# Patient Record
Sex: Male | Born: 1945 | Race: White | Hispanic: No | Marital: Married | Smoking: Former smoker
Health system: Southern US, Community
[De-identification: ages and names within clinical notes are randomized; demographics above are authoritative.]

## PROBLEM LIST (undated history)

## (undated) DIAGNOSIS — Z8042 Family history of malignant neoplasm of prostate: Secondary | ICD-10-CM

## (undated) DIAGNOSIS — D751 Secondary polycythemia: Secondary | ICD-10-CM

## (undated) DIAGNOSIS — I10 Essential (primary) hypertension: Secondary | ICD-10-CM

## (undated) DIAGNOSIS — F419 Anxiety disorder, unspecified: Secondary | ICD-10-CM

## (undated) DIAGNOSIS — Z973 Presence of spectacles and contact lenses: Secondary | ICD-10-CM

## (undated) DIAGNOSIS — M1990 Unspecified osteoarthritis, unspecified site: Secondary | ICD-10-CM

## (undated) DIAGNOSIS — Z8052 Family history of malignant neoplasm of bladder: Secondary | ICD-10-CM

## (undated) DIAGNOSIS — J439 Emphysema, unspecified: Secondary | ICD-10-CM

## (undated) DIAGNOSIS — C61 Malignant neoplasm of prostate: Secondary | ICD-10-CM

## (undated) DIAGNOSIS — C649 Malignant neoplasm of unspecified kidney, except renal pelvis: Secondary | ICD-10-CM

## (undated) DIAGNOSIS — Z974 Presence of external hearing-aid: Secondary | ICD-10-CM

## (undated) DIAGNOSIS — K5792 Diverticulitis of intestine, part unspecified, without perforation or abscess without bleeding: Secondary | ICD-10-CM

## (undated) HISTORY — DX: Essential (primary) hypertension: I10

## (undated) HISTORY — DX: Family history of malignant neoplasm of prostate: Z80.42

## (undated) HISTORY — DX: Family history of malignant neoplasm of bladder: Z80.52

## (undated) HISTORY — PX: COLONOSCOPY: SHX174

## (undated) HISTORY — DX: Malignant neoplasm of prostate: C61

## (undated) HISTORY — DX: Secondary polycythemia: D75.1

## (undated) HISTORY — DX: Diverticulitis of intestine, part unspecified, without perforation or abscess without bleeding: K57.92

---

## 1998-01-21 ENCOUNTER — Ambulatory Visit (HOSPITAL_COMMUNITY): Admission: RE | Admit: 1998-01-21 | Discharge: 1998-01-21 | Payer: Self-pay | Admitting: Family Medicine

## 1999-02-03 ENCOUNTER — Encounter: Admission: RE | Admit: 1999-02-03 | Discharge: 1999-02-03 | Payer: Self-pay | Admitting: Family Medicine

## 1999-02-05 ENCOUNTER — Encounter: Payer: Self-pay | Admitting: Family Medicine

## 1999-02-05 ENCOUNTER — Encounter: Admission: RE | Admit: 1999-02-05 | Discharge: 1999-02-05 | Payer: Self-pay | Admitting: Family Medicine

## 1999-02-07 ENCOUNTER — Encounter: Payer: Self-pay | Admitting: Family Medicine

## 1999-02-07 ENCOUNTER — Encounter: Admission: RE | Admit: 1999-02-07 | Discharge: 1999-02-07 | Payer: Self-pay | Admitting: Family Medicine

## 1999-06-30 ENCOUNTER — Encounter: Admission: RE | Admit: 1999-06-30 | Discharge: 1999-06-30 | Payer: Self-pay | Admitting: Neurosurgery

## 1999-06-30 ENCOUNTER — Encounter: Payer: Self-pay | Admitting: Neurosurgery

## 2004-03-16 HISTORY — PX: RADIOACTIVE SEED IMPLANT: SHX5150

## 2004-08-20 ENCOUNTER — Ambulatory Visit: Admission: RE | Admit: 2004-08-20 | Discharge: 2004-11-18 | Payer: Self-pay | Admitting: Radiation Oncology

## 2004-08-25 ENCOUNTER — Encounter: Admission: RE | Admit: 2004-08-25 | Discharge: 2004-08-25 | Payer: Self-pay | Admitting: Urology

## 2004-10-08 ENCOUNTER — Ambulatory Visit (HOSPITAL_BASED_OUTPATIENT_CLINIC_OR_DEPARTMENT_OTHER): Admission: RE | Admit: 2004-10-08 | Discharge: 2004-10-08 | Payer: Self-pay | Admitting: Urology

## 2004-10-08 ENCOUNTER — Ambulatory Visit (HOSPITAL_COMMUNITY): Admission: RE | Admit: 2004-10-08 | Discharge: 2004-10-08 | Payer: Self-pay | Admitting: Urology

## 2008-03-16 HISTORY — PX: HERNIA REPAIR: SHX51

## 2008-04-24 ENCOUNTER — Encounter: Admission: RE | Admit: 2008-04-24 | Discharge: 2008-04-24 | Payer: Self-pay | Admitting: General Surgery

## 2008-04-27 ENCOUNTER — Encounter (INDEPENDENT_AMBULATORY_CARE_PROVIDER_SITE_OTHER): Payer: Self-pay | Admitting: General Surgery

## 2008-04-27 ENCOUNTER — Ambulatory Visit (HOSPITAL_BASED_OUTPATIENT_CLINIC_OR_DEPARTMENT_OTHER): Admission: RE | Admit: 2008-04-27 | Discharge: 2008-04-27 | Payer: Self-pay | Admitting: General Surgery

## 2010-05-20 ENCOUNTER — Encounter (HOSPITAL_BASED_OUTPATIENT_CLINIC_OR_DEPARTMENT_OTHER): Payer: Federal, State, Local not specified - PPO | Admitting: Oncology

## 2010-05-20 ENCOUNTER — Other Ambulatory Visit: Payer: Self-pay | Admitting: Oncology

## 2010-05-20 ENCOUNTER — Encounter: Payer: Self-pay | Admitting: Oncology

## 2010-05-20 DIAGNOSIS — D751 Secondary polycythemia: Secondary | ICD-10-CM

## 2010-05-20 DIAGNOSIS — C61 Malignant neoplasm of prostate: Secondary | ICD-10-CM

## 2010-05-20 LAB — CBC WITH DIFFERENTIAL/PLATELET
BASO%: 0.9 % (ref 0.0–2.0)
Basophils Absolute: 0 10*3/uL (ref 0.0–0.1)
EOS%: 1.8 % (ref 0.0–7.0)
Eosinophils Absolute: 0.1 10*3/uL (ref 0.0–0.5)
HCT: 56.9 % — ABNORMAL HIGH (ref 38.4–49.9)
HGB: 19.4 g/dL — ABNORMAL HIGH (ref 13.0–17.1)
LYMPH%: 21.7 % (ref 14.0–49.0)
MCH: 29 pg (ref 27.2–33.4)
MCHC: 34.1 g/dL (ref 32.0–36.0)
MCV: 85.2 fL (ref 79.3–98.0)
MONO#: 0.3 10*3/uL (ref 0.1–0.9)
MONO%: 6.1 % (ref 0.0–14.0)
NEUT#: 3.4 10*3/uL (ref 1.5–6.5)
NEUT%: 69.5 % (ref 39.0–75.0)
Platelets: 151 10*3/uL (ref 140–400)
RBC: 6.68 10*6/uL — ABNORMAL HIGH (ref 4.20–5.82)
RDW: 13.2 % (ref 11.0–14.6)
WBC: 4.9 10*3/uL (ref 4.0–10.3)
lymph#: 1.1 10*3/uL (ref 0.9–3.3)

## 2010-05-20 LAB — CHCC SMEAR

## 2010-05-21 LAB — COMPREHENSIVE METABOLIC PANEL
ALT: 23 U/L (ref 0–53)
AST: 22 U/L (ref 0–37)
Alkaline Phosphatase: 53 U/L (ref 39–117)
BUN: 16 mg/dL (ref 6–23)
Calcium: 9.2 mg/dL (ref 8.4–10.5)
Chloride: 103 mEq/L (ref 96–112)
Creatinine, Ser: 1.1 mg/dL (ref 0.40–1.50)
Total Bilirubin: 0.7 mg/dL (ref 0.3–1.2)

## 2010-05-23 LAB — JAK-2 V617F

## 2010-06-10 ENCOUNTER — Encounter (HOSPITAL_BASED_OUTPATIENT_CLINIC_OR_DEPARTMENT_OTHER): Payer: Federal, State, Local not specified - PPO | Admitting: Oncology

## 2010-06-10 DIAGNOSIS — D751 Secondary polycythemia: Secondary | ICD-10-CM

## 2010-07-01 LAB — DIFFERENTIAL
Basophils Absolute: 0 10*3/uL (ref 0.0–0.1)
Basophils Relative: 0 % (ref 0–1)
Monocytes Relative: 9 % (ref 3–12)
Neutro Abs: 3.9 10*3/uL (ref 1.7–7.7)
Neutrophils Relative %: 68 % (ref 43–77)

## 2010-07-01 LAB — URINE MICROSCOPIC-ADD ON

## 2010-07-01 LAB — URINALYSIS, ROUTINE W REFLEX MICROSCOPIC
Leukocytes, UA: NEGATIVE
Nitrite: NEGATIVE
Specific Gravity, Urine: 1.016 (ref 1.005–1.030)
Urobilinogen, UA: 0.2 mg/dL (ref 0.0–1.0)

## 2010-07-01 LAB — COMPREHENSIVE METABOLIC PANEL
Alkaline Phosphatase: 53 U/L (ref 39–117)
BUN: 12 mg/dL (ref 6–23)
CO2: 26 mEq/L (ref 19–32)
GFR calc non Af Amer: >60 mL/min (ref >60)
Glucose, Bld: 102 mg/dL — ABNORMAL HIGH (ref 70–99)
Potassium: 3.9 mEq/L (ref 3.5–5.1)
Total Bilirubin: 1 mg/dL (ref 0.3–1.2)
Total Protein: 6.7 g/dL (ref 6.0–8.3)

## 2010-07-01 LAB — CBC
HCT: 50 % (ref 39.0–52.0)
Hemoglobin: 17.5 g/dL — ABNORMAL HIGH (ref 13.0–17.0)
RDW: 13 % (ref 11.5–15.5)

## 2010-07-29 NOTE — Op Note (Signed)
NAMEMOHIT, ZIRBES NO.:  [id number]   MEDICAL RECORD NO.:  [id number]          PATIENT TYPE:  AMB   LOCATION:  [facility]SC                          FACILITY:  [facility]   PHYSICIAN:  Angelia Mould. Derrell Lolling, M.D.DATE OF BIRTH:  1946-01-09   DATE OF PROCEDURE:  04/27/2008  DATE OF DISCHARGE:                               OPERATIVE REPORT   PREOPERATIVE DIAGNOSES:  1. Left inguinal hernia.  2. Large left hydrocele.   POSTOPERATIVE DIAGNOSES:  1. Left inguinal hernia.  2. Large left hydrocele.   OPERATIONS PERFORMED:  1. Repair of left inguinal hernia with UltraPro mesh Press photographer).  2. Left hydrocelectomy.   SURGEON:  Angelia Mould. Derrell Lolling, MD   OPERATIVE INDICATIONS:  This is a 65 year old white man who has had a  left inguinal hernia for years and also has had a very large left  hydrocele.  Recently the left inguinal hernia has been getting larger  and has become painful.  I was asked to evaluate him about 1 week ago.  On exam, he has a very large left hydrocele and also has a separate left  inguinal hernia that is reducible.  There are no other hernias that I  can detect elsewhere.  He was brought to the operating room electively.   OPERATIVE TECHNIQUE:  Following the induction of general endotracheal  anesthesia, the patient's lower abdomen, groin, and genitalia were  prepped and draped in sterile fashion.  Intravenous antibiotics were  given.  A surgical time-out was held identifying the correct patient,  correct procedure, and correct site.  Marcaine 0.5% with epinephrine was  used as a local infiltration anesthetic.  Transverse incision was made  overlying the left inguinal area.  Dissection was carried down through  the subcutaneous tissue exposing the aponeurosis of the external  oblique.  The external oblique was incised in the direction of its  fibers opening of the external inguinal ring.  The external oblique was  dissected away from the  underlying structures and self-retaining  retractors were placed.  There was a sensory nerve that  was intimately  involved with the cord structures, which was traced back and clamped  laterally, divided, and ligated with a 2-0 silk tie.   We then dissected all the cord structures free and encircled them with a  Penrose drain.  I found that he had a moderate-sized direct left  inguinal hernia, but there was no evidence of indirect hernia.   I dissected down toward the testicle until I could deliver the testicle  and a large hydrocele into the wound.  I opened up the wall of the  hydrocele sac and then carefully debrided as much of the sac as possible  and sent it for histology.  This was done with electrocautery and there  was really no bleeding.  I folded the remnants of the walls of the  hydrocele sac back around the cord structures and testicle and then  sewed them to each other with a running suture of 2-0 Vicryl.  The  testicle looked fine.  I returned the testicle  and the cord back to the  scrotum.  The wound was irrigated.  There was no bleeding.   The direct inguinal hernia was dissected away from the cord structures  and reduced.  The floor of the inguinal canal was oversewn with a  running suture of 2-0 Vicryl to hold the direct hernia reduced during  the repair.  I then skeletonized the cremasteric muscle fibers around  the cord and searched for an indirect sac and there was no evidence of  indirect sac.  With retraction, I could see the edge of the peritoneum  just barely at the level of the internal ring.  The floor of the  inguinal canal was repaired and reinforced with an onlay graft with  UltraPro polyester mesh.  A 3-inch x 6-inch piece of mesh was brought to  the operative field and trimmed at the corners to accommodate the wound.  The mesh was sutured in place with running sutures and interrupted  mattress sutures of 2-0 Prolene.  The mesh was sutured so as to   generously overlap the fascia at the pubic tubercle along the inguinal  ligament inferiorly.  Medially and superomedially about 6 or 7  interrupted mattress sutures of 2-0 Prolene were placed to fix the mesh.  Superolaterally, a running suture of 2-0 Prolene was used.  The mesh was  incised laterally so as to wrap around the cord structures at the  internal ring.  The tails of the mesh were overlapped laterally and the  suture lines completed.  Another mattress suture of 2-0 Prolene was  placed just lateral to the cord structures to tighten the mesh up around  the internal ring.  This provided a very secure repair both medial and  lateral to the internal ring, but allowed an adequate fingertip opening  for the cord structures.  The wound was irrigated with saline.  There  was absolutely no bleeding.  The external oblique was closed with a  running suture of 2-0 Vicryl placing the cord structures deep to the  external oblique.  The penis and scrotum were checked.  The left  testicle was completely reduced back into the bottom of the scrotum.  Scarpa's fascia was closed with 3-0 Vicryl sutures and the skin closed  with a running subcuticular suture of 4-0 Monocryl and Steri-Strips.  Clean bandages were placed and the patient was taken to recovery room in  stable condition.  Estimated blood loss was about 10-15 mL.   COMPLICATIONS:  None.   Sponge, needle, and instrument counts were correct.      Angelia Mould. Derrell Lolling, M.D.  Electronically Signed     HMI/MEDQ  D:  04/27/2008  T:  04/28/2008  Job:  [id number]   cc:   Windy Fast L. Earlene Plater, M.D.  [name] A. Nicholos Johns, M.D.

## 2010-08-01 NOTE — Op Note (Signed)
[name], [name]              ACCOUNT NO.:  [id number]   MEDICAL RECORD NO.:  [id number]          PATIENT TYPE:  AMB   LOCATION:  [facility]                         FACILITY:  [facility]   PHYSICIAN:  Ronald L. Earlene Plater, M.D.  DATE OF BIRTH:  March 30, 1945   DATE OF PROCEDURE:  10/08/2004  DATE OF DISCHARGE:                                 OPERATIVE REPORT   DIAGNOSIS:  Adenocarcinoma of the prostate.   OPERATIVE PROCEDURE:  Transperineal implantation of iodine 125 seeds  utilizing a robotic arm Nucletron and cystourethroscopy.   SURGEON:  Lucrezia Starch. Earlene Plater, M.D.   ASSISTANT:  Maryln Gottron, M.D.   ANESTHESIA:  General endotracheal.   ESTIMATED BLOOD LOSS:  15 cc.   TUBES:  16-French Foley.   COMPLICATIONS:  None.   A total of 23 needles were used to implant 45 seeds at 0.533 millicuries per  seed.   INDICATIONS:  Mr. Pratte is a very nice 65 year old white male who  presented with an elevated PSA of 4.97. He underwent ultrasound and biopsy  of the prostate which revealed a Gleason score of 6 which was 3 + 3  adenocarcinoma in 10% of biopsies from the right side of his prostate. He  has considered options carefully, and after understanding risks, benefits  and alternatives, he would like to proceed with radioactive seed  implantation. He has been properly simulated and properly informed.   PROCEDURE IN DETAIL:  The patient was placed in a supine position. After  proper LMA anesthesia, was placed in the dorsal lithotomy position, prepped  and draped with Betadine in a sterile fashion. A 16-French Foley catheter  was inserted, and the bladder was drained. The B&K transrectal ultrasound  probe was placed, and both axial and sagittal scans were utilized.  Intraoperative planning was performed. Those will be documented in the  record and augmentation of pre-implantation was performed. Two holding  needles were placed in unused coordinates, and the initial needle was used  in the  Nucletron planned implantation based upon this. Utilizing ultrasound  guidance in the sagittal plane, serial implantation was performed. A total  of 23 needles were utilized to implant 45 seeds at 0.533 millicuries per  seed. The automatic implantation device was utilized. Seeds were visualized,  and on ultrasound, it might be noted that two holding needles had been  placed in unused coordinates. There was some bone in the left side  anteriorly, and one needle as been documented in the chart had to be free  hand. Following implantation utilizing the grid system, the device was  removed. KUB was performed, and we were quite pleased with the localization  of the seeds. He was placed in the supine position after the wound had been  dressed sterilely. The 16-French Foley was removed and scanned for seeds,  and there were none within it. Flexible cystourethroscopy was performed. He  was  noted to have mild trilobar hypertrophy, smooth wall bladder, efflux of  clear urine noted bilaterally with normally placed ureteral orifices, and  there were no seeds or spacers in the bladder or the urethra. Cystoscope was  visually removed, and a new 16-French Foley catheter was passed. The patient  was taken to the recovery room, stable.       RLD/MEDQ  D:  10/08/2004  T:  10/08/2004  Job:  [id number]

## 2010-09-26 ENCOUNTER — Encounter (HOSPITAL_BASED_OUTPATIENT_CLINIC_OR_DEPARTMENT_OTHER): Payer: Federal, State, Local not specified - PPO | Admitting: Oncology

## 2010-09-26 ENCOUNTER — Other Ambulatory Visit: Payer: Self-pay | Admitting: Oncology

## 2010-09-26 DIAGNOSIS — D751 Secondary polycythemia: Secondary | ICD-10-CM

## 2010-09-26 LAB — CBC WITH DIFFERENTIAL/PLATELET
Basophils Absolute: 0 10*3/uL (ref 0.0–0.1)
EOS%: 2.1 % (ref 0.0–7.0)
HCT: 51.7 % — ABNORMAL HIGH (ref 38.4–49.9)
MCHC: 34.9 g/dL (ref 32.0–36.0)
MCV: 85.5 fL (ref 79.3–98.0)
MONO#: 0.5 10*3/uL (ref 0.1–0.9)
Platelets: 203 10*3/uL (ref 140–400)
RBC: 6.05 10*6/uL — ABNORMAL HIGH (ref 4.20–5.82)
lymph#: 1.3 10*3/uL (ref 0.9–3.3)

## 2011-02-07 ENCOUNTER — Telehealth: Payer: Self-pay | Admitting: Oncology

## 2011-02-07 NOTE — Telephone Encounter (Signed)
S/w the pt regarding his jan 2013 appts °

## 2011-03-30 ENCOUNTER — Encounter: Payer: Self-pay | Admitting: *Deleted

## 2011-04-02 ENCOUNTER — Ambulatory Visit: Payer: Federal, State, Local not specified - PPO | Admitting: Oncology

## 2011-04-02 ENCOUNTER — Other Ambulatory Visit: Payer: Federal, State, Local not specified - PPO | Admitting: Lab

## 2011-09-21 DIAGNOSIS — M67919 Unspecified disorder of synovium and tendon, unspecified shoulder: Secondary | ICD-10-CM | POA: Diagnosis not present

## 2011-11-02 ENCOUNTER — Other Ambulatory Visit: Payer: Self-pay | Admitting: Dermatology

## 2011-11-02 DIAGNOSIS — L570 Actinic keratosis: Secondary | ICD-10-CM | POA: Diagnosis not present

## 2011-11-02 DIAGNOSIS — L821 Other seborrheic keratosis: Secondary | ICD-10-CM | POA: Diagnosis not present

## 2011-11-02 DIAGNOSIS — D485 Neoplasm of uncertain behavior of skin: Secondary | ICD-10-CM | POA: Diagnosis not present

## 2011-11-02 DIAGNOSIS — L820 Inflamed seborrheic keratosis: Secondary | ICD-10-CM | POA: Diagnosis not present

## 2011-11-19 DIAGNOSIS — Z23 Encounter for immunization: Secondary | ICD-10-CM | POA: Diagnosis not present

## 2011-11-19 DIAGNOSIS — Z125 Encounter for screening for malignant neoplasm of prostate: Secondary | ICD-10-CM | POA: Diagnosis not present

## 2011-11-19 DIAGNOSIS — M545 Low back pain, unspecified: Secondary | ICD-10-CM | POA: Diagnosis not present

## 2011-11-19 DIAGNOSIS — E785 Hyperlipidemia, unspecified: Secondary | ICD-10-CM | POA: Diagnosis not present

## 2011-11-19 DIAGNOSIS — Z0000 Encounter for general adult medical examination without abnormal findings: Secondary | ICD-10-CM | POA: Diagnosis not present

## 2011-11-19 DIAGNOSIS — C61 Malignant neoplasm of prostate: Secondary | ICD-10-CM | POA: Diagnosis not present

## 2011-11-19 DIAGNOSIS — H9190 Unspecified hearing loss, unspecified ear: Secondary | ICD-10-CM | POA: Diagnosis not present

## 2011-11-19 DIAGNOSIS — I10 Essential (primary) hypertension: Secondary | ICD-10-CM | POA: Diagnosis not present

## 2011-11-19 DIAGNOSIS — Z136 Encounter for screening for cardiovascular disorders: Secondary | ICD-10-CM | POA: Diagnosis not present

## 2011-11-27 DIAGNOSIS — Z87891 Personal history of nicotine dependence: Secondary | ICD-10-CM | POA: Diagnosis not present

## 2011-12-04 DIAGNOSIS — H9319 Tinnitus, unspecified ear: Secondary | ICD-10-CM | POA: Diagnosis not present

## 2011-12-04 DIAGNOSIS — H903 Sensorineural hearing loss, bilateral: Secondary | ICD-10-CM | POA: Diagnosis not present

## 2011-12-11 DIAGNOSIS — H903 Sensorineural hearing loss, bilateral: Secondary | ICD-10-CM | POA: Diagnosis not present

## 2011-12-14 DIAGNOSIS — D235 Other benign neoplasm of skin of trunk: Secondary | ICD-10-CM | POA: Diagnosis not present

## 2011-12-14 DIAGNOSIS — L578 Other skin changes due to chronic exposure to nonionizing radiation: Secondary | ICD-10-CM | POA: Diagnosis not present

## 2011-12-31 DIAGNOSIS — Z23 Encounter for immunization: Secondary | ICD-10-CM | POA: Diagnosis not present

## 2012-01-01 DIAGNOSIS — M719 Bursopathy, unspecified: Secondary | ICD-10-CM | POA: Diagnosis not present

## 2012-01-07 DIAGNOSIS — M19019 Primary osteoarthritis, unspecified shoulder: Secondary | ICD-10-CM | POA: Diagnosis not present

## 2012-01-11 DIAGNOSIS — M67919 Unspecified disorder of synovium and tendon, unspecified shoulder: Secondary | ICD-10-CM | POA: Diagnosis not present

## 2012-01-11 DIAGNOSIS — M719 Bursopathy, unspecified: Secondary | ICD-10-CM | POA: Diagnosis not present

## 2012-01-14 ENCOUNTER — Other Ambulatory Visit: Payer: Self-pay | Admitting: Gastroenterology

## 2012-01-14 DIAGNOSIS — Z1211 Encounter for screening for malignant neoplasm of colon: Secondary | ICD-10-CM | POA: Diagnosis not present

## 2012-01-14 DIAGNOSIS — K5730 Diverticulosis of large intestine without perforation or abscess without bleeding: Secondary | ICD-10-CM | POA: Diagnosis not present

## 2012-01-14 DIAGNOSIS — D126 Benign neoplasm of colon, unspecified: Secondary | ICD-10-CM | POA: Diagnosis not present

## 2012-01-15 HISTORY — PX: SHOULDER ARTHROSCOPY: SHX128

## 2012-01-19 DIAGNOSIS — M719 Bursopathy, unspecified: Secondary | ICD-10-CM | POA: Diagnosis not present

## 2012-01-19 DIAGNOSIS — S43429A Sprain of unspecified rotator cuff capsule, initial encounter: Secondary | ICD-10-CM | POA: Diagnosis not present

## 2012-01-19 DIAGNOSIS — G8918 Other acute postprocedural pain: Secondary | ICD-10-CM | POA: Diagnosis not present

## 2012-01-19 DIAGNOSIS — M19019 Primary osteoarthritis, unspecified shoulder: Secondary | ICD-10-CM | POA: Diagnosis not present

## 2012-01-19 DIAGNOSIS — M24119 Other articular cartilage disorders, unspecified shoulder: Secondary | ICD-10-CM | POA: Diagnosis not present

## 2012-01-19 DIAGNOSIS — M67919 Unspecified disorder of synovium and tendon, unspecified shoulder: Secondary | ICD-10-CM | POA: Diagnosis not present

## 2012-01-29 DIAGNOSIS — M25519 Pain in unspecified shoulder: Secondary | ICD-10-CM | POA: Diagnosis not present

## 2012-01-29 DIAGNOSIS — M19019 Primary osteoarthritis, unspecified shoulder: Secondary | ICD-10-CM | POA: Diagnosis not present

## 2012-02-04 DIAGNOSIS — M25519 Pain in unspecified shoulder: Secondary | ICD-10-CM | POA: Diagnosis not present

## 2012-02-04 DIAGNOSIS — M19019 Primary osteoarthritis, unspecified shoulder: Secondary | ICD-10-CM | POA: Diagnosis not present

## 2012-02-15 DIAGNOSIS — M25519 Pain in unspecified shoulder: Secondary | ICD-10-CM | POA: Diagnosis not present

## 2012-02-15 DIAGNOSIS — M19019 Primary osteoarthritis, unspecified shoulder: Secondary | ICD-10-CM | POA: Diagnosis not present

## 2012-02-22 DIAGNOSIS — M19019 Primary osteoarthritis, unspecified shoulder: Secondary | ICD-10-CM | POA: Diagnosis not present

## 2012-02-29 DIAGNOSIS — M25519 Pain in unspecified shoulder: Secondary | ICD-10-CM | POA: Diagnosis not present

## 2012-02-29 DIAGNOSIS — M19019 Primary osteoarthritis, unspecified shoulder: Secondary | ICD-10-CM | POA: Diagnosis not present

## 2012-03-10 DIAGNOSIS — M19019 Primary osteoarthritis, unspecified shoulder: Secondary | ICD-10-CM | POA: Diagnosis not present

## 2012-03-14 DIAGNOSIS — M19019 Primary osteoarthritis, unspecified shoulder: Secondary | ICD-10-CM | POA: Diagnosis not present

## 2012-03-14 DIAGNOSIS — M25519 Pain in unspecified shoulder: Secondary | ICD-10-CM | POA: Diagnosis not present

## 2012-03-17 DIAGNOSIS — M25519 Pain in unspecified shoulder: Secondary | ICD-10-CM | POA: Diagnosis not present

## 2012-03-17 DIAGNOSIS — M19019 Primary osteoarthritis, unspecified shoulder: Secondary | ICD-10-CM | POA: Diagnosis not present

## 2012-03-21 DIAGNOSIS — M19019 Primary osteoarthritis, unspecified shoulder: Secondary | ICD-10-CM | POA: Diagnosis not present

## 2012-03-21 DIAGNOSIS — M25519 Pain in unspecified shoulder: Secondary | ICD-10-CM | POA: Diagnosis not present

## 2012-03-24 DIAGNOSIS — M25519 Pain in unspecified shoulder: Secondary | ICD-10-CM | POA: Diagnosis not present

## 2012-03-24 DIAGNOSIS — M19019 Primary osteoarthritis, unspecified shoulder: Secondary | ICD-10-CM | POA: Diagnosis not present

## 2012-03-28 DIAGNOSIS — M19019 Primary osteoarthritis, unspecified shoulder: Secondary | ICD-10-CM | POA: Diagnosis not present

## 2012-03-28 DIAGNOSIS — M25519 Pain in unspecified shoulder: Secondary | ICD-10-CM | POA: Diagnosis not present

## 2012-03-31 DIAGNOSIS — M19019 Primary osteoarthritis, unspecified shoulder: Secondary | ICD-10-CM | POA: Diagnosis not present

## 2012-03-31 DIAGNOSIS — M25519 Pain in unspecified shoulder: Secondary | ICD-10-CM | POA: Diagnosis not present

## 2012-04-04 DIAGNOSIS — M19019 Primary osteoarthritis, unspecified shoulder: Secondary | ICD-10-CM | POA: Diagnosis not present

## 2012-04-04 DIAGNOSIS — M25519 Pain in unspecified shoulder: Secondary | ICD-10-CM | POA: Diagnosis not present

## 2012-04-07 DIAGNOSIS — M19019 Primary osteoarthritis, unspecified shoulder: Secondary | ICD-10-CM | POA: Diagnosis not present

## 2012-04-07 DIAGNOSIS — M25519 Pain in unspecified shoulder: Secondary | ICD-10-CM | POA: Diagnosis not present

## 2012-04-25 DIAGNOSIS — M19019 Primary osteoarthritis, unspecified shoulder: Secondary | ICD-10-CM | POA: Diagnosis not present

## 2012-04-25 DIAGNOSIS — M75120 Complete rotator cuff tear or rupture of unspecified shoulder, not specified as traumatic: Secondary | ICD-10-CM | POA: Diagnosis not present

## 2012-05-02 DIAGNOSIS — M19019 Primary osteoarthritis, unspecified shoulder: Secondary | ICD-10-CM | POA: Diagnosis not present

## 2012-05-02 DIAGNOSIS — M75120 Complete rotator cuff tear or rupture of unspecified shoulder, not specified as traumatic: Secondary | ICD-10-CM | POA: Diagnosis not present

## 2012-05-09 DIAGNOSIS — M75120 Complete rotator cuff tear or rupture of unspecified shoulder, not specified as traumatic: Secondary | ICD-10-CM | POA: Diagnosis not present

## 2012-05-09 DIAGNOSIS — M25519 Pain in unspecified shoulder: Secondary | ICD-10-CM | POA: Diagnosis not present

## 2012-05-09 DIAGNOSIS — M7500 Adhesive capsulitis of unspecified shoulder: Secondary | ICD-10-CM | POA: Diagnosis not present

## 2012-05-16 DIAGNOSIS — M25519 Pain in unspecified shoulder: Secondary | ICD-10-CM | POA: Diagnosis not present

## 2012-05-16 DIAGNOSIS — S4980XA Other specified injuries of shoulder and upper arm, unspecified arm, initial encounter: Secondary | ICD-10-CM | POA: Diagnosis not present

## 2012-05-16 DIAGNOSIS — I10 Essential (primary) hypertension: Secondary | ICD-10-CM | POA: Diagnosis not present

## 2012-05-16 DIAGNOSIS — S46909A Unspecified injury of unspecified muscle, fascia and tendon at shoulder and upper arm level, unspecified arm, initial encounter: Secondary | ICD-10-CM | POA: Diagnosis not present

## 2012-05-16 DIAGNOSIS — E785 Hyperlipidemia, unspecified: Secondary | ICD-10-CM | POA: Diagnosis not present

## 2012-05-23 DIAGNOSIS — M25519 Pain in unspecified shoulder: Secondary | ICD-10-CM | POA: Diagnosis not present

## 2012-05-23 DIAGNOSIS — M7500 Adhesive capsulitis of unspecified shoulder: Secondary | ICD-10-CM | POA: Diagnosis not present

## 2012-05-30 DIAGNOSIS — M25519 Pain in unspecified shoulder: Secondary | ICD-10-CM | POA: Diagnosis not present

## 2012-05-30 DIAGNOSIS — M7500 Adhesive capsulitis of unspecified shoulder: Secondary | ICD-10-CM | POA: Diagnosis not present

## 2012-06-06 DIAGNOSIS — M7500 Adhesive capsulitis of unspecified shoulder: Secondary | ICD-10-CM | POA: Diagnosis not present

## 2012-06-06 DIAGNOSIS — M25519 Pain in unspecified shoulder: Secondary | ICD-10-CM | POA: Diagnosis not present

## 2012-06-20 DIAGNOSIS — M7500 Adhesive capsulitis of unspecified shoulder: Secondary | ICD-10-CM | POA: Diagnosis not present

## 2012-06-20 DIAGNOSIS — M25519 Pain in unspecified shoulder: Secondary | ICD-10-CM | POA: Diagnosis not present

## 2012-11-24 DIAGNOSIS — Z0000 Encounter for general adult medical examination without abnormal findings: Secondary | ICD-10-CM | POA: Diagnosis not present

## 2012-11-24 DIAGNOSIS — C61 Malignant neoplasm of prostate: Secondary | ICD-10-CM | POA: Diagnosis not present

## 2012-11-24 DIAGNOSIS — E785 Hyperlipidemia, unspecified: Secondary | ICD-10-CM | POA: Diagnosis not present

## 2012-11-24 DIAGNOSIS — I10 Essential (primary) hypertension: Secondary | ICD-10-CM | POA: Diagnosis not present

## 2012-12-16 DIAGNOSIS — Z23 Encounter for immunization: Secondary | ICD-10-CM | POA: Diagnosis not present

## 2013-05-22 DIAGNOSIS — M720 Palmar fascial fibromatosis [Dupuytren]: Secondary | ICD-10-CM | POA: Diagnosis not present

## 2013-05-22 DIAGNOSIS — E785 Hyperlipidemia, unspecified: Secondary | ICD-10-CM | POA: Diagnosis not present

## 2013-05-22 DIAGNOSIS — I10 Essential (primary) hypertension: Secondary | ICD-10-CM | POA: Diagnosis not present

## 2013-05-31 ENCOUNTER — Telehealth: Payer: Self-pay | Admitting: Oncology

## 2013-05-31 NOTE — Telephone Encounter (Signed)
C/D 05/31/13 for appt.06/15/13

## 2013-05-31 NOTE — Telephone Encounter (Signed)
S/W PATIENT AND GAVE NEW PATIENT APPT FOR 04/02 @ 10:30 W/DR. SHADAD.  REFERRING DR. [name] [name] DX- KNOWN H/O POLYCYTHEMIA VERA WELCOME PACKET MAILED.

## 2013-06-09 ENCOUNTER — Other Ambulatory Visit: Payer: Self-pay | Admitting: Oncology

## 2013-06-09 DIAGNOSIS — M779 Enthesopathy, unspecified: Secondary | ICD-10-CM | POA: Diagnosis not present

## 2013-06-09 DIAGNOSIS — M720 Palmar fascial fibromatosis [Dupuytren]: Secondary | ICD-10-CM | POA: Diagnosis not present

## 2013-06-09 DIAGNOSIS — D45 Polycythemia vera: Secondary | ICD-10-CM

## 2013-06-09 DIAGNOSIS — M19019 Primary osteoarthritis, unspecified shoulder: Secondary | ICD-10-CM | POA: Diagnosis not present

## 2013-06-13 ENCOUNTER — Other Ambulatory Visit: Payer: Self-pay | Admitting: Orthopedic Surgery

## 2013-06-15 ENCOUNTER — Encounter: Payer: Self-pay | Admitting: Oncology

## 2013-06-15 ENCOUNTER — Ambulatory Visit: Payer: Medicare Other

## 2013-06-15 ENCOUNTER — Other Ambulatory Visit: Payer: Self-pay | Admitting: *Deleted

## 2013-06-15 ENCOUNTER — Encounter (HOSPITAL_BASED_OUTPATIENT_CLINIC_OR_DEPARTMENT_OTHER): Payer: Self-pay | Admitting: *Deleted

## 2013-06-15 ENCOUNTER — Ambulatory Visit (HOSPITAL_BASED_OUTPATIENT_CLINIC_OR_DEPARTMENT_OTHER): Payer: Medicare Other | Admitting: Oncology

## 2013-06-15 ENCOUNTER — Other Ambulatory Visit (HOSPITAL_BASED_OUTPATIENT_CLINIC_OR_DEPARTMENT_OTHER): Payer: Medicare Other

## 2013-06-15 ENCOUNTER — Ambulatory Visit (HOSPITAL_BASED_OUTPATIENT_CLINIC_OR_DEPARTMENT_OTHER): Payer: Medicare Other

## 2013-06-15 ENCOUNTER — Telehealth: Payer: Self-pay | Admitting: Oncology

## 2013-06-15 VITALS — BP 118/76 | HR 85 | Temp 98.1°F | Resp 20

## 2013-06-15 VITALS — BP 140/86 | HR 73 | Temp 98.6°F | Resp 20 | Ht 69.0 in | Wt 185.9 lb

## 2013-06-15 DIAGNOSIS — D45 Polycythemia vera: Secondary | ICD-10-CM | POA: Diagnosis not present

## 2013-06-15 DIAGNOSIS — D751 Secondary polycythemia: Secondary | ICD-10-CM

## 2013-06-15 LAB — COMPREHENSIVE METABOLIC PANEL (CC13)
ALK PHOS: 68 U/L (ref 40–150)
ALT: 18 U/L (ref 0–55)
ANION GAP: 12 meq/L — AB (ref 3–11)
AST: 18 U/L (ref 5–34)
Albumin: 4.1 g/dL (ref 3.5–5.0)
BILIRUBIN TOTAL: 0.83 mg/dL (ref 0.20–1.20)
BUN: 16 mg/dL (ref 7.0–26.0)
CO2: 23 meq/L (ref 22–29)
CREATININE: 1.1 mg/dL (ref 0.7–1.3)
Calcium: 9.7 mg/dL (ref 8.4–10.4)
Chloride: 107 mEq/L (ref 98–109)
Glucose: 102 mg/dl (ref 70–140)
Potassium: 3.9 mEq/L (ref 3.5–5.1)
SODIUM: 142 meq/L (ref 136–145)
TOTAL PROTEIN: 7.5 g/dL (ref 6.4–8.3)

## 2013-06-15 LAB — CBC WITH DIFFERENTIAL/PLATELET
BASO%: 0.6 % (ref 0.0–2.0)
Basophils Absolute: 0 10*3/uL (ref 0.0–0.1)
EOS%: 1.7 % (ref 0.0–7.0)
Eosinophils Absolute: 0.1 10*3/uL (ref 0.0–0.5)
HCT: 61.4 % — ABNORMAL HIGH (ref 38.4–49.9)
HGB: 20.4 g/dL — ABNORMAL HIGH (ref 13.0–17.1)
LYMPH%: 21.8 % (ref 14.0–49.0)
MCH: 28.5 pg (ref 27.2–33.4)
MCHC: 33.2 g/dL (ref 32.0–36.0)
MCV: 85.6 fL (ref 79.3–98.0)
MONO#: 0.7 10*3/uL (ref 0.1–0.9)
MONO%: 11.7 % (ref 0.0–14.0)
NEUT#: 3.6 10*3/uL (ref 1.5–6.5)
NEUT%: 64.2 % (ref 39.0–75.0)
PLATELETS: 221 10*3/uL (ref 140–400)
RBC: 7.18 10*6/uL — AB (ref 4.20–5.82)
RDW: 14.2 % (ref 11.0–14.6)
WBC: 5.6 10*3/uL (ref 4.0–10.3)
lymph#: 1.2 10*3/uL (ref 0.9–3.3)

## 2013-06-15 NOTE — Progress Notes (Signed)
Please see consult note.  

## 2013-06-15 NOTE — Progress Notes (Signed)
1150-512 grams removed from left ac without difficulty over 10 minutes.  Pt ate snack and drink after procedure and has no complaints with phlebotomy.  Pt observed for 30 minutes post phlebotomy and is without complaints.  Phlebotomy discharge instructions reviewed with patient and his wife and they have no questions at this time.

## 2013-06-15 NOTE — Progress Notes (Signed)
Checked in new pt with no financial concerns. °

## 2013-06-15 NOTE — Progress Notes (Signed)
Will come by for ekg-had labs today [facility] when he had a ph lobotomy for polycythemia

## 2013-06-15 NOTE — Patient Instructions (Signed)

## 2013-06-15 NOTE — Progress Notes (Signed)
06/15/13 1650  OBSTRUCTIVE SLEEP APNEA  Have you ever been diagnosed with sleep apnea through a sleep study? No  Do you snore loudly (loud enough to be heard through closed doors)?  1  Do you often feel tired, fatigued, or sleepy during the daytime? 0  Has anyone observed you stop breathing during your sleep? 0  Do you have, or are you being treated for high blood pressure? 1  BMI more than 35 kg/m2? 0  Age over 67 years old? 1  Neck circumference greater than 40 cm/18 inches? 0  Gender: 1  Obstructive Sleep Apnea Score 4  Score 4 or greater  Results sent to PCP

## 2013-06-15 NOTE — Telephone Encounter (Signed)
gv pt appt schedule for june.  °

## 2013-06-15 NOTE — Consult Note (Signed)
Reason for Referral: Polycythemia.   HPI: This is a pleasant 68 year old gentleman referred to me for the evaluation of polycythemia. He is a gentleman I saw in the past over 3 years ago for the similar problem. At that time he presented with a hemoglobin of 18 and a normal platelet count and white cell count. His workup did not reveal a polycythemia vera with a negative JAK-2 mutation. After a short period of observation his hemoglobin was close to normal range. In the last few years, his hemoglobin was elevated again up to 20 detected by his primary care physician. He was sent to me for reevaluation. Clinically he continues to be asymptomatic at this point. Is that report any headaches blurred vision or double vision. Is that report any chest pain or difficulty breathing. Does not report any musculoskeletal complaints. He has not reported any recent hospitalizations or illnesses. He denies any toxic fumes exposures although he works around cars extensively in an open space. 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 It is unlikely that he has any exposure to carbon monoxide at home. He does report some occasional snoring at night time although it is unclear if he has any Episodes. He does not report any smoking or exposure to smoke.   Past Medical History  Diagnosis Date  . Polycythemia   . Prostate cancer   . Hypertension   . Inguinal hernia   . Diverticulitis   :  Past Surgical History  Procedure Laterality Date  . Radioactive seed implant  2006    hx of prostate  : Current Outpatient Prescriptions  Medication Sig Dispense Refill  . meloxicam (MOBIC) 15 MG tablet Take 15 mg by mouth as needed.       . pravastatin (PRAVACHOL) 40 MG tablet Take 40 mg by mouth daily.      . famotidine (PEPCID) 20 MG tablet Take 20 mg by mouth daily.       No current facility-administered medications for this visit.     No Known Allergies:  No family history on  file.:  History   Social History  . Marital Status: Married    Spouse Name: N/A    Number of Children: N/A  . Years of Education: N/A   Occupational History  . Not on file.   Social History Main Topics  . Smoking status: Not on file  . Smokeless tobacco: Not on file  . Alcohol Use: Not on file  . Drug Use: Not on file  . Sexual Activity: Not on file   Other Topics Concern  . Not on file   Social History Narrative  . No narrative on file  :  Constitutional: negative for anorexia, chills and fatigue Eyes: positive for redness, negative for icterus and irritation Ears, nose, mouth, throat, and face: negative for epistaxis, hoarseness and sore mouth Respiratory: negative for cough, dyspnea on exertion and hemoptysis Cardiovascular: negative for chest pain, fatigue and palpitations Gastrointestinal: negative for abdominal pain, constipation and vomiting Genitourinary:negative for dysuria, frequency and hematuria Integument/breast: negative for pruritus and rash Hematologic/lymphatic: negative for easy bruising, lymphadenopathy and petechiae Musculoskeletal:negative for arthralgias, back pain and muscle weakness Neurological: negative for coordination problems, dizziness and gait problems Behavioral/Psych: negative for depression and irritability Endocrine: negative for temperature intolerance Allergic/Immunologic: negative for anaphylaxis  Exam: ECOG 0 Blood pressure 140/86, pulse 73, temperature 98.6 F (37 C), temperature source Oral, resp. rate 20, height 5\' 9"  (1.753 m), weight  185 lb 14.4 oz (84.324 kg). General appearance: alert, cooperative and appears stated age. Increased redness and his facial skin and contact either. Head: Normocephalic, without obvious abnormality, atraumatic Throat: lips, mucosa, and tongue normal; teeth and gums normal Neck: no adenopathy, no carotid bruit, no JVD, supple, symmetrical, trachea midline and thyroid not enlarged, symmetric, no  tenderness/mass/nodules Back: symmetric, no curvature. ROM normal. No CVA tenderness. Resp: clear to auscultation bilaterally Chest wall: no tenderness Cardio: regular rate and rhythm, S1, S2 normal, no murmur, click, rub or gallop GI: soft, non-tender; bowel sounds normal; no masses,  no organomegaly Extremities: extremities normal, atraumatic, no cyanosis or edema Pulses: 2+ and symmetric Skin: Skin color, texture, turgor normal. No rashes or lesions.  Lymph nodes: Cervical, supraclavicular, and axillary nodes normal. Neurologic: Grossly normal   Recent Labs  06/15/13 1031  WBC 5.6  HGB 20.4*  HCT 61.4*  PLT 221     Assessment and Plan:   68 year old gentleman with polycythemia. The differential diagnosis was discussed today with the patient and his wife. This has been a long-standing issue with him from last 4 years at least. Primary causes such as polycythemia vera are always a consideration but I think it's less likely. His white cell count and platelets continue to be normal. His JAK-2 mutation has not been detected in the past. Secondary causes for polycythemia were discussed extensively. These would include smoke and toxic fumes exposures, hypoxia due to sleep apnea, dehydration, higher elevation which is unlikely along other causes. He does have possible stigmata of obstructive sleep apnea with a thick neck and snoring history but is unclear that is the problem at this time. He is not have any smoking history as of late. This toxic she'll exposure is very limited at this time.  A management standpoint, I recommended he resumes low-dose aspirin at 81 mg daily. I discussed with him the risks and benefits of a therapeutic phlebotomy in the fact that his hemoglobin above 20 at this time. He is agreeable to proceed and we will repeat his laboratory testing in about 2 months to determine if he needs another phlebotomy at that time. I will also recommend probably an evaluation for  obstructive sleep apnea at some point.

## 2013-06-19 LAB — ERYTHROPOIETIN: Erythropoietin: 9.7 m[IU]/mL (ref 2.6–18.5)

## 2013-06-20 NOTE — H&P (Signed)
  [name] is an 68 y.o. male.   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 He has had a history of prostate cancer treated by [name] 7 years ago with radiation seed placement.   Past Medical History  Diagnosis Date  . Polycythemia     phlebotomy 06/15/13  . Prostate cancer   . Hypertension   . Inguinal hernia   . Diverticulitis   . Wears glasses   . Wears hearing aid     both ears    Past Surgical History  Procedure Laterality Date  . Radioactive seed implant  2006    hx of prostate  . Hernia repair  2010    lt ing with lt hydrocele  . Shoulder arthroscopy  11/13    right  . Colonoscopy      History reviewed. No pertinent family history. Social History:  reports that he quit smoking about 43 years ago. He does not have any smokeless tobacco history on file. He reports that he drinks alcohol. He reports that he does not use illicit drugs.  Allergies: No Known Allergies  No prescriptions prior to admission    No results found for this or any previous visit (from the past 48 hour(s)).  No results found.   Pertinent items are noted in HPI.  Height 5\' 9"  (1.753 m), weight 83.915 kg (185 lb).  General appearance: alert Head: Normocephalic, without obvious abnormality Neck: supple, symmetrical, trachea midline Resp: clear to auscultation bilaterally Cardio: regular rate and rhythm GI: normal findings: bowel sounds normal Extremities: . He has had Dupuytren's palmar fibromatosis affecting the pretendinous fibers of the long, ring and small fingers on the right and significant involvement of his ring and small fingers on the left. He has significant flexion contractures of the ring and small fingers on  the left with a 25 degree flexion contracture of the left ring finger MP joint and a 55 degree flexion contracture of the left small finger MP joint. He has a 5 degree flexion contracture of the PIP joints. He has extensive nodular disease over the proximal phalangeal segment of the small finger. His neurovascular exam is intact.  Pulses: 2+ and symmetric Skin: normal Neurologic: Grossly normal    Assessment/Plan Impression:  Progressive Dupuytren's contracture of the left hand  Plan:To the OR for resection of Dupuytren';s of the left hand.The procedure, risks,benefits and post-op course were discussed with the patient at length and they were in agreement with the plan.  [name] understands that this is a relentless genetically driven condition and that the surgery is palliative.  He will develop more fibromatosis over time.  Risks of stiffness, flare reaction and need for possible future intervention have been detailed.   [name],[name] J 06/20/2013, 1:18 PM  H&P documentation: 06/22/2013  -History and Physical Reviewed  -Patient has been re-examined  -No change in the plan of care  Cammie Sickle, MD

## 2013-06-22 ENCOUNTER — Encounter (HOSPITAL_BASED_OUTPATIENT_CLINIC_OR_DEPARTMENT_OTHER)
Admission: RE | Disposition: A | Discharge status: Home / Self Care | Payer: Self-pay | Source: Ambulatory Visit | Attending: Orthopedic Surgery

## 2013-06-22 ENCOUNTER — Ambulatory Visit (HOSPITAL_BASED_OUTPATIENT_CLINIC_OR_DEPARTMENT_OTHER): Payer: Medicare Other | Admitting: Certified Registered"

## 2013-06-22 ENCOUNTER — Encounter (HOSPITAL_BASED_OUTPATIENT_CLINIC_OR_DEPARTMENT_OTHER): Payer: Medicare Other | Admitting: Certified Registered"

## 2013-06-22 ENCOUNTER — Ambulatory Visit (HOSPITAL_BASED_OUTPATIENT_CLINIC_OR_DEPARTMENT_OTHER)
Admission: RE | Admit: 2013-06-22 | Discharge: 2013-06-22 | Disposition: A | Discharge status: Home / Self Care | Payer: Medicare Other | Source: Ambulatory Visit | Attending: Orthopedic Surgery | Admitting: Orthopedic Surgery

## 2013-06-22 ENCOUNTER — Encounter (HOSPITAL_BASED_OUTPATIENT_CLINIC_OR_DEPARTMENT_OTHER): Payer: Self-pay | Admitting: Certified Registered"

## 2013-06-22 DIAGNOSIS — Z87891 Personal history of nicotine dependence: Secondary | ICD-10-CM | POA: Insufficient documentation

## 2013-06-22 DIAGNOSIS — Z8546 Personal history of malignant neoplasm of prostate: Secondary | ICD-10-CM | POA: Diagnosis not present

## 2013-06-22 DIAGNOSIS — D45 Polycythemia vera: Secondary | ICD-10-CM | POA: Insufficient documentation

## 2013-06-22 DIAGNOSIS — M720 Palmar fascial fibromatosis [Dupuytren]: Secondary | ICD-10-CM | POA: Insufficient documentation

## 2013-06-22 HISTORY — DX: Presence of external hearing-aid: Z97.4

## 2013-06-22 HISTORY — PX: DUPUYTREN CONTRACTURE RELEASE: SHX1478

## 2013-06-22 HISTORY — DX: Presence of spectacles and contact lenses: Z97.3

## 2013-06-22 LAB — POCT HEMOGLOBIN-HEMACUE
HEMOGLOBIN: 19.5 g/dL — AB (ref 13.0–17.0)
Hemoglobin: 17.4 g/dL — ABNORMAL HIGH (ref 13.0–17.0)

## 2013-06-22 SURGERY — RELEASE, DUPUYTREN CONTRACTURE
Anesthesia: General | Site: Hand | Laterality: Left

## 2013-06-22 MED ORDER — LIDOCAINE HCL (CARDIAC) 20 MG/ML IV SOLN
INTRAVENOUS | Status: DC | PRN
  Administered 2013-06-22: 80 mg via INTRAVENOUS

## 2013-06-22 MED ORDER — CEFAZOLIN SODIUM-DEXTROSE 2-3 GM-% IV SOLR
INTRAVENOUS | Status: AC
  Filled 2013-06-22: qty 50

## 2013-06-22 MED ORDER — ONDANSETRON HCL 4 MG/2ML IJ SOLN: 4.0000 mg | Freq: Once | INTRAMUSCULAR | Status: DC | PRN

## 2013-06-22 MED ORDER — PROPOFOL 10 MG/ML IV BOLUS
INTRAVENOUS | Status: DC | PRN
  Administered 2013-06-22: 200 mg via INTRAVENOUS
  Administered 2013-06-22: 100 mg via INTRAVENOUS

## 2013-06-22 MED ORDER — FENTANYL CITRATE 0.05 MG/ML IJ SOLN
INTRAMUSCULAR | Status: DC | PRN
  Administered 2013-06-22: 25 ug via INTRAVENOUS
  Administered 2013-06-22: 100 ug via INTRAVENOUS

## 2013-06-22 MED ORDER — OXYCODONE-ACETAMINOPHEN 5-325 MG PO TABS: ORAL_TABLET | ORAL | Status: DC

## 2013-06-22 MED ORDER — CEPHALEXIN 500 MG PO CAPS: 500.0000 mg | ORAL_CAPSULE | Freq: Three times a day (TID) | ORAL | Status: DC

## 2013-06-22 MED ORDER — ONDANSETRON HCL 4 MG/2ML IJ SOLN
INTRAMUSCULAR | Status: DC | PRN
  Administered 2013-06-22: 4 mg via INTRAVENOUS

## 2013-06-22 MED ORDER — OXYCODONE-ACETAMINOPHEN 5-325 MG PO TABS: 1.0000 | ORAL_TABLET | ORAL | Status: DC | PRN

## 2013-06-22 MED ORDER — SILVER SULFADIAZINE 1 % EX CREA
TOPICAL_CREAM | CUTANEOUS | Status: AC
  Filled 2013-06-22: qty 85

## 2013-06-22 MED ORDER — OXYCODONE HCL 5 MG/5ML PO SOLN: 5.0000 mg | Freq: Once | ORAL | Status: AC | PRN

## 2013-06-22 MED ORDER — CHLORHEXIDINE GLUCONATE 4 % EX LIQD: 60.0000 mL | Freq: Once | CUTANEOUS | Status: DC

## 2013-06-22 MED ORDER — MIDAZOLAM HCL 2 MG/2ML IJ SOLN
INTRAMUSCULAR | Status: AC
  Filled 2013-06-22: qty 2

## 2013-06-22 MED ORDER — SILVER SULFADIAZINE 1 % EX CREA
TOPICAL_CREAM | CUTANEOUS | Status: DC | PRN
  Administered 2013-06-22: 1 via TOPICAL

## 2013-06-22 MED ORDER — DEXAMETHASONE SODIUM PHOSPHATE 10 MG/ML IJ SOLN
INTRAMUSCULAR | Status: DC | PRN
  Administered 2013-06-22: 10 mg via INTRAVENOUS

## 2013-06-22 MED ORDER — MIDAZOLAM HCL 2 MG/2ML IJ SOLN: 1.0000 mg | INTRAMUSCULAR | Status: DC | PRN

## 2013-06-22 MED ORDER — CEPHALEXIN 500 MG PO CAPS
500.0000 mg | ORAL_CAPSULE | Freq: Three times a day (TID) | ORAL | Status: DC
Start: 2013-06-22 — End: 2013-09-01

## 2013-06-22 MED ORDER — LACTATED RINGERS IV SOLN
INTRAVENOUS | Status: DC
  Administered 2013-06-22 (×2): via INTRAVENOUS

## 2013-06-22 MED ORDER — CEFAZOLIN SODIUM-DEXTROSE 2-3 GM-% IV SOLR
2.0000 g | INTRAVENOUS | Status: AC
  Administered 2013-06-22: 2 g via INTRAVENOUS

## 2013-06-22 MED ORDER — LIDOCAINE HCL 2 % IJ SOLN
INTRAMUSCULAR | Status: DC | PRN
  Administered 2013-06-22: 6 mL

## 2013-06-22 MED ORDER — OXYCODONE HCL 5 MG PO TABS
ORAL_TABLET | ORAL | Status: AC
  Filled 2013-06-22: qty 1

## 2013-06-22 MED ORDER — HYDROMORPHONE HCL PF 1 MG/ML IJ SOLN
INTRAMUSCULAR | Status: AC
  Filled 2013-06-22: qty 1

## 2013-06-22 MED ORDER — EPHEDRINE SULFATE 50 MG/ML IJ SOLN
INTRAMUSCULAR | Status: DC | PRN
  Administered 2013-06-22 (×3): 10 mg via INTRAVENOUS

## 2013-06-22 MED ORDER — FENTANYL CITRATE 0.05 MG/ML IJ SOLN
INTRAMUSCULAR | Status: AC
  Filled 2013-06-22: qty 6

## 2013-06-22 MED ORDER — HYDROMORPHONE HCL PF 1 MG/ML IJ SOLN
0.2500 mg | INTRAMUSCULAR | Status: DC | PRN
  Administered 2013-06-22 (×2): 0.5 mg via INTRAVENOUS

## 2013-06-22 MED ORDER — FENTANYL CITRATE 0.05 MG/ML IJ SOLN: 50.0000 ug | INTRAMUSCULAR | Status: DC | PRN

## 2013-06-22 MED ORDER — OXYCODONE HCL 5 MG PO TABS
5.0000 mg | ORAL_TABLET | Freq: Once | ORAL | Status: AC | PRN
  Administered 2013-06-22: 5 mg via ORAL

## 2013-06-22 MED ORDER — MIDAZOLAM HCL 5 MG/5ML IJ SOLN
INTRAMUSCULAR | Status: DC | PRN
  Administered 2013-06-22: 2 mg via INTRAVENOUS

## 2013-06-22 SURGICAL SUPPLY — 59 items
BANDAGE ADH SHEER 1  50/CT (GAUZE/BANDAGES/DRESSINGS) IMPLANT
BANDAGE ELASTIC 3 VELCRO ST LF (GAUZE/BANDAGES/DRESSINGS) ×2 IMPLANT
BLADE MINI RND TIP GREEN BEAV (BLADE) ×3 IMPLANT
BLADE SURG 15 STRL LF DISP TIS (BLADE) ×1 IMPLANT
BLADE SURG 15 STRL SS (BLADE) ×3
BNDG CMPR 9X4 STRL LF SNTH (GAUZE/BANDAGES/DRESSINGS) ×1
BNDG CMPR MD 5X2 ELC HKLP STRL (GAUZE/BANDAGES/DRESSINGS) ×1
BNDG ELASTIC 2 VLCR STRL LF (GAUZE/BANDAGES/DRESSINGS) ×2 IMPLANT
BNDG ESMARK 4X9 LF (GAUZE/BANDAGES/DRESSINGS) ×3 IMPLANT
BNDG GAUZE ELAST 4 BULKY (GAUZE/BANDAGES/DRESSINGS) ×4 IMPLANT
BRUSH SCRUB EZ PLAIN DRY (MISCELLANEOUS) ×3 IMPLANT
CLOSURE WOUND 1/2 X4 (GAUZE/BANDAGES/DRESSINGS)
CORDS BIPOLAR (ELECTRODE) ×3 IMPLANT
COVER MAYO STAND STRL (DRAPES) ×3 IMPLANT
COVER TABLE BACK 60X90 (DRAPES) ×3 IMPLANT
CUFF TOURNIQUET SINGLE 18IN (TOURNIQUET CUFF) ×2 IMPLANT
DECANTER SPIKE VIAL GLASS SM (MISCELLANEOUS) IMPLANT
DRAPE EXTREMITY T 121X128X90 (DRAPE) ×3 IMPLANT
DRAPE SURG 17X23 STRL (DRAPES) ×3 IMPLANT
DRSG EMULSION OIL 3X3 NADH (GAUZE/BANDAGES/DRESSINGS) ×3 IMPLANT
GLOVE BIOGEL M STRL SZ7.5 (GLOVE) ×3 IMPLANT
GLOVE BIOGEL PI IND STRL 7.0 (GLOVE) IMPLANT
GLOVE BIOGEL PI IND STRL 7.5 (GLOVE) IMPLANT
GLOVE BIOGEL PI INDICATOR 7.0 (GLOVE) ×4
GLOVE BIOGEL PI INDICATOR 7.5 (GLOVE) ×2
GLOVE ECLIPSE 6.5 STRL STRAW (GLOVE) ×2 IMPLANT
GLOVE ORTHO TXT STRL SZ7.5 (GLOVE) ×3 IMPLANT
GLOVE SURG SS PI 7.5 STRL IVOR (GLOVE) ×2 IMPLANT
GOWN STRL REUS W/ TWL LRG LVL3 (GOWN DISPOSABLE) ×1 IMPLANT
GOWN STRL REUS W/ TWL XL LVL3 (GOWN DISPOSABLE) ×2 IMPLANT
GOWN STRL REUS W/TWL LRG LVL3 (GOWN DISPOSABLE) ×3
GOWN STRL REUS W/TWL XL LVL3 (GOWN DISPOSABLE) ×9
LOOP VESSEL MAXI BLUE (MISCELLANEOUS) IMPLANT
NDL HYPO 25X1 1.5 SAFETY (NEEDLE) IMPLANT
NEEDLE 27GAX1X1/2 (NEEDLE) ×2 IMPLANT
NEEDLE HYPO 25X1 1.5 SAFETY (NEEDLE) IMPLANT
NS IRRIG 1000ML POUR BTL (IV SOLUTION) ×3 IMPLANT
PACK BASIN DAY SURGERY FS (CUSTOM PROCEDURE TRAY) ×3 IMPLANT
PAD CAST 3X4 CTTN HI CHSV (CAST SUPPLIES) ×1 IMPLANT
PADDING CAST ABS 4INX4YD NS (CAST SUPPLIES)
PADDING CAST ABS COTTON 4X4 ST (CAST SUPPLIES) IMPLANT
PADDING CAST COTTON 3X4 STRL (CAST SUPPLIES) ×3
SLEEVE SCD COMPRESS KNEE MED (MISCELLANEOUS) ×3 IMPLANT
SPLINT PLASTER CAST XFAST 3X15 (CAST SUPPLIES) ×8 IMPLANT
SPLINT PLASTER XTRA FASTSET 3X (CAST SUPPLIES) ×16
SPONGE GAUZE 4X4 12PLY (GAUZE/BANDAGES/DRESSINGS) ×1 IMPLANT
STOCKINETTE 4X48 STRL (DRAPES) ×3 IMPLANT
STRIP CLOSURE SKIN 1/2X4 (GAUZE/BANDAGES/DRESSINGS) IMPLANT
SUT ETHILON 5 0 P 3 18 (SUTURE) ×4
SUT NYLON ETHILON 5-0 P-3 1X18 (SUTURE) ×2 IMPLANT
SUT SILK 4 0 PS 2 (SUTURE) ×3 IMPLANT
SUT VIC AB 4-0 P2 18 (SUTURE) IMPLANT
SYR 3ML 23GX1 SAFETY (SYRINGE) IMPLANT
SYR BULB 3OZ (MISCELLANEOUS) ×3 IMPLANT
SYR CONTROL 10ML LL (SYRINGE) ×2 IMPLANT
TOWEL OR 17X24 6PK STRL BLUE (TOWEL DISPOSABLE) ×4 IMPLANT
TOWEL OR NON WOVEN STRL DISP B (DISPOSABLE) ×3 IMPLANT
TRAY DSU PREP LF (CUSTOM PROCEDURE TRAY) ×3 IMPLANT
UNDERPAD 30X30 INCONTINENT (UNDERPADS AND DIAPERS) ×3 IMPLANT

## 2013-06-22 NOTE — Op Note (Signed)
[id number] 

## 2013-06-22 NOTE — Transfer of Care (Signed)
Immediate Anesthesia Transfer of Care Note  Patient: [name]  Procedure(s) Performed: Procedure(s): EXCISION LEFT DUPUYTRENS RING AND SMALL FINGERS (Left)  Patient Location: PACU  Anesthesia Type:General  Level of Consciousness: awake, alert  and oriented  Airway & Oxygen Therapy: Patient Spontanous Breathing and Patient connected to face mask oxygen  Post-op Assessment: Report given to PACU RN, Post -op Vital signs reviewed and stable and Patient moving all extremities  Post vital signs: Reviewed and stable  Complications: No apparent anesthesia complications

## 2013-06-22 NOTE — Op Note (Signed)
NAMETREVAUGHN, SCHEAR NO.:  [id number]  MEDICAL RECORD NO.:  [id number]  LOCATION:                                 FACILITY:  PHYSICIAN:  [name], M.D.      DATE OF BIRTH:  DATE OF PROCEDURE:  06/22/2013 DATE OF DISCHARGE:                              OPERATIVE REPORT   PREOPERATIVE DIAGNOSIS:  Progressive left palm Dupuytren's palmar fibromatosis with involvement of pretendinous fibers to ring and small fingers with extensive nodular disease in the small finger, requiring dissection followed by V-Y advancement flaps.  POSTOPERATIVE DIAGNOSIS:  Progressive left palm Dupuytren's palmar fibromatosis with involvement of pretendinous fibers to ring and small fingers with extensive nodular disease in the small finger, requiring dissection followed by V-Y advancement flaps.  OPERATION: 1. Resection of Dupuytren's palmar fibromatosis from left palm and     small finger with V-Y advancement flap closure for skin     lengthening. 2. Resection of Dupuytren's palmar fibromatosis from pretendinous     fibers of palm to ring finger, with dissection of proximal     phalangeal segment of left ring finger.  OPERATING SURGEON:  [name]. [name] Gillham, MD.  ASSISTANT:  [name] Lente Dasnoit, PA-C  Reyes:  General by LMA.  SUPERVISING ANESTHESIOLOGIST:  [name], M.D.  INDICATIONS:  [name] is a 68 year old gentleman referred through the courtesy of his primary care physician, Dr. Maury Reyes, for evaluation and management of progressive Dupuytren palmar fibromatosis.  Clinical examination revealed significant flexion contractures of the ring and small finger metacarpophalangeal joints of the left hand and early flexion contracture of the small finger PIP joint.  He had nodular disease of the palm, skin pitting and significant impairment of extension.  He could not get his palm flat on the table.  We had detailed informed consent at the office prior  to surgery.  We advised him that Dupuytren's palmar fibromatosis is a genetically driven condition.  We cannot cure this with surgery.  However, surgery accomplishes palliation of his flexion contractures and allows improved range of motion and positioning of the fingers.  He understands that over time, he will develop other issues including hypertrophic scars, potentially a recurrence in the operated fingers and possible development of Dupuytren's palmar fibromatosis in other segments of his hands right and left.  After detailed informed consent, he was brought to the operating room at this time.  Preoperatively, he was interviewed by Dr. Al Reyes of Reyes.  General Reyes by LMA technique was recommended and accepted.  We anticipate local Reyes postop for comfort.  His left hand was marked per protocol in the holding area with the marking pen.  PROCEDURE:  [name] was transferred to room 1 of the [facility] and placed in supine position upon the operating table.  Following induction of general Reyes by LMA technique under Dr. Al Reyes direct supervision, a 2 g of Ancef administered as an IV prophylactic antibiotic.  Routine Betadine scrub and paint of the left upper extremity was accomplished followed by application of sterile stockinette and impervious arthroscopy drapes.  A Esmarch bandage used to exsanguinate the left arm by inflation of the arterial tourniquet  on the proximal brachium to 220 mmHg.  Following routine surgical time-out, a Brunner zigzag incision were planned followed by a sharp skin incision.  The Dupuytren's pathologic fascia was carefully dissected off the deep side of the dermis exposing the pretendinous fibers to the long and ring fingers.  Care was taken to identify the common digital vessels and nerves, followed by meticulous dissection of all the pathologic fascia from the palm including the pretendinous fibers and the  hypothenar fascia followed by excision of complex spiral band on the ulnar aspect of the small finger and removal of a large nodule in the lateral fascial sheet of the small finger proximal phalangeal segment.  Diseased fascia was noted in the web between the ring and small fingers with involvement of the natatory ligaments.  All pathologic fascia was excised followed by demonstration of hyperextension of the MP joints of at least 40 degrees.  We could fully extend the IP joints.  Care was taken to meticulously preserve the named neurovascular structures.  The wound was irrigated with sterile saline followed by hemostasis with bipolar cautery.  The tourniquet was released and hemostasis achieved by direct pressure.  Continued bipolar cautery with saline and wound closure.  The skin on the palm surface of the small finger was advanced by V-Y advancement flap technique.  Corner sutures of 5-0 nylon were employed as well as interrupted sutures of 5-0 nylon.  There were no apparent complications.  [name] was placed in a compressive dressing with sterile gauze, sterile Kerlix, sterile Webril, and a volar plaster splint maintaining full extension of the long ring and small fingers.  He has been encouraged to elevate his hand for the next 48 hours.  He will be provided postoperative prescriptions for pain including Percocet 5/325, 1 or 2 p.o. q.4-6 hours p.r.n. pain, 30 tablets without refill. Also, he is provided Keflex 500 mg 1 p.o. q.8 hours x4 days as a prophylactic antibiotic.     [name] [name], M.D.     RVS/MEDQ  D:  06/22/2013  T:  06/22/2013  Job:  [id number]  cc:   [name], M.D.

## 2013-06-22 NOTE — Anesthesia Postprocedure Evaluation (Signed)
  Anesthesia Post-op Note  Patient: [name]  Procedure(s) Performed: Procedure(s): EXCISION LEFT DUPUYTRENS RING AND SMALL FINGERS (Left)  Patient Location: PACU  Anesthesia Type:General  Level of Consciousness: awake, alert  and oriented  Airway and Oxygen Therapy: Patient Spontanous Breathing and Patient connected to face mask oxygen  Post-op Pain: mild  Post-op Assessment: Post-op Vital signs reviewed  Post-op Vital Signs: Reviewed  Last Vitals:  Filed Vitals:   06/22/13 1115  BP: 128/76  Pulse: 87  Temp:   Resp: 10    Complications: No apparent anesthesia complications

## 2013-06-22 NOTE — Discharge Instructions (Addendum)

## 2013-06-22 NOTE — Brief Op Note (Signed)
06/22/2013  10:55 AM  PATIENT:  Grayland Jack  68 y.o. male  PRE-OPERATIVE DIAGNOSIS:  LEFT DUPUYTRENS  POST-OPERATIVE DIAGNOSIS:  LEFT DUPUYTRENS  PROCEDURE:  Procedure(s): EXCISION LEFT DUPUYTRENS RING AND SMALL FINGERS (Left)  SURGEON:  Surgeon(s) and Role:    * Cammie Sickle., MD - Primary  PHYSICIAN ASSISTANT: Kathyrn Sheriff.A-C    ANESTHESIA:   general  EBL:  Total I/O In: 1600 [I.V.:1600] Out: -   BLOOD ADMINISTERED:none  DRAINS: none   LOCAL MEDICATIONS USED:  XYLOCAINE   SPECIMEN:  Biopsy / Limited Resection  DISPOSITION OF SPECIMEN:  PATHOLOGY  COUNTS:  YES  TOURNIQUET:   Total Tourniquet Time Documented: Upper Arm (Left) - 49 minutes Total: Upper Arm (Left) - 49 minutes   DICTATION: .Other Dictation: Dictation Number [id number]  PLAN OF CARE: Discharge to home after PACU  PATIENT DISPOSITION:  PACU - hemodynamically stable.   Delay start of Pharmacological VTE agent (>24hrs) due to surgical blood loss or risk of bleeding: not applicable

## 2013-06-22 NOTE — Anesthesia Preprocedure Evaluation (Signed)
Anesthesia Evaluation  Patient identified by MRN, date of birth, ID band Patient awake    Reviewed: Allergy & Precautions, H&P , NPO status , Patient's Chart, lab work & pertinent test results  Airway Mallampati: I  TM Distance: >3 FB Neck ROM: Full    Dental  (+) Teeth Intact, Dental Advisory Given   Pulmonary former smoker,  breath sounds clear to auscultation        Cardiovascular hypertension, Pt. on medications Rhythm:Regular Rate:Normal     Neuro/Psych    GI/Hepatic   Endo/Other    Renal/GU      Musculoskeletal   Abdominal   Peds  Hematology   Anesthesia Other Findings   Reproductive/Obstetrics                             Anesthesia Physical Anesthesia Plan  ASA: II  Anesthesia Plan: General   Post-op Pain Management:    Induction: Intravenous  Airway Management Planned: LMA  Additional Equipment:   Intra-op Plan:   Post-operative Plan: Extubation in OR  Informed Consent: I have reviewed the patients History and Physical, chart, labs and discussed the procedure including the risks, benefits and alternatives for the proposed anesthesia with the patient or authorized representative who has indicated his/her understanding and acceptance.   Dental advisory given  Plan Discussed with: CRNA, Anesthesiologist and Surgeon  Anesthesia Plan Comments:         Anesthesia Quick Evaluation  

## 2013-06-22 NOTE — Anesthesia Procedure Notes (Signed)
Procedure Name: LMA Insertion Date/Time: 06/22/2013 9:30 AM Performed by: Baxter Flattery Pre-anesthesia Checklist: Patient identified, Emergency Drugs available, Suction available and Patient being monitored Patient Re-evaluated:Patient Re-evaluated prior to inductionOxygen Delivery Method: Circle System Utilized Preoxygenation: Pre-oxygenation with 100% oxygen Intubation Type: IV induction Ventilation: Mask ventilation without difficulty LMA: LMA inserted LMA Size: 5.0 Number of attempts: 1 Airway Equipment and Method: bite block Placement Confirmation: positive ETCO2 and breath sounds checked- equal and bilateral Tube secured with: Tape Dental Injury: Teeth and Oropharynx as per pre-operative assessment

## 2013-06-23 ENCOUNTER — Encounter (HOSPITAL_BASED_OUTPATIENT_CLINIC_OR_DEPARTMENT_OTHER): Payer: Self-pay | Admitting: Orthopedic Surgery

## 2013-09-01 ENCOUNTER — Other Ambulatory Visit: Payer: Self-pay | Admitting: *Deleted

## 2013-09-01 ENCOUNTER — Encounter: Payer: Self-pay | Admitting: Oncology

## 2013-09-01 ENCOUNTER — Other Ambulatory Visit (HOSPITAL_BASED_OUTPATIENT_CLINIC_OR_DEPARTMENT_OTHER): Payer: Medicare Other

## 2013-09-01 ENCOUNTER — Telehealth: Payer: Self-pay | Admitting: Oncology

## 2013-09-01 ENCOUNTER — Ambulatory Visit (HOSPITAL_BASED_OUTPATIENT_CLINIC_OR_DEPARTMENT_OTHER): Payer: Medicare Other | Admitting: Oncology

## 2013-09-01 VITALS — BP 125/82 | HR 73 | Temp 98.1°F | Resp 18 | Ht 69.0 in | Wt 184.5 lb

## 2013-09-01 DIAGNOSIS — D45 Polycythemia vera: Secondary | ICD-10-CM

## 2013-09-01 DIAGNOSIS — D751 Secondary polycythemia: Secondary | ICD-10-CM

## 2013-09-01 LAB — CBC WITH DIFFERENTIAL/PLATELET
BASO%: 0.5 % (ref 0.0–2.0)
BASOS ABS: 0 10*3/uL (ref 0.0–0.1)
EOS%: 0.9 % (ref 0.0–7.0)
Eosinophils Absolute: 0.1 10*3/uL (ref 0.0–0.5)
HCT: 57.4 % — ABNORMAL HIGH (ref 38.4–49.9)
HEMOGLOBIN: 19.1 g/dL — AB (ref 13.0–17.1)
LYMPH%: 14.5 % (ref 14.0–49.0)
MCH: 28.2 pg (ref 27.2–33.4)
MCHC: 33.3 g/dL (ref 32.0–36.0)
MCV: 84.9 fL (ref 79.3–98.0)
MONO#: 0.8 10*3/uL (ref 0.1–0.9)
MONO%: 10.7 % (ref 0.0–14.0)
NEUT#: 5.4 10*3/uL (ref 1.5–6.5)
NEUT%: 73.4 % (ref 39.0–75.0)
Platelets: 183 10*3/uL (ref 140–400)
RBC: 6.77 10*6/uL — ABNORMAL HIGH (ref 4.20–5.82)
RDW: 13.9 % (ref 11.0–14.6)
WBC: 7.4 10*3/uL (ref 4.0–10.3)
lymph#: 1.1 10*3/uL (ref 0.9–3.3)

## 2013-09-01 LAB — COMPREHENSIVE METABOLIC PANEL (CC13)
ALBUMIN: 4 g/dL (ref 3.5–5.0)
ALT: 13 U/L (ref 0–55)
ANION GAP: 10 meq/L (ref 3–11)
AST: 16 U/L (ref 5–34)
Alkaline Phosphatase: 59 U/L (ref 40–150)
BUN: 17.8 mg/dL (ref 7.0–26.0)
CO2: 23 meq/L (ref 22–29)
Calcium: 9.4 mg/dL (ref 8.4–10.4)
Chloride: 108 mEq/L (ref 98–109)
Creatinine: 1.1 mg/dL (ref 0.7–1.3)
Glucose: 105 mg/dl (ref 70–140)
POTASSIUM: 3.9 meq/L (ref 3.5–5.1)
Sodium: 140 mEq/L (ref 136–145)
Total Bilirubin: 0.81 mg/dL (ref 0.20–1.20)
Total Protein: 7.3 g/dL (ref 6.4–8.3)

## 2013-09-01 NOTE — Progress Notes (Signed)
Hematology and Oncology Follow Up Visit  [name] [id number] [birth year] 67 y.o. 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 The diagnosis dates back to at least 2011.   Prior Therapy: Status post therapeutic phlebotomy in the past most recently done in April 2015.  Current therapy: Observation and surveillance.  Interim History:  [name] presents today for a followup visit. Since the last visit in April of 2015, he underwent a therapeutic phlebotomy which he tolerated without any major complications. He did develop hypotension and some dizziness. Does have resolved rather quickly. He did not really notice any improvement in his symptoms afterwards. He was also noted some hypotension have also resolved. He did have a cooperation on 06/22/2013 and his hemoglobin was checked at that time was around 19 and repeated and was found to be 17. Most recently, he reports no headaches or blurry vision or double vision. Did not report any neurological symptoms or syncope. Is not reporting any chest pain shortness of breath cough or hemoptysis. He does not report any dyspnea on exertion or angina. Is not report any nausea or vomiting or abdominal pain. He does not report any genitourinary complaints. Rest of his review of systems unremarkable.  Medications: I have reviewed the patient's current medications.  Current Outpatient Prescriptions  Medication Sig Dispense Refill  . aspirin 81 MG tablet Take 81 mg by mouth daily.      . famotidine (PEPCID) 20 MG tablet Take 20 mg by mouth daily.      Marland Kitchen losartan-hydrochlorothiazide (HYZAAR) 100-25 MG per tablet Take 1 tablet by mouth daily.      . meloxicam (MOBIC) 15 MG tablet Take 15 mg by mouth as needed.       . pravastatin (PRAVACHOL) 40 MG tablet Take 40 mg by mouth daily.      Marland Kitchen oxyCODONE-acetaminophen  (PERCOCET/ROXICET) 5-325 MG per tablet Take one or two tablets every 4 to 6 hours a needed for pain  30 tablet  0   No current facility-administered medications for this visit.     Allergies: No Known Allergies  Past Medical History, Surgical history, Social history, and Family History were reviewed and updated.   Blood pressure 125/82, pulse 73, temperature 98.1 F (36.7 C), temperature source Oral, resp. rate 18, height 5\' 9"  (1.753 m), weight 184 lb 8 oz (83.689 kg), SpO2 99.00%. ECOG: 0 General appearance: alert and cooperative Head: Normocephalic, without obvious abnormality Neck: no adenopathy Lymph nodes: Cervical, supraclavicular, and axillary nodes normal. Heart:regular rate and rhythm, S1, S2 normal, no murmur, click, rub or gallop Lung:chest clear, no wheezing, rales, normal symmetric air entry Abdomin: soft, non-tender, without masses or organomegaly EXT:no erythema, induration, or nodules   Lab Results: Lab Results  Component Value Date   WBC 7.4 09/01/2013   HGB 19.1* 09/01/2013   HCT 57.4* 09/01/2013   MCV 84.9 09/01/2013   PLT 183 09/01/2013     Chemistry      Component Value Date/Time   NA 142 06/15/2013 1031   NA 138 05/20/2010 1035   K 3.9 06/15/2013 1031   K 4.0 05/20/2010 1035   CL 103 05/20/2010 1035   CO2 23 06/15/2013 1031   CO2 21 05/20/2010 1035   BUN 16.0 06/15/2013 1031   BUN 16 05/20/2010 1035   CREATININE 1.1 06/15/2013 1031   CREATININE 1.10 05/20/2010 1035      Component Value  Date/Time   CALCIUM 9.7 06/15/2013 1031   CALCIUM 9.2 05/20/2010 1035   ALKPHOS 68 06/15/2013 1031   ALKPHOS 53 05/20/2010 1035   AST 18 06/15/2013 1031   AST 22 05/20/2010 1035   ALT 18 06/15/2013 1031   ALT 23 05/20/2010 1035   BILITOT 0.83 06/15/2013 1031   BILITOT 0.7 05/20/2010 1035         Impression and Plan:  68 year old gentleman with the following issues:  1. 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 Polycythemia vera is less likely but is also a consideration. He has a  negative jack 2 mutation and a normal white cell count and platelets. His hemoglobin was reviewed today and he is asymptomatic. The risks and benefits of repeating phlebotomy at this point were discussed and we have elected to proceed with observation for the time being. I will repeat his laboratory testing in about 3-4 months, and if his hemoglobin rises again of 21 he has symptoms we will perform phlebotomy at this time. He prefers not to have an Allis needed to as he did not really feel better afterwards and does not want to have any further cardiovascular complications.  2. Thrombosis prophylaxis: Continue be a low-dose aspirin without any bleeding issues.   [facility], MD 6/19/201512:03 PM

## 2013-09-01 NOTE — Telephone Encounter (Signed)
gv adn printed appt sched and avs for pt for OCT....sed added tx. °

## 2013-11-27 DIAGNOSIS — M25519 Pain in unspecified shoulder: Secondary | ICD-10-CM | POA: Diagnosis not present

## 2013-11-27 DIAGNOSIS — Z0000 Encounter for general adult medical examination without abnormal findings: Secondary | ICD-10-CM | POA: Diagnosis not present

## 2013-11-27 DIAGNOSIS — C61 Malignant neoplasm of prostate: Secondary | ICD-10-CM | POA: Diagnosis not present

## 2013-11-27 DIAGNOSIS — Z23 Encounter for immunization: Secondary | ICD-10-CM | POA: Diagnosis not present

## 2013-11-27 DIAGNOSIS — Z1331 Encounter for screening for depression: Secondary | ICD-10-CM | POA: Diagnosis not present

## 2013-11-27 DIAGNOSIS — E785 Hyperlipidemia, unspecified: Secondary | ICD-10-CM | POA: Diagnosis not present

## 2013-11-27 DIAGNOSIS — I10 Essential (primary) hypertension: Secondary | ICD-10-CM | POA: Diagnosis not present

## 2013-11-28 ENCOUNTER — Encounter: Payer: Self-pay | Admitting: *Deleted

## 2013-12-19 ENCOUNTER — Telehealth: Payer: Self-pay | Admitting: Oncology

## 2013-12-19 NOTE — Telephone Encounter (Signed)
Pt confirmed time change per provider sch..... KJ

## 2013-12-22 ENCOUNTER — Ambulatory Visit (HOSPITAL_BASED_OUTPATIENT_CLINIC_OR_DEPARTMENT_OTHER): Payer: Medicare Other | Admitting: Oncology

## 2013-12-22 ENCOUNTER — Telehealth: Payer: Self-pay | Admitting: Oncology

## 2013-12-22 ENCOUNTER — Encounter: Payer: Self-pay | Admitting: Oncology

## 2013-12-22 ENCOUNTER — Other Ambulatory Visit (HOSPITAL_BASED_OUTPATIENT_CLINIC_OR_DEPARTMENT_OTHER): Payer: Medicare Other

## 2013-12-22 VITALS — BP 121/79 | HR 70 | Temp 98.4°F | Resp 17 | Ht 69.0 in | Wt 185.6 lb

## 2013-12-22 DIAGNOSIS — D751 Secondary polycythemia: Secondary | ICD-10-CM

## 2013-12-22 DIAGNOSIS — D45 Polycythemia vera: Secondary | ICD-10-CM

## 2013-12-22 LAB — CBC WITH DIFFERENTIAL/PLATELET
BASO%: 0.8 % (ref 0.0–2.0)
BASOS ABS: 0.1 10*3/uL (ref 0.0–0.1)
EOS%: 3.7 % (ref 0.0–7.0)
Eosinophils Absolute: 0.2 10*3/uL (ref 0.0–0.5)
HCT: 58.1 % — ABNORMAL HIGH (ref 38.4–49.9)
HGB: 19 g/dL — ABNORMAL HIGH (ref 13.0–17.1)
LYMPH%: 20.1 % (ref 14.0–49.0)
MCH: 27.5 pg (ref 27.2–33.4)
MCHC: 32.7 g/dL (ref 32.0–36.0)
MCV: 84 fL (ref 79.3–98.0)
MONO#: 0.7 10*3/uL (ref 0.1–0.9)
MONO%: 10.4 % (ref 0.0–14.0)
NEUT#: 4.3 10*3/uL (ref 1.5–6.5)
NEUT%: 65 % (ref 39.0–75.0)
Platelets: 217 10*3/uL (ref 140–400)
RBC: 6.92 10*6/uL — ABNORMAL HIGH (ref 4.20–5.82)
RDW: 14.3 % (ref 11.0–14.6)
WBC: 6.6 10*3/uL (ref 4.0–10.3)
lymph#: 1.3 10*3/uL (ref 0.9–3.3)

## 2013-12-22 LAB — COMPREHENSIVE METABOLIC PANEL (CC13)
ALBUMIN: 3.7 g/dL (ref 3.5–5.0)
ALK PHOS: 66 U/L (ref 40–150)
ALT: 15 U/L (ref 0–55)
AST: 19 U/L (ref 5–34)
Anion Gap: 7 mEq/L (ref 3–11)
BUN: 18.2 mg/dL (ref 7.0–26.0)
CO2: 28 mEq/L (ref 22–29)
Calcium: 9.5 mg/dL (ref 8.4–10.4)
Chloride: 106 mEq/L (ref 98–109)
Creatinine: 1.1 mg/dL (ref 0.7–1.3)
Glucose: 98 mg/dl (ref 70–140)
POTASSIUM: 4 meq/L (ref 3.5–5.1)
Sodium: 141 mEq/L (ref 136–145)
Total Bilirubin: 0.89 mg/dL (ref 0.20–1.20)
Total Protein: 7.1 g/dL (ref 6.4–8.3)

## 2013-12-22 NOTE — Progress Notes (Signed)
Hematology and Oncology Follow Up Visit  [name] [id number] [birth year] 68 y.o. 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 The diagnosis dates back to at least 2011.   Prior Therapy: Status post therapeutic phlebotomy in the past most recently done in April 2015.  Current therapy: Observation and surveillance.  Interim History:  Mr. Goodie presents today for a followup visit with his wife. Since the last visit, he has been doing relatively well without any complaints. Her last phlebotomy was on April of 2015 He did not really notice any improvement in his symptoms afterwards. He was also noted some hypotension have also resolved. He reports no headaches or blurry vision or double vision. Did not report any neurological symptoms or syncope. Is not reporting any chest pain shortness of breath cough or hemoptysis. He does not report any dyspnea on exertion or angina. Is not report any nausea or vomiting or abdominal pain. He does not report any genitourinary complaints. He does not report any bleeding complications such as epistaxis hematochezia or melena. His performance status continued to be excellent. Rest of his review of systems unremarkable.  Medications: I have reviewed the patient's current medications.  Current Outpatient Prescriptions  Medication Sig Dispense Refill  . aspirin 81 MG tablet Take 81 mg by mouth daily.      . famotidine (PEPCID) 20 MG tablet Take 20 mg by mouth daily.      Marland Kitchen losartan-hydrochlorothiazide (HYZAAR) 100-25 MG per tablet Take 1 tablet by mouth daily.      . meloxicam (MOBIC) 15 MG tablet Take 15 mg by mouth as needed.       Marland Kitchen oxyCODONE-acetaminophen (PERCOCET/ROXICET) 5-325 MG per tablet Take one or two tablets every 4 to 6 hours a needed for pain  30 tablet  0  . pravastatin (PRAVACHOL) 40 MG tablet Take 40 mg by  mouth daily.       No current facility-administered medications for this visit.     Allergies: No Known Allergies  Past Medical History, Surgical history, Social history, and Family History were reviewed and updated.   Blood pressure 121/79, pulse 70, temperature 98.4 F (36.9 C), temperature source Oral, resp. rate 17, height 5\' 9"  (1.753 m), weight 185 lb 9.6 oz (84.188 kg), SpO2 97.00%. ECOG: 0 General appearance: alert and cooperative. Red-complected.  Head: Normocephalic, without obvious abnormality Neck: no adenopathy Lymph nodes: Cervical, supraclavicular, and axillary nodes normal. Heart:regular rate and rhythm, S1, S2 normal, no murmur, click, rub or gallop Lung:chest clear, no wheezing, rales, normal symmetric air entry Abdomin: soft, non-tender, without masses or organomegaly EXT:no erythema, induration, or nodules Skin: No rashes or lesions.   Lab Results: Lab Results  Component Value Date   WBC 6.6 12/22/2013   HGB 19.0* 12/22/2013   HCT 58.1* 12/22/2013   MCV 84.0 12/22/2013   PLT 217 12/22/2013     Chemistry      Component Value Date/Time   NA 140 09/01/2013 1137   NA 138 05/20/2010 1035   K 3.9 09/01/2013 1137   K 4.0 05/20/2010 1035   CL 103 05/20/2010 1035   CO2 23 09/01/2013 1137   CO2 21 05/20/2010 1035   BUN 17.8 09/01/2013 1137   BUN 16 05/20/2010 1035   CREATININE 1.1 09/01/2013 1137   CREATININE 1.10 05/20/2010 1035      Component Value Date/Time   CALCIUM 9.4 09/01/2013 1137  CALCIUM 9.2 05/20/2010 1035   ALKPHOS 59 09/01/2013 1137   ALKPHOS 53 05/20/2010 1035   AST 16 09/01/2013 1137   AST 22 05/20/2010 1035   ALT 13 09/01/2013 1137   ALT 23 05/20/2010 1035   BILITOT 0.81 09/01/2013 1137   BILITOT 0.7 05/20/2010 1035         Impression and Plan:  68 year old gentleman with the following issues:  1. 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 Polycythemia vera is less likely but is also a consideration. He has a negative JAK 2 mutation and a normal white  cell count and platelets. His hemoglobin was reviewed today and he is asymptomatic. The risks and benefits of repeating phlebotomy at this point were discussed and we have elected to proceed with observation for the time being. If he develops any symptoms related to his polycythemia than phlebotomy will be used. He was educated about the symptoms today including chest pain, shortness of breath, headaches or bleeding complications.  2. Thrombosis prophylaxis: Continue be a low-dose aspirin without any bleeding issues.  3. Followup: Will be in 6 months.   NTIRWE,RXVQM, MD 10/9/20153:06 PM

## 2013-12-22 NOTE — Telephone Encounter (Signed)
gv adn printed appt sched and avs for pt for April 2016 °

## 2014-03-26 DIAGNOSIS — D225 Melanocytic nevi of trunk: Secondary | ICD-10-CM | POA: Diagnosis not present

## 2014-03-26 DIAGNOSIS — D1801 Hemangioma of skin and subcutaneous tissue: Secondary | ICD-10-CM | POA: Diagnosis not present

## 2014-03-26 DIAGNOSIS — L570 Actinic keratosis: Secondary | ICD-10-CM | POA: Diagnosis not present

## 2014-03-26 DIAGNOSIS — L821 Other seborrheic keratosis: Secondary | ICD-10-CM | POA: Diagnosis not present

## 2014-04-16 DIAGNOSIS — H52223 Regular astigmatism, bilateral: Secondary | ICD-10-CM | POA: Diagnosis not present

## 2014-04-16 DIAGNOSIS — H2513 Age-related nuclear cataract, bilateral: Secondary | ICD-10-CM | POA: Diagnosis not present

## 2014-04-16 DIAGNOSIS — H524 Presbyopia: Secondary | ICD-10-CM | POA: Diagnosis not present

## 2014-04-16 DIAGNOSIS — H3531 Nonexudative age-related macular degeneration: Secondary | ICD-10-CM | POA: Diagnosis not present

## 2014-04-16 DIAGNOSIS — H5203 Hypermetropia, bilateral: Secondary | ICD-10-CM | POA: Diagnosis not present

## 2014-04-16 DIAGNOSIS — H11153 Pinguecula, bilateral: Secondary | ICD-10-CM | POA: Diagnosis not present

## 2014-04-16 DIAGNOSIS — H18413 Arcus senilis, bilateral: Secondary | ICD-10-CM | POA: Diagnosis not present

## 2014-04-16 DIAGNOSIS — H04123 Dry eye syndrome of bilateral lacrimal glands: Secondary | ICD-10-CM | POA: Diagnosis not present

## 2014-04-16 DIAGNOSIS — H5053 Vertical heterophoria: Secondary | ICD-10-CM | POA: Diagnosis not present

## 2014-05-28 DIAGNOSIS — C61 Malignant neoplasm of prostate: Secondary | ICD-10-CM | POA: Diagnosis not present

## 2014-05-28 DIAGNOSIS — D751 Secondary polycythemia: Secondary | ICD-10-CM | POA: Diagnosis not present

## 2014-05-28 DIAGNOSIS — I10 Essential (primary) hypertension: Secondary | ICD-10-CM | POA: Diagnosis not present

## 2014-05-28 DIAGNOSIS — M7711 Lateral epicondylitis, right elbow: Secondary | ICD-10-CM | POA: Diagnosis not present

## 2014-05-28 DIAGNOSIS — E782 Mixed hyperlipidemia: Secondary | ICD-10-CM | POA: Diagnosis not present

## 2014-05-28 DIAGNOSIS — M545 Low back pain: Secondary | ICD-10-CM | POA: Diagnosis not present

## 2014-06-15 ENCOUNTER — Telehealth: Payer: Self-pay | Admitting: Oncology

## 2014-06-15 ENCOUNTER — Telehealth: Payer: Self-pay | Admitting: *Deleted

## 2014-06-15 ENCOUNTER — Ambulatory Visit (HOSPITAL_BASED_OUTPATIENT_CLINIC_OR_DEPARTMENT_OTHER): Payer: Medicare Other | Admitting: Oncology

## 2014-06-15 ENCOUNTER — Other Ambulatory Visit (HOSPITAL_BASED_OUTPATIENT_CLINIC_OR_DEPARTMENT_OTHER): Payer: Medicare Other

## 2014-06-15 ENCOUNTER — Ambulatory Visit (HOSPITAL_BASED_OUTPATIENT_CLINIC_OR_DEPARTMENT_OTHER): Payer: Medicare Other

## 2014-06-15 VITALS — BP 131/86 | HR 69 | Temp 98.0°F | Resp 18 | Ht 69.0 in | Wt 182.7 lb

## 2014-06-15 DIAGNOSIS — D751 Secondary polycythemia: Secondary | ICD-10-CM

## 2014-06-15 LAB — CBC WITH DIFFERENTIAL/PLATELET
BASO%: 0.6 % (ref 0.0–2.0)
Basophils Absolute: 0 10*3/uL (ref 0.0–0.1)
EOS%: 3.8 % (ref 0.0–7.0)
Eosinophils Absolute: 0.2 10*3/uL (ref 0.0–0.5)
HEMATOCRIT: 60.3 % — AB (ref 38.4–49.9)
HEMOGLOBIN: 20 g/dL — AB (ref 13.0–17.1)
LYMPH%: 24.1 % (ref 14.0–49.0)
MCH: 27.6 pg (ref 27.2–33.4)
MCHC: 33.1 g/dL (ref 32.0–36.0)
MCV: 83.2 fL (ref 79.3–98.0)
MONO#: 0.6 10*3/uL (ref 0.1–0.9)
MONO%: 13.5 % (ref 0.0–14.0)
NEUT%: 58 % (ref 39.0–75.0)
NEUTROS ABS: 2.7 10*3/uL (ref 1.5–6.5)
Platelets: 202 10*3/uL (ref 140–400)
RBC: 7.25 10*6/uL — AB (ref 4.20–5.82)
RDW: 14.4 % (ref 11.0–14.6)
WBC: 4.7 10*3/uL (ref 4.0–10.3)
lymph#: 1.1 10*3/uL (ref 0.9–3.3)

## 2014-06-15 LAB — COMPREHENSIVE METABOLIC PANEL (CC13)
ALT: 19 U/L (ref 0–55)
AST: 19 U/L (ref 5–34)
Albumin: 3.9 g/dL (ref 3.5–5.0)
Alkaline Phosphatase: 62 U/L (ref 40–150)
Anion Gap: 11 mEq/L (ref 3–11)
BILIRUBIN TOTAL: 0.82 mg/dL (ref 0.20–1.20)
BUN: 15.7 mg/dL (ref 7.0–26.0)
CO2: 24 mEq/L (ref 22–29)
Calcium: 9.3 mg/dL (ref 8.4–10.4)
Chloride: 105 mEq/L (ref 98–109)
Creatinine: 1 mg/dL (ref 0.7–1.3)
EGFR: 74 mL/min/{1.73_m2} — ABNORMAL LOW (ref >90)
Glucose: 110 mg/dl (ref 70–140)
Potassium: 4 mEq/L (ref 3.5–5.1)
SODIUM: 140 meq/L (ref 136–145)
TOTAL PROTEIN: 7.2 g/dL (ref 6.4–8.3)

## 2014-06-15 NOTE — Progress Notes (Signed)
Therapeutic phlebotomy performed from 1040-1050 with 518 mls obtained. Pt tolerated well. Ate cheese and crackers, coke. D/Cd after 30 minutes.

## 2014-06-15 NOTE — Telephone Encounter (Signed)
Pt confirmed labs/ov per 04/01 POF, gave pt AVS and Calendar...Marland KitchenMarland KitchenCherylann Banas, scheduled Phlebotomy today and sent msg to add at next visit.Marland KitchenMarland KitchenMarland Kitchen

## 2014-06-15 NOTE — Patient Instructions (Signed)

## 2014-06-15 NOTE — Progress Notes (Signed)
Hematology and Oncology Follow Up Visit  [name] [id number] [birth year] 69 y.o. 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 The diagnosis dates back to at least 2011.   Prior Therapy: Status post therapeutic phlebotomy in the past most recently done in April 2015.  Current therapy: Observation and surveillance.  Interim History:  [name] presents today for a followup visit with his wife. Since the last visit, he reports no new complaints. His last phlebotomy was on April of 2015 He did not really notice any improvement in his symptoms afterwards. He reports some occasional headaches but no blurry vision or double vision. Did not report any neurological symptoms or syncope. He continues to be active and perform a lot of work outside. This includes fishing and other yard work.  He is not reporting any chest pain shortness of breath cough or hemoptysis. He does not report any dyspnea on exertion or angina. Is not report any nausea or vomiting or abdominal pain. He does not report any genitourinary complaints. He does not report any bleeding complications such as epistaxis hematochezia or melena. His performance status continued to be excellent. Rest of his review of systems unremarkable.  Medications: I have reviewed the patient's current medications.  Current Outpatient Prescriptions  Medication Sig Dispense Refill  . aspirin 81 MG tablet Take 81 mg by mouth daily.    . famotidine (PEPCID) 20 MG tablet Take 20 mg by mouth daily.    Marland Kitchen losartan-hydrochlorothiazide (HYZAAR) 100-25 MG per tablet Take 1 tablet by mouth daily.    . meloxicam (MOBIC) 15 MG tablet Take 15 mg by mouth as needed.     Marland Kitchen oxyCODONE-acetaminophen (PERCOCET/ROXICET) 5-325 MG per tablet Take one or two tablets every 4 to 6 hours a needed for pain 30 tablet 0  . pravastatin  (PRAVACHOL) 40 MG tablet Take 40 mg by mouth daily.     No current facility-administered medications for this visit.     Allergies: No Known Allergies  Past Medical History, Surgical history, Social history, and Family History were reviewed and updated.   Blood pressure 131/86, pulse 69, temperature 98 F (36.7 C), temperature source Oral, resp. rate 18, height 5\' 9"  (1.753 m), weight 182 lb 11.2 oz (82.872 kg), SpO2 98 %. ECOG: 0 General appearance: alert and cooperative. Red-complected.  Head: Normocephalic, without obvious abnormality Neck: no adenopathy Lymph nodes: Cervical, supraclavicular, and axillary nodes normal. Heart:regular rate and rhythm, S1, S2 normal, no murmur, click, rub or gallop Lung:chest clear, no wheezing, rales, normal symmetric air entry Abdomin: soft, non-tender, without masses or organomegaly EXT:no erythema, induration, or nodules Skin: No rashes or lesions.   neurological examination: He had no deficits.  Lab Results: Lab Results  Component Value Date   WBC 4.7 06/15/2014   HGB 20.0* 06/15/2014   HCT 60.3* 06/15/2014   MCV 83.2 06/15/2014   PLT 202 06/15/2014     Chemistry      Component Value Date/Time   NA 141 12/22/2013 1438   NA 138 05/20/2010 1035   K 4.0 12/22/2013 1438   K 4.0 05/20/2010 1035   CL 103 05/20/2010 1035   CO2 28 12/22/2013 1438   CO2 21 05/20/2010 1035   BUN 18.2 12/22/2013 1438   BUN 16 05/20/2010 1035   CREATININE 1.1 12/22/2013 1438   CREATININE 1.10 05/20/2010 1035      Component Value Date/Time  CALCIUM 9.5 12/22/2013 1438   CALCIUM 9.2 05/20/2010 1035   ALKPHOS 66 12/22/2013 1438   ALKPHOS 53 05/20/2010 1035   AST 19 12/22/2013 1438   AST 22 05/20/2010 1035   ALT 15 12/22/2013 1438   ALT 23 05/20/2010 1035   BILITOT 0.89 12/22/2013 1438   BILITOT 0.7 05/20/2010 1035         Impression and Plan:  69 year old gentleman with the following issues:  1. Polycythemia likely due to secondary  causes. Polycythemia vera is less likely but is also a consideration. He has a negative JAK 2 mutation and a normal white cell count and platelets.  His hemoglobin is more elevated today and he is mildly symptomatic with mild headaches. The risks and benefits of repeating phlebotomy at this point were discussed and we have elected to proceed with  phlebotomy today to get his hemoglobin below 20.   2. Thrombosis prophylaxis: Continue be a low-dose aspirin without any bleeding issues.  3. Followup: Will be in 4 months.   [facility], MD 4/1/201610:08 AM

## 2014-06-15 NOTE — Telephone Encounter (Signed)
Per staff message and POF I have scheduled appts. Advised scheduler of appts. JMW  

## 2014-07-06 DIAGNOSIS — M545 Low back pain: Secondary | ICD-10-CM | POA: Diagnosis not present

## 2014-07-09 DIAGNOSIS — M545 Low back pain: Secondary | ICD-10-CM | POA: Diagnosis not present

## 2014-08-02 DIAGNOSIS — M47816 Spondylosis without myelopathy or radiculopathy, lumbar region: Secondary | ICD-10-CM | POA: Diagnosis not present

## 2014-09-03 DIAGNOSIS — M545 Low back pain: Secondary | ICD-10-CM | POA: Diagnosis not present

## 2014-09-06 DIAGNOSIS — M545 Low back pain: Secondary | ICD-10-CM | POA: Diagnosis not present

## 2014-09-28 DIAGNOSIS — M545 Low back pain: Secondary | ICD-10-CM | POA: Diagnosis not present

## 2014-09-28 DIAGNOSIS — M47816 Spondylosis without myelopathy or radiculopathy, lumbar region: Secondary | ICD-10-CM | POA: Diagnosis not present

## 2014-10-19 ENCOUNTER — Ambulatory Visit: Payer: Federal, State, Local not specified - PPO | Admitting: Oncology

## 2014-10-19 ENCOUNTER — Other Ambulatory Visit: Payer: Federal, State, Local not specified - PPO

## 2014-12-24 DIAGNOSIS — I10 Essential (primary) hypertension: Secondary | ICD-10-CM | POA: Diagnosis not present

## 2014-12-24 DIAGNOSIS — M545 Low back pain: Secondary | ICD-10-CM | POA: Diagnosis not present

## 2014-12-24 DIAGNOSIS — Z0000 Encounter for general adult medical examination without abnormal findings: Secondary | ICD-10-CM | POA: Diagnosis not present

## 2014-12-24 DIAGNOSIS — Z1389 Encounter for screening for other disorder: Secondary | ICD-10-CM | POA: Diagnosis not present

## 2014-12-24 DIAGNOSIS — C61 Malignant neoplasm of prostate: Secondary | ICD-10-CM | POA: Diagnosis not present

## 2014-12-24 DIAGNOSIS — Z23 Encounter for immunization: Secondary | ICD-10-CM | POA: Diagnosis not present

## 2014-12-24 DIAGNOSIS — E782 Mixed hyperlipidemia: Secondary | ICD-10-CM | POA: Diagnosis not present

## 2014-12-24 DIAGNOSIS — Z1211 Encounter for screening for malignant neoplasm of colon: Secondary | ICD-10-CM | POA: Diagnosis not present

## 2014-12-24 DIAGNOSIS — D751 Secondary polycythemia: Secondary | ICD-10-CM | POA: Diagnosis not present

## 2014-12-25 ENCOUNTER — Telehealth: Payer: Self-pay | Admitting: Oncology

## 2014-12-25 NOTE — Telephone Encounter (Signed)
Wife called to reschedule missed appointments from August 2016. Gave wife new appointment for lab/fu 11/4 @ 8:30 am.

## 2014-12-31 DIAGNOSIS — Z09 Encounter for follow-up examination after completed treatment for conditions other than malignant neoplasm: Secondary | ICD-10-CM | POA: Diagnosis not present

## 2014-12-31 DIAGNOSIS — K5730 Diverticulosis of large intestine without perforation or abscess without bleeding: Secondary | ICD-10-CM | POA: Diagnosis not present

## 2014-12-31 DIAGNOSIS — Z86010 Personal history of colonic polyps: Secondary | ICD-10-CM | POA: Diagnosis not present

## 2015-01-17 ENCOUNTER — Other Ambulatory Visit: Payer: Self-pay | Admitting: *Deleted

## 2015-01-17 DIAGNOSIS — D751 Secondary polycythemia: Secondary | ICD-10-CM

## 2015-01-18 ENCOUNTER — Ambulatory Visit (HOSPITAL_BASED_OUTPATIENT_CLINIC_OR_DEPARTMENT_OTHER): Payer: Medicare Other

## 2015-01-18 ENCOUNTER — Ambulatory Visit (HOSPITAL_BASED_OUTPATIENT_CLINIC_OR_DEPARTMENT_OTHER): Payer: Medicare Other | Admitting: Oncology

## 2015-01-18 ENCOUNTER — Telehealth: Payer: Self-pay | Admitting: *Deleted

## 2015-01-18 ENCOUNTER — Other Ambulatory Visit (HOSPITAL_BASED_OUTPATIENT_CLINIC_OR_DEPARTMENT_OTHER): Payer: Medicare Other

## 2015-01-18 ENCOUNTER — Telehealth: Payer: Self-pay | Admitting: Oncology

## 2015-01-18 VITALS — BP 141/85 | HR 66 | Temp 98.4°F | Resp 18 | Ht 69.0 in | Wt 187.0 lb

## 2015-01-18 DIAGNOSIS — D751 Secondary polycythemia: Secondary | ICD-10-CM | POA: Diagnosis not present

## 2015-01-18 DIAGNOSIS — Z7982 Long term (current) use of aspirin: Secondary | ICD-10-CM | POA: Diagnosis not present

## 2015-01-18 LAB — CBC WITH DIFFERENTIAL/PLATELET
BASO%: 0.9 % (ref 0.0–2.0)
BASOS ABS: 0 10*3/uL (ref 0.0–0.1)
EOS%: 4.9 % (ref 0.0–7.0)
Eosinophils Absolute: 0.2 10*3/uL (ref 0.0–0.5)
HCT: 61.1 % — ABNORMAL HIGH (ref 38.4–49.9)
HGB: 20.1 g/dL — ABNORMAL HIGH (ref 13.0–17.1)
LYMPH#: 1.2 10*3/uL (ref 0.9–3.3)
LYMPH%: 23.9 % (ref 14.0–49.0)
MCH: 27.6 pg (ref 27.2–33.4)
MCHC: 32.9 g/dL (ref 32.0–36.0)
MCV: 83.8 fL (ref 79.3–98.0)
MONO#: 0.6 10*3/uL (ref 0.1–0.9)
MONO%: 11.4 % (ref 0.0–14.0)
NEUT#: 2.9 10*3/uL (ref 1.5–6.5)
NEUT%: 58.9 % (ref 39.0–75.0)
PLATELETS: 200 10*3/uL (ref 140–400)
RBC: 7.29 10*6/uL — ABNORMAL HIGH (ref 4.20–5.82)
RDW: 14.4 % (ref 11.0–14.6)
WBC: 4.9 10*3/uL (ref 4.0–10.3)

## 2015-01-18 LAB — COMPREHENSIVE METABOLIC PANEL (CC13)
ALT: 16 U/L (ref 0–55)
ANION GAP: 9 meq/L (ref 3–11)
AST: 15 U/L (ref 5–34)
Albumin: 3.8 g/dL (ref 3.5–5.0)
Alkaline Phosphatase: 66 U/L (ref 40–150)
BUN: 21.5 mg/dL (ref 7.0–26.0)
CALCIUM: 9.8 mg/dL (ref 8.4–10.4)
CHLORIDE: 106 meq/L (ref 98–109)
CO2: 25 mEq/L (ref 22–29)
Creatinine: 1.1 mg/dL (ref 0.7–1.3)
EGFR: 68 mL/min/{1.73_m2} — ABNORMAL LOW (ref >90)
Glucose: 111 mg/dl (ref 70–140)
Potassium: 4.1 mEq/L (ref 3.5–5.1)
Sodium: 141 mEq/L (ref 136–145)
Total Bilirubin: 0.91 mg/dL (ref 0.20–1.20)
Total Protein: 7.1 g/dL (ref 6.4–8.3)

## 2015-01-18 NOTE — Telephone Encounter (Signed)
per pof to sch pt appt-sent MW email; to sch phlebotomy-gave pt copy of avs

## 2015-01-18 NOTE — Progress Notes (Signed)
Hematology and Oncology Follow Up Visit  [name] [id number] [birth year] 69 y.o. 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 The diagnosis dates back to at least 2011.   Prior Therapy: Status post therapeutic phlebotomy in the past on an intermittent basis.  Current therapy: Observation and surveillance.  Interim History:  [name] presents today for a followup visit with his wife. Since the last visit, he continues to do well.  His last phlebotomy was on April of 2016 He did not really notice any changes on where another. He reports some occasional headaches but no blurry vision or double vision. Did not report any neurological symptoms or syncope. He continues to be active and perform a lot of work outside. He continues to be on low-dose aspirin without any bleeding or thrombosis.   He is not reporting any chest pain shortness of breath cough or hemoptysis. He does not report any dyspnea on exertion or angina. Is not report any nausea or vomiting or abdominal pain. He does not report any genitourinary complaints. He does not report any bleeding complications such as epistaxis hematochezia or melena. His performance status continued to be excellent. Rest of his review of systems unremarkable.  Medications: I have reviewed the patient's current medications.  Current Outpatient Prescriptions  Medication Sig Dispense Refill  . aspirin 81 MG tablet Take 81 mg by mouth daily.    Marland Kitchen losartan-hydrochlorothiazide (HYZAAR) 100-25 MG per tablet Take 1 tablet by mouth daily.    . meloxicam (MOBIC) 15 MG tablet Take 15 mg by mouth as needed.     . pravastatin (PRAVACHOL) 40 MG tablet Take 40 mg by mouth daily.    . famotidine (PEPCID) 20 MG tablet Take 20 mg by mouth daily.    Marland Kitchen oxyCODONE-acetaminophen (PERCOCET/ROXICET) 5-325 MG per tablet Take one or two  tablets every 4 to 6 hours a needed for pain (Patient not taking: Reported on 01/18/2015) 30 tablet 0   No current facility-administered medications for this visit.     Allergies: No Known Allergies  Past Medical History, Surgical history, Social history, and Family History were reviewed and updated.   Blood pressure 141/85, pulse 66, temperature 98.4 F (36.9 C), temperature source Oral, resp. rate 18, height 5\' 9"  (1.753 m), weight 187 lb (84.823 kg), SpO2 96 %. ECOG: 0 General appearance: alert and cooperative. Did not appear any distress. Head: Normocephalic, without obvious abnormality no oral ulcers or lesions. Neck: no adenopathy Lymph nodes: Cervical, supraclavicular, and axillary nodes normal. Heart:regular rate and rhythm, S1, S2 normal, no murmur, click, rub or gallop Lung:chest clear, no wheezing, rales, normal symmetric air entry Abdomin: soft, non-tender, without masses or organomegaly shifting dullness or ascites. EXT:no erythema, induration, or nodules Skin: No rashes or lesions.  Neurological examination: He had no deficits.  Lab Results: Lab Results  Component Value Date   WBC 4.9 01/18/2015   HGB 20.1* 01/18/2015   HCT 61.1* 01/18/2015   MCV 83.8 01/18/2015   PLT 200 01/18/2015     Chemistry      Component Value Date/Time   NA 141 01/18/2015 0835   NA 138 05/20/2010 1035   K 4.1 01/18/2015 0835   K 4.0 05/20/2010 1035   CL 103 05/20/2010 1035   CO2 25 01/18/2015 0835   CO2 21 05/20/2010 1035   BUN 21.5 01/18/2015 0835   BUN 16 05/20/2010 1035   CREATININE  1.1 01/18/2015 0835   CREATININE 1.10 05/20/2010 1035      Component Value Date/Time   CALCIUM 9.8 01/18/2015 0835   CALCIUM 9.2 05/20/2010 1035   ALKPHOS 66 01/18/2015 0835   ALKPHOS 53 05/20/2010 1035   AST 15 01/18/2015 0835   AST 22 05/20/2010 1035   ALT 16 01/18/2015 0835   ALT 23 05/20/2010 1035   BILITOT 0.91 01/18/2015 0835   BILITOT 0.7 05/20/2010 1035         Impression  and Plan:  69 year old gentleman with the following issues:  1. Polycythemia likely due to secondary causes. Polycythemia vera is less likely but is also a consideration. He has a negative JAK 2 mutation and a normal white cell count and platelets.  His hemoglobin continues to be elevated and likely suggestive of a myeloproliferative disorder. Although he is asymptomatic, he is a risk of thrombosis. For that reason we'll proceed with phlebotomy today and I will be repeated every 3 months to continue his hemoglobin close to 17.  2. Thrombosis prophylaxis: Continue be a low-dose aspirin without any bleeding issues.  3. Followup: Will be in 3 months for phlebotomy and in 6 months for clinical visit.   [facility], MD 11/4/20169:15 AM

## 2015-01-18 NOTE — Patient Instructions (Signed)

## 2015-01-18 NOTE — Telephone Encounter (Signed)
Per staff message and POF I have scheduled appts. Advised scheduler of appts. JMW  

## 2015-04-19 ENCOUNTER — Other Ambulatory Visit (HOSPITAL_BASED_OUTPATIENT_CLINIC_OR_DEPARTMENT_OTHER): Payer: Medicare Other

## 2015-04-19 ENCOUNTER — Ambulatory Visit (HOSPITAL_BASED_OUTPATIENT_CLINIC_OR_DEPARTMENT_OTHER): Payer: Medicare Other

## 2015-04-19 VITALS — BP 126/78 | HR 68 | Temp 98.1°F | Resp 18

## 2015-04-19 DIAGNOSIS — D751 Secondary polycythemia: Secondary | ICD-10-CM

## 2015-04-19 LAB — CBC WITH DIFFERENTIAL/PLATELET
BASO%: 0.8 % (ref 0.0–2.0)
Basophils Absolute: 0 10*3/uL (ref 0.0–0.1)
EOS%: 3.9 % (ref 0.0–7.0)
Eosinophils Absolute: 0.2 10*3/uL (ref 0.0–0.5)
HEMATOCRIT: 58.4 % — AB (ref 38.4–49.9)
HGB: 19.9 g/dL — ABNORMAL HIGH (ref 13.0–17.1)
LYMPH#: 1.2 10*3/uL (ref 0.9–3.3)
LYMPH%: 24.2 % (ref 14.0–49.0)
MCH: 28.8 pg (ref 27.2–33.4)
MCHC: 34.1 g/dL (ref 32.0–36.0)
MCV: 84.5 fL (ref 79.3–98.0)
MONO#: 0.5 10*3/uL (ref 0.1–0.9)
MONO%: 9.4 % (ref 0.0–14.0)
NEUT%: 61.7 % (ref 39.0–75.0)
NEUTROS ABS: 3.1 10*3/uL (ref 1.5–6.5)
PLATELETS: 184 10*3/uL (ref 140–400)
RBC: 6.91 10*6/uL — AB (ref 4.20–5.82)
RDW: 14.1 % (ref 11.0–14.6)
WBC: 5.1 10*3/uL (ref 4.0–10.3)

## 2015-04-19 NOTE — Patient Instructions (Signed)

## 2015-07-19 ENCOUNTER — Telehealth: Payer: Self-pay | Admitting: Oncology

## 2015-07-19 ENCOUNTER — Other Ambulatory Visit (HOSPITAL_BASED_OUTPATIENT_CLINIC_OR_DEPARTMENT_OTHER): Payer: Medicare Other

## 2015-07-19 ENCOUNTER — Ambulatory Visit (HOSPITAL_BASED_OUTPATIENT_CLINIC_OR_DEPARTMENT_OTHER): Payer: Medicare Other | Admitting: Oncology

## 2015-07-19 ENCOUNTER — Ambulatory Visit (HOSPITAL_BASED_OUTPATIENT_CLINIC_OR_DEPARTMENT_OTHER): Payer: Medicare Other

## 2015-07-19 VITALS — BP 137/92 | HR 72 | Temp 98.6°F | Resp 18 | Ht 69.0 in | Wt 188.4 lb

## 2015-07-19 VITALS — BP 133/81 | HR 68 | Temp 98.4°F | Resp 18

## 2015-07-19 DIAGNOSIS — D751 Secondary polycythemia: Secondary | ICD-10-CM

## 2015-07-19 LAB — CBC WITH DIFFERENTIAL/PLATELET
BASO%: 0.6 % (ref 0.0–2.0)
Basophils Absolute: 0 10*3/uL (ref 0.0–0.1)
EOS%: 3.9 % (ref 0.0–7.0)
Eosinophils Absolute: 0.2 10*3/uL (ref 0.0–0.5)
HCT: 59.1 % — ABNORMAL HIGH (ref 38.4–49.9)
HGB: 20.5 g/dL — ABNORMAL HIGH (ref 13.0–17.1)
LYMPH%: 28.2 % (ref 14.0–49.0)
MCH: 28.8 pg (ref 27.2–33.4)
MCHC: 34.7 g/dL (ref 32.0–36.0)
MCV: 82.9 fL (ref 79.3–98.0)
MONO#: 0.5 10*3/uL (ref 0.1–0.9)
MONO%: 10.4 % (ref 0.0–14.0)
NEUT#: 2.9 10*3/uL (ref 1.5–6.5)
NEUT%: 56.9 % (ref 39.0–75.0)
Platelets: 175 10*3/uL (ref 140–400)
RBC: 7.13 10*6/uL — AB (ref 4.20–5.82)
RDW: 14.5 % (ref 11.0–14.6)
WBC: 5.1 10*3/uL (ref 4.0–10.3)
lymph#: 1.4 10*3/uL (ref 0.9–3.3)

## 2015-07-19 NOTE — Progress Notes (Signed)
Hematology and Oncology Follow Up Visit  [name] [id number] June 24, 1945 70 y.o. 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 The diagnosis dates back to at least 2011.   Prior Therapy: Status post therapeutic phlebotomy in the past on an intermittent basis.  Current therapy: Observation and surveillance.  Interim History:  Mr. [name] presents today for a followup visit with his wife. Since the last visit, he reports no major changes in his health. He did receive a phlebotomy 3 months ago which did not make any difference in his symptoms. He still report some fatigue and tiredness but really no vascular events. He does report sinus headaches but no visual changes. He denied any chest pain or difficulty breathing. He denied any abdominal pain or discomfort.  He continues to be active and perform a lot of work outside. He continues to be on low-dose aspirin without any bleeding or thrombosis.   He is not reporting any chest pain shortness of breath cough or hemoptysis. He does not report any dyspnea on exertion or angina. Is not report any nausea or vomiting or abdominal pain. He does not report any genitourinary complaints. He does not report any bleeding complications such as epistaxis hematochezia or melena. His performance status continued to be excellent. Rest of his review of systems unremarkable.  Medications: I have reviewed the patient's current medications.  Current Outpatient Prescriptions  Medication Sig Dispense Refill  . aspirin 81 MG tablet Take 81 mg by mouth daily.    . famotidine (PEPCID) 20 MG tablet Take 20 mg by mouth daily.    Marland Kitchen losartan-hydrochlorothiazide (HYZAAR) 100-25 MG per tablet Take 1 tablet by mouth daily.    . meloxicam (MOBIC) 15 MG tablet Take 15 mg by mouth as needed.     Marland Kitchen oxyCODONE-acetaminophen (PERCOCET/ROXICET)  5-325 MG per tablet Take one or two tablets every 4 to 6 hours a needed for pain 30 tablet 0  . pravastatin (PRAVACHOL) 40 MG tablet Take 40 mg by mouth daily.     No current facility-administered medications for this visit.     Allergies: No Known Allergies  Past Medical History, Surgical history, Social history, and Family History were reviewed and updated.   Blood pressure 137/92, pulse 72, temperature 98.6 F (37 C), temperature source Oral, resp. rate 18, height 5\' 9"  (1.753 m), weight 188 lb 6.4 oz (85.458 kg), SpO2 99 %. ECOG: 0 General appearance: Healthy appearing gentleman without distress. Head: Normocephalic, without obvious abnormality no oral ulcers or lesions. Neck: no adenopathy Lymph nodes: Cervical, supraclavicular, and axillary nodes normal. Heart:regular rate and rhythm, S1, S2 normal, no murmur, click, rub or gallop Lung:chest clear, no wheezing, rales, normal symmetric air entry Abdomin: soft, non-tender, without masses or organomegaly or splenomegaly. EXT:no erythema, induration, or nodules Skin: No rashes or lesions.  Neurological examination: no deficits noted.  Lab Results: Lab Results  Component Value Date   WBC 5.1 07/19/2015   HGB 20.5* 07/19/2015   HCT 59.1* 07/19/2015   MCV 82.9 07/19/2015   PLT 175 07/19/2015     Chemistry      Component Value Date/Time   NA 141 01/18/2015 0835   NA 138 05/20/2010 1035   K 4.1 01/18/2015 0835   K 4.0 05/20/2010 1035   CL 103 05/20/2010 1035   CO2 25 01/18/2015 0835   CO2 21 05/20/2010 1035   BUN 21.5 01/18/2015 0835  BUN 16 05/20/2010 1035   CREATININE 1.1 01/18/2015 0835   CREATININE 1.10 05/20/2010 1035      Component Value Date/Time   CALCIUM 9.8 01/18/2015 0835   CALCIUM 9.2 05/20/2010 1035   ALKPHOS 66 01/18/2015 0835   ALKPHOS 53 05/20/2010 1035   AST 15 01/18/2015 0835   AST 22 05/20/2010 1035   ALT 16 01/18/2015 0835   ALT 23 05/20/2010 1035   BILITOT 0.91 01/18/2015 0835   BILITOT  0.7 05/20/2010 1035         Impression and Plan:  70 year old gentleman with the following issues:  1. Polycythemia: Differential diagnosis include polycythemia vera versus secondary causes. He has a negative JAK 2 mutation and a normal white cell count and platelets.  His hemoglobin continues to be elevated and likely suggestive of a myeloproliferative disorder. Given the persistence elevation in his hemoglobin and no clear-cut secondary causes for his erythrocytosis, I still suspect that he might have polycythemia vera and possibly have exon 12 mutation which is a small set of polycythemia vera that have JAK2 negative mutation.  In the meantime, I feel that aggressive hemoglobin control it is necessary in him to prevent further symptoms and thrombotic events. I plan on proceeding with therapeutic phlebotomies every 2 weeks to get his hematocrit below 45 and we will evaluate that in 3 months.  They use of cytoreductive agent such as hydroxyurea will be contemplated. His risk of thrombosis is still low despite his age because of his normal white cell count as well as local any thrombotic events.  2. Thrombosis prophylaxis: Continue be a low-dose aspirin without any bleeding issues.  3. Followup: Will be in 3 months for reevaluation.   [facility], MD 5/5/20179:13 AM

## 2015-07-19 NOTE — Patient Instructions (Signed)

## 2015-07-19 NOTE — Telephone Encounter (Signed)
Pt wanted phlebotomy switched to Monday after 5/19 .... Pt prefers Monday over Friday and cannot do any other day of the week. Office visit sched on Friday because MD not available on Monday.[name] pt appt & avs

## 2015-07-19 NOTE — Progress Notes (Signed)
Therapeutic phlebotomy performed via left AC without complications.  500g removed from 10:02 to 10:07.  Pt given snack and drink and observed for 30 mins post procedure.  Vitals obtained and remain stable as charted.  No further questions or concerns at time of discharge.

## 2015-08-02 ENCOUNTER — Other Ambulatory Visit (HOSPITAL_BASED_OUTPATIENT_CLINIC_OR_DEPARTMENT_OTHER): Payer: Medicare Other

## 2015-08-02 ENCOUNTER — Other Ambulatory Visit: Payer: Self-pay | Admitting: Oncology

## 2015-08-02 ENCOUNTER — Ambulatory Visit (HOSPITAL_BASED_OUTPATIENT_CLINIC_OR_DEPARTMENT_OTHER): Payer: Medicare Other

## 2015-08-02 VITALS — BP 125/82 | HR 67 | Temp 98.4°F | Resp 18

## 2015-08-02 DIAGNOSIS — D751 Secondary polycythemia: Secondary | ICD-10-CM

## 2015-08-02 LAB — CBC WITH DIFFERENTIAL/PLATELET
BASO%: 1 % (ref 0.0–2.0)
BASOS ABS: 0.1 10*3/uL (ref 0.0–0.1)
EOS%: 3.2 % (ref 0.0–7.0)
Eosinophils Absolute: 0.2 10*3/uL (ref 0.0–0.5)
HEMATOCRIT: 60.7 % — AB (ref 38.4–49.9)
HEMOGLOBIN: 19.9 g/dL — AB (ref 13.0–17.1)
LYMPH#: 1.4 10*3/uL (ref 0.9–3.3)
LYMPH%: 23 % (ref 14.0–49.0)
MCH: 27.3 pg (ref 27.2–33.4)
MCHC: 32.7 g/dL (ref 32.0–36.0)
MCV: 83.5 fL (ref 79.3–98.0)
MONO#: 0.7 10*3/uL (ref 0.1–0.9)
MONO%: 11.9 % (ref 0.0–14.0)
NEUT%: 60.9 % (ref 39.0–75.0)
NEUTROS ABS: 3.7 10*3/uL (ref 1.5–6.5)
Platelets: 204 10*3/uL (ref 140–400)
RBC: 7.27 10*6/uL — ABNORMAL HIGH (ref 4.20–5.82)
RDW: 14.9 % — AB (ref 11.0–14.6)
WBC: 6.1 10*3/uL (ref 4.0–10.3)

## 2015-08-02 NOTE — Patient Instructions (Signed)
Therapeutic Phlebotomy, Care After  Refer to this sheet in the next few weeks. These instructions provide you with information about caring for yourself after your procedure. Your health care provider may also give you more specific instructions. Your treatment has been planned according to current medical practices, but problems sometimes occur. Call your health care provider if you have any problems or questions after your procedure.  WHAT TO EXPECT AFTER THE PROCEDURE  After your procedure, it is common to have:   Light-headedness or dizziness. You may feel faint.   Nausea.   Tiredness.  HOME CARE INSTRUCTIONS  Activities   Return to your normal activities as directed by your health care provider. Most people can go back to their normal activities right away.   Avoid strenuous physical activity and heavy lifting or pulling for about 5 hours after the procedure. Do not lift anything that is heavier than 10 lb (4.5 kg).   Athletes should avoid strenuous exercise for at least 12 hours.   Change positions slowly for the remainder of the day. This will help to prevent light-headedness or fainting.   If you feel light-headed, lie down until the feeling goes away.  Eating and Drinking   Be sure to eat well-balanced meals for the next 24 hours.   Drink enough fluid to keep your urine clear or pale yellow.   Avoid drinking alcohol on the day that you had the procedure.  Care of the Needle Insertion Site   Keep your bandage dry. You can remove the bandage after about 5 hours or as directed by your health care provider.   If you have bleeding from the needle insertion site, elevate your arm and press firmly on the site until the bleeding stops.   If you have bruising at the site, apply ice to the area:   Put ice in a plastic bag.   Place a towel between your skin and the bag.   Leave the ice on for 20 minutes, 2-3 times a day for the first 24 hours.   If the swelling does not go away after 24 hours, apply  a warm, moist washcloth to the area for 20 minutes, 2-3 times a day.  General Instructions   Avoid smoking for at least 30 minutes after the procedure.   Keep all follow-up visits as directed by your health care provider. It is important to continue with further therapeutic phlebotomy treatments as directed.  SEEK MEDICAL CARE IF:   You have redness, swelling, or pain at the needle insertion site.   You have fluid, blood, or pus coming from the needle insertion site.   You feel light-headed, dizzy, or nauseated, and the feeling does not go away.   You notice new bruising at the needle insertion site.   You feel weaker than normal.   You have a fever or chills.  SEEK IMMEDIATE MEDICAL CARE IF:   You have severe nausea or vomiting.   You have chest pain.   You have trouble breathing.    This information is not intended to replace advice given to you by your health care provider. Make sure you discuss any questions you have with your health care provider.    Document Released: 08/04/2010 Document Revised: 07/17/2014 Document Reviewed: 02/26/2014  Elsevier Interactive Patient Education 2016 [employer]

## 2015-08-13 ENCOUNTER — Telehealth: Payer: Self-pay | Admitting: Oncology

## 2015-08-13 NOTE — Telephone Encounter (Signed)
pt called to r/s appt...done....pt ok and aware of new d.t °

## 2015-08-19 ENCOUNTER — Other Ambulatory Visit (HOSPITAL_BASED_OUTPATIENT_CLINIC_OR_DEPARTMENT_OTHER): Payer: Medicare Other

## 2015-08-19 ENCOUNTER — Ambulatory Visit (HOSPITAL_BASED_OUTPATIENT_CLINIC_OR_DEPARTMENT_OTHER): Payer: Medicare Other

## 2015-08-19 DIAGNOSIS — D751 Secondary polycythemia: Secondary | ICD-10-CM | POA: Diagnosis present

## 2015-08-19 LAB — CBC WITH DIFFERENTIAL/PLATELET
BASO%: 0.9 % (ref 0.0–2.0)
Basophils Absolute: 0.1 10*3/uL (ref 0.0–0.1)
EOS ABS: 0.2 10*3/uL (ref 0.0–0.5)
EOS%: 3.1 % (ref 0.0–7.0)
HCT: 59.5 % — ABNORMAL HIGH (ref 38.4–49.9)
HEMOGLOBIN: 19.5 g/dL — AB (ref 13.0–17.1)
LYMPH%: 21.3 % (ref 14.0–49.0)
MCH: 27.1 pg — ABNORMAL LOW (ref 27.2–33.4)
MCHC: 32.8 g/dL (ref 32.0–36.0)
MCV: 82.8 fL (ref 79.3–98.0)
MONO#: 0.6 10*3/uL (ref 0.1–0.9)
MONO%: 10.7 % (ref 0.0–14.0)
NEUT%: 64 % (ref 39.0–75.0)
NEUTROS ABS: 3.9 10*3/uL (ref 1.5–6.5)
PLATELETS: 212 10*3/uL (ref 140–400)
RBC: 7.19 10*6/uL — AB (ref 4.20–5.82)
RDW: 15 % — AB (ref 11.0–14.6)
WBC: 6.1 10*3/uL (ref 4.0–10.3)
lymph#: 1.3 10*3/uL (ref 0.9–3.3)

## 2015-08-19 NOTE — Patient Instructions (Signed)

## 2015-08-19 NOTE — Progress Notes (Signed)
Two unsuccessful attempts by this RN.  Access obtained in left [facility] on 3rd stick.  546grams removed from [phone].  Pt tolerated well and is now eating and drinking during 30 post observation period. [id number]:  Pt has remained stable for 30 min. Observation.  Discharged to home with spouse.

## 2015-09-02 ENCOUNTER — Other Ambulatory Visit: Payer: Federal, State, Local not specified - PPO

## 2015-09-09 ENCOUNTER — Other Ambulatory Visit (HOSPITAL_BASED_OUTPATIENT_CLINIC_OR_DEPARTMENT_OTHER): Payer: Medicare Other

## 2015-09-09 ENCOUNTER — Ambulatory Visit (HOSPITAL_BASED_OUTPATIENT_CLINIC_OR_DEPARTMENT_OTHER): Payer: Medicare Other

## 2015-09-09 VITALS — BP 127/81 | HR 63 | Temp 98.9°F | Resp 18

## 2015-09-09 DIAGNOSIS — D751 Secondary polycythemia: Secondary | ICD-10-CM

## 2015-09-09 LAB — CBC WITH DIFFERENTIAL/PLATELET
BASO%: 0.6 % (ref 0.0–2.0)
BASOS ABS: 0 10*3/uL (ref 0.0–0.1)
EOS ABS: 0.2 10*3/uL (ref 0.0–0.5)
EOS%: 3.7 % (ref 0.0–7.0)
HEMATOCRIT: 52.9 % — AB (ref 38.4–49.9)
HEMOGLOBIN: 18 g/dL — AB (ref 13.0–17.1)
LYMPH#: 1.2 10*3/uL (ref 0.9–3.3)
LYMPH%: 22.5 % (ref 14.0–49.0)
MCH: 28.3 pg (ref 27.2–33.4)
MCHC: 34 g/dL (ref 32.0–36.0)
MCV: 83.2 fL (ref 79.3–98.0)
MONO#: 0.6 10*3/uL (ref 0.1–0.9)
MONO%: 11.6 % (ref 0.0–14.0)
NEUT#: 3.2 10*3/uL (ref 1.5–6.5)
NEUT%: 61.6 % (ref 39.0–75.0)
Platelets: 210 10*3/uL (ref 140–400)
RBC: 6.36 10*6/uL — ABNORMAL HIGH (ref 4.20–5.82)
RDW: 14.5 % (ref 11.0–14.6)
WBC: 5.2 10*3/uL (ref 4.0–10.3)

## 2015-09-09 NOTE — Patient Instructions (Signed)

## 2015-09-09 NOTE — Progress Notes (Signed)
[id number]: Phlebotomy treatment done today per Dr. Alen Blew parameters of over 45% hct. Pt blood output of 525cc, using 16g phlebotomy kit needle. Denies any s/s at this time. Hydration and nutrition offered. Pt post observation of 30 min done. VSS upon discharge.

## 2015-09-13 ENCOUNTER — Other Ambulatory Visit: Payer: Self-pay | Admitting: *Deleted

## 2015-09-13 DIAGNOSIS — D751 Secondary polycythemia: Secondary | ICD-10-CM

## 2015-09-16 ENCOUNTER — Ambulatory Visit (HOSPITAL_BASED_OUTPATIENT_CLINIC_OR_DEPARTMENT_OTHER): Payer: Medicare Other

## 2015-09-16 ENCOUNTER — Other Ambulatory Visit (HOSPITAL_BASED_OUTPATIENT_CLINIC_OR_DEPARTMENT_OTHER): Payer: Medicare Other

## 2015-09-16 VITALS — BP 125/79 | HR 65 | Temp 98.7°F | Resp 18

## 2015-09-16 DIAGNOSIS — D751 Secondary polycythemia: Secondary | ICD-10-CM

## 2015-09-16 LAB — CBC WITH DIFFERENTIAL/PLATELET
BASO%: 0.6 % (ref 0.0–2.0)
Basophils Absolute: 0 10*3/uL (ref 0.0–0.1)
EOS%: 2.5 % (ref 0.0–7.0)
Eosinophils Absolute: 0.2 10*3/uL (ref 0.0–0.5)
HEMATOCRIT: 53.9 % — AB (ref 38.4–49.9)
HGB: 17.8 g/dL — ABNORMAL HIGH (ref 13.0–17.1)
LYMPH#: 1.3 10*3/uL (ref 0.9–3.3)
LYMPH%: 21 % (ref 14.0–49.0)
MCH: 27.3 pg (ref 27.2–33.4)
MCHC: 33 g/dL (ref 32.0–36.0)
MCV: 82.7 fL (ref 79.3–98.0)
MONO#: 0.7 10*3/uL (ref 0.1–0.9)
MONO%: 12.1 % (ref 0.0–14.0)
NEUT#: 3.8 10*3/uL (ref 1.5–6.5)
NEUT%: 63.8 % (ref 39.0–75.0)
Platelets: 240 10*3/uL (ref 140–400)
RBC: 6.51 10*6/uL — AB (ref 4.20–5.82)
RDW: 15.1 % — ABNORMAL HIGH (ref 11.0–14.6)
WBC: 6 10*3/uL (ref 4.0–10.3)

## 2015-09-16 NOTE — Patient Instructions (Signed)

## 2015-09-16 NOTE — Progress Notes (Signed)
Phlebotomy performed per order via L AC.  518g removed without complications.  Pt given drink, denied snack and observed 30 minutes post procedure.  Vitals obtained and remain stable as charted.  Pt discharged ambulatory with no complaints accompanied by wife.

## 2015-09-30 ENCOUNTER — Other Ambulatory Visit (HOSPITAL_BASED_OUTPATIENT_CLINIC_OR_DEPARTMENT_OTHER): Payer: Medicare Other

## 2015-09-30 ENCOUNTER — Ambulatory Visit (HOSPITAL_BASED_OUTPATIENT_CLINIC_OR_DEPARTMENT_OTHER): Payer: Medicare Other

## 2015-09-30 VITALS — BP 115/77 | HR 69 | Temp 98.0°F | Resp 18

## 2015-09-30 DIAGNOSIS — D751 Secondary polycythemia: Secondary | ICD-10-CM | POA: Diagnosis not present

## 2015-09-30 LAB — CBC WITH DIFFERENTIAL/PLATELET
BASO%: 0.8 % (ref 0.0–2.0)
Basophils Absolute: 0.1 10e3/uL (ref 0.0–0.1)
EOS%: 3 % (ref 0.0–7.0)
Eosinophils Absolute: 0.2 10e3/uL (ref 0.0–0.5)
HCT: 51.6 % — ABNORMAL HIGH (ref 38.4–49.9)
HGB: 17.1 g/dL (ref 13.0–17.1)
LYMPH%: 21.1 % (ref 14.0–49.0)
MCH: 27.1 pg — ABNORMAL LOW (ref 27.2–33.4)
MCHC: 33.1 g/dL (ref 32.0–36.0)
MCV: 81.9 fL (ref 79.3–98.0)
MONO#: 0.7 10e3/uL (ref 0.1–0.9)
MONO%: 11.5 % (ref 0.0–14.0)
NEUT#: 3.9 10e3/uL (ref 1.5–6.5)
NEUT%: 63.6 % (ref 39.0–75.0)
Platelets: 262 10e3/uL (ref 140–400)
RBC: 6.3 10e6/uL — ABNORMAL HIGH (ref 4.20–5.82)
RDW: 14.4 % (ref 11.0–14.6)
WBC: 6.2 10e3/uL (ref 4.0–10.3)
lymph#: 1.3 10e3/uL (ref 0.9–3.3)

## 2015-09-30 NOTE — Progress Notes (Signed)
Therapeutic phlebotomy performed per MD order. Per Dr. Hazeline Junker note on 07/19/15 goal is hematocrit less than 45. Today's hematocrit is 51.6. Phlebotomy started at 0859 using 16 gauge phlebotomy set in left [facility] and ending at 0905. Pt tolerated procedure well. Snack offered, pt given a coke to drink per pt request but pt refused snack.  0935: pt monitored 30 minutes post procedure. Pt and VS stable at time of discharge.

## 2015-09-30 NOTE — Patient Instructions (Signed)

## 2015-10-11 ENCOUNTER — Ambulatory Visit (HOSPITAL_BASED_OUTPATIENT_CLINIC_OR_DEPARTMENT_OTHER): Payer: Medicare Other | Admitting: Oncology

## 2015-10-11 ENCOUNTER — Telehealth: Payer: Self-pay | Admitting: Oncology

## 2015-10-11 ENCOUNTER — Other Ambulatory Visit (HOSPITAL_BASED_OUTPATIENT_CLINIC_OR_DEPARTMENT_OTHER): Payer: Medicare Other

## 2015-10-11 ENCOUNTER — Ambulatory Visit (HOSPITAL_BASED_OUTPATIENT_CLINIC_OR_DEPARTMENT_OTHER): Payer: Medicare Other

## 2015-10-11 VITALS — BP 140/81 | HR 65 | Temp 98.2°F | Resp 18 | Ht 69.0 in | Wt 188.8 lb

## 2015-10-11 DIAGNOSIS — D751 Secondary polycythemia: Secondary | ICD-10-CM

## 2015-10-11 LAB — CBC WITH DIFFERENTIAL/PLATELET
BASO%: 0.8 % (ref 0.0–2.0)
BASOS ABS: 0 10*3/uL (ref 0.0–0.1)
EOS ABS: 0.2 10*3/uL (ref 0.0–0.5)
EOS%: 3.5 % (ref 0.0–7.0)
HCT: 50.5 % — ABNORMAL HIGH (ref 38.4–49.9)
HGB: 16.3 g/dL (ref 13.0–17.1)
LYMPH%: 22.2 % (ref 14.0–49.0)
MCH: 26.3 pg — ABNORMAL LOW (ref 27.2–33.4)
MCHC: 32.3 g/dL (ref 32.0–36.0)
MCV: 81.5 fL (ref 79.3–98.0)
MONO#: 0.7 10*3/uL (ref 0.1–0.9)
MONO%: 11.9 % (ref 0.0–14.0)
NEUT%: 61.6 % (ref 39.0–75.0)
NEUTROS ABS: 3.7 10*3/uL (ref 1.5–6.5)
PLATELETS: 254 10*3/uL (ref 140–400)
RBC: 6.2 10*6/uL — AB (ref 4.20–5.82)
RDW: 14.2 % (ref 11.0–14.6)
WBC: 6 10*3/uL (ref 4.0–10.3)
lymph#: 1.3 10*3/uL (ref 0.9–3.3)

## 2015-10-11 NOTE — Progress Notes (Signed)
Hematology and Oncology Follow Up Visit  JAYDAN LOTITO [id number] 21-Jul-1945 70 y.o. 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 The diagnosis dates back to at least 2011.   Prior Therapy: Status post therapeutic phlebotomy in the past on an intermittent basis.  Current therapy: Phlebotomy every 2 weeks to keep his hematocrit less than 45.  Interim History:  Mr. Bazer presents today for a followup visit with his wife. Since the last visit, he has been undergoing phlebotomy every 2 weeks and have tolerated it well. He noticed some mild improvement in his symptoms predominantly decrease in his fatigue and tiredness. He does report sinus headaches but no visual changes. He denied any chest pain or difficulty breathing. He denied any abdominal pain or discomfort.  He continues to be active and perform a lot of work outside. He continues to be on low-dose aspirin without any bleeding or thrombosis. He remains no constitutional symptoms or change in his performance status.   He is not reporting any chest pain shortness of breath cough or hemoptysis. He does not report any dyspnea on exertion or angina. Is not report any nausea or vomiting or abdominal pain. He does not report any genitourinary complaints. He does not report any bleeding complications such as epistaxis hematochezia or melena. His performance status continued to be excellent. Rest of his review of systems unremarkable.  Medications: I have reviewed the patient's current medications.  Current Outpatient Prescriptions  Medication Sig Dispense Refill  . aspirin 81 MG tablet Take 81 mg by mouth daily.    . famotidine (PEPCID) 20 MG tablet Take 20 mg by mouth daily.    Marland Kitchen losartan-hydrochlorothiazide (HYZAAR) 100-25 MG per tablet Take 1 tablet by mouth daily.    . meloxicam (MOBIC) 15 MG tablet Take  15 mg by mouth as needed.     Marland Kitchen oxyCODONE-acetaminophen (PERCOCET/ROXICET) 5-325 MG per tablet Take one or two tablets every 4 to 6 hours a needed for pain 30 tablet 0  . pravastatin (PRAVACHOL) 40 MG tablet Take 40 mg by mouth daily.     No current facility-administered medications for this visit.      Allergies: No Known Allergies  Past Medical History, Surgical history, Social history, and Family History were reviewed and updated.   Blood pressure 140/81, pulse 65, temperature 98.2 F (36.8 C), temperature source Oral, resp. rate 18, height 5\' 9"  (1.753 m), weight 188 lb 12.8 oz (85.6 kg), SpO2 98 %. ECOG: 0 General appearance: Well-appearing gentleman without distress. Head: Normocephalic, without obvious abnormality no oral thrush noted. Neck: no adenopathy Lymph nodes: Cervical, supraclavicular, and axillary nodes normal. Heart:regular rate and rhythm, S1, S2 normal, no murmur, click, rub or gallop Lung:chest clear, no wheezing, rales, normal symmetric air entry Abdomin: soft, non-tender, without masses or organomegaly or shifting dullness or ascites. EXT:no erythema, induration, or nodules Skin: No rashes or lesions.  Neurological examination: no deficits noted.  Lab Results: Lab Results  Component Value Date   WBC 6.0 10/11/2015   HGB 16.3 10/11/2015   HCT 50.5 (H) 10/11/2015   MCV 81.5 10/11/2015   PLT 254 10/11/2015     Chemistry      Component Value Date/Time   NA 141 01/18/2015 0835   K 4.1 01/18/2015 0835   CL 103 05/20/2010 1035   CO2 25 01/18/2015 0835   BUN 21.5 01/18/2015 0835   CREATININE 1.1 01/18/2015 0835  Component Value Date/Time   CALCIUM 9.8 01/18/2015 0835   ALKPHOS 66 01/18/2015 0835   AST 15 01/18/2015 0835   ALT 16 01/18/2015 0835   BILITOT 0.91 01/18/2015 0835         Impression and Plan:  70 year old gentleman with the following issues:  1. Polycythemia: Differential diagnosis include polycythemia vera versus secondary  causes. He has a negative JAK 2 mutation and a normal white cell count and platelets.   His repeat JAK-2 mutation as well as JAK2 exon 12 mutation all appeared negative.  His hemoglobin did respond favorably to phlebotomy and he is less symptomatic. I have recommended to continue phlebotomy at this time and switch to a monthly schedule to keep his hematocrit between 45 and 50 for better tolerance.  They use of cytoreductive agent will be deferred at this time given that the diagnosis of polycythemia appear to be secondary at this time and his risk of thrombosis as well.  2. Thrombosis prophylaxis: Continue be a low-dose aspirin without any bleeding issues.  3. Followup: Will be in 4 months for reevaluation.   [facility], MD 7/28/201710:22 AM

## 2015-10-11 NOTE — Patient Instructions (Signed)

## 2015-10-11 NOTE — Progress Notes (Signed)
Patient ambulates well with no signs and symptoms of dizziness or distress.  Post vital signs stable.  Patient discharged home with no complaints.

## 2015-10-11 NOTE — Telephone Encounter (Signed)
per pof to sch pt appt-gave pt copy of avs °

## 2015-11-11 ENCOUNTER — Ambulatory Visit (HOSPITAL_BASED_OUTPATIENT_CLINIC_OR_DEPARTMENT_OTHER): Payer: Medicare Other

## 2015-11-11 ENCOUNTER — Other Ambulatory Visit (HOSPITAL_BASED_OUTPATIENT_CLINIC_OR_DEPARTMENT_OTHER): Payer: Medicare Other

## 2015-11-11 VITALS — BP 129/84 | HR 69 | Temp 98.2°F | Resp 17

## 2015-11-11 DIAGNOSIS — D751 Secondary polycythemia: Secondary | ICD-10-CM

## 2015-11-11 LAB — CBC WITH DIFFERENTIAL/PLATELET
BASO%: 1 % (ref 0.0–2.0)
Basophils Absolute: 0.1 10*3/uL (ref 0.0–0.1)
EOS ABS: 0.2 10*3/uL (ref 0.0–0.5)
EOS%: 3.1 % (ref 0.0–7.0)
HEMATOCRIT: 51.3 % — AB (ref 38.4–49.9)
HEMOGLOBIN: 16.5 g/dL (ref 13.0–17.1)
LYMPH#: 1.1 10*3/uL (ref 0.9–3.3)
LYMPH%: 18.3 % (ref 14.0–49.0)
MCH: 25.5 pg — ABNORMAL LOW (ref 27.2–33.4)
MCHC: 32.1 g/dL (ref 32.0–36.0)
MCV: 79.5 fL (ref 79.3–98.0)
MONO#: 0.7 10*3/uL (ref 0.1–0.9)
MONO%: 11.6 % (ref 0.0–14.0)
NEUT%: 66 % (ref 39.0–75.0)
NEUTROS ABS: 4 10*3/uL (ref 1.5–6.5)
PLATELETS: 264 10*3/uL (ref 140–400)
RBC: 6.45 10*6/uL — ABNORMAL HIGH (ref 4.20–5.82)
RDW: 14.8 % — ABNORMAL HIGH (ref 11.0–14.6)
WBC: 6 10*3/uL (ref 4.0–10.3)

## 2015-11-11 NOTE — Patient Instructions (Signed)

## 2015-11-11 NOTE — Progress Notes (Signed)
Therapeutic phlebotomy performed via left AC without complications.  500g removed via phlebotomy set.  Pt given beverage but denied snack.  Pt observed for 30 mins post procedure and vitals remain stable as charted.  Pt discharge ambulatory accompanied by wife in no acute distress and without complaints.

## 2015-12-09 ENCOUNTER — Ambulatory Visit (HOSPITAL_BASED_OUTPATIENT_CLINIC_OR_DEPARTMENT_OTHER): Payer: Medicare Other

## 2015-12-09 ENCOUNTER — Other Ambulatory Visit (HOSPITAL_BASED_OUTPATIENT_CLINIC_OR_DEPARTMENT_OTHER): Payer: Medicare Other

## 2015-12-09 VITALS — BP 131/85 | HR 67 | Temp 98.5°F | Resp 16

## 2015-12-09 DIAGNOSIS — D751 Secondary polycythemia: Secondary | ICD-10-CM

## 2015-12-09 LAB — CBC WITH DIFFERENTIAL/PLATELET
BASO%: 1.1 % (ref 0.0–2.0)
Basophils Absolute: 0.1 10*3/uL (ref 0.0–0.1)
EOS%: 2.4 % (ref 0.0–7.0)
Eosinophils Absolute: 0.2 10*3/uL (ref 0.0–0.5)
HEMATOCRIT: 49.9 % (ref 38.4–49.9)
HGB: 16 g/dL (ref 13.0–17.1)
LYMPH#: 1.2 10*3/uL (ref 0.9–3.3)
LYMPH%: 18.5 % (ref 14.0–49.0)
MCH: 24.9 pg — ABNORMAL LOW (ref 27.2–33.4)
MCHC: 32.2 g/dL (ref 32.0–36.0)
MCV: 77.4 fL — ABNORMAL LOW (ref 79.3–98.0)
MONO#: 0.7 10*3/uL (ref 0.1–0.9)
MONO%: 11.6 % (ref 0.0–14.0)
NEUT%: 66.4 % (ref 39.0–75.0)
NEUTROS ABS: 4.2 10*3/uL (ref 1.5–6.5)
Platelets: 277 10*3/uL (ref 140–400)
RBC: 6.45 10*6/uL — AB (ref 4.20–5.82)
RDW: 14.7 % — ABNORMAL HIGH (ref 11.0–14.6)
WBC: 6.3 10*3/uL (ref 4.0–10.3)

## 2015-12-09 NOTE — Patient Instructions (Signed)
Therapeutic Phlebotomy, Care After  Refer to this sheet in the next few weeks. These instructions provide you with information about caring for yourself after your procedure. Your health care provider may also give you more specific instructions. Your treatment has been planned according to current medical practices, but problems sometimes occur. Call your health care provider if you have any problems or questions after your procedure.  WHAT TO EXPECT AFTER THE PROCEDURE  After your procedure, it is common to have:   Light-headedness or dizziness. You may feel faint.   Nausea.   Tiredness.  HOME CARE INSTRUCTIONS  Activities   Return to your normal activities as directed by your health care provider. Most people can go back to their normal activities right away.   Avoid strenuous physical activity and heavy lifting or pulling for about 5 hours after the procedure. Do not lift anything that is heavier than 10 lb (4.5 kg).   Athletes should avoid strenuous exercise for at least 12 hours.   Change positions slowly for the remainder of the day. This will help to prevent light-headedness or fainting.   If you feel light-headed, lie down until the feeling goes away.  Eating and Drinking   Be sure to eat well-balanced meals for the next 24 hours.   Drink enough fluid to keep your urine clear or pale yellow.   Avoid drinking alcohol on the day that you had the procedure.  Care of the Needle Insertion Site   Keep your bandage dry. You can remove the bandage after about 5 hours or as directed by your health care provider.   If you have bleeding from the needle insertion site, elevate your arm and press firmly on the site until the bleeding stops.   If you have bruising at the site, apply ice to the area:   Put ice in a plastic bag.   Place a towel between your skin and the bag.   Leave the ice on for 20 minutes, 2-3 times a day for the first 24 hours.   If the swelling does not go away after 24 hours, apply  a warm, moist washcloth to the area for 20 minutes, 2-3 times a day.  General Instructions   Avoid smoking for at least 30 minutes after the procedure.   Keep all follow-up visits as directed by your health care provider. It is important to continue with further therapeutic phlebotomy treatments as directed.  SEEK MEDICAL CARE IF:   You have redness, swelling, or pain at the needle insertion site.   You have fluid, blood, or pus coming from the needle insertion site.   You feel light-headed, dizzy, or nauseated, and the feeling does not go away.   You notice new bruising at the needle insertion site.   You feel weaker than normal.   You have a fever or chills.  SEEK IMMEDIATE MEDICAL CARE IF:   You have severe nausea or vomiting.   You have chest pain.   You have trouble breathing.    This information is not intended to replace advice given to you by your health care provider. Make sure you discuss any questions you have with your health care provider.    Document Released: 08/04/2010 Document Revised: 07/17/2014 Document Reviewed: 02/26/2014  Elsevier Interactive Patient Education 2016 [employer]

## 2015-12-09 NOTE — Progress Notes (Signed)
Therapeutic Phlebotomy performed with a 16G in left AC. Procedure began at [id number] and ended at 0855 yielding 566g. Patient tolerated well. Beverage provided, nourishment offered.

## 2015-12-30 DIAGNOSIS — Z0000 Encounter for general adult medical examination without abnormal findings: Secondary | ICD-10-CM | POA: Diagnosis not present

## 2015-12-30 DIAGNOSIS — I10 Essential (primary) hypertension: Secondary | ICD-10-CM | POA: Diagnosis not present

## 2015-12-30 DIAGNOSIS — E782 Mixed hyperlipidemia: Secondary | ICD-10-CM | POA: Diagnosis not present

## 2015-12-30 DIAGNOSIS — Z1389 Encounter for screening for other disorder: Secondary | ICD-10-CM | POA: Diagnosis not present

## 2015-12-30 DIAGNOSIS — C61 Malignant neoplasm of prostate: Secondary | ICD-10-CM | POA: Diagnosis not present

## 2015-12-30 DIAGNOSIS — M545 Low back pain: Secondary | ICD-10-CM | POA: Diagnosis not present

## 2015-12-30 DIAGNOSIS — Z23 Encounter for immunization: Secondary | ICD-10-CM | POA: Diagnosis not present

## 2015-12-30 DIAGNOSIS — D751 Secondary polycythemia: Secondary | ICD-10-CM | POA: Diagnosis not present

## 2016-01-06 ENCOUNTER — Other Ambulatory Visit (HOSPITAL_BASED_OUTPATIENT_CLINIC_OR_DEPARTMENT_OTHER): Payer: Medicare Other

## 2016-01-06 ENCOUNTER — Ambulatory Visit (HOSPITAL_BASED_OUTPATIENT_CLINIC_OR_DEPARTMENT_OTHER): Payer: Medicare Other

## 2016-01-06 DIAGNOSIS — D751 Secondary polycythemia: Secondary | ICD-10-CM

## 2016-01-06 LAB — CBC WITH DIFFERENTIAL/PLATELET
BASO%: 0.5 % (ref 0.0–2.0)
Basophils Absolute: 0 10*3/uL (ref 0.0–0.1)
EOS%: 1.2 % (ref 0.0–7.0)
Eosinophils Absolute: 0.1 10*3/uL (ref 0.0–0.5)
HCT: 48.2 % (ref 38.4–49.9)
HEMOGLOBIN: 16 g/dL (ref 13.0–17.1)
LYMPH#: 1.2 10*3/uL (ref 0.9–3.3)
LYMPH%: 19.8 % (ref 14.0–49.0)
MCH: 24.8 pg — AB (ref 27.2–33.4)
MCHC: 33.2 g/dL (ref 32.0–36.0)
MCV: 74.7 fL — AB (ref 79.3–98.0)
MONO#: 0.7 10*3/uL (ref 0.1–0.9)
MONO%: 11.5 % (ref 0.0–14.0)
NEUT#: 4 10*3/uL (ref 1.5–6.5)
NEUT%: 67 % (ref 39.0–75.0)
NRBC: 0 % (ref 0–0)
Platelets: 271 10*3/uL (ref 140–400)
RBC: 6.45 10*6/uL — AB (ref 4.20–5.82)
RDW: 14.7 % — AB (ref 11.0–14.6)
WBC: 5.9 10*3/uL (ref 4.0–10.3)

## 2016-01-06 NOTE — Patient Instructions (Signed)

## 2016-01-24 DIAGNOSIS — C61 Malignant neoplasm of prostate: Secondary | ICD-10-CM | POA: Diagnosis not present

## 2016-02-07 ENCOUNTER — Telehealth: Payer: Self-pay | Admitting: Oncology

## 2016-02-07 ENCOUNTER — Ambulatory Visit (HOSPITAL_BASED_OUTPATIENT_CLINIC_OR_DEPARTMENT_OTHER): Payer: Medicare Other

## 2016-02-07 ENCOUNTER — Telehealth: Payer: Self-pay | Admitting: *Deleted

## 2016-02-07 ENCOUNTER — Other Ambulatory Visit (HOSPITAL_BASED_OUTPATIENT_CLINIC_OR_DEPARTMENT_OTHER): Payer: Medicare Other

## 2016-02-07 ENCOUNTER — Ambulatory Visit (HOSPITAL_BASED_OUTPATIENT_CLINIC_OR_DEPARTMENT_OTHER): Payer: Medicare Other | Admitting: Oncology

## 2016-02-07 VITALS — BP 131/80 | HR 80 | Temp 97.7°F | Resp 16

## 2016-02-07 VITALS — BP 142/87 | HR 74 | Temp 97.6°F | Resp 18 | Ht 69.0 in | Wt 173.6 lb

## 2016-02-07 DIAGNOSIS — D751 Secondary polycythemia: Secondary | ICD-10-CM

## 2016-02-07 DIAGNOSIS — Z7982 Long term (current) use of aspirin: Secondary | ICD-10-CM

## 2016-02-07 LAB — CBC WITH DIFFERENTIAL/PLATELET
BASO%: 0.7 % (ref 0.0–2.0)
BASOS ABS: 0 10*3/uL (ref 0.0–0.1)
EOS%: 1.1 % (ref 0.0–7.0)
Eosinophils Absolute: 0.1 10*3/uL (ref 0.0–0.5)
HEMATOCRIT: 49.7 % (ref 38.4–49.9)
HGB: 15.9 g/dL (ref 13.0–17.1)
LYMPH%: 14.3 % (ref 14.0–49.0)
MCH: 23.8 pg — AB (ref 27.2–33.4)
MCHC: 32 g/dL (ref 32.0–36.0)
MCV: 74.3 fL — AB (ref 79.3–98.0)
MONO#: 0.7 10*3/uL (ref 0.1–0.9)
MONO%: 9.8 % (ref 0.0–14.0)
NEUT#: 5.4 10*3/uL (ref 1.5–6.5)
NEUT%: 74.1 % (ref 39.0–75.0)
Platelets: 302 10*3/uL (ref 140–400)
RBC: 6.69 10*6/uL — ABNORMAL HIGH (ref 4.20–5.82)
RDW: 16.5 % — ABNORMAL HIGH (ref 11.0–14.6)
WBC: 7.3 10*3/uL (ref 4.0–10.3)
lymph#: 1 10*3/uL (ref 0.9–3.3)

## 2016-02-07 NOTE — Patient Instructions (Signed)
     Therapeutic Phlebotomy, Care After Refer to this sheet in the next few weeks. These instructions provide you with information about caring for yourself after your procedure. Your health care provider may also give you more specific instructions. Your treatment has been planned according to current medical practices, but problems sometimes occur. Call your health care provider if you have any problems or questions after your procedure. What can I expect after the procedure? After the procedure, it is common to have:  Light-headedness or dizziness. You may feel faint.  Nausea.  Tiredness. Follow these instructions at home: Activity  Return to your normal activities as directed by your health care provider. Most people can go back to their normal activities right away.  Avoid strenuous physical activity and heavy lifting or pulling for about 5 hours after the procedure. Do not lift anything that is heavier than 10 lb (4.5 kg).  Athletes should avoid strenuous exercise for at least 12 hours.  Change positions slowly for the remainder of the day. This will help to prevent light-headedness or fainting.  If you feel light-headed, lie down until the feeling goes away. Eating and drinking  Be sure to eat well-balanced meals for the next 24 hours.  Drink enough fluid to keep your urine clear or pale yellow.  Avoid drinking alcohol on the day that you had the procedure. Care of the Needle Insertion Site  Keep your bandage dry. You can remove the bandage after about 5 hours or as directed by your health care provider.  If you have bleeding from the needle insertion site, elevate your arm and press firmly on the site until the bleeding stops.  If you have bruising at the site, apply ice to the area:  Put ice in a plastic bag.  Place a towel between your skin and the bag.  Leave the ice on for 20 minutes, 2-3 times a day for the first 24 hours.  If the swelling does not go away  after 24 hours, apply a warm, moist washcloth to the area for 20 minutes, 2-3 times a day. General instructions  Avoid smoking for at least 30 minutes after the procedure.  Keep all follow-up visits as directed by your health care provider. It is important to continue with further therapeutic phlebotomy treatments as directed. Contact a health care provider if:  You have redness, swelling, or pain at the needle insertion site.  You have fluid, blood, or pus coming from the needle insertion site.  You feel light-headed, dizzy, or nauseated, and the feeling does not go away.  You notice new bruising at the needle insertion site.  You feel weaker than normal.  You have a fever or chills. Get help right away if:  You have severe nausea or vomiting.  You have chest pain.  You have trouble breathing. This information is not intended to replace advice given to you by your health care provider. Make sure you discuss any questions you have with your health care provider. Document Released: 08/04/2010 Document Revised: 11/02/2015 Document Reviewed: 02/26/2014 Elsevier Interactive Patient Education  2017 [employer]  

## 2016-02-07 NOTE — Telephone Encounter (Signed)
Message sent to chemo scheduler to add Phlebotomy, per 02/07/16 los. Appointments scheduled, per 02/07/16 los. A copy of the AVS report and appointment schedule was given to the patient, per 02/07/16 los.

## 2016-02-07 NOTE — Telephone Encounter (Signed)
Per LOS I have scheduled appts and notified the scheduler 

## 2016-02-07 NOTE — Progress Notes (Signed)
Per MD note Therapeutic phlebotomy to be performed to keep hematocrit below 45 %. Today's hematocrit 49.7. Therapeutic Phlebotomy performed per MD order, starting at 0902 and finishing at 0917 using 16 gauge phlebotomy set in left [facility] removing a total of 526 grams of blood. Pt tolerated procedure well, snack offered and drink provided. Pt to be monitored 30 minutes post procedure.

## 2016-02-07 NOTE — Progress Notes (Signed)
Hematology and Oncology Follow Up Visit  DRAKO REINERS CL:[id number] 1945/09/12 70 y.o. 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 The diagnosis dates back to at least 2011.   Prior Therapy: Status post therapeutic phlebotomy in the past on an intermittent basis.  Current therapy: Phlebotomy every 4 weeks to keep his hematocrit less than 45.  Interim History:  Mr. Pecor presents today for a followup visit with his wife. Since the last visit, he changes in his health. He reports he has been under pressure related to not health issues and the lost some weight related to that. He has been undergoing phlebotomy every 4 weeks and have tolerated it well. He noticed some mild improvement in his symptoms predominantly decrease in his fatigue and tiredness.  He denied any chest pain or difficulty breathing. He denied any abdominal pain or discomfort.  He continues to be active and perform a lot of work outside. He continues to be on low-dose aspirin without any bleeding or thrombosis. He remains no constitutional symptoms or change in his performance status.   He is not reporting any chest pain shortness of breath cough or hemoptysis. He does not report any dyspnea on exertion or angina. Is not report any nausea or vomiting or abdominal pain. He does not report any genitourinary complaints. He does not report any bleeding complications such as epistaxis hematochezia or melena. His performance status continued to be excellent. Rest of his review of systems unremarkable.  Medications: I have reviewed the patient's current medications.  Current Outpatient Prescriptions  Medication Sig Dispense Refill  . aspirin 81 MG tablet Take 81 mg by mouth daily.    . famotidine (PEPCID) 20 MG tablet Take 20 mg by mouth daily.    Marland Kitchen losartan-hydrochlorothiazide (HYZAAR) 100-25  MG per tablet Take 1 tablet by mouth daily.    . meloxicam (MOBIC) 15 MG tablet Take 15 mg by mouth as needed.     Marland Kitchen oxyCODONE-acetaminophen (PERCOCET/ROXICET) 5-325 MG per tablet Take one or two tablets every 4 to 6 hours a needed for pain 30 tablet 0  . pravastatin (PRAVACHOL) 40 MG tablet Take 40 mg by mouth daily.     No current facility-administered medications for this visit.      Allergies: No Known Allergies  Past Medical History, Surgical history, Social history, and Family History were reviewed and updated.   Blood pressure (!) 142/87, pulse 74, temperature 97.6 F (36.4 C), temperature source Oral, resp. rate 18, height 5\' 9"  (1.753 m), weight 173 lb 9.6 oz (78.7 kg), SpO2 98 %. ECOG: 0 General appearance: Alert, awake gentleman without distress. Head: Normocephalic, without obvious abnormality no oral ulcers or lesions. Neck: no adenopathy Lymph nodes: Cervical, supraclavicular, and axillary nodes normal. Heart:regular rate and rhythm, S1, S2 normal, no murmur, click, rub or gallop Lung:chest clear, no wheezing, rales, normal symmetric air entry Abdomin: soft, non-tender, without masses or organomegaly or rebound or guarding. EXT:no erythema, induration, or nodules Skin: No rashes or lesions.  Neurological examination: no deficits noted.  Lab Results: Lab Results  Component Value Date   WBC 7.3 02/07/2016   HGB 15.9 02/07/2016   HCT 49.7 02/07/2016   MCV 74.3 (L) 02/07/2016   PLT 302 02/07/2016     Chemistry      Component Value Date/Time   NA 141 01/18/2015 0835   K 4.1 01/18/2015 0835   CL 103 05/20/2010  1035   CO2 25 01/18/2015 0835   BUN 21.5 01/18/2015 0835   CREATININE 1.1 01/18/2015 0835      Component Value Date/Time   CALCIUM 9.8 01/18/2015 0835   ALKPHOS 66 01/18/2015 0835   AST 15 01/18/2015 0835   ALT 16 01/18/2015 0835   BILITOT 0.91 01/18/2015 0835         Impression and Plan:  70 year old gentleman with the following  issues:  1. Polycythemia: Differential diagnosis include polycythemia vera versus secondary causes. He has a negative JAK 2 mutation and a normal white cell count and platelets.   His repeat JAK-2 mutation as well as JAK2 exon 12 mutation all appeared negative.  His hemoglobin did respond favorably to phlebotomy and he is less symptomatic. I have recommended to continue phlebotomy at this time and switch to a every other month schedule to keep his hematocrit between 45 and 50 for better tolerance.   2. Thrombosis prophylaxis: Continue be a low-dose aspirin without any bleeding issues.  3. Followup: Will be in 4 months for reevaluation.   [facility], MD 11/24/20178:41 AM

## 2016-03-26 DIAGNOSIS — L814 Other melanin hyperpigmentation: Secondary | ICD-10-CM | POA: Diagnosis not present

## 2016-03-26 DIAGNOSIS — L821 Other seborrheic keratosis: Secondary | ICD-10-CM | POA: Diagnosis not present

## 2016-03-26 DIAGNOSIS — L570 Actinic keratosis: Secondary | ICD-10-CM | POA: Diagnosis not present

## 2016-04-10 ENCOUNTER — Ambulatory Visit (HOSPITAL_BASED_OUTPATIENT_CLINIC_OR_DEPARTMENT_OTHER): Payer: Medicare Other

## 2016-04-10 ENCOUNTER — Other Ambulatory Visit (HOSPITAL_BASED_OUTPATIENT_CLINIC_OR_DEPARTMENT_OTHER): Payer: Medicare Other

## 2016-04-10 VITALS — BP 99/68 | HR 65 | Temp 97.9°F | Resp 18

## 2016-04-10 DIAGNOSIS — D751 Secondary polycythemia: Secondary | ICD-10-CM

## 2016-04-10 LAB — CBC WITH DIFFERENTIAL/PLATELET
BASO%: 0.8 % (ref 0.0–2.0)
BASOS ABS: 0 10*3/uL (ref 0.0–0.1)
EOS ABS: 0.1 10*3/uL (ref 0.0–0.5)
EOS%: 1.3 % (ref 0.0–7.0)
HEMATOCRIT: 47.2 % (ref 38.4–49.9)
HEMOGLOBIN: 15.2 g/dL (ref 13.0–17.1)
LYMPH#: 1.2 10*3/uL (ref 0.9–3.3)
LYMPH%: 17.8 % (ref 14.0–49.0)
MCH: 23.7 pg — AB (ref 27.2–33.4)
MCHC: 32.3 g/dL (ref 32.0–36.0)
MCV: 73.5 fL — AB (ref 79.3–98.0)
MONO#: 0.7 10*3/uL (ref 0.1–0.9)
MONO%: 10.6 % (ref 0.0–14.0)
NEUT#: 4.5 10*3/uL (ref 1.5–6.5)
NEUT%: 69.5 % (ref 39.0–75.0)
PLATELETS: 274 10*3/uL (ref 140–400)
RBC: 6.42 10*6/uL — ABNORMAL HIGH (ref 4.20–5.82)
RDW: 17.4 % — AB (ref 11.0–14.6)
WBC: 6.5 10*3/uL (ref 4.0–10.3)

## 2016-04-10 NOTE — Patient Instructions (Signed)
     Therapeutic Phlebotomy, Care After Refer to this sheet in the next few weeks. These instructions provide you with information about caring for yourself after your procedure. Your health care provider may also give you more specific instructions. Your treatment has been planned according to current medical practices, but problems sometimes occur. Call your health care provider if you have any problems or questions after your procedure. What can I expect after the procedure? After the procedure, it is common to have:  Light-headedness or dizziness. You may feel faint.  Nausea.  Tiredness. Follow these instructions at home: Activity  Return to your normal activities as directed by your health care provider. Most people can go back to their normal activities right away.  Avoid strenuous physical activity and heavy lifting or pulling for about 5 hours after the procedure. Do not lift anything that is heavier than 10 lb (4.5 kg).  Athletes should avoid strenuous exercise for at least 12 hours.  Change positions slowly for the remainder of the day. This will help to prevent light-headedness or fainting.  If you feel light-headed, lie down until the feeling goes away. Eating and drinking  Be sure to eat well-balanced meals for the next 24 hours.  Drink enough fluid to keep your urine clear or pale yellow.  Avoid drinking alcohol on the day that you had the procedure. Care of the Needle Insertion Site  Keep your bandage dry. You can remove the bandage after about 5 hours or as directed by your health care provider.  If you have bleeding from the needle insertion site, elevate your arm and press firmly on the site until the bleeding stops.  If you have bruising at the site, apply ice to the area:  Put ice in a plastic bag.  Place a towel between your skin and the bag.  Leave the ice on for 20 minutes, 2-3 times a day for the first 24 hours.  If the swelling does not go away  after 24 hours, apply a warm, moist washcloth to the area for 20 minutes, 2-3 times a day. General instructions  Avoid smoking for at least 30 minutes after the procedure.  Keep all follow-up visits as directed by your health care provider. It is important to continue with further therapeutic phlebotomy treatments as directed. Contact a health care provider if:  You have redness, swelling, or pain at the needle insertion site.  You have fluid, blood, or pus coming from the needle insertion site.  You feel light-headed, dizzy, or nauseated, and the feeling does not go away.  You notice new bruising at the needle insertion site.  You feel weaker than normal.  You have a fever or chills. Get help right away if:  You have severe nausea or vomiting.  You have chest pain.  You have trouble breathing. This information is not intended to replace advice given to you by your health care provider. Make sure you discuss any questions you have with your health care provider. Document Released: 08/04/2010 Document Revised: 11/02/2015 Document Reviewed: 02/26/2014 Elsevier Interactive Patient Education  2017 [employer]  

## 2016-04-10 NOTE — Progress Notes (Signed)
1300: Therapeutic phlebotomy performed per order using phlebotomy kit to left AC over approximately 7 minutes.  Pt tolerated procedure well. Coke given per pt request, refused offer of food.    Pt monitored for 30 minutes post phlebotomy.  VSS, ambulatory and in no acute distress at time of discharge.

## 2016-05-12 ENCOUNTER — Telehealth: Payer: Self-pay | Admitting: Oncology

## 2016-05-12 NOTE — Telephone Encounter (Signed)
Confirmed with patient the appointment changes that were made

## 2016-06-05 ENCOUNTER — Ambulatory Visit (HOSPITAL_BASED_OUTPATIENT_CLINIC_OR_DEPARTMENT_OTHER): Payer: Medicare Other | Admitting: Oncology

## 2016-06-05 ENCOUNTER — Other Ambulatory Visit (HOSPITAL_BASED_OUTPATIENT_CLINIC_OR_DEPARTMENT_OTHER): Payer: Medicare Other

## 2016-06-05 ENCOUNTER — Ambulatory Visit (HOSPITAL_BASED_OUTPATIENT_CLINIC_OR_DEPARTMENT_OTHER): Payer: Medicare Other

## 2016-06-05 ENCOUNTER — Telehealth: Payer: Self-pay | Admitting: Oncology

## 2016-06-05 VITALS — BP 116/71 | HR 72 | Temp 98.1°F | Resp 16

## 2016-06-05 VITALS — BP 139/75 | HR 69 | Temp 98.2°F | Resp 18 | Wt 180.4 lb

## 2016-06-05 DIAGNOSIS — D751 Secondary polycythemia: Secondary | ICD-10-CM

## 2016-06-05 LAB — CBC WITH DIFFERENTIAL/PLATELET
BASO%: 0.9 % (ref 0.0–2.0)
Basophils Absolute: 0.1 10*3/uL (ref 0.0–0.1)
EOS%: 2.6 % (ref 0.0–7.0)
Eosinophils Absolute: 0.2 10*3/uL (ref 0.0–0.5)
HCT: 51.3 % — ABNORMAL HIGH (ref 38.4–49.9)
HGB: 16.6 g/dL (ref 13.0–17.1)
LYMPH#: 1.4 10*3/uL (ref 0.9–3.3)
LYMPH%: 22 % (ref 14.0–49.0)
MCH: 23.8 pg — ABNORMAL LOW (ref 27.2–33.4)
MCHC: 32.3 g/dL (ref 32.0–36.0)
MCV: 73.6 fL — ABNORMAL LOW (ref 79.3–98.0)
MONO#: 0.8 10*3/uL (ref 0.1–0.9)
MONO%: 12.8 % (ref 0.0–14.0)
NEUT#: 3.9 10*3/uL (ref 1.5–6.5)
NEUT%: 61.7 % (ref 39.0–75.0)
Platelets: 306 10*3/uL (ref 140–400)
RBC: 6.97 10*6/uL — AB (ref 4.20–5.82)
RDW: 17.8 % — ABNORMAL HIGH (ref 11.0–14.6)
WBC: 6.2 10*3/uL (ref 4.0–10.3)

## 2016-06-05 NOTE — Telephone Encounter (Signed)
Gave patient AVS and calender per 06/05/2016 los.  

## 2016-06-05 NOTE — Progress Notes (Signed)
Hematology and Oncology Follow Up Visit  [name] [id number] [birth year] 71 y.o. 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 The diagnosis dates back to at least 2011.   Prior Therapy: Status post therapeutic phlebotomy in the past on an intermittent basis.  Current therapy: Phlebotomy every 4 weeks to keep his hematocrit less than 45.  Interim History:  [name] presents today for a followup visit. Since the last visit, he reports feeling reasonably well without any recent complaints. He has been receiving therapeutic phlebotomy every other month which she has tolerated reasonably well. He denied any recent thrombosis or bleeding episodes. He continues on aspirin without any recent complaints. He denied any chest pain or difficulty breathing. He denied any abdominal pain or discomfort.  He continues to be active and perform a lot of work outside.    He is not reporting any chest pain shortness of breath cough or hemoptysis. He does not report any dyspnea on exertion or angina. Is not report any nausea or vomiting or abdominal pain. He does not report any genitourinary complaints. He does not report any bleeding complications such as epistaxis hematochezia or melena. His performance status continued to be excellent. Rest of his review of systems unremarkable.  Medications: I have reviewed the patient's current medications.  Current Outpatient Prescriptions  Medication Sig Dispense Refill  . aspirin 81 MG tablet Take 81 mg by mouth daily.    . famotidine (PEPCID) 20 MG tablet Take 20 mg by mouth daily.    Marland Kitchen losartan-hydrochlorothiazide (HYZAAR) 100-25 MG per tablet Take 1 tablet by mouth daily.    . meloxicam (MOBIC) 15 MG tablet Take 15 mg by mouth as needed.     . pravastatin (PRAVACHOL) 40 MG tablet Take 40 mg by mouth daily.     No current  facility-administered medications for this visit.      Allergies:  Allergies  Allergen Reactions  . Oxycodone-Acetaminophen Other (See Comments)    Patient stated,"it keeps me wide awake and I can't sleep."    Past Medical History, Surgical history, Social history, and Family History were reviewed and updated.   Blood pressure 139/75, pulse 69, temperature 98.2 F (36.8 C), temperature source Oral, resp. rate 18, weight 180 lb 6.4 oz (81.8 kg), SpO2 98 %. ECOG: 0 General appearance: Head: Normocephalic, without obvious abnormality no Oropharyngeal masses or lesions. Neck: no adenopathy or thyroid masses. Lymph nodes: Cervical, supraclavicular, and axillary nodes normal. Heart:regular rate and rhythm, S1, S2 normal, no murmur, click, rub or gallop Lung:chest clear, no wheezing, rales, normal symmetric air entry Abdomin: soft, non-tender, without masses or organomegaly or shifting dullness or ascites. EXT:no erythema, induration, or nodules Skin: No rashes or lesions.  Neurological examination: no deficits noted.  Lab Results: Lab Results  Component Value Date   WBC 6.2 06/05/2016   HGB 16.6 06/05/2016   HCT 51.3 (H) 06/05/2016   MCV 73.6 (L) 06/05/2016   PLT 306 06/05/2016     Chemistry      Component Value Date/Time   NA 141 01/18/2015 0835   K 4.1 01/18/2015 0835   CL 103 05/20/2010 1035   CO2 25 01/18/2015 0835   BUN 21.5 01/18/2015 0835   CREATININE 1.1 01/18/2015 0835      Component Value Date/Time   CALCIUM 9.8 01/18/2015 0835   ALKPHOS 66 01/18/2015 0835   AST 15 01/18/2015 0835  ALT 16 01/18/2015 0835   BILITOT 0.91 01/18/2015 0835         Impression and Plan:  71 year old gentleman with the following issues:  1. Polycythemia: Differential diagnosis include polycythemia vera versus secondary causes. He has a negative JAK 2 mutation and a normal white cell count and platelets.   His repeat JAK-2 mutation as well as JAK2 exon 12 mutation all  appeared negative.  His hemoglobin remained under reasonable control with the current phlebotomy schedule. The plan is to continue with every other month schedule for the time being. The frequency can change depending on his tolerance and hemoglobin level in the future. The goal is to continue to keep his hematocrit close to 45.   2. Thrombosis prophylaxis: Continue be a low-dose aspirin without any bleeding issues.  3. Followup: Will be in 4 months.    [facility], MD 3/23/201812:20 PM

## 2016-08-03 ENCOUNTER — Ambulatory Visit (HOSPITAL_BASED_OUTPATIENT_CLINIC_OR_DEPARTMENT_OTHER): Payer: Medicare Other

## 2016-08-03 ENCOUNTER — Other Ambulatory Visit (HOSPITAL_BASED_OUTPATIENT_CLINIC_OR_DEPARTMENT_OTHER): Payer: Medicare Other

## 2016-08-03 DIAGNOSIS — D751 Secondary polycythemia: Secondary | ICD-10-CM

## 2016-08-03 DIAGNOSIS — R972 Elevated prostate specific antigen [PSA]: Secondary | ICD-10-CM | POA: Diagnosis not present

## 2016-08-03 DIAGNOSIS — Z8546 Personal history of malignant neoplasm of prostate: Secondary | ICD-10-CM | POA: Diagnosis not present

## 2016-08-03 LAB — CBC WITH DIFFERENTIAL/PLATELET
BASO%: 0.4 % (ref 0.0–2.0)
BASOS ABS: 0 10*3/uL (ref 0.0–0.1)
EOS ABS: 0.2 10*3/uL (ref 0.0–0.5)
EOS%: 3.7 % (ref 0.0–7.0)
HEMATOCRIT: 49.9 % (ref 38.4–49.9)
HEMOGLOBIN: 15.6 g/dL (ref 13.0–17.1)
LYMPH#: 1.2 10*3/uL (ref 0.9–3.3)
LYMPH%: 22.6 % (ref 14.0–49.0)
MCH: 23.7 pg — AB (ref 27.2–33.4)
MCHC: 31.3 g/dL — ABNORMAL LOW (ref 32.0–36.0)
MCV: 75.8 fL — ABNORMAL LOW (ref 79.3–98.0)
MONO#: 0.6 10*3/uL (ref 0.1–0.9)
MONO%: 10.9 % (ref 0.0–14.0)
NEUT#: 3.2 10*3/uL (ref 1.5–6.5)
NEUT%: 62.4 % (ref 39.0–75.0)
PLATELETS: 254 10*3/uL (ref 140–400)
RBC: 6.58 10*6/uL — ABNORMAL HIGH (ref 4.20–5.82)
RDW: 17.1 % — AB (ref 11.0–14.6)
WBC: 5.1 10*3/uL (ref 4.0–10.3)

## 2016-08-03 NOTE — Progress Notes (Signed)
Therapeutic Phlebotomy performed per MD order, (Hematocrit 49.9) using 16 gauge phlebotomy set in left AC. Pt tolerated procedure well. Coke provided, pt refused snack. Pt to be monitored 30 minutes post procedure.  Pt monitored 30 minutes post procedure, pt and VS stable at discharge.

## 2016-08-03 NOTE — Patient Instructions (Signed)
Therapeutic Phlebotomy Therapeutic phlebotomy is the controlled removal of blood from a person's body for the purpose of treating a medical condition. The procedure is similar to donating blood. Usually, about a pint (470 mL, or 0.47L) of blood is removed. The average adult has 9-12 pints (4.3-5.7 L) of blood. Therapeutic phlebotomy may be used to treat the following medical conditions:  Hemochromatosis. This is a condition in which the blood contains too much iron.  Polycythemia vera. This is a condition in which the blood contains too many red blood cells.  Porphyria cutanea tarda. This is a disease in which an important part of hemoglobin is not made properly. It results in the buildup of abnormal amounts of porphyrins in the body.  Sickle cell disease. This is a condition in which the red blood cells form an abnormal crescent shape rather than a round shape. Tell a health care provider about:  Any allergies you have.  All medicines you are taking, including vitamins, herbs, eye drops, creams, and over-the-counter medicines.  Any problems you or family members have had with anesthetic medicines.  Any blood disorders you have.  Any surgeries you have had.  Any medical conditions you have. What are the risks? Generally, this is a safe procedure. However, problems may occur, including:  Nausea or light-headedness.  Low blood pressure.  Soreness, bleeding, swelling, or bruising at the needle insertion site.  Infection. What happens before the procedure?  Follow instructions from your health care provider about eating or drinking restrictions.  Ask your health care provider about changing or stopping your regular medicines. This is especially important if you are taking diabetes medicines or blood thinners.  Wear clothing with sleeves that can be raised above the elbow.  Plan to have someone take you home after the procedure.  You may have a blood sample taken. What happens  during the procedure?  A needle will be inserted into one of your veins.  Tubing and a collection bag will be attached to that needle.  Blood will flow through the needle and tubing into the collection bag.  You may be asked to open and close your hand slowly and continually during the entire collection.  After the specified amount of blood has been removed from your body, the collection bag and tubing will be clamped.  The needle will be removed from your vein.  Pressure will be held on the site of the needle insertion to stop the bleeding.  A bandage (dressing) will be placed over the needle insertion site. The procedure may vary among health care providers and hospitals. What happens after the procedure?  Your recovery will be assessed and monitored.  You can return to your normal activities as directed by your health care provider. This information is not intended to replace advice given to you by your health care provider. Make sure you discuss any questions you have with your health care provider. Document Released: 08/04/2010 Document Revised: 11/02/2015 Document Reviewed: 02/26/2014 Elsevier Interactive Patient Education  2017 [employer] Inc.  

## 2016-10-02 ENCOUNTER — Other Ambulatory Visit (HOSPITAL_BASED_OUTPATIENT_CLINIC_OR_DEPARTMENT_OTHER): Payer: Medicare Other

## 2016-10-02 ENCOUNTER — Telehealth: Payer: Self-pay | Admitting: Oncology

## 2016-10-02 ENCOUNTER — Ambulatory Visit (HOSPITAL_BASED_OUTPATIENT_CLINIC_OR_DEPARTMENT_OTHER): Payer: Medicare Other

## 2016-10-02 ENCOUNTER — Ambulatory Visit (HOSPITAL_BASED_OUTPATIENT_CLINIC_OR_DEPARTMENT_OTHER): Payer: Medicare Other | Admitting: Oncology

## 2016-10-02 VITALS — BP 137/73 | HR 64 | Temp 98.6°F | Resp 18 | Ht 69.0 in | Wt 181.9 lb

## 2016-10-02 DIAGNOSIS — D751 Secondary polycythemia: Secondary | ICD-10-CM | POA: Diagnosis not present

## 2016-10-02 LAB — CBC WITH DIFFERENTIAL/PLATELET
BASO%: 0.6 % (ref 0.0–2.0)
Basophils Absolute: 0 10*3/uL (ref 0.0–0.1)
EOS ABS: 0.1 10*3/uL (ref 0.0–0.5)
EOS%: 2.6 % (ref 0.0–7.0)
HEMATOCRIT: 50.2 % — AB (ref 38.4–49.9)
HEMOGLOBIN: 16 g/dL (ref 13.0–17.1)
LYMPH#: 1.1 10*3/uL (ref 0.9–3.3)
LYMPH%: 21.2 % (ref 14.0–49.0)
MCH: 24.2 pg — ABNORMAL LOW (ref 27.2–33.4)
MCHC: 31.9 g/dL — ABNORMAL LOW (ref 32.0–36.0)
MCV: 75.8 fL — ABNORMAL LOW (ref 79.3–98.0)
MONO#: 0.6 10*3/uL (ref 0.1–0.9)
MONO%: 12 % (ref 0.0–14.0)
NEUT%: 63.6 % (ref 39.0–75.0)
NEUTROS ABS: 3.2 10*3/uL (ref 1.5–6.5)
PLATELETS: 237 10*3/uL (ref 140–400)
RBC: 6.62 10*6/uL — ABNORMAL HIGH (ref 4.20–5.82)
RDW: 17 % — ABNORMAL HIGH (ref 11.0–14.6)
WBC: 5 10*3/uL (ref 4.0–10.3)

## 2016-10-02 NOTE — Patient Instructions (Signed)

## 2016-10-02 NOTE — Progress Notes (Signed)
Grayland Jack presents today for phlebotomy per MD orders. Phlebotomy procedure started at 0947 and ended at 0955. 600 grams removed.  Patient tolerated procedure well. IV needle removed intact. Pressure dressing applied.  Refreshments provided. Written discharge instructions given. Pt declined to wait 30 minutes post phlebotomy.

## 2016-10-02 NOTE — Progress Notes (Signed)
Hematology and Oncology Follow Up Visit  [name] [id number] [birth year] 71 y.o. 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 The diagnosis dates back to at least 2011.   Prior Therapy: Status post therapeutic phlebotomy in the past on an intermittent basis.  Current therapy: Phlebotomy every 8 weeks to keep his hematocrit less than 45.  Interim History:  [name] presents today for a followup visit. Since the last visit, he reports no changes in his health. He continues to receive phlebotomy every 2 months and has tolerated it well. He denied any recent thrombosis or bleeding episodes. He denied any constitutional symptoms of weight loss. He remains active and attends activities of daily living. He does report periodic headaches which are unchanged at this time. He continues on aspirin without any recent complaints. He denied any chest pain or difficulty breathing. He denied any abdominal pain or discomfort.    He is not reporting any chest pain shortness of breath cough or hemoptysis. He does not report any dyspnea on exertion or angina. Is not report any nausea or vomiting or abdominal pain. He does not report any genitourinary complaints. He does not report any bleeding complications such as epistaxis hematochezia or melena. His performance status is unchanged. Rest of his review of systems unremarkable.  Medications: I have reviewed the patient's current medications.  Current Outpatient Prescriptions  Medication Sig Dispense Refill  . aspirin 81 MG tablet Take 81 mg by mouth daily.    . famotidine (PEPCID) 20 MG tablet Take 20 mg by mouth daily.    Marland Kitchen losartan-hydrochlorothiazide (HYZAAR) 100-25 MG per tablet Take 1 tablet by mouth daily.    . meloxicam (MOBIC) 15 MG tablet Take 15 mg by mouth as needed.     . pravastatin (PRAVACHOL) 40 MG tablet Take  40 mg by mouth daily.     No current facility-administered medications for this visit.      Allergies:  Allergies  Allergen Reactions  . Oxycodone-Acetaminophen Other (See Comments)    Patient stated,"it keeps me wide awake and I can't sleep."    Past Medical History, Surgical history, Social history, and Family History were reviewed and updated.   Blood pressure 137/73, pulse 64, temperature 98.6 F (37 C), temperature source Oral, resp. rate 18, height 5\' 9"  (1.753 m), weight 181 lb 14.4 oz (82.5 kg), SpO2 96 %. ECOG: 0 General appearance:Well-appearing gentleman without distress. Head: Normocephalic, without obvious abnormality no thrush or ulcers. Neck: no adenopathy or thyroid masses. Lymph nodes: Cervical, supraclavicular, and axillary nodes normal. Heart:regular rate and rhythm, S1, S2 normal, no murmur, click, rub or gallop Lung:chest clear, no wheezing, rales, normal symmetric air entry Abdomin: soft, non-tender, without masses or organomegaly or rebound or guarding. EXT:no erythema, induration, or nodules Skin: No rashes or lesions.  Neurological examination: no deficits noted.  Lab Results: Lab Results  Component Value Date   WBC 5.0 10/02/2016   HGB 16.0 10/02/2016   HCT 50.2 (H) 10/02/2016   MCV 75.8 (L) 10/02/2016   PLT 237 10/02/2016     Chemistry      Component Value Date/Time   NA 141 01/18/2015 0835   K 4.1 01/18/2015 0835   CL 103 05/20/2010 1035   CO2 25 01/18/2015 0835   BUN 21.5 01/18/2015 0835   CREATININE 1.1 01/18/2015 0835      Component Value Date/Time   CALCIUM 9.8 01/18/2015  0835   ALKPHOS 66 01/18/2015 0835   AST 15 01/18/2015 0835   ALT 16 01/18/2015 0835   BILITOT 0.91 01/18/2015 0835         Impression and Plan:  71 year old gentleman with the following issues:  1. Polycythemia: Differential diagnosis include polycythemia vera versus secondary causes. He has a negative JAK 2 mutation and a normal white cell count and  platelets.   His repeat JAK-2 mutation as well as JAK2 exon 12 mutation all appeared negative.  His hemoglobin remained under reasonable control with the current phlebotomy schedule. The plan is to continue with every other month schedule for the time being. He has no objections to continuing at this time.   2. Thrombosis prophylaxis: Continue be a low-dose aspirin without any bleeding issues.  3. Followup: Will be every 2 months for phlebotomy and in an M.D. follow-up in 6 months.    [facility], MD 7/20/20188:44 AM

## 2016-10-02 NOTE — Telephone Encounter (Signed)
Gave patient avs report and appointments for September thru January 2019.

## 2016-10-16 DIAGNOSIS — I10 Essential (primary) hypertension: Secondary | ICD-10-CM | POA: Diagnosis not present

## 2016-10-16 DIAGNOSIS — C61 Malignant neoplasm of prostate: Secondary | ICD-10-CM | POA: Diagnosis not present

## 2016-10-16 DIAGNOSIS — E7800 Pure hypercholesterolemia, unspecified: Secondary | ICD-10-CM | POA: Diagnosis not present

## 2016-11-30 ENCOUNTER — Inpatient Hospital Stay: Payer: Medicare Other | Attending: Oncology

## 2016-11-30 ENCOUNTER — Inpatient Hospital Stay (HOSPITAL_BASED_OUTPATIENT_CLINIC_OR_DEPARTMENT_OTHER): Payer: Medicare Other

## 2016-11-30 VITALS — BP 124/83 | HR 65 | Temp 98.6°F | Resp 18

## 2016-11-30 DIAGNOSIS — D751 Secondary polycythemia: Secondary | ICD-10-CM

## 2016-11-30 LAB — CBC WITH DIFFERENTIAL/PLATELET
BASO%: 0.5 % (ref 0.0–2.0)
BASOS ABS: 0 10*3/uL (ref 0.0–0.1)
EOS ABS: 0.2 10*3/uL (ref 0.0–0.5)
EOS%: 3.1 % (ref 0.0–7.0)
HCT: 50.7 % — ABNORMAL HIGH (ref 38.4–49.9)
HEMOGLOBIN: 16.3 g/dL (ref 13.0–17.1)
LYMPH%: 17.7 % (ref 14.0–49.0)
MCH: 24.4 pg — ABNORMAL LOW (ref 27.2–33.4)
MCHC: 32.1 g/dL (ref 32.0–36.0)
MCV: 75.8 fL — ABNORMAL LOW (ref 79.3–98.0)
MONO#: 0.7 10*3/uL (ref 0.1–0.9)
MONO%: 12.7 % (ref 0.0–14.0)
NEUT%: 66 % (ref 39.0–75.0)
NEUTROS ABS: 3.8 10*3/uL (ref 1.5–6.5)
PLATELETS: 239 10*3/uL (ref 140–400)
RBC: 6.69 10*6/uL — AB (ref 4.20–5.82)
RDW: 16.6 % — ABNORMAL HIGH (ref 11.0–14.6)
WBC: 5.8 10*3/uL (ref 4.0–10.3)
lymph#: 1 10*3/uL (ref 0.9–3.3)

## 2016-11-30 NOTE — Patient Instructions (Signed)

## 2016-11-30 NOTE — Progress Notes (Signed)
Pt refused to stay 30 min post phlebotomy.  Pt educated on importance of staying the full 30 minutes but pt states he felt okay to leave.  Pt given a soda to drink and discharged home.

## 2016-12-28 DIAGNOSIS — H5203 Hypermetropia, bilateral: Secondary | ICD-10-CM | POA: Diagnosis not present

## 2016-12-28 DIAGNOSIS — H04123 Dry eye syndrome of bilateral lacrimal glands: Secondary | ICD-10-CM | POA: Diagnosis not present

## 2016-12-28 DIAGNOSIS — H52223 Regular astigmatism, bilateral: Secondary | ICD-10-CM | POA: Diagnosis not present

## 2016-12-28 DIAGNOSIS — H40001 Preglaucoma, unspecified, right eye: Secondary | ICD-10-CM | POA: Diagnosis not present

## 2016-12-28 DIAGNOSIS — H11153 Pinguecula, bilateral: Secondary | ICD-10-CM | POA: Diagnosis not present

## 2016-12-28 DIAGNOSIS — H353132 Nonexudative age-related macular degeneration, bilateral, intermediate dry stage: Secondary | ICD-10-CM | POA: Diagnosis not present

## 2016-12-28 DIAGNOSIS — H2513 Age-related nuclear cataract, bilateral: Secondary | ICD-10-CM | POA: Diagnosis not present

## 2016-12-28 DIAGNOSIS — H11423 Conjunctival edema, bilateral: Secondary | ICD-10-CM | POA: Diagnosis not present

## 2016-12-28 DIAGNOSIS — H524 Presbyopia: Secondary | ICD-10-CM | POA: Diagnosis not present

## 2016-12-28 DIAGNOSIS — H18413 Arcus senilis, bilateral: Secondary | ICD-10-CM | POA: Diagnosis not present

## 2016-12-28 DIAGNOSIS — H25013 Cortical age-related cataract, bilateral: Secondary | ICD-10-CM | POA: Diagnosis not present

## 2017-01-04 ENCOUNTER — Encounter (INDEPENDENT_AMBULATORY_CARE_PROVIDER_SITE_OTHER): Payer: Medicare Other | Admitting: Ophthalmology

## 2017-01-04 DIAGNOSIS — I10 Essential (primary) hypertension: Secondary | ICD-10-CM | POA: Diagnosis not present

## 2017-01-04 DIAGNOSIS — H35033 Hypertensive retinopathy, bilateral: Secondary | ICD-10-CM | POA: Diagnosis not present

## 2017-01-04 DIAGNOSIS — H43813 Vitreous degeneration, bilateral: Secondary | ICD-10-CM

## 2017-01-04 DIAGNOSIS — H2513 Age-related nuclear cataract, bilateral: Secondary | ICD-10-CM

## 2017-01-04 DIAGNOSIS — H353122 Nonexudative age-related macular degeneration, left eye, intermediate dry stage: Secondary | ICD-10-CM

## 2017-01-25 ENCOUNTER — Inpatient Hospital Stay: Payer: Medicare Other | Attending: Oncology

## 2017-01-25 ENCOUNTER — Inpatient Hospital Stay (HOSPITAL_BASED_OUTPATIENT_CLINIC_OR_DEPARTMENT_OTHER): Payer: Medicare Other

## 2017-01-25 VITALS — BP 115/77 | HR 63 | Temp 98.7°F | Resp 18

## 2017-01-25 DIAGNOSIS — D751 Secondary polycythemia: Secondary | ICD-10-CM | POA: Diagnosis present

## 2017-01-25 LAB — CBC WITH DIFFERENTIAL/PLATELET
BASO%: 0.3 % (ref 0.0–2.0)
BASOS ABS: 0 10*3/uL (ref 0.0–0.1)
EOS ABS: 0.2 10*3/uL (ref 0.0–0.5)
EOS%: 3.7 % (ref 0.0–7.0)
HCT: 52.5 % — ABNORMAL HIGH (ref 38.4–49.9)
HGB: 16.7 g/dL (ref 13.0–17.1)
LYMPH%: 17.3 % (ref 14.0–49.0)
MCH: 24.5 pg — AB (ref 27.2–33.4)
MCHC: 31.8 g/dL — AB (ref 32.0–36.0)
MCV: 77.1 fL — AB (ref 79.3–98.0)
MONO#: 0.9 10*3/uL (ref 0.1–0.9)
MONO%: 13.3 % (ref 0.0–14.0)
NEUT#: 4.2 10*3/uL (ref 1.5–6.5)
NEUT%: 65.4 % (ref 39.0–75.0)
PLATELETS: 265 10*3/uL (ref 140–400)
RBC: 6.81 10*6/uL — AB (ref 4.20–5.82)
RDW: 17.3 % — ABNORMAL HIGH (ref 11.0–14.6)
WBC: 6.5 10*3/uL (ref 4.0–10.3)
lymph#: 1.1 10*3/uL (ref 0.9–3.3)

## 2017-01-25 NOTE — Progress Notes (Signed)
Therapeutic phlebotomy started at 8:25 am with phlebotomy kit and and ended at 8:35 am with 500 grams removed.  VSS, pt tolerated phlebotomy with no issues, pt monitored for 30 min post and offered a snack.  Pt discharged home with no complications.

## 2017-01-25 NOTE — Patient Instructions (Signed)

## 2017-02-08 DIAGNOSIS — C61 Malignant neoplasm of prostate: Secondary | ICD-10-CM | POA: Diagnosis not present

## 2017-02-08 DIAGNOSIS — I10 Essential (primary) hypertension: Secondary | ICD-10-CM | POA: Diagnosis not present

## 2017-02-15 DIAGNOSIS — I10 Essential (primary) hypertension: Secondary | ICD-10-CM | POA: Diagnosis not present

## 2017-02-15 DIAGNOSIS — Z0000 Encounter for general adult medical examination without abnormal findings: Secondary | ICD-10-CM | POA: Diagnosis not present

## 2017-02-15 DIAGNOSIS — E7800 Pure hypercholesterolemia, unspecified: Secondary | ICD-10-CM | POA: Diagnosis not present

## 2017-03-21 ENCOUNTER — Telehealth: Payer: Self-pay | Admitting: Oncology

## 2017-03-21 NOTE — Telephone Encounter (Signed)
LVM ADVISING APPT CHGD FROM 1/11 TO 1/17 @ 1.15PM.

## 2017-03-24 ENCOUNTER — Telehealth: Payer: Self-pay | Admitting: Oncology

## 2017-03-24 NOTE — Telephone Encounter (Signed)
Spoke with patient regarding appts and scheduled with next available per 1/8 sch msg.

## 2017-03-25 ENCOUNTER — Inpatient Hospital Stay (HOSPITAL_BASED_OUTPATIENT_CLINIC_OR_DEPARTMENT_OTHER): Payer: Medicare Other | Admitting: Oncology

## 2017-03-25 ENCOUNTER — Telehealth: Payer: Self-pay | Admitting: Oncology

## 2017-03-25 ENCOUNTER — Inpatient Hospital Stay: Payer: Medicare Other | Attending: Oncology

## 2017-03-25 ENCOUNTER — Other Ambulatory Visit: Payer: Self-pay | Admitting: *Deleted

## 2017-03-25 ENCOUNTER — Inpatient Hospital Stay: Payer: Medicare Other

## 2017-03-25 VITALS — BP 148/86 | HR 63 | Temp 98.4°F | Resp 18 | Ht 69.0 in | Wt 186.0 lb

## 2017-03-25 DIAGNOSIS — D751 Secondary polycythemia: Secondary | ICD-10-CM | POA: Diagnosis not present

## 2017-03-25 DIAGNOSIS — L821 Other seborrheic keratosis: Secondary | ICD-10-CM | POA: Diagnosis not present

## 2017-03-25 DIAGNOSIS — D1801 Hemangioma of skin and subcutaneous tissue: Secondary | ICD-10-CM | POA: Diagnosis not present

## 2017-03-25 DIAGNOSIS — R635 Abnormal weight gain: Secondary | ICD-10-CM

## 2017-03-25 DIAGNOSIS — L814 Other melanin hyperpigmentation: Secondary | ICD-10-CM | POA: Diagnosis not present

## 2017-03-25 DIAGNOSIS — M542 Cervicalgia: Secondary | ICD-10-CM | POA: Diagnosis not present

## 2017-03-25 DIAGNOSIS — R51 Headache: Secondary | ICD-10-CM | POA: Diagnosis not present

## 2017-03-25 DIAGNOSIS — L570 Actinic keratosis: Secondary | ICD-10-CM | POA: Diagnosis not present

## 2017-03-25 DIAGNOSIS — D229 Melanocytic nevi, unspecified: Secondary | ICD-10-CM | POA: Diagnosis not present

## 2017-03-25 LAB — CBC WITH DIFFERENTIAL/PLATELET
Basophils Absolute: 0.1 10*3/uL (ref 0.0–0.1)
Basophils Relative: 1 %
EOS ABS: 0.3 10*3/uL (ref 0.0–0.5)
EOS PCT: 5 %
HCT: 52.5 % — ABNORMAL HIGH (ref 38.4–49.9)
HEMOGLOBIN: 17.1 g/dL (ref 13.0–17.1)
LYMPHS ABS: 1.1 10*3/uL (ref 0.9–3.3)
Lymphocytes Relative: 18 %
MCH: 24.1 pg — AB (ref 27.2–33.4)
MCHC: 32.5 g/dL (ref 32.0–36.0)
MCV: 74.1 fL — ABNORMAL LOW (ref 79.3–98.0)
MONOS PCT: 15 %
Monocytes Absolute: 0.9 10*3/uL (ref 0.1–0.9)
Neutro Abs: 3.9 10*3/uL (ref 1.5–6.5)
Neutrophils Relative %: 61 %
PLATELETS: 279 10*3/uL (ref 140–400)
RBC: 7.09 MIL/uL — ABNORMAL HIGH (ref 4.20–5.82)
RDW: 17.4 % — ABNORMAL HIGH (ref 11.0–15.6)
WBC: 6.3 10*3/uL (ref 4.0–10.3)

## 2017-03-25 NOTE — Patient Instructions (Signed)

## 2017-03-25 NOTE — Telephone Encounter (Signed)
Scheduled appt per 1/10 los - Gave patient AVS And calender per los.

## 2017-03-25 NOTE — Progress Notes (Signed)
Phlebotomy procedure charted under phlebotomy therapeutic flowsheet. Pt tolerated procedure well, but required three IV accesses today.

## 2017-03-25 NOTE — Progress Notes (Signed)
Hematology and Oncology Follow Up Visit  Reyes Reyes [id number] 19-Jun-1945 72 y.o. 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 The diagnosis dates back to at least 2011.   Prior Therapy: Status post therapeutic phlebotomy in the past on an intermittent basis.  Current therapy: Phlebotomy every 3 months to keep his hematocrit less than 45.  Interim History:  Reyes Reyes presents today for a followup visit.  He reports no major complaints compared to the last visit.  He continues to receive phlebotomy every 2 months and tolerates it without issues.  He denied any worsening vascular headaches, chest pain or difficulty breathing.  He denies any thrombosis or bleeding episodes.  He denies any complications related to the procedure itself.  He does report occasional neck pain and headaches associated with that but no other neurological deficits.  He denied any early satiety or abdominal pain.  He has excellent appetite and have gained weight.  His performance status is unchanged.  He does not report any neurological deficits, syncope or seizures.  He does not report any fevers or chills or sweats.  He is not reporting any chest pain shortness of breath cough or hemoptysis. He does not report any dyspnea on exertion or angina. Is not report any nausea or vomiting or abdominal pain. He does not report any genitourinary complaints. He does not report any bleeding complications such as epistaxis hematochezia or melena.  No report any skeletal complaints of joint tenderness.  He does not report any petechiae or rash.  Rest of his review of systems is negative.  Medications: I have reviewed the patient's current medications.  Current Outpatient Medications  Medication Sig Dispense Refill  . aspirin 81 MG tablet Take 81 mg by mouth daily.    . famotidine (PEPCID) 20 MG tablet Take 20 mg by  mouth daily.    Marland Kitchen losartan-hydrochlorothiazide (HYZAAR) 100-25 MG per tablet Take 1 tablet by mouth daily.    . meloxicam (MOBIC) 15 MG tablet Take 15 mg by mouth as needed.     . pravastatin (PRAVACHOL) 40 MG tablet Take 40 mg by mouth daily.     No current facility-administered medications for this visit.      Allergies:  Allergies  Allergen Reactions  . Oxycodone-Acetaminophen Other (See Comments)    Patient stated,"it keeps me wide awake and I can't sleep."    Past Medical History, Surgical history, Social history, and Family History remain unchanged and updated today.   Blood pressure (!) 148/86, pulse 63, temperature 98.4 F (36.9 C), temperature source Oral, resp. rate 18, height 5\' 9"  (1.753 m), weight 186 lb (84.4 kg), SpO2 97 %. ECOG: 0 General appearance: Alert, awake gentleman without distress. Head: Normocephalic, without obvious abnormality  Oropharynx: No oral ulcers or thrush. Eyes: Sclera anicteric.  Slightly injected. Lymph nodes: Cervical, supraclavicular, and axillary nodes normal. Heart:regular rate and rhythm, S1, S2 normal, no murmur, click, rub or gallop Lung: Clear all throughout lung fields.  No wheezes or dullness to percussion. Abdomin: soft, non-tender, without masses or organomegaly no shifting dullness or ascites. Musculoskeletal: No joint edema or effusion. Skin: No rashes or lesions.  Neurological examination: no motor or sensory deficits.  Lab Results: Lab Results  Component Value Date   WBC 6.3 03/25/2017   HGB 17.1 03/25/2017   HCT 52.5 (H) 03/25/2017   MCV 74.1 (L) 03/25/2017   PLT 279 03/25/2017  Chemistry      Component Value Date/Time   NA 141 01/18/2015 0835   K 4.1 01/18/2015 0835   CL 103 05/20/2010 1035   CO2 25 01/18/2015 0835   BUN 21.5 01/18/2015 0835   CREATININE 1.1 01/18/2015 0835      Component Value Date/Time   CALCIUM 9.8 01/18/2015 0835   ALKPHOS 66 01/18/2015 0835   AST 15 01/18/2015 0835   ALT 16  01/18/2015 0835   BILITOT 0.91 01/18/2015 0835         Impression and Plan:  72 year old gentleman with the following issues:  1. Polycythemia: Related to secondary causes without any evidence to suggest polycythemia vera.  His repeat JAK-2 mutation as well as JAK2 exon 12 mutation all appeared negative.  His hemoglobin was reviewed today and continues to be under reasonable range and control.  Risks and benefits associated with continuing phlebotomy was discussed today.  He is agreeable to continue at every 12-month basis.  This interval is more tolerable and will be able to achieve the goal of maintaining his hemoglobin.   2. Thrombosis prophylaxis: Recent thrombosis history noted.  He will continue on aspirin to prevent that moving forward.  3.  Weight gain: No evidence to suggest endocrinopathy or complication associated with polycythemia.  4. Followup: Will be every 3 months for phlebotomy and in an M.D. follow-up in 6 months.   15 minutes was spent today face-to-face with the patient.  More than 50% of that time was spent in coordination, education and discussing treatment strategy associated with his condition.  [name], Reyes Reyes 1/10/20199:32 AM

## 2017-03-26 ENCOUNTER — Ambulatory Visit: Payer: Federal, State, Local not specified - PPO | Admitting: Oncology

## 2017-03-26 ENCOUNTER — Inpatient Hospital Stay: Payer: Medicare Other

## 2017-04-01 ENCOUNTER — Other Ambulatory Visit: Payer: Federal, State, Local not specified - PPO

## 2017-04-01 ENCOUNTER — Inpatient Hospital Stay: Payer: Medicare Other | Admitting: Oncology

## 2017-04-01 ENCOUNTER — Inpatient Hospital Stay: Payer: Medicare Other

## 2017-05-20 DIAGNOSIS — R509 Fever, unspecified: Secondary | ICD-10-CM | POA: Diagnosis not present

## 2017-05-20 DIAGNOSIS — R05 Cough: Secondary | ICD-10-CM | POA: Diagnosis not present

## 2017-05-20 DIAGNOSIS — J019 Acute sinusitis, unspecified: Secondary | ICD-10-CM | POA: Diagnosis not present

## 2017-05-20 DIAGNOSIS — D45 Polycythemia vera: Secondary | ICD-10-CM | POA: Diagnosis not present

## 2017-05-20 DIAGNOSIS — J329 Chronic sinusitis, unspecified: Secondary | ICD-10-CM | POA: Diagnosis not present

## 2017-05-20 DIAGNOSIS — J399 Disease of upper respiratory tract, unspecified: Secondary | ICD-10-CM | POA: Diagnosis not present

## 2017-05-20 DIAGNOSIS — E7800 Pure hypercholesterolemia, unspecified: Secondary | ICD-10-CM | POA: Diagnosis not present

## 2017-05-20 DIAGNOSIS — H109 Unspecified conjunctivitis: Secondary | ICD-10-CM | POA: Diagnosis not present

## 2017-05-20 DIAGNOSIS — I10 Essential (primary) hypertension: Secondary | ICD-10-CM | POA: Diagnosis not present

## 2017-06-09 DIAGNOSIS — J019 Acute sinusitis, unspecified: Secondary | ICD-10-CM | POA: Diagnosis not present

## 2017-06-21 ENCOUNTER — Inpatient Hospital Stay: Payer: Medicare Other

## 2017-06-21 ENCOUNTER — Inpatient Hospital Stay: Payer: Medicare Other | Attending: Oncology

## 2017-06-21 DIAGNOSIS — D751 Secondary polycythemia: Secondary | ICD-10-CM | POA: Insufficient documentation

## 2017-06-21 LAB — CBC WITH DIFFERENTIAL (CANCER CENTER ONLY)
Basophils Absolute: 0.1 10*3/uL (ref 0.0–0.1)
Basophils Relative: 1 %
EOS ABS: 0.2 10*3/uL (ref 0.0–0.5)
Eosinophils Relative: 2 %
HCT: 51.4 % — ABNORMAL HIGH (ref 38.4–49.9)
Hemoglobin: 16.4 g/dL (ref 13.0–17.1)
LYMPHS ABS: 1.2 10*3/uL (ref 0.9–3.3)
Lymphocytes Relative: 17 %
MCH: 23.2 pg — AB (ref 27.2–33.4)
MCHC: 31.9 g/dL — ABNORMAL LOW (ref 32.0–36.0)
MCV: 72.7 fL — ABNORMAL LOW (ref 79.3–98.0)
MONO ABS: 0.8 10*3/uL (ref 0.1–0.9)
Monocytes Relative: 11 %
Neutro Abs: 4.7 10*3/uL (ref 1.5–6.5)
Neutrophils Relative %: 69 %
PLATELETS: 307 10*3/uL (ref 140–400)
RBC: 7.07 MIL/uL — AB (ref 4.20–5.82)
RDW: 18.3 % — AB (ref 11.0–14.6)
WBC: 6.8 10*3/uL (ref 4.0–10.3)

## 2017-06-21 NOTE — Patient Instructions (Signed)

## 2017-06-21 NOTE — Progress Notes (Signed)
[name] presents today for phlebotomy per MD orders. LAC accessed with 18G needle. Phlebotomy procedure started at 09:44 and ended at 09:58. 500 grams removed and IV needle removed intact. VSS and Patient tolerated procedure well. Pt declined to stay for 30 minute post observation. Ambulated out without complication

## 2017-08-16 DIAGNOSIS — I10 Essential (primary) hypertension: Secondary | ICD-10-CM | POA: Diagnosis not present

## 2017-08-23 ENCOUNTER — Other Ambulatory Visit (HOSPITAL_COMMUNITY): Payer: Self-pay | Admitting: Respiratory Therapy

## 2017-08-23 DIAGNOSIS — E7800 Pure hypercholesterolemia, unspecified: Secondary | ICD-10-CM | POA: Diagnosis not present

## 2017-08-23 DIAGNOSIS — R05 Cough: Secondary | ICD-10-CM

## 2017-08-23 DIAGNOSIS — R053 Chronic cough: Secondary | ICD-10-CM

## 2017-08-23 DIAGNOSIS — I10 Essential (primary) hypertension: Secondary | ICD-10-CM | POA: Diagnosis not present

## 2017-08-30 ENCOUNTER — Ambulatory Visit (HOSPITAL_COMMUNITY)
Admission: RE | Admit: 2017-08-30 | Discharge: 2017-08-30 | Disposition: A | Discharge status: Home / Self Care | Payer: Medicare Other | Source: Ambulatory Visit | Attending: Internal Medicine | Admitting: Internal Medicine

## 2017-08-30 DIAGNOSIS — R05 Cough: Secondary | ICD-10-CM | POA: Diagnosis not present

## 2017-08-30 DIAGNOSIS — D751 Secondary polycythemia: Secondary | ICD-10-CM | POA: Insufficient documentation

## 2017-08-30 DIAGNOSIS — J449 Chronic obstructive pulmonary disease, unspecified: Secondary | ICD-10-CM | POA: Diagnosis not present

## 2017-08-30 DIAGNOSIS — R053 Chronic cough: Secondary | ICD-10-CM

## 2017-08-30 DIAGNOSIS — Z87891 Personal history of nicotine dependence: Secondary | ICD-10-CM | POA: Insufficient documentation

## 2017-08-30 LAB — PULMONARY FUNCTION TEST
DL/VA % pred: 96 %
DL/VA: 4.39 ml/min/mmHg/L
DLCO COR % PRED: 89 %
DLCO COR: 27.85 ml/min/mmHg
DLCO UNC: 29.17 ml/min/mmHg
DLCO unc % pred: 94 %
FEF 25-75 POST: 3.46 L/s
FEF 25-75 PRE: 2.16 L/s
FEF2575-%Change-Post: 60 %
FEF2575-%PRED-PRE: 92 %
FEF2575-%Pred-Post: 148 %
FEV1-%Change-Post: 7 %
FEV1-%PRED-POST: 106 %
FEV1-%PRED-PRE: 98 %
FEV1-POST: 3.28 L
FEV1-Pre: 3.05 L
FEV1FVC-%Change-Post: 5 %
FEV1FVC-%Pred-Pre: 101 %
FEV6-%CHANGE-POST: 4 %
FEV6-%PRED-POST: 104 %
FEV6-%Pred-Pre: 100 %
FEV6-POST: 4.14 L
FEV6-Pre: 3.97 L
FEV6FVC-%CHANGE-POST: 2 %
FEV6FVC-%PRED-POST: 104 %
FEV6FVC-%Pred-Pre: 102 %
FVC-%Change-Post: 2 %
FVC-%PRED-POST: 99 %
FVC-%PRED-PRE: 97 %
FVC-POST: 4.2 L
FVC-PRE: 4.12 L
POST FEV6/FVC RATIO: 99 %
PRE FEV1/FVC RATIO: 74 %
Post FEV1/FVC ratio: 78 %
Pre FEV6/FVC Ratio: 97 %
RV % pred: 102 %
RV: 2.48 L
TLC % PRED: 99 %
TLC: 6.77 L

## 2017-08-30 MED ORDER — ALBUTEROL SULFATE (2.5 MG/3ML) 0.083% IN NEBU
2.5000 mg | INHALATION_SOLUTION | Freq: Once | RESPIRATORY_TRACT | Status: AC
  Administered 2017-08-30: 2.5 mg via RESPIRATORY_TRACT

## 2017-09-20 DIAGNOSIS — J302 Other seasonal allergic rhinitis: Secondary | ICD-10-CM | POA: Diagnosis not present

## 2017-09-20 DIAGNOSIS — I10 Essential (primary) hypertension: Secondary | ICD-10-CM | POA: Diagnosis not present

## 2017-09-20 DIAGNOSIS — E7800 Pure hypercholesterolemia, unspecified: Secondary | ICD-10-CM | POA: Diagnosis not present

## 2017-09-20 DIAGNOSIS — R0981 Nasal congestion: Secondary | ICD-10-CM | POA: Diagnosis not present

## 2017-09-20 DIAGNOSIS — R05 Cough: Secondary | ICD-10-CM | POA: Diagnosis not present

## 2017-09-27 DIAGNOSIS — J302 Other seasonal allergic rhinitis: Secondary | ICD-10-CM | POA: Diagnosis not present

## 2017-09-27 DIAGNOSIS — R05 Cough: Secondary | ICD-10-CM | POA: Diagnosis not present

## 2017-09-27 DIAGNOSIS — R0981 Nasal congestion: Secondary | ICD-10-CM | POA: Diagnosis not present

## 2017-10-01 ENCOUNTER — Inpatient Hospital Stay: Payer: Medicare Other | Attending: Oncology | Admitting: Oncology

## 2017-10-01 ENCOUNTER — Telehealth: Payer: Self-pay | Admitting: Oncology

## 2017-10-01 ENCOUNTER — Inpatient Hospital Stay: Payer: Medicare Other

## 2017-10-01 VITALS — BP 137/84 | HR 71 | Temp 98.9°F | Resp 17 | Ht 69.0 in | Wt 182.1 lb

## 2017-10-01 VITALS — BP 129/78 | HR 64

## 2017-10-01 DIAGNOSIS — D751 Secondary polycythemia: Secondary | ICD-10-CM

## 2017-10-01 DIAGNOSIS — Z7982 Long term (current) use of aspirin: Secondary | ICD-10-CM | POA: Diagnosis not present

## 2017-10-01 LAB — CBC WITH DIFFERENTIAL (CANCER CENTER ONLY)
Basophils Absolute: 0 10*3/uL (ref 0.0–0.1)
Basophils Relative: 1 %
EOS ABS: 0.2 10*3/uL (ref 0.0–0.5)
Eosinophils Relative: 3 %
HEMATOCRIT: 52.6 % — AB (ref 38.4–49.9)
HEMOGLOBIN: 16.5 g/dL (ref 13.0–17.1)
LYMPHS ABS: 1.5 10*3/uL (ref 0.9–3.3)
Lymphocytes Relative: 25 %
MCH: 23 pg — ABNORMAL LOW (ref 27.2–33.4)
MCHC: 31.4 g/dL — ABNORMAL LOW (ref 32.0–36.0)
MCV: 73.3 fL — ABNORMAL LOW (ref 79.3–98.0)
Monocytes Absolute: 0.7 10*3/uL (ref 0.1–0.9)
Monocytes Relative: 11 %
NEUTROS ABS: 3.7 10*3/uL (ref 1.5–6.5)
NEUTROS PCT: 60 %
Platelet Count: 291 10*3/uL (ref 140–400)
RBC: 7.18 MIL/uL — AB (ref 4.20–5.82)
RDW: 18.3 % — ABNORMAL HIGH (ref 11.0–14.6)
WBC: 6 10*3/uL (ref 4.0–10.3)

## 2017-10-01 NOTE — Progress Notes (Signed)
Therapeutic Phlebotomy for 500 gm via L antecubital without difficulty per order to keep HCT around 45 per Dr Alen Blew.  Started at 10:12 am & ended at 10:30 am.  Tol well.  Refreshments offered but pt denied & refused to stay 30 min after phlebotomy for observation & hydration.  Informed to drink & eat well today.  Pt expressed understanding & tol well.

## 2017-10-01 NOTE — Patient Instructions (Signed)

## 2017-10-01 NOTE — Progress Notes (Signed)
Hematology and Oncology Follow Up Visit  [name] [id number] 12-Dec-1945 72 y.o. 10/01/2017 8:32 AM Jani Gravel, MDKim, Jeneen Rinks, MD   Principle Diagnosis: 72 year old man with polycythemia  2011.  He is Jak 2 mutation is negative and the etiology is related to secondary causes versus polycythemia vera.   Prior Therapy: Status post therapeutic phlebotomy in the past on an intermittent basis.  Current therapy: Phlebotomy every 3 months to keep his hematocrit less than 45.  Interim History:  [name] is here for a follow-up visit.  Since the last visit, he reports no major changes in his health.  He continues to receive phlebotomy intermittently every 3 months without complications.  He denies any changes with or without the phlebotomy.  He does report some sinus congestion which causes headache periodically but no visual changes, dizziness or falls.  Continues to be active and attends to activities of daily living.  He tolerates a phlebotomy well although his IV access has been an issue at times.  He does not report any headaches, blurry vision, syncope or seizures.  He denies any alteration of mental status or confusion.  He does not report any fevers or chills or sweats.  He is not reporting any chest pain shortness of breath cough or hemoptysis. He does not report any dyspnea on exertion or angina. Is not report any nausea or vomiting.  Denies any early satiety or distention.  He does not report any frequency urgency or hesitancy. He does not report any bleeding or clotting tendencies.  No report any arthralgias or myalgias.  He does not report any petechiae or rash.  Rest of his review of systems is negative.  Medications: I have reviewed the patient's current medications.  Current Outpatient Medications  Medication Sig Dispense Refill  . aspirin 81 MG tablet Take 81 mg by mouth daily.    . famotidine (PEPCID) 20 MG tablet Take 20 mg by mouth daily.    Marland Kitchen losartan-hydrochlorothiazide  (HYZAAR) 100-25 MG per tablet Take 1 tablet by mouth daily.    . meloxicam (MOBIC) 15 MG tablet Take 15 mg by mouth as needed.     . pravastatin (PRAVACHOL) 40 MG tablet Take 40 mg by mouth daily.     No current facility-administered medications for this visit.      Allergies:  Allergies  Allergen Reactions  . Oxycodone-Acetaminophen Other (See Comments)    Patient stated,"it keeps me wide awake and I can't sleep."    Past Medical History, Surgical history, Social history, and Family History remain unchanged and updated today.  Physical exam: Blood pressure 137/84, pulse 71, temperature 98.9 F (37.2 C), temperature source Oral, resp. rate 17, height 5\' 9"  (1.753 m), weight 182 lb 1.6 oz (82.6 kg), SpO2 97 %.   ECOG: 0 General appearance: Well-appearing gentleman without distress. Head: Atraumatic without abnormalities. Oropharynx: Mucous membranes are moist and pink. Eyes: Pupils are equal and round reactive to light. Lymph nodes: No lymphadenopathy noted in the cervical, supraclavicular, or axillary nodes. Heart:regular rate and rhythm, S1, S2.  No murmurs or gallops. Lung: Clear without any rhonchi, wheezes or dullness to percussion. Abdomin: Soft, nontender without any rebound or guarding.  No shifting dullness or ascites. Musculoskeletal: No clubbing or cyanosis. Skin: No skin rashes or lesions. Neurological: No motor or sensory deficits.  Lab Results: Lab Results  Component Value Date   WBC 6.0 10/01/2017   HGB 16.5 10/01/2017   HCT 52.6 (H) 10/01/2017   MCV 73.3 (L) 10/01/2017  PLT 291 10/01/2017     Chemistry      Component Value Date/Time   NA 141 01/18/2015 0835   K 4.1 01/18/2015 0835   CL 103 05/20/2010 1035   CO2 25 01/18/2015 0835   BUN 21.5 01/18/2015 0835   CREATININE 1.1 01/18/2015 0835      Component Value Date/Time   CALCIUM 9.8 01/18/2015 0835   ALKPHOS 66 01/18/2015 0835   AST 15 01/18/2015 0835   ALT 16 01/18/2015 0835   BILITOT 0.91  01/18/2015 0835         Impression and Plan:  72 year old gentleman with the following issues:  1. Polycythemia: The etiology likely related to a myeloproliferative disorder versus secondary causes despite negative Jak 2 mutation.  The natural course of this disease and risks and benefits of continuing phlebotomy was reviewed today.  He is tolerating phlebotomy reasonably well although he is having difficulties with IV access.  The plan is to continue with phlebotomy with increase interval every 6 months to keep his hemoglobin close to 45.  He is agreeable with this plan and will increase the frequency if his hemoglobin becomes out of control.   2. Thrombosis prophylaxis: No recent thrombosis noted.  He remains on aspirin.  3.  Sinus congestion: He is following with his primary care physician regarding this issue.  4. Followup: Will be in 6 months for repeat laboratory testing.  15 minutes was spent today face-to-face with the patient.  More than 50% of that time was spent in coordination, education and reviewing his natural course of disease and treatment options.  Zola Button, MD 7/19/20198:32 AM

## 2017-10-01 NOTE — Telephone Encounter (Signed)
Gave patient avs and calendar of upcoming jan appts. °

## 2017-12-29 DIAGNOSIS — R05 Cough: Secondary | ICD-10-CM | POA: Diagnosis not present

## 2018-01-03 DIAGNOSIS — R05 Cough: Secondary | ICD-10-CM | POA: Diagnosis not present

## 2018-01-10 ENCOUNTER — Encounter (INDEPENDENT_AMBULATORY_CARE_PROVIDER_SITE_OTHER): Payer: Medicare Other | Admitting: Ophthalmology

## 2018-01-10 DIAGNOSIS — E785 Hyperlipidemia, unspecified: Secondary | ICD-10-CM | POA: Diagnosis not present

## 2018-01-10 DIAGNOSIS — R739 Hyperglycemia, unspecified: Secondary | ICD-10-CM | POA: Diagnosis not present

## 2018-01-14 DIAGNOSIS — G8929 Other chronic pain: Secondary | ICD-10-CM | POA: Diagnosis not present

## 2018-01-14 DIAGNOSIS — Z125 Encounter for screening for malignant neoplasm of prostate: Secondary | ICD-10-CM | POA: Diagnosis not present

## 2018-01-14 DIAGNOSIS — I10 Essential (primary) hypertension: Secondary | ICD-10-CM | POA: Diagnosis not present

## 2018-01-14 DIAGNOSIS — R002 Palpitations: Secondary | ICD-10-CM | POA: Diagnosis not present

## 2018-01-14 DIAGNOSIS — R739 Hyperglycemia, unspecified: Secondary | ICD-10-CM | POA: Diagnosis not present

## 2018-01-14 DIAGNOSIS — M549 Dorsalgia, unspecified: Secondary | ICD-10-CM | POA: Diagnosis not present

## 2018-01-14 DIAGNOSIS — R05 Cough: Secondary | ICD-10-CM | POA: Diagnosis not present

## 2018-01-14 DIAGNOSIS — E785 Hyperlipidemia, unspecified: Secondary | ICD-10-CM | POA: Diagnosis not present

## 2018-01-17 DIAGNOSIS — R05 Cough: Secondary | ICD-10-CM | POA: Diagnosis not present

## 2018-01-28 DIAGNOSIS — R05 Cough: Secondary | ICD-10-CM | POA: Diagnosis not present

## 2018-03-01 DIAGNOSIS — R05 Cough: Secondary | ICD-10-CM | POA: Diagnosis not present

## 2018-04-08 ENCOUNTER — Telehealth: Payer: Self-pay | Admitting: Oncology

## 2018-04-08 ENCOUNTER — Inpatient Hospital Stay: Payer: Medicare Other

## 2018-04-08 ENCOUNTER — Inpatient Hospital Stay: Payer: Medicare Other | Attending: Oncology

## 2018-04-08 ENCOUNTER — Inpatient Hospital Stay (HOSPITAL_BASED_OUTPATIENT_CLINIC_OR_DEPARTMENT_OTHER): Payer: Medicare Other | Admitting: Oncology

## 2018-04-08 VITALS — BP 159/84 | HR 75 | Temp 98.4°F | Resp 16

## 2018-04-08 VITALS — BP 138/82 | HR 74 | Temp 98.2°F | Resp 17 | Ht 69.0 in | Wt 178.3 lb

## 2018-04-08 DIAGNOSIS — D751 Secondary polycythemia: Secondary | ICD-10-CM

## 2018-04-08 LAB — CBC WITH DIFFERENTIAL (CANCER CENTER ONLY)
Abs Immature Granulocytes: 0.02 10*3/uL (ref 0.00–0.07)
BASOS PCT: 1 %
Basophils Absolute: 0.1 10*3/uL (ref 0.0–0.1)
EOS ABS: 0.1 10*3/uL (ref 0.0–0.5)
Eosinophils Relative: 2 %
HCT: 56.5 % — ABNORMAL HIGH (ref 39.0–52.0)
Hemoglobin: 16.6 g/dL (ref 13.0–17.0)
Immature Granulocytes: 0 %
Lymphocytes Relative: 15 %
Lymphs Abs: 1 10*3/uL (ref 0.7–4.0)
MCH: 20.6 pg — AB (ref 26.0–34.0)
MCHC: 29.4 g/dL — AB (ref 30.0–36.0)
MCV: 70 fL — AB (ref 80.0–100.0)
MONO ABS: 0.8 10*3/uL (ref 0.1–1.0)
MONOS PCT: 12 %
NRBC: 0 % (ref 0.0–0.2)
Neutro Abs: 4.7 10*3/uL (ref 1.7–7.7)
Neutrophils Relative %: 70 %
PLATELETS: 395 10*3/uL (ref 150–400)
RBC: 8.07 MIL/uL — AB (ref 4.22–5.81)
RDW: 22.5 % — AB (ref 11.5–15.5)
WBC: 6.7 10*3/uL (ref 4.0–10.5)

## 2018-04-08 NOTE — Telephone Encounter (Signed)
Gave AVS and calendar for July

## 2018-04-08 NOTE — Patient Instructions (Signed)

## 2018-04-08 NOTE — Progress Notes (Signed)
Hematology and Oncology Follow Up Visit  [name] [id number] 1945/12/03 73 y.o. 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  His work-up for myeloproliferative disorder and polycythemia vera has been negative.   Prior Therapy: Therapeutic phlebotomy as needed.  Current therapy: Phlebotomy every 6 months to keep his hematocrit less than 45.  Interim History:  [name] is here for repeat evaluation.  Since the last visit, he has tolerated phlebotomy without any major issues.  He does have few other complaints including dyspepsia persistent burping associated with occasional changes in his bowel habits.  He has had a colonoscopy in the past but nothing recent.  He also had some complaints of postnasal drip and nasal congestion.  He denies any headaches or thrombosis episodes.  His performance status activity level is unchanged.  He has lost few pounds.  He does not report any headaches, blurry vision, syncope or seizures.  He denies any lethargy or dizziness.  He does not report any fevers or chills or sweats.  He is not reporting any chest pain shortness of breath cough or hemoptysis. He does not report any chest pain, palpitation or dyspnea exertion.  He denies any nausea, vomiting or early satiety.  Denies any changes in bowel habits.  He does not report any frequency urgency or hesitancy. He does not report any ecchymosis or petechiae.  No report any bone pain or pathological fractures.  He does not report any mood changes.  Rest of his review of systems is negative.  Medications: I have reviewed the patient's current medications.  Current Outpatient Medications  Medication Sig Dispense Refill  . aspirin 81 MG tablet Take 81 mg by mouth daily.    . famotidine (PEPCID) 20 MG tablet Take 20 mg by mouth daily.    Marland Kitchen losartan-hydrochlorothiazide (HYZAAR) 100-25 MG per tablet Take 1 tablet by mouth  daily.    . meloxicam (MOBIC) 15 MG tablet Take 15 mg by mouth as needed.     . pravastatin (PRAVACHOL) 40 MG tablet Take 40 mg by mouth daily.     No current facility-administered medications for this visit.      Allergies:  Allergies  Allergen Reactions  . Oxycodone-Acetaminophen Other (See Comments)    Patient stated,"it keeps me wide awake and I can't sleep."    Past Medical History, Surgical history, Social history, and Family History remain unchanged and updated today.  Physical exam: Blood pressure 138/82, pulse 74, temperature 98.2 F (36.8 C), temperature source Oral, resp. rate 17, height 5\' 9"  (1.753 m), weight 178 lb 4.8 oz (80.9 kg), SpO2 97 %.   ECOG: 0   General appearance: Alert, awake without any distress. Head: Atraumatic without abnormalities Oropharynx: Without any thrush or ulcers. Eyes: No scleral icterus. Lymph nodes: No lymphadenopathy noted in the cervical, supraclavicular, or axillary nodes Heart:regular rate and rhythm, without any murmurs or gallops.   Lung: Clear to auscultation without any rhonchi, wheezes or dullness to percussion. Abdomin: Soft, nontender without any shifting dullness or ascites. Musculoskeletal: No clubbing or cyanosis. Neurological: No motor or sensory deficits. Skin: No rashes or lesions.   Lab Results: Lab Results  Component Value Date   WBC 6.7 04/08/2018   HGB 16.6 04/08/2018   HCT 56.5 (H) 04/08/2018   MCV 70.0 (L) 04/08/2018   PLT 395 04/08/2018     Chemistry      Component Value Date/Time   NA 141  01/18/2015 0835   K 4.1 01/18/2015 0835   CL 103 05/20/2010 1035   CO2 25 01/18/2015 0835   BUN 21.5 01/18/2015 0835   CREATININE 1.1 01/18/2015 0835      Component Value Date/Time   CALCIUM 9.8 01/18/2015 0835   ALKPHOS 66 01/18/2015 0835   AST 15 01/18/2015 0835   ALT 16 01/18/2015 0835   BILITOT 0.91 01/18/2015 0835         Impression and Plan:  73 year old gentleman with the following  issues:  1.  Secondary polycythemia: The differential diagnosis was reviewed again which includes undiagnosed myeloproliferative disorder versus reactive process.  Risks and benefits of continuing phlebotomy intermittently was reviewed today and he is agreeable to continue.  The plan is to do it every 6 months to keep his hematocrit below 45.  His hemoglobin   2. Thrombosis prophylaxis: No recent thrombosis noted.  He remains on aspirin.  3.  Sinus congestion: He is following with his primary care physician regarding this issue.  4. Followup: Will be in 6 months for repeat laboratory testing.  15 minutes was spent today face-to-face with the patient.  More than 50% of that time was spent in coordination, education and reviewing his natural course of disease and treatment options.  [name] Button, MD 1/24/20209:46 AM Hematology and Oncology Follow Up Visit  [name] [id number] 27-Aug-1945 73 y.o. 04/08/2018 9:46 AM [name], MDKim, [name] Rinks, MD   Principle Diagnosis: 73 year old man with polycythemia  2011.  He is Jak 2 mutation is negative and the etiology is related to secondary causes versus polycythemia vera.   Prior Therapy: Status post therapeutic phlebotomy in the past on an intermittent basis.  Current therapy: Phlebotomy every 3 months to keep his hematocrit less than 45.  Interim History:  [name] is here for a follow-up visit.  Since the last visit, he reports no major changes in his health.  He continues to receive phlebotomy intermittently every 3 months without complications.  He denies any changes with or without the phlebotomy.  He does report some sinus congestion which causes headache periodically but no visual changes, dizziness or falls.  Continues to be active and attends to activities of daily living.  He tolerates a phlebotomy well although his IV access has been an issue at times.  He does not report any headaches, blurry vision, syncope or seizures.  He  denies any alteration of mental status or confusion.  He does not report any fevers or chills or sweats.  He is not reporting any chest pain shortness of breath cough or hemoptysis. He does not report any dyspnea on exertion or angina. Is not report any nausea or vomiting.  Denies any early satiety or distention.  He does not report any frequency urgency or hesitancy. He does not report any bleeding or clotting tendencies.  No report any arthralgias or myalgias.  He does not report any petechiae or rash.  Rest of his review of systems is negative.  Medications: I have reviewed the patient's current medications.  Current Outpatient Medications  Medication Sig Dispense Refill  . aspirin 81 MG tablet Take 81 mg by mouth daily.    . famotidine (PEPCID) 20 MG tablet Take 20 mg by mouth daily.    Marland Kitchen losartan-hydrochlorothiazide (HYZAAR) 100-25 MG per tablet Take 1 tablet by mouth daily.    . meloxicam (MOBIC) 15 MG tablet Take 15 mg by mouth as needed.     . pravastatin (PRAVACHOL) 40 MG tablet  Take 40 mg by mouth daily.     No current facility-administered medications for this visit.      Allergies:  Allergies  Allergen Reactions  . Oxycodone-Acetaminophen Other (See Comments)    Patient stated,"it keeps me wide awake and I can't sleep."    Past Medical History, Surgical history, Social history, and Family History remain unchanged and updated today.  Physical exam: Blood pressure 138/82, pulse 74, temperature 98.2 F (36.8 C), temperature source Oral, resp. rate 17, height 5\' 9"  (1.753 m), weight 178 lb 4.8 oz (80.9 kg), SpO2 97 %.   ECOG: 0 General appearance: Well-appearing gentleman without distress. Head: Atraumatic without abnormalities. Oropharynx: Mucous membranes are moist and pink. Eyes: Pupils are equal and round reactive to light. Lymph nodes: No lymphadenopathy noted in the cervical, supraclavicular, or axillary nodes. Heart:regular rate and rhythm, S1, S2.  No murmurs or  gallops. Lung: Clear without any rhonchi, wheezes or dullness to percussion. Abdomin: Soft, nontender without any rebound or guarding.  No shifting dullness or ascites. Musculoskeletal: No clubbing or cyanosis. Skin: No skin rashes or lesions. Neurological: No motor or sensory deficits.  Lab Results: Lab Results  Component Value Date   WBC 6.7 04/08/2018   HGB 16.6 04/08/2018   HCT 56.5 (H) 04/08/2018   MCV 70.0 (L) 04/08/2018   PLT 395 04/08/2018     Chemistry      Component Value Date/Time   NA 141 01/18/2015 0835   K 4.1 01/18/2015 0835   CL 103 05/20/2010 1035   CO2 25 01/18/2015 0835   BUN 21.5 01/18/2015 0835   CREATININE 1.1 01/18/2015 0835      Component Value Date/Time   CALCIUM 9.8 01/18/2015 0835   ALKPHOS 66 01/18/2015 0835   AST 15 01/18/2015 0835   ALT 16 01/18/2015 0835   BILITOT 0.91 01/18/2015 0835         Impression and Plan:  73 year old man with:  1. Polycythemia: The differential diagnosis was reviewed again which include myeloproliferative disorder versus reactive process.  Treatment options were reviewed at this time which include continuing phlebotomy intermittently versus observation and surveillance.  At this time we have elected to continue with phlebotomy to control his hemoglobin and hematocrit below 45.  He has no objections to continuing at this time.   2. Thrombosis prophylaxis: I recommended continuing aspirin at this time.  3.  GI symptoms: His symptoms are consistent to mostly dyspepsia and belching.  I have recommended evaluation by gastroenterology and possible endoscopy  4. Followup: Will be in 6 months for MD evaluation possible phlebotomy.  15 minutes was spent today face-to-face with the patient.  More than 50% of that time was dedicated to reviewing his disease status, treatment options and coordinating plan of care.  [name] Button, MD 1/24/20209:46 AM

## 2018-04-12 DIAGNOSIS — R0789 Other chest pain: Secondary | ICD-10-CM | POA: Diagnosis not present

## 2018-04-12 DIAGNOSIS — R05 Cough: Secondary | ICD-10-CM | POA: Diagnosis not present

## 2018-04-14 ENCOUNTER — Other Ambulatory Visit: Payer: Self-pay | Admitting: Family Medicine

## 2018-04-14 DIAGNOSIS — R059 Cough, unspecified: Secondary | ICD-10-CM

## 2018-04-14 DIAGNOSIS — R0789 Other chest pain: Secondary | ICD-10-CM

## 2018-04-14 DIAGNOSIS — R05 Cough: Secondary | ICD-10-CM

## 2018-04-18 ENCOUNTER — Ambulatory Visit
Admission: RE | Admit: 2018-04-18 | Discharge: 2018-04-18 | Disposition: A | Discharge status: Home / Self Care | Payer: Medicare Other | Source: Ambulatory Visit | Attending: Family Medicine | Admitting: Family Medicine

## 2018-04-18 DIAGNOSIS — R059 Cough, unspecified: Secondary | ICD-10-CM

## 2018-04-18 DIAGNOSIS — R05 Cough: Secondary | ICD-10-CM

## 2018-04-18 DIAGNOSIS — R0789 Other chest pain: Secondary | ICD-10-CM

## 2018-04-18 DIAGNOSIS — J439 Emphysema, unspecified: Secondary | ICD-10-CM | POA: Diagnosis not present

## 2018-04-26 ENCOUNTER — Other Ambulatory Visit: Payer: Self-pay | Admitting: Family Medicine

## 2018-04-26 DIAGNOSIS — R9389 Abnormal findings on diagnostic imaging of other specified body structures: Secondary | ICD-10-CM

## 2018-04-26 DIAGNOSIS — F411 Generalized anxiety disorder: Secondary | ICD-10-CM | POA: Diagnosis not present

## 2018-04-26 DIAGNOSIS — R935 Abnormal findings on diagnostic imaging of other abdominal regions, including retroperitoneum: Secondary | ICD-10-CM | POA: Diagnosis not present

## 2018-04-26 DIAGNOSIS — R1013 Epigastric pain: Secondary | ICD-10-CM | POA: Diagnosis not present

## 2018-04-26 DIAGNOSIS — J309 Allergic rhinitis, unspecified: Secondary | ICD-10-CM | POA: Diagnosis not present

## 2018-04-29 ENCOUNTER — Other Ambulatory Visit: Payer: Self-pay | Admitting: Family Medicine

## 2018-05-01 ENCOUNTER — Ambulatory Visit
Admission: RE | Admit: 2018-05-01 | Discharge: 2018-05-01 | Disposition: A | Discharge status: Home / Self Care | Payer: Medicare Other | Source: Ambulatory Visit | Attending: Family Medicine | Admitting: Family Medicine

## 2018-05-01 DIAGNOSIS — R9389 Abnormal findings on diagnostic imaging of other specified body structures: Secondary | ICD-10-CM

## 2018-05-01 DIAGNOSIS — N2889 Other specified disorders of kidney and ureter: Secondary | ICD-10-CM | POA: Diagnosis not present

## 2018-05-01 MED ORDER — GADOBENATE DIMEGLUMINE 529 MG/ML IV SOLN
15.0000 mL | Freq: Once | INTRAVENOUS | Status: AC | PRN
  Administered 2018-05-01: 15 mL via INTRAVENOUS

## 2018-05-09 ENCOUNTER — Other Ambulatory Visit: Payer: Self-pay | Admitting: Gastroenterology

## 2018-05-09 DIAGNOSIS — R634 Abnormal weight loss: Secondary | ICD-10-CM | POA: Diagnosis not present

## 2018-05-09 DIAGNOSIS — R140 Abdominal distension (gaseous): Secondary | ICD-10-CM | POA: Diagnosis not present

## 2018-05-09 DIAGNOSIS — N2889 Other specified disorders of kidney and ureter: Secondary | ICD-10-CM | POA: Diagnosis not present

## 2018-05-09 DIAGNOSIS — D751 Secondary polycythemia: Secondary | ICD-10-CM | POA: Diagnosis not present

## 2018-05-12 ENCOUNTER — Encounter (HOSPITAL_COMMUNITY): Payer: Self-pay | Admitting: *Deleted

## 2018-05-12 ENCOUNTER — Other Ambulatory Visit: Payer: Self-pay

## 2018-05-12 ENCOUNTER — Encounter (HOSPITAL_COMMUNITY)
Admission: RE | Disposition: A | Discharge status: Home / Self Care | Payer: Self-pay | Source: Home / Self Care | Attending: Gastroenterology

## 2018-05-12 ENCOUNTER — Ambulatory Visit (HOSPITAL_COMMUNITY): Payer: Medicare Other | Admitting: Anesthesiology

## 2018-05-12 ENCOUNTER — Ambulatory Visit (HOSPITAL_COMMUNITY)
Admission: RE | Admit: 2018-05-12 | Discharge: 2018-05-12 | Disposition: A | Discharge status: Home / Self Care | Payer: Medicare Other | Attending: Gastroenterology | Admitting: Gastroenterology

## 2018-05-12 DIAGNOSIS — C641 Malignant neoplasm of right kidney, except renal pelvis: Secondary | ICD-10-CM | POA: Diagnosis not present

## 2018-05-12 DIAGNOSIS — R112 Nausea with vomiting, unspecified: Secondary | ICD-10-CM | POA: Diagnosis not present

## 2018-05-12 DIAGNOSIS — Z79899 Other long term (current) drug therapy: Secondary | ICD-10-CM | POA: Insufficient documentation

## 2018-05-12 DIAGNOSIS — Z8546 Personal history of malignant neoplasm of prostate: Secondary | ICD-10-CM | POA: Insufficient documentation

## 2018-05-12 DIAGNOSIS — C61 Malignant neoplasm of prostate: Secondary | ICD-10-CM | POA: Diagnosis not present

## 2018-05-12 DIAGNOSIS — I10 Essential (primary) hypertension: Secondary | ICD-10-CM | POA: Diagnosis not present

## 2018-05-12 DIAGNOSIS — R140 Abdominal distension (gaseous): Secondary | ICD-10-CM | POA: Diagnosis not present

## 2018-05-12 DIAGNOSIS — Z7982 Long term (current) use of aspirin: Secondary | ICD-10-CM | POA: Diagnosis not present

## 2018-05-12 DIAGNOSIS — K449 Diaphragmatic hernia without obstruction or gangrene: Secondary | ICD-10-CM | POA: Diagnosis not present

## 2018-05-12 DIAGNOSIS — R110 Nausea: Secondary | ICD-10-CM | POA: Diagnosis not present

## 2018-05-12 DIAGNOSIS — Z87891 Personal history of nicotine dependence: Secondary | ICD-10-CM | POA: Diagnosis not present

## 2018-05-12 DIAGNOSIS — Z885 Allergy status to narcotic agent status: Secondary | ICD-10-CM | POA: Diagnosis not present

## 2018-05-12 DIAGNOSIS — R1013 Epigastric pain: Secondary | ICD-10-CM | POA: Diagnosis not present

## 2018-05-12 HISTORY — PX: ESOPHAGOGASTRODUODENOSCOPY (EGD) WITH PROPOFOL: SHX5813

## 2018-05-12 SURGERY — ESOPHAGOGASTRODUODENOSCOPY (EGD) WITH PROPOFOL
Anesthesia: Monitor Anesthesia Care

## 2018-05-12 MED ORDER — PROPOFOL 10 MG/ML IV BOLUS
INTRAVENOUS | Status: AC
  Filled 2018-05-12: qty 20

## 2018-05-12 MED ORDER — LIDOCAINE 2% (20 MG/ML) 5 ML SYRINGE
INTRAMUSCULAR | Status: DC | PRN
  Administered 2018-05-12: 40 mg via INTRAVENOUS

## 2018-05-12 MED ORDER — LACTATED RINGERS IV SOLN
INTRAVENOUS | Status: DC
  Administered 2018-05-12: 1000 mL via INTRAVENOUS

## 2018-05-12 MED ORDER — PROPOFOL 10 MG/ML IV BOLUS
INTRAVENOUS | Status: DC | PRN
  Administered 2018-05-12: 30 mg via INTRAVENOUS
  Administered 2018-05-12: 20 mg via INTRAVENOUS
  Administered 2018-05-12: 30 mg via INTRAVENOUS
  Administered 2018-05-12 (×2): 20 mg via INTRAVENOUS

## 2018-05-12 SURGICAL SUPPLY — 15 items

## 2018-05-12 NOTE — Transfer of Care (Signed)
Immediate Anesthesia Transfer of Care Note  Patient: [name]  Procedure(s) Performed: ESOPHAGOGASTRODUODENOSCOPY (EGD) WITH PROPOFOL (N/A )  Patient Location: PACU  Anesthesia Type:MAC  Level of Consciousness: sedated  Airway & Oxygen Therapy: Patient Spontanous Breathing and Patient connected to nasal cannula oxygen  Post-op Assessment: Report given to RN and Post -op Vital signs reviewed and stable  Post vital signs: Reviewed and stable  Last Vitals:  Vitals Value Taken Time  BP    Temp    Pulse 76 05/12/2018  3:04 PM  Resp 20 05/12/2018  3:04 PM  SpO2 98 % 05/12/2018  3:04 PM  Vitals shown include unvalidated device data.  Last Pain:  Vitals:   05/12/18 1351  TempSrc: Oral  PainSc: 0-No pain         Complications: No apparent anesthesia complications

## 2018-05-12 NOTE — Anesthesia Postprocedure Evaluation (Signed)
Anesthesia Post Note  Patient: [name]  Procedure(s) Performed: ESOPHAGOGASTRODUODENOSCOPY (EGD) WITH PROPOFOL (N/A )     Patient location during evaluation: PACU Anesthesia Type: MAC Level of consciousness: awake and alert Pain management: pain level controlled Vital Signs Assessment: post-procedure vital signs reviewed and stable Respiratory status: spontaneous breathing, nonlabored ventilation, respiratory function stable and patient connected to nasal cannula oxygen Cardiovascular status: stable and blood pressure returned to baseline Postop Assessment: no apparent nausea or vomiting Anesthetic complications: no    Last Vitals:  Vitals:   05/12/18 1520 05/12/18 1530  BP: 126/69 129/79  Pulse: 77 74  Resp: (!) 28 19  Temp:    SpO2: 96% 96%    Last Pain:  Vitals:   05/12/18 1530  TempSrc:   PainSc: 0-No pain                 Tiajuana Amass

## 2018-05-12 NOTE — Anesthesia Preprocedure Evaluation (Signed)
Anesthesia Evaluation  Patient identified by MRN, date of birth, ID band Patient awake    Reviewed: Allergy & Precautions, NPO status , Patient's Chart, lab work & pertinent test results  Airway Mallampati: II  TM Distance: >3 FB Neck ROM: Full    Dental   Pulmonary former smoker,    breath sounds clear to auscultation       Cardiovascular hypertension, Pt. on medications  Rhythm:Regular Rate:Normal     Neuro/Psych negative neurological ROS     GI/Hepatic Neg liver ROS, GERD  ,  Endo/Other  negative endocrine ROS  Renal/GU negative Renal ROS     Musculoskeletal   Abdominal   Peds  Hematology negative hematology ROS (+)   Anesthesia Other Findings   Reproductive/Obstetrics                             Anesthesia Physical Anesthesia Plan  ASA: II  Anesthesia Plan: MAC   Post-op Pain Management:    Induction: Intravenous  PONV Risk Score and Plan: 1 and Propofol infusion and Treatment may vary due to age or medical condition  Airway Management Planned: Natural Airway and Nasal Cannula  Additional Equipment:   Intra-op Plan:   Post-operative Plan:   Informed Consent: I have reviewed the patients History and Physical, chart, labs and discussed the procedure including the risks, benefits and alternatives for the proposed anesthesia with the patient or authorized representative who has indicated his/her understanding and acceptance.       Plan Discussed with:   Anesthesia Plan Comments:         Anesthesia Quick Evaluation

## 2018-05-12 NOTE — Anesthesia Procedure Notes (Signed)
Date/Time: 05/12/2018 2:47 PM Performed by: Talbot Grumbling, CRNA Oxygen Delivery Method: Nasal cannula

## 2018-05-12 NOTE — Op Note (Signed)
[facility] Patient Name: [name] Procedure Date: 05/12/2018 MRN: [id number] Attending MD: Nancy Fetter Dr., MD Date of Birth: [birth year] CSN: [id number] Age: 73 Admit Type: Outpatient Procedure:                Upper GI endoscopy Indications:              Epigastric abdominal pain, Abdominal bloating,                            Nausea, patient has a large renal mass probably                            cancer and will be having surgery soon. Providers:                Joyice Faster. Kadie Balestrieri Dr., MD, Cleda Daub, RN, Cletis Athens, Technician, Marla Roe, CRNA Referring MD:             Janie Morning, DO Medicines:                Monitored Anesthesia Care Complications:            No immediate complications. Estimated Blood Loss:     Estimated blood loss: none. Procedure:                Pre-Anesthesia Assessment:                           - Prior to the procedure, a History and Physical                            was performed, and patient medications and                            allergies were reviewed. The patient's tolerance of                            previous anesthesia was also reviewed. The risks                            and benefits of the procedure and the sedation                            options and risks were discussed with the patient.                            All questions were answered, and informed consent                            was obtained. Prior Anticoagulants: The patient has                            taken aspirin, last dose was 1 day prior to  procedure. ASA Grade Assessment: II - A patient                            with mild systemic disease. After reviewing the                            risks and benefits, the patient was deemed in                            satisfactory condition to undergo the procedure.                           - Prior to the procedure, a History and  Physical                            was performed, and patient medications and                            allergies were reviewed. The patient's tolerance of                            previous anesthesia was also reviewed. The risks                            and benefits of the procedure and the sedation                            options and risks were discussed with the patient.                            All questions were answered, and informed consent                            was obtained. Prior Anticoagulants: The patient has                            taken aspirin, last dose was 1 day prior to                            procedure. ASA Grade Assessment: II - A patient                            with mild systemic disease. After reviewing the                            risks and benefits, the patient was deemed in                            satisfactory condition to undergo the procedure.                           After obtaining informed consent, the endoscope was  passed under direct vision. Throughout the                            procedure, the patient's blood pressure, pulse, and                            oxygen saturations were monitored continuously. The                            GIF-H190 ([id number]) Olympus gastroscope was                            introduced through the mouth, and advanced to the                            third part of duodenum. The upper GI endoscopy was                            accomplished without difficulty. The patient                            tolerated the procedure well. Scope In: Scope Out: Findings:      A small hiatal hernia was present.      The stomach was normal.      The examined duodenum was normal. We were able to advance down to the       full extent of the scope what was estimated to be the third duodenum. No       compression, extensive liquid, ulcerations etc. Impression:               - Small  hiatal hernia.                           - Normal stomach.                           - Normal examined duodenum.                           - No specimens collected.                           - Nausea without cause found on EGD Moderate Sedation:      See anesthesia note, no moderate sedation. Recommendation:           - Patient has a contact number available for                            emergencies. The signs and symptoms of potential                            delayed complications were discussed with the                            patient. Return to normal activities tomorrow.  Written discharge instructions were provided to the                            patient.                           - Resume previous diet.                           - Continue present medications.                           - No repeat upper endoscopy. Procedure Code(s):        --- Professional ---                           [phone], Esophagogastroduodenoscopy, flexible,                            transoral; diagnostic, including collection of                            specimen(s) by brushing or washing, when performed                            (separate procedure) Diagnosis Code(s):        --- Professional ---                           R11.0, Nausea                           R14.0, Abdominal distension (gaseous)                           R10.13, Epigastric pain                           K44.9, Diaphragmatic hernia without obstruction or                            gangrene CPT copyright 2018 [employer]. All rights reserved. The codes documented in this report are preliminary and upon coder review may  be revised to meet current compliance requirements. Nancy Fetter Dr., MD 05/12/2018 3:13:38 PM This report has been signed electronically. Number of Addenda: 0

## 2018-05-12 NOTE — H&P (Signed)
Subjective:   Patient is a 73 y.o. male presents with bloating nausea and vomiting.  Has a large renal mass on CT will be seeing Dr. Tresa Moore will have nephrectomy.  This is felt to be a malignant mass.  He has been taking some ibuprofen.  Question does he have gastric outlet obstruction due to renal mass versus ulcer.  Were going to go ahead with EGD to try to make sure nothing else contributing.. Procedure including risks and benefits discussed in office.  Patient Active Problem List   Diagnosis Date Noted  . 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 Diverticulitis   . Hypertension   . Inguinal hernia   . Polycythemia    phlebotomy 06/15/13  . Prostate cancer ([place])   . Wears glasses   . Wears hearing aid    both ears    Past Surgical History:  Procedure Laterality Date  . COLONOSCOPY    . 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 HERNIA REPAIR  2010   lt ing with lt hydrocele  . RADIOACTIVE SEED IMPLANT  2006   hx of prostate  . SHOULDER ARTHROSCOPY  11/13   right    Medications Prior to Admission  Medication Sig Dispense Refill Last Dose  . aspirin 81 MG tablet Take 81 mg by mouth daily.   05/11/2018 at Unknown time  . famotidine (PEPCID) 20 MG tablet Take 20 mg by mouth daily.   05/11/2018 at Unknown time  . losartan-hydrochlorothiazide (HYZAAR) 100-25 MG per tablet Take 1 tablet by mouth daily.   05/12/2018 at 0500  . meloxicam (MOBIC) 15 MG tablet Take 15 mg by mouth daily as needed for pain.    Past Month at Unknown time  . pravastatin (PRAVACHOL) 40 MG tablet Take 40 mg by mouth daily.   05/11/2018 at Unknown time   Allergies  Allergen Reactions  . Oxycodone-Acetaminophen Other (See Comments)    Patient stated,"it keeps me wide awake and I can't sleep."     Social History   Tobacco Use  . Smoking status: Former Smoker    Last attempt to quit: 06/16/1970    Years since quitting: 47.9  Substance Use Topics  . Alcohol use: Yes    Comment: occ wine    Family History  Problem Relation Age of Onset  . Heart attack Father   . Hypertension Father      Objective:   Patient Vitals for the past 8 hrs:  BP Temp Temp src Pulse Resp SpO2 Height Weight  05/12/18 1351 128/83 98.7 F (37.1 C) Oral 84 (!) 23 96 % 5\' 9"  (1.753 m) 77.1 kg   No intake/output data recorded. No intake/output data recorded.   See MD Preop evaluation      Assessment:   1.  Bloating 2.  Postprandial nausea 3.  Right renal cancer  Plan:   We will proceed with EGD to make sure he does not have ulcer or invasive cancer in the duodenum etc.  Have discussed with patient and wife.

## 2018-05-12 NOTE — Discharge Instructions (Signed)

## 2018-05-13 ENCOUNTER — Encounter (HOSPITAL_COMMUNITY): Payer: Self-pay | Admitting: Gastroenterology

## 2018-05-16 ENCOUNTER — Other Ambulatory Visit: Payer: Self-pay | Admitting: Urology

## 2018-06-10 NOTE — Progress Notes (Signed)
Called patient prior to preop appt on 06/13/2018 regarding COVID 19 questions.  Patient denies any cough, fever, runny nose or sore throat. Patient denies any difficulty breathing or shortness of breath.  Patient has not traveled outside of the state in last 14 days.

## 2018-06-10 NOTE — Patient Instructions (Signed)
CASHMERE HARMES  06/10/2018   Your procedure is scheduled on: 06/22/2018   Report to [facility] Main  Entrance  Report to admitting at   [place] AM    Call this number if you have problems the morning of surgery [phone]   Remember: Do not eat food or drink liquids :After Midnight. BRUSH YOUR TEETH MORNING OF SURGERY AND RINSE YOUR MOUTH OUT, NO CHEWING GUM CANDY OR MINTS.              Clear liquid diet the day before surgery.               Drink one bottle of Magnesium Citrate by 12 noon day before surgery      Take these medicines the morning of surgery with A SIP OF WATER: pepcid, flonase                                You may not have any metal on your body including hair pins and              piercings  Do not wear jewelry,  lotions, powders or perfumes, deodorant                      Men may shave face and neck.   Do not bring valuables to the hospital. [place].  Contacts, dentures or bridgework may not be worn into surgery.  Leave suitcase in the car. After surgery it may be brought to your room.                    Please read over the following fact sheets you were given: _____________________________________________________________________                CLEAR LIQUID DIET   Foods Allowed                                                                     Foods Excluded  Coffee and tea, regular and decaf                             liquids that you cannot  Plain Jell-O in any flavor                                             see through such as: Fruit ices (not with fruit pulp)                                     milk, soups, orange juice  Iced Popsicles  All solid food Carbonated beverages, regular and diet                                    Cranberry, grape and apple juices Sports drinks like Gatorade Lightly seasoned clear broth or consume(fat  free) Sugar, honey syrup  Sample Menu Breakfast                                Lunch                                     Supper Cranberry juice                    Beef broth                            Chicken broth Jell-O                                     Grape juice                           Apple juice Coffee or tea                        Jell-O                                      Popsicle                                                Coffee or tea                        Coffee or tea  _____________________________________________________________________  [facility] - Preparing for Surgery Before surgery, you can play an important role.  Because skin is not sterile, your skin needs to be as free of germs as possible.  You can reduce the number of germs on your skin by washing with CHG (chlorahexidine gluconate) soap before surgery.  CHG is an antiseptic cleaner which kills germs and bonds with the skin to continue killing germs even after washing. Please DO NOT use if you have an allergy to CHG or antibacterial soaps.  If your skin becomes reddened/irritated stop using the CHG and inform your nurse when you arrive at Short Stay. Do not shave (including legs and underarms) for at least 48 hours prior to the first CHG shower.  You may shave your face/neck. Please follow these instructions carefully:  1.  Shower with CHG Soap the night before surgery and the  morning of Surgery.  2.  If you choose to wash your hair, wash your hair first as usual with your  normal  shampoo.  3.  After you shampoo, rinse your hair and body thoroughly to remove the  shampoo.  4.  Use CHG as you would any other liquid soap.  You can apply chg directly  to the skin and wash                       Gently with a scrungie or clean washcloth.  5.  Apply the CHG Soap to your body ONLY FROM THE NECK DOWN.   Do not use on face/ open                           Wound or open sores. Avoid contact  with eyes, ears mouth and genitals (private parts).                       Wash face,  Genitals (private parts) with your normal soap.             6.  Wash thoroughly, paying special attention to the area where your surgery  will be performed.  7.  Thoroughly rinse your body with warm water from the neck down.  8.  DO NOT shower/wash with your normal soap after using and rinsing off  the CHG Soap.                9.  Pat yourself dry with a clean towel.            10.  Wear clean pajamas.            11.  Place clean sheets on your bed the night of your first shower and do not  sleep with pets. Day of Surgery : Do not apply any lotions/deodorants the morning of surgery.  Please wear clean clothes to the hospital/surgery center.  FAILURE TO FOLLOW THESE INSTRUCTIONS MAY RESULT IN THE CANCELLATION OF YOUR SURGERY PATIENT SIGNATURE_________________________________  NURSE SIGNATURE__________________________________  ________________________________________________________________________

## 2018-06-13 ENCOUNTER — Encounter (HOSPITAL_COMMUNITY)
Admission: RE | Admit: 2018-06-13 | Discharge: 2018-06-13 | Disposition: A | Discharge status: Home / Self Care | Payer: Medicare Other | Source: Ambulatory Visit | Attending: Urology | Admitting: Urology

## 2018-06-13 ENCOUNTER — Other Ambulatory Visit: Payer: Self-pay

## 2018-06-13 ENCOUNTER — Other Ambulatory Visit (HOSPITAL_COMMUNITY): Payer: Medicare Other

## 2018-06-13 ENCOUNTER — Encounter (HOSPITAL_COMMUNITY): Payer: Self-pay

## 2018-06-13 DIAGNOSIS — C641 Malignant neoplasm of right kidney, except renal pelvis: Secondary | ICD-10-CM | POA: Diagnosis not present

## 2018-06-13 DIAGNOSIS — Z01818 Encounter for other preprocedural examination: Secondary | ICD-10-CM | POA: Insufficient documentation

## 2018-06-13 DIAGNOSIS — R8271 Bacteriuria: Secondary | ICD-10-CM | POA: Diagnosis not present

## 2018-06-13 HISTORY — DX: Unspecified osteoarthritis, unspecified site: M19.90

## 2018-06-13 HISTORY — DX: Anxiety disorder, unspecified: F41.9

## 2018-06-13 LAB — BASIC METABOLIC PANEL
Anion gap: 11 (ref 5–15)
BUN: 14 mg/dL (ref 8–23)
CO2: 24 mmol/L (ref 22–32)
Calcium: 9.5 mg/dL (ref 8.9–10.3)
Chloride: 101 mmol/L (ref 98–111)
Creatinine, Ser: 1 mg/dL (ref 0.61–1.24)
GFR calc non Af Amer: >60 mL/min (ref >60)
Glucose, Bld: 113 mg/dL — ABNORMAL HIGH (ref 70–99)
Potassium: 3.9 mmol/L (ref 3.5–5.1)
Sodium: 136 mmol/L (ref 135–145)

## 2018-06-13 LAB — CBC
HEMATOCRIT: 55.1 % — AB (ref 39.0–52.0)
Hemoglobin: 15.6 g/dL (ref 13.0–17.0)
MCH: 20.1 pg — ABNORMAL LOW (ref 26.0–34.0)
MCHC: 28.3 g/dL — ABNORMAL LOW (ref 30.0–36.0)
MCV: 70.9 fL — ABNORMAL LOW (ref 80.0–100.0)
Platelets: 408 10*3/uL — ABNORMAL HIGH (ref 150–400)
RBC: 7.77 MIL/uL — ABNORMAL HIGH (ref 4.22–5.81)
RDW: 22.4 % — AB (ref 11.5–15.5)
WBC: 6.4 10*3/uL (ref 4.0–10.5)
nRBC: 0 % (ref 0.0–0.2)

## 2018-06-13 LAB — ABO/RH: ABO/RH(D): O POS

## 2018-06-13 NOTE — Progress Notes (Signed)
Chart for Anesthesia to review final EKG- 06/13/18-RBBB is new.

## 2018-06-13 NOTE — Progress Notes (Signed)
AT preop appt patient denies any cough, shortness of breath, fever  Or runny nose,  Or sore throat.   Patient has not traveled out of the state.

## 2018-06-14 NOTE — Anesthesia Preprocedure Evaluation (Addendum)
Anesthesia Evaluation  Patient identified by MRN, date of birth, ID band Patient awake    Reviewed: Allergy & Precautions, NPO status , Patient's Chart, lab work & pertinent test results  History of Anesthesia Complications Negative for: history of anesthetic complications  Airway Mallampati: III  TM Distance: >3 FB Neck ROM: Full    Dental  (+) Dental Advisory Given, Teeth Intact   Pulmonary COPD, former smoker,    breath sounds clear to auscultation       Cardiovascular Exercise Tolerance: Good hypertension, Pt. on medications (-) angina Rhythm:Regular Rate:Normal     Neuro/Psych PSYCHIATRIC DISORDERS Anxiety negative neurological ROS     GI/Hepatic negative GI ROS, Neg liver ROS,   Endo/Other  negative endocrine ROS  Renal/GU  Renal cancer      Musculoskeletal  (+) Arthritis ,   Abdominal   Peds  Hematology  Polycythemia treated with periodic phlebotomy    Anesthesia Other Findings   Reproductive/Obstetrics                          Anesthesia Physical Anesthesia Plan  ASA: III  Anesthesia Plan: General   Post-op Pain Management:    Induction: Intravenous  PONV Risk Score and Plan: 4 or greater and Treatment may vary due to age or medical condition, Ondansetron and Dexamethasone  Airway Management Planned: Oral ETT  Additional Equipment: None  Intra-op Plan:   Post-operative Plan: Extubation in OR  Informed Consent: I have reviewed the patients History and Physical, chart, labs and discussed the procedure including the risks, benefits and alternatives for the proposed anesthesia with the patient or authorized representative who has indicated his/her understanding and acceptance.     Dental advisory given  Plan Discussed with: CRNA and Anesthesiologist  Anesthesia Plan Comments:       Anesthesia Quick Evaluation

## 2018-06-14 NOTE — Progress Notes (Signed)
Anesthesia Chart Review   Case:  [id number] Date/Time:  06/22/18 0800   Procedure:  XI ROBOTIC ASSISTED LAPAROSCOPIC NEPHRECTOMY (Right ) - 3 HRS   Anesthesia type:  General   Pre-op diagnosis:  RIGHT RENAL CANCER   Location:  [place] [facility] / [facility]   Surgeon:  Alexis Frock, MD      DISCUSSION: 73 yo former smoker (quit 06/16/70) with h/o polycythemia, HTN, anxiety, prostate cancer, right renal cancer scheduled for above procedure 06/22/18 with Dr. Alexis Frock.   Pt seen by oncologist, Dr. Zola Button, 04/08/18.  Pt with secondary polycythemia, undergoes therapeutic phlebotomy every 6 months.   Pt can proceed with planned procedure barring acute status change.  VS: BP 128/76   Pulse 73   Temp 36.6 C (Oral)   Resp 16   Ht 5\' 8"  (1.727 m)   Wt 77.8 kg   SpO2 98%   BMI 26.09 kg/m   PROVIDERS: Jani Gravel, MD is PCP   Zola Button, MD is Oncologist  LABS: Labs reviewed: Acceptable for surgery. (all labs ordered are listed, but only abnormal results are displayed)  Labs Reviewed  BASIC METABOLIC PANEL - Abnormal; Notable for the following components:      Result Value   Glucose, Bld 113 (*)    All other components within normal limits  CBC - Abnormal; Notable for the following components:   RBC 7.77 (*)    HCT 55.1 (*)    MCV 70.9 (*)    MCH 20.1 (*)    MCHC 28.3 (*)    RDW 22.4 (*)    Platelets 408 (*)    All other components within normal limits  TYPE AND SCREEN  ABO/RH     IMAGES:   EKG: 06/13/18 Rate 72 bpm Sinus rhythm with 1st degree AV block  Right bundle branch block  Abnormal ECG Since last tracing RBBB is new  CV:  Past Medical History:  Diagnosis Date  . Anxiety   . Arthritis   . Diverticulitis   . Hypertension   . Inguinal hernia   . Polycythemia    phlebotomy 06/15/13  . Prostate cancer ([place])   . Wears glasses   . Wears hearing aid    both ears    Past Surgical History:  Procedure Laterality Date  . COLONOSCOPY    . DUPUYTREN  CONTRACTURE RELEASE Left 06/22/2013   Procedure: EXCISION LEFT DUPUYTRENS RING AND SMALL FINGERS;  Surgeon: Cammie Sickle., MD;  Location: [place];  Service: Orthopedics;  Laterality: Left;  . ESOPHAGOGASTRODUODENOSCOPY (EGD) WITH PROPOFOL N/A 05/12/2018   Procedure: ESOPHAGOGASTRODUODENOSCOPY (EGD) WITH PROPOFOL;  Surgeon: Laurence Spates, MD;  Location: [facility];  Service: Endoscopy;  Laterality: N/A;  . HERNIA REPAIR  2010   lt ing with lt hydrocele  . RADIOACTIVE SEED IMPLANT  2006   hx of prostate  . SHOULDER ARTHROSCOPY  11/13   right    MEDICATIONS: . acetaminophen (TYLENOL) 500 MG tablet  . aspirin 81 MG tablet  . famotidine (PEPCID) 40 MG tablet  . fluticasone (FLONASE) 50 MCG/ACT nasal spray  . hydrochlorothiazide (HYDRODIURIL) 25 MG tablet  . losartan (COZAAR) 100 MG tablet  . meloxicam (MOBIC) 15 MG tablet  . pravastatin (PRAVACHOL) 40 MG tablet   No current facility-administered medications for this encounter.     Maia Plan [facility] Pre-Surgical Testing [phone] 06/14/18 9:42 AM

## 2018-06-22 ENCOUNTER — Encounter (HOSPITAL_COMMUNITY)
Admission: RE | Disposition: A | Discharge status: Home / Self Care | Payer: Self-pay | Source: Home / Self Care | Attending: Urology

## 2018-06-22 ENCOUNTER — Inpatient Hospital Stay (HOSPITAL_COMMUNITY): Payer: Medicare Other | Admitting: Physician Assistant

## 2018-06-22 ENCOUNTER — Other Ambulatory Visit: Payer: Self-pay

## 2018-06-22 ENCOUNTER — Inpatient Hospital Stay (HOSPITAL_COMMUNITY)
Admission: RE | Admit: 2018-06-22 | Discharge: 2018-06-23 | DRG: 658 | Disposition: A | Discharge status: Home / Self Care | Payer: Medicare Other | Attending: Urology | Admitting: Urology

## 2018-06-22 ENCOUNTER — Encounter (HOSPITAL_COMMUNITY): Payer: Self-pay | Admitting: Emergency Medicine

## 2018-06-22 DIAGNOSIS — Z87891 Personal history of nicotine dependence: Secondary | ICD-10-CM | POA: Diagnosis not present

## 2018-06-22 DIAGNOSIS — Z7982 Long term (current) use of aspirin: Secondary | ICD-10-CM | POA: Diagnosis not present

## 2018-06-22 DIAGNOSIS — Z923 Personal history of irradiation: Secondary | ICD-10-CM | POA: Diagnosis not present

## 2018-06-22 DIAGNOSIS — Z7951 Long term (current) use of inhaled steroids: Secondary | ICD-10-CM

## 2018-06-22 DIAGNOSIS — F419 Anxiety disorder, unspecified: Secondary | ICD-10-CM | POA: Diagnosis present

## 2018-06-22 DIAGNOSIS — Z885 Allergy status to narcotic agent status: Secondary | ICD-10-CM | POA: Diagnosis not present

## 2018-06-22 DIAGNOSIS — N2889 Other specified disorders of kidney and ureter: Secondary | ICD-10-CM | POA: Diagnosis present

## 2018-06-22 DIAGNOSIS — C641 Malignant neoplasm of right kidney, except renal pelvis: Principal | ICD-10-CM | POA: Diagnosis present

## 2018-06-22 DIAGNOSIS — Z8546 Personal history of malignant neoplasm of prostate: Secondary | ICD-10-CM

## 2018-06-22 DIAGNOSIS — I10 Essential (primary) hypertension: Secondary | ICD-10-CM | POA: Diagnosis present

## 2018-06-22 DIAGNOSIS — Z974 Presence of external hearing-aid: Secondary | ICD-10-CM | POA: Diagnosis not present

## 2018-06-22 DIAGNOSIS — M1990 Unspecified osteoarthritis, unspecified site: Secondary | ICD-10-CM | POA: Diagnosis present

## 2018-06-22 DIAGNOSIS — J449 Chronic obstructive pulmonary disease, unspecified: Secondary | ICD-10-CM | POA: Diagnosis present

## 2018-06-22 DIAGNOSIS — D751 Secondary polycythemia: Secondary | ICD-10-CM | POA: Diagnosis present

## 2018-06-22 DIAGNOSIS — Z8249 Family history of ischemic heart disease and other diseases of the circulatory system: Secondary | ICD-10-CM | POA: Diagnosis not present

## 2018-06-22 DIAGNOSIS — Z791 Long term (current) use of non-steroidal anti-inflammatories (NSAID): Secondary | ICD-10-CM | POA: Diagnosis not present

## 2018-06-22 HISTORY — PX: ROBOT ASSISTED LAPAROSCOPIC NEPHRECTOMY: SHX5140

## 2018-06-22 LAB — HEMOGLOBIN AND HEMATOCRIT, BLOOD
HCT: 54.5 % — ABNORMAL HIGH (ref 39.0–52.0)
Hemoglobin: 15.2 g/dL (ref 13.0–17.0)

## 2018-06-22 LAB — TYPE AND SCREEN
ABO/RH(D): O POS
Antibody Screen: NEGATIVE

## 2018-06-22 SURGERY — NEPHRECTOMY, RADICAL, ROBOT-ASSISTED, LAPAROSCOPIC, ADULT
Anesthesia: General | Laterality: Right

## 2018-06-22 MED ORDER — PROPOFOL 10 MG/ML IV BOLUS
INTRAVENOUS | Status: AC
  Filled 2018-06-22: qty 20

## 2018-06-22 MED ORDER — DEXAMETHASONE SODIUM PHOSPHATE 10 MG/ML IJ SOLN
INTRAMUSCULAR | Status: AC
  Filled 2018-06-22: qty 1

## 2018-06-22 MED ORDER — FENTANYL CITRATE (PF) 100 MCG/2ML IJ SOLN
INTRAMUSCULAR | Status: AC
  Administered 2018-06-22: 50 ug via INTRAVENOUS
  Filled 2018-06-22: qty 2

## 2018-06-22 MED ORDER — MAGNESIUM CITRATE PO SOLN: 1.0000 | Freq: Once | ORAL | Status: DC

## 2018-06-22 MED ORDER — OXYCODONE HCL 5 MG PO TABS
5.0000 mg | ORAL_TABLET | ORAL | Status: DC | PRN
  Administered 2018-06-23 (×3): 5 mg via ORAL
  Filled 2018-06-22 (×3): qty 1

## 2018-06-22 MED ORDER — FENTANYL CITRATE (PF) 100 MCG/2ML IJ SOLN
INTRAMUSCULAR | Status: AC
  Administered 2018-06-22: 16:00:00
  Filled 2018-06-22: qty 2

## 2018-06-22 MED ORDER — BACITRACIN-NEOMYCIN-POLYMYXIN 400-5-5000 EX OINT: 1.0000 "application " | TOPICAL_OINTMENT | Freq: Three times a day (TID) | CUTANEOUS | Status: DC | PRN

## 2018-06-22 MED ORDER — SUGAMMADEX SODIUM 200 MG/2ML IV SOLN
INTRAVENOUS | Status: DC | PRN
  Administered 2018-06-22: 350 mg via INTRAVENOUS

## 2018-06-22 MED ORDER — BUPIVACAINE LIPOSOME 1.3 % IJ SUSP
20.0000 mL | Freq: Once | INTRAMUSCULAR | Status: AC
  Administered 2018-06-22: 20 mL
  Filled 2018-06-22: qty 20

## 2018-06-22 MED ORDER — PRAVASTATIN SODIUM 40 MG PO TABS
40.0000 mg | ORAL_TABLET | Freq: Every day | ORAL | Status: DC
  Administered 2018-06-22 – 2018-06-23 (×2): 40 mg via ORAL
  Filled 2018-06-22 (×2): qty 1

## 2018-06-22 MED ORDER — SODIUM CHLORIDE (PF) 0.9 % IJ SOLN
INTRAMUSCULAR | Status: AC
  Filled 2018-06-22: qty 20

## 2018-06-22 MED ORDER — BELLADONNA ALKALOIDS-OPIUM 16.2-60 MG RE SUPP: 1.0000 | Freq: Four times a day (QID) | RECTAL | Status: DC | PRN

## 2018-06-22 MED ORDER — FENTANYL CITRATE (PF) 100 MCG/2ML IJ SOLN
INTRAMUSCULAR | Status: AC
  Filled 2018-06-22: qty 2

## 2018-06-22 MED ORDER — ONDANSETRON HCL 4 MG/2ML IJ SOLN
INTRAMUSCULAR | Status: DC | PRN
  Administered 2018-06-22 (×2): 4 mg via INTRAVENOUS

## 2018-06-22 MED ORDER — FENTANYL CITRATE (PF) 250 MCG/5ML IJ SOLN
INTRAMUSCULAR | Status: DC | PRN
  Administered 2018-06-22 (×3): 50 ug via INTRAVENOUS
  Administered 2018-06-22: 100 ug via INTRAVENOUS
  Administered 2018-06-22 (×2): 50 ug via INTRAVENOUS

## 2018-06-22 MED ORDER — LACTATED RINGERS IV SOLN
INTRAVENOUS | Status: DC
  Administered 2018-06-22: 07:00:00 via INTRAVENOUS

## 2018-06-22 MED ORDER — DIPHENHYDRAMINE HCL 50 MG/ML IJ SOLN: 12.5000 mg | Freq: Four times a day (QID) | INTRAMUSCULAR | Status: DC | PRN

## 2018-06-22 MED ORDER — LOSARTAN POTASSIUM 50 MG PO TABS
100.0000 mg | ORAL_TABLET | Freq: Every day | ORAL | Status: DC
  Administered 2018-06-23: 100 mg via ORAL
  Filled 2018-06-22: qty 2

## 2018-06-22 MED ORDER — ROCURONIUM BROMIDE 10 MG/ML (PF) SYRINGE
PREFILLED_SYRINGE | INTRAVENOUS | Status: AC
  Filled 2018-06-22: qty 10

## 2018-06-22 MED ORDER — FENTANYL CITRATE (PF) 100 MCG/2ML IJ SOLN
INTRAMUSCULAR | Status: AC
  Administered 2018-06-22: 15:00:00 50 ug via INTRAVENOUS
  Filled 2018-06-22: qty 2

## 2018-06-22 MED ORDER — ONDANSETRON HCL 4 MG/2ML IJ SOLN: 4.0000 mg | Freq: Once | INTRAMUSCULAR | Status: DC | PRN

## 2018-06-22 MED ORDER — ONDANSETRON HCL 4 MG/2ML IJ SOLN
INTRAMUSCULAR | Status: AC
  Filled 2018-06-22: qty 2

## 2018-06-22 MED ORDER — FENTANYL CITRATE (PF) 100 MCG/2ML IJ SOLN
25.0000 ug | INTRAMUSCULAR | Status: DC | PRN
  Administered 2018-06-22 (×3): 50 ug via INTRAVENOUS

## 2018-06-22 MED ORDER — CEFAZOLIN SODIUM-DEXTROSE 2-4 GM/100ML-% IV SOLN
2.0000 g | INTRAVENOUS | Status: AC
  Administered 2018-06-22: 2 g via INTRAVENOUS
  Filled 2018-06-22: qty 100

## 2018-06-22 MED ORDER — SUGAMMADEX SODIUM 200 MG/2ML IV SOLN
INTRAVENOUS | Status: AC
  Filled 2018-06-22: qty 4

## 2018-06-22 MED ORDER — SODIUM CHLORIDE (PF) 0.9 % IJ SOLN
INTRAMUSCULAR | Status: DC | PRN
  Administered 2018-06-22: 20 mL

## 2018-06-22 MED ORDER — DEXAMETHASONE SODIUM PHOSPHATE 10 MG/ML IJ SOLN
INTRAMUSCULAR | Status: DC | PRN
  Administered 2018-06-22: 10 mg via INTRAVENOUS

## 2018-06-22 MED ORDER — SUCCINYLCHOLINE CHLORIDE 200 MG/10ML IV SOSY
PREFILLED_SYRINGE | INTRAVENOUS | Status: AC
  Filled 2018-06-22: qty 10

## 2018-06-22 MED ORDER — ROCURONIUM BROMIDE 10 MG/ML (PF) SYRINGE
PREFILLED_SYRINGE | INTRAVENOUS | Status: DC | PRN
  Administered 2018-06-22: 10 mg via INTRAVENOUS
  Administered 2018-06-22: 20 mg via INTRAVENOUS
  Administered 2018-06-22: 10 mg via INTRAVENOUS
  Administered 2018-06-22: 50 mg via INTRAVENOUS
  Administered 2018-06-22: 20 mg via INTRAVENOUS
  Administered 2018-06-22: 10 mg via INTRAVENOUS
  Administered 2018-06-22: 30 mg via INTRAVENOUS

## 2018-06-22 MED ORDER — DIPHENHYDRAMINE HCL 12.5 MG/5ML PO ELIX: 12.5000 mg | ORAL_SOLUTION | Freq: Four times a day (QID) | ORAL | Status: DC | PRN

## 2018-06-22 MED ORDER — PROPOFOL 10 MG/ML IV BOLUS
INTRAVENOUS | Status: DC | PRN
  Administered 2018-06-22: 150 mg via INTRAVENOUS

## 2018-06-22 MED ORDER — BUSPIRONE HCL 5 MG PO TABS: 5.0000 mg | ORAL_TABLET | Freq: Two times a day (BID) | ORAL | Status: DC | PRN

## 2018-06-22 MED ORDER — LIDOCAINE 2% (20 MG/ML) 5 ML SYRINGE
INTRAMUSCULAR | Status: AC
  Filled 2018-06-22: qty 5

## 2018-06-22 MED ORDER — FENTANYL CITRATE (PF) 250 MCG/5ML IJ SOLN
INTRAMUSCULAR | Status: AC
  Filled 2018-06-22: qty 5

## 2018-06-22 MED ORDER — DEXTROSE-NACL 5-0.45 % IV SOLN
INTRAVENOUS | Status: DC
  Administered 2018-06-22 – 2018-06-23 (×2): via INTRAVENOUS

## 2018-06-22 MED ORDER — HYDROCODONE-ACETAMINOPHEN 5-325 MG PO TABS: 1.0000 | ORAL_TABLET | Freq: Four times a day (QID) | ORAL | 0 refills | Status: DC | PRN

## 2018-06-22 MED ORDER — DOCUSATE SODIUM 100 MG PO CAPS
100.0000 mg | ORAL_CAPSULE | Freq: Two times a day (BID) | ORAL | Status: DC
  Administered 2018-06-22 – 2018-06-23 (×2): 100 mg via ORAL
  Filled 2018-06-22 (×2): qty 1

## 2018-06-22 MED ORDER — LACTATED RINGERS IV SOLN
INTRAVENOUS | Status: DC
  Administered 2018-06-22 (×3): via INTRAVENOUS

## 2018-06-22 MED ORDER — HYDROMORPHONE HCL 1 MG/ML IJ SOLN
0.5000 mg | INTRAMUSCULAR | Status: DC | PRN
  Administered 2018-06-22 (×3): 1 mg via INTRAVENOUS
  Administered 2018-06-23: 0.5 mg via INTRAVENOUS
  Administered 2018-06-23 (×2): 1 mg via INTRAVENOUS
  Filled 2018-06-22 (×6): qty 1

## 2018-06-22 MED ORDER — LIDOCAINE 2% (20 MG/ML) 5 ML SYRINGE
INTRAMUSCULAR | Status: DC | PRN
  Administered 2018-06-22: 80 mg via INTRAVENOUS

## 2018-06-22 MED ORDER — ACETAMINOPHEN 500 MG PO TABS
1000.0000 mg | ORAL_TABLET | Freq: Four times a day (QID) | ORAL | Status: AC
  Administered 2018-06-22 – 2018-06-23 (×4): 1000 mg via ORAL
  Filled 2018-06-22 (×4): qty 2

## 2018-06-22 MED ORDER — SUCCINYLCHOLINE CHLORIDE 200 MG/10ML IV SOSY
PREFILLED_SYRINGE | INTRAVENOUS | Status: DC | PRN
  Administered 2018-06-22: 120 mg via INTRAVENOUS

## 2018-06-22 MED ORDER — ONDANSETRON HCL 4 MG/2ML IJ SOLN: 4.0000 mg | INTRAMUSCULAR | Status: DC | PRN

## 2018-06-22 MED ORDER — FAMOTIDINE 20 MG PO TABS
40.0000 mg | ORAL_TABLET | Freq: Every day | ORAL | Status: DC
  Administered 2018-06-23: 40 mg via ORAL
  Filled 2018-06-22: qty 2

## 2018-06-22 MED ORDER — SODIUM CHLORIDE 0.9 % IV SOLN
INTRAVENOUS | Status: DC | PRN
  Administered 2018-06-22: 25 ug/min via INTRAVENOUS

## 2018-06-22 SURGICAL SUPPLY — 68 items
ADH SKN CLS APL DERMABOND .7 (GAUZE/BANDAGES/DRESSINGS) ×1
APL PRP STRL LF DISP 70% ISPRP (MISCELLANEOUS) ×1
BAG LAPAROSCOPIC 12 15 PORT 16 (BASKET) ×1 IMPLANT
BAG RETRIEVAL 12/15 (BASKET) ×2
BAG RETRIEVAL 12/15MM (BASKET) ×1
CHLORAPREP W/TINT 26 (MISCELLANEOUS) ×3 IMPLANT
CLIP VESOLOCK LG 6/CT PURPLE (CLIP) ×7 IMPLANT
CLIP VESOLOCK MED LG 6/CT (CLIP) ×3 IMPLANT
CLIP VESOLOCK XL 6/CT (CLIP) ×3 IMPLANT
COVER SURGICAL LIGHT HANDLE (MISCELLANEOUS) ×3 IMPLANT
COVER TIP SHEARS 8 DVNC (MISCELLANEOUS) ×1 IMPLANT
COVER TIP SHEARS 8MM DA VINCI (MISCELLANEOUS) ×2
COVER WAND RF STERILE (DRAPES) ×3 IMPLANT
CUTTER ECHEON FLEX ENDO 45 340 (ENDOMECHANICALS) ×4 IMPLANT
DECANTER SPIKE VIAL GLASS SM (MISCELLANEOUS) ×3 IMPLANT
DERMABOND ADVANCED (GAUZE/BANDAGES/DRESSINGS) ×2
DERMABOND ADVANCED .7 DNX12 (GAUZE/BANDAGES/DRESSINGS) ×2 IMPLANT
DRAIN CHANNEL 15F RND FF 3/16 (WOUND CARE) IMPLANT
DRAPE ARM DVNC X/XI (DISPOSABLE) ×4 IMPLANT
DRAPE COLUMN DVNC XI (DISPOSABLE) ×1 IMPLANT
DRAPE DA VINCI XI ARM (DISPOSABLE) ×8
DRAPE DA VINCI XI COLUMN (DISPOSABLE) ×2
DRAPE INCISE IOBAN 66X45 STRL (DRAPES) ×3 IMPLANT
DRAPE LAPAROSCOPIC ABDOMINAL (DRAPES) IMPLANT
DRAPE SHEET LG 3/4 BI-LAMINATE (DRAPES) ×3 IMPLANT
ELECT PENCIL ROCKER SW 15FT (MISCELLANEOUS) ×3 IMPLANT
ELECT REM PT RETURN 15FT ADLT (MISCELLANEOUS) ×3 IMPLANT
EVACUATOR SILICONE 100CC (DRAIN) IMPLANT
GLOVE BIO SURGEON STRL SZ 6.5 (GLOVE) ×2 IMPLANT
GLOVE BIO SURGEON STRL SZ7 (GLOVE) ×4 IMPLANT
GLOVE BIO SURGEONS STRL SZ 6.5 (GLOVE) ×1
GLOVE BIOGEL M STRL SZ7.5 (GLOVE) ×6 IMPLANT
GLOVE BIOGEL PI IND STRL 7.5 (GLOVE) IMPLANT
GLOVE BIOGEL PI INDICATOR 7.5 (GLOVE) ×4
GOWN STRL REUS W/TWL LRG LVL3 (GOWN DISPOSABLE) ×13 IMPLANT
HEMOSTAT SURGICEL 4X8 (HEMOSTASIS) ×2 IMPLANT
IRRIG SUCT STRYKERFLOW 2 WTIP (MISCELLANEOUS) ×3
IRRIGATION SUCT STRKRFLW 2 WTP (MISCELLANEOUS) ×1 IMPLANT
IV LACTATED RINGERS 1000ML (IV SOLUTION) ×2 IMPLANT
KIT BASIN OR (CUSTOM PROCEDURE TRAY) ×3 IMPLANT
KIT TURNOVER KIT A (KITS) IMPLANT
LOOP VESSEL MAXI BLUE (MISCELLANEOUS) ×2 IMPLANT
NDL INSUFFLATION 14GA 120MM (NEEDLE) ×1 IMPLANT
NEEDLE INSUFFLATION 14GA 120MM (NEEDLE) ×3 IMPLANT
PORT ACCESS TROCAR AIRSEAL 12 (TROCAR) ×1 IMPLANT
PORT ACCESS TROCAR AIRSEAL 5M (TROCAR) ×2
PROTECTOR NERVE ULNAR (MISCELLANEOUS) ×6 IMPLANT
RELOAD STAPLE 45 2.6 WHT THIN (STAPLE) IMPLANT
SEAL CANN UNIV 5-8 DVNC XI (MISCELLANEOUS) ×4 IMPLANT
SEAL XI 5MM-8MM UNIVERSAL (MISCELLANEOUS) ×8
SET TRI-LUMEN FLTR TB AIRSEAL (TUBING) ×3 IMPLANT
SOLUTION ELECTROLUBE (MISCELLANEOUS) ×3 IMPLANT
SPONGE LAP 4X18 RFD (DISPOSABLE) ×3 IMPLANT
STAPLE RELOAD 45 WHT (STAPLE) ×22 IMPLANT
STAPLE RELOAD 45MM WHITE (STAPLE) ×66
SUT ETHILON 3 0 PS 1 (SUTURE) IMPLANT
SUT MNCRL AB 4-0 PS2 18 (SUTURE) ×6 IMPLANT
SUT PDS AB 1 CT1 27 (SUTURE) ×11 IMPLANT
SUT VICRYL 0 UR6 27IN ABS (SUTURE) IMPLANT
SUT VLOC BARB 180 ABS3/0GR12 (SUTURE) ×3
SUTURE VLOC BRB 180 ABS3/0GR12 (SUTURE) IMPLANT
TOWEL OR 17X26 10 PK STRL BLUE (TOWEL DISPOSABLE) ×3 IMPLANT
TOWEL OR NON WOVEN STRL DISP B (DISPOSABLE) ×3 IMPLANT
TRAY FOLEY MTR SLVR 16FR STAT (SET/KITS/TRAYS/PACK) ×3 IMPLANT
TRAY LAPAROSCOPIC (CUSTOM PROCEDURE TRAY) ×3 IMPLANT
TROCAR BLADELESS OPT 5 100 (ENDOMECHANICALS) ×2 IMPLANT
TROCAR XCEL 12X100 BLDLESS (ENDOMECHANICALS) ×3 IMPLANT
WATER STERILE IRR 1000ML POUR (IV SOLUTION) ×3 IMPLANT

## 2018-06-22 NOTE — Discharge Instructions (Signed)

## 2018-06-22 NOTE — Transfer of Care (Signed)
Immediate Anesthesia Transfer of Care Note  Patient: [name]  Procedure(s) Performed: XI ROBOTIC ASSISTED LAPAROSCOPIC NEPHRECTOMY (Right )  Patient Location: PACU  Anesthesia Type:General  Level of Consciousness: awake, alert , oriented and patient cooperative  Airway & Oxygen Therapy: Patient Spontanous Breathing and Patient connected to face mask oxygen  Post-op Assessment: Report given to RN, Post -op Vital signs reviewed and stable and Patient moving all extremities  Post vital signs: Reviewed and stable  Last Vitals:  Vitals Value Taken Time  BP 153/85 06/22/2018  2:30 PM  Temp    Pulse 102 06/22/2018  2:37 PM  Resp 28 06/22/2018  2:37 PM  SpO2 98 % 06/22/2018  2:37 PM  Vitals shown include unvalidated device data.  Last Pain:  Vitals:   06/22/18 1415  TempSrc:   PainSc: Asleep      Patients Stated Pain Goal: 4 (17/53/01 0404)  Complications: No apparent anesthesia complications

## 2018-06-22 NOTE — Op Note (Signed)
NAME: [name], [name] MEDICAL RECORD UT:[id number] ACCOUNT [id number] DATE OF BIRTH:[birth year] FACILITY: [facility] LOCATION: [facility] PHYSICIAN:Codee Bloodworth, MD  OPERATIVE REPORT  DATE OF PROCEDURE:  06/22/2018  PREOPERATIVE DIAGNOSIS:  Very large right renal mass with neovascularity.  PROCEDURE:   Robotic-assisted laparoscopic right radical nephrectomy.  ESTIMATED BLOOD LOSS:  250 mL.  ASSISTANT:  Debbrah Alar, PA  FINDINGS: 1.  Single artery, single vein, right renovascular anatomy. 2.  Numerous parasitic vessels in all quadrants of the kidney.   3.  Significant tethering of the ascending colon and duodenum to mass.  No evidence of bowel injury.  INDICATIONS:  The patient is a very pleasant and quite vigorous 73 year old man who was found on workup of malaise, weight loss and upper GI symptoms to have a very large right renal mass worrisome for renal cell carcinoma. He  is having progressive trouble keeping things down and did not appear to be metastatic.  Options were discussed including surgical extirpation with curative intent versus palliative protocols.  The patient had decided to proceed with right radical  nephrectomy.  Informed consent was then placed in medical record.  DESCRIPTION OF PROCEDURE:  Until patient being [name],  procedure being right radical nephrectomy was confirmed.  Time out performed.  Antibiotics administered.  General endotracheal anesthesia induced.  The patient was placed into the right side  up full flank position with plane 15 degrees of table flexion, superior arm elevator axillary roll, sequential compression devices, bottom leg bent, top leg straight.  He was further fastened to operative table using 3 inch tape with foam padding across  the supraxiphoid chest and his pelvis.  Beanbag was deployed.  A superior arm elevator was used.  Axillary roll was used.  Sterile field was created by clipper shaving then prepping and draping the patient's  entire right flank and abdomen using  chlorhexidine gluconate after Foley catheter was placed via straight drain.  Next, a high-flow, low-pressure pneumoperitoneum was obtained using Veress technique in the right lower quadrant, having passed the aspiration and drop test.  An 8 mm robotic  camera port was then placed in position approximately 1 handbreadth superolateral to the umbilicus.  Laparoscopic examination peritoneal cavity revealed no significant adhesions, no visceral injury.  Distal ports were placed as follows:  Right subcostal  8 mm robotic port, right subxiphoid 5 mm liver retraction port, a midline superior 12 mm AirSeal assistant port approximately 3 fingerbreadths superior to the camera port plan, a 12 mm non AirSeal assist port in the midline in the supraumbilical crease,  a right lower paramedian 8 mm robotic port, right far lateral 8 mm robotic port 3 fingerbreadths superomedial to the anterior iliac spine.  Robot was docked and passed electronic checks.  Attention was directed at developing the retroperitoneum.  The  mass was very, very large and already innumerable parasitic vessels noted as expected.  A plane was carefully developed and lateral to the ascending colon from the area of the cecum towards the area of the hepatic flexure and the colon was carefully  swept medially.  The lower pole kidney area was identified, placed on gentle lateral traction.  Dissection proceeded medial to this, there were multiple large parasitic vessels along the lower pole.  These were controlled using a combination of clips and  vascular staplers.  The ureter and gonadal vessels were encountered, also placed on gentle lateral traction.  Dissection proceeded medial to this.  There were numerous parasitic vessels from the gonadal to the mass.  These were controlled using vascular  clips for stapling.  The duodenum was then encountered and very carefully kocherized medially.  There was significant  tethering adhesions of the duodenum just from the neoplastic desmoplastic reaction and exquisite care was taken to avoid injury which  did not occur.  Renal hilum consisted of a single artery, single vein, right renovascular anatomy.  It was quite superior and behind the plane of the inferior margin of the liver.  Artery was controlled using an extra-large Hem-o-Lok proximally followed  by a vascular staple load distally and the vein controlled using vascular stapler.  Superomedial attachments were taken down using vascular stapler performing a majority of the adrenal resection.  Some of the superior medial aspect of the adrenal was not  successful with this technique and an incredibly superior location of the structures.  Superior attachments were taken down using cautery scissors and a combination of vascular stapling as well as lateral attachments.  This completely freed up the very  large right radical nephrectomy specimen, which was placed partially into an extra-large a laparoscopic retrieval bag which did not completely fit.  Abdomen was inspected.  Again, there was some mild venous oozing in the area of prior parasitic vessels  close to the gonadal.  These were oversewn using 3-0 V-Loc.  Surgicel was applied over this resulted in excellent hemostasis.  The area of the duodenum and descending colon were once again inspected and found to be uninjured.  Robot was then undocked.   Specimen was retrieved by extending the previous assistant port sites in the midline, removing a large right radical nephrectomy specimen, setting aside for permanent pathology.  Extraction site was closed with fascia using figure-of-eight PDS x10  followed by reapproximation of Scarpa's with a running Vicryl.  All incision sites were infiltrated with dilute lipolyzed Marcaine and closed with skin using subcuticular Monocryl and Dermabond.  Procedure terminated.  The patient tolerated the procedure  well.  No immediate  complications.    The patient was taken to [facility] in stable condition.    Please note, first assistant Debbrah Alar was crucial for all portions of the surgery today.  She provided invaluable vascular clipping, vascular stapling, intraoperative retraction and general first assistance.  AN/NUANCE  D:06/22/2018 T:06/22/2018 JOB:[id number]

## 2018-06-22 NOTE — H&P (Signed)
[name] is an 73 y.o. male.    Chief Complaint: Pre-Op RIGHT Robotic Radical Nephrectomy  HPI:   1 - Large Right Renal Cancer - 11cm vascular anterior renal mass with central necrosis incidental on chest CT and then dedicated MRI 04/2018 on eval upper GI discomfort and dyspepsia. 1 artery / 1 vein right renovascular anatomy, but numorous parasitic vessels including upper (off inferior phrenic), mid (off lumbars) and inferior (off gonadal). NO overt renal vein / IVC involvment or bulky adenoparhy. NO chest lesions. Cr 1.1. Contralateral kidney anatomically normal.   2 - Prostate Cancer - s/p primary brachytherapy 2006 by Rosana Hoes. PSA 2019 0.04 consistent with biochemical cure.   PMH sig for left inguinal hernia repair (open), HTN, blood dyscrasia (follows [facility] with periodic phlebotomy, stable x years), No ischemic CV disease / blood thinners. His PCP is Janie Morning DO with [facility].   Today "[name]" is seen to proceed with RIGHT robotic radical nephrectomy for large renal cancer.    Past Medical History:  Diagnosis Date  . Anxiety   . Arthritis   . Diverticulitis   . Hypertension   . Inguinal hernia   . Polycythemia    phlebotomy 06/15/13  . Prostate cancer ([place])   . Wears glasses   . Wears hearing aid    both ears    Past Surgical History:  Procedure Laterality Date  . COLONOSCOPY    . 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 ESOPHAGOGASTRODUODENOSCOPY (EGD) WITH PROPOFOL N/A 05/12/2018   Procedure: ESOPHAGOGASTRODUODENOSCOPY (EGD) WITH PROPOFOL;  Surgeon: Laurence Spates, MD;  Location[facility];  Service: Endoscopy;  Laterality: N/A;  . HERNIA REPAIR  2010   lt ing with lt hydrocele  . RADIOACTIVE SEED IMPLANT  2006   hx of prostate  . SHOULDER ARTHROSCOPY  11/13   right     Family History  Problem Relation Age of Onset  . Heart attack Father   . Hypertension Father    Social History:  reports that he quit smoking about 48 years ago. He has never used smokeless tobacco. He reports that he does not drink alcohol or use drugs.  Allergies:  Allergies  Allergen Reactions  . Oxycodone-Acetaminophen Other (See Comments)    Patient stated,"it keeps me wide awake and I can't sleep."    Medications Prior to Admission  Medication Sig Dispense Refill  . acetaminophen (TYLENOL) 500 MG tablet Take 500 mg by mouth every 6 (six) hours as needed for moderate pain.    Marland Kitchen aspirin 81 MG tablet Take 81 mg by mouth daily.    . famotidine (PEPCID) 40 MG tablet Take 40 mg by mouth daily.     . fluticasone (FLONASE) 50 MCG/ACT nasal spray Place 1 spray into both nostrils daily.    . hydrochlorothiazide (HYDRODIURIL) 25 MG tablet Take 25 mg by mouth daily.    Marland Kitchen losartan (COZAAR) 100 MG tablet Take 100 mg by mouth daily.    . meloxicam (MOBIC) 15 MG tablet Take 15 mg by mouth daily as needed for pain.     . pravastatin (PRAVACHOL) 40 MG tablet Take 40 mg by mouth daily.      No results found for this or any previous visit (from the past 48 hour(s)). No results found.  Review of Systems  Constitutional: Negative.  Negative  for chills and fever.  HENT: Negative.   Eyes: Negative.   Respiratory: Negative.   Cardiovascular: Negative.   Gastrointestinal: Positive for abdominal pain and nausea. Negative for vomiting.  Genitourinary: Negative for hematuria.  Musculoskeletal: Negative.   Skin: Negative.   Neurological: Negative.   Endo/Heme/Allergies: Negative.   Psychiatric/Behavioral: Negative.     There were no vitals taken for this visit. Physical Exam  Constitutional: He appears well-developed.  HENT:  Head: Normocephalic.  Neck: Normal range of motion.  Cardiovascular: Normal rate.  Respiratory: Effort normal.  GI:  Palpable RUQ mass, stable.    Genitourinary:    Genitourinary Comments: No CVAT at present   Musculoskeletal: Normal range of motion.  Neurological: He is alert.  Skin: Skin is warm.  Psychiatric: He has a normal mood and affect.     Assessment/Plan  Proceed as planned with RIGHT robotic radical nephrectomy. Risks, benefits, alternatives, expected peri-op course discussed previously and reiterated today.   Alexis Frock, MD 06/22/2018, 6:39 AM

## 2018-06-22 NOTE — Brief Op Note (Signed)
06/22/2018  1:47 PM  PATIENT:  [name]  73 y.o. male  PRE-OPERATIVE DIAGNOSIS:  RIGHT RENAL CANCER  POST-OPERATIVE DIAGNOSIS:  RIGHT RENAL CANCER  PROCEDURE:  Procedure(s) with comments: XI ROBOTIC ASSISTED LAPAROSCOPIC NEPHRECTOMY (Right) - 3 HRS  SURGEON:  Surgeon(s) and Role:    * Alexis Frock, MD - Primary  PHYSICIAN ASSISTANT:   ASSISTANTS: Clemetine Marker PA   ANESTHESIA:   general  EBL:  250 mL   BLOOD ADMINISTERED:none  DRAINS: 1 - foley to gravity   LOCAL MEDICATIONS USED:  MARCAINE     SPECIMEN:  Source of Specimen:  Rt radical nephrectomy  DISPOSITION OF SPECIMEN:  PATHOLOGY  COUNTS:  YES  TOURNIQUET:  * No tourniquets in log *  DICTATION: .Other Dictation: Dictation Number  [id number]  PLAN OF CARE: Admit to inpatient   PATIENT DISPOSITION:  PACU - hemodynamically stable.   Delay start of Pharmacological VTE agent (>24hrs) due to surgical blood loss or risk of bleeding: yes

## 2018-06-22 NOTE — Anesthesia Postprocedure Evaluation (Signed)
Anesthesia Post Note  Patient: [name]  Procedure(s) Performed: XI ROBOTIC ASSISTED LAPAROSCOPIC NEPHRECTOMY (Right )     Patient location during evaluation: PACU Anesthesia Type: General Level of consciousness: awake and alert Pain management: pain level controlled Vital Signs Assessment: post-procedure vital signs reviewed and stable Respiratory status: spontaneous breathing, nonlabored ventilation, respiratory function stable and patient connected to nasal cannula oxygen Cardiovascular status: blood pressure returned to baseline, stable and tachycardic Postop Assessment: no apparent nausea or vomiting Anesthetic complications: no    Last Vitals:  Vitals:   06/22/18 1445 06/22/18 1500  BP:    Pulse: (!) 102 (!) 102  Resp: 20   Temp:    SpO2:      Last Pain:  Vitals:   06/22/18 1445  TempSrc:   PainSc: [name]

## 2018-06-22 NOTE — Anesthesia Procedure Notes (Signed)
Procedure Name: Intubation Date/Time: 06/22/2018 8:35 AM Performed by: Mitzie Na, CRNA Pre-anesthesia Checklist: Patient identified, Emergency Drugs available, Suction available, Patient being monitored and Timeout performed Patient Re-evaluated:Patient Re-evaluated prior to induction Oxygen Delivery Method: Circle system utilized Preoxygenation: Pre-oxygenation with 100% oxygen Induction Type: IV induction and Rapid sequence Laryngoscope Size: Mac and 4 Grade View: Grade II Tube type: Oral Tube size: 7.5 mm Number of attempts: 1 Airway Equipment and Method: Stylet Placement Confirmation: ETT inserted through vocal cords under direct vision,  positive ETCO2 and breath sounds checked- equal and bilateral Secured at: 22 cm Tube secured with: Tape Dental Injury: Teeth and Oropharynx as per pre-operative assessment

## 2018-06-23 ENCOUNTER — Encounter (HOSPITAL_COMMUNITY): Payer: Self-pay | Admitting: Urology

## 2018-06-23 LAB — BASIC METABOLIC PANEL
Anion gap: 9 (ref 5–15)
BUN: 18 mg/dL (ref 8–23)
CO2: 25 mmol/L (ref 22–32)
Calcium: 8.8 mg/dL — ABNORMAL LOW (ref 8.9–10.3)
Chloride: 98 mmol/L (ref 98–111)
Creatinine, Ser: 1.32 mg/dL — ABNORMAL HIGH (ref 0.61–1.24)
GFR calc Af Amer: >60 mL/min (ref >60)
GFR calc non Af Amer: 54 mL/min — ABNORMAL LOW (ref >60)
Glucose, Bld: 152 mg/dL — ABNORMAL HIGH (ref 70–99)
Potassium: 4.5 mmol/L (ref 3.5–5.1)
Sodium: 132 mmol/L — ABNORMAL LOW (ref 135–145)

## 2018-06-23 LAB — HEMOGLOBIN AND HEMATOCRIT, BLOOD
HCT: 51.7 % (ref 39.0–52.0)
Hemoglobin: 14.5 g/dL (ref 13.0–17.0)

## 2018-06-23 MED ORDER — BOOST / RESOURCE BREEZE PO LIQD CUSTOM: 1.0000 | ORAL | Status: DC

## 2018-06-23 MED ORDER — ENSURE ENLIVE PO LIQD: 237.0000 mL | ORAL | Status: DC

## 2018-06-23 MED ORDER — ADULT MULTIVITAMIN LIQUID CH
15.0000 mL | Freq: Every day | ORAL | Status: DC
  Filled 2018-06-23: qty 15

## 2018-06-23 NOTE — Progress Notes (Signed)
Initial Nutrition Assessment  RD working remotely.   DOCUMENTATION CODES:   Not applicable  INTERVENTION:  - will order Boost Breeze one/day, each supplement provides 250 kcal and 9 grams of protein. - will order Ensure Enlive once/day, each supplement provides 350 kcal and 20 grams of protein. - will order daily multivitamin with minerals. - continue to encourage PO intakes.    NUTRITION DIAGNOSIS:   Increased nutrient needs related to catabolic illness, cancer and cancer related treatments, post-op healing as evidenced by estimated needs.  GOAL:   Patient will meet greater than or equal to 90% of their needs  MONITOR:   PO intake, Supplement acceptance, Weight trends, Labs  REASON FOR ASSESSMENT:   Malnutrition Screening Tool  ASSESSMENT:   73 year-old male with hx of prostate cancer s/p therapy in 2006, L inguinal hernia repair, HTN, blood dyscrasia with periodic phlebotomy, and recent dx of large R renal cancer following MRI in February 2020. Patient presented to the hospital for planned R robotic radical nephrectomy d/t large renal mass/cancer.   BMI indicates overweight status, appropriate for age. Patient is POD #1 R nephrectomy. Patient reports abdominal pain and intermittent for the past at least 1 month which was affecting his appetite/desire to eat at times but most of the time he was able to eat without issue or exacerbating symptoms. He has not yet had breakfast this AM.  Per chart review, current weight is 171 lb, weight on 2/27 was 170 lb, and weight on 1/24 was 178 lb. This indicates 7 lb weight loss (4% body weight) in the past 2.5 months; not significant for time frame.   Medications reviewed; 100 mg colace BID, 40 mg oral pepcid/day Labs reviewed; Na: 132 mmol/l, creatinine: 1.32 mg/dl, GFR: 54 ml/min.  IVF; D5-NS @ 50 ml/hr (204 kcal).     NUTRITION - FOCUSED PHYSICAL EXAM:  Unable to complete at this time.   Diet Order:   Diet Order           Diet regular Room service appropriate? Yes; Fluid consistency: Thin  Diet effective now              EDUCATION NEEDS:   No education needs have been identified at this time  Skin:  Skin Assessment: Skin Integrity Issues: Skin Integrity Issues:: Incisions Incisions: abdomen (4/8)  Last BM:  4/8  Height:   Ht Readings from Last 1 Encounters:  06/22/18 5\' 8"  (1.727 m)    Weight:   Wt Readings from Last 1 Encounters:  06/22/18 77.7 kg    Ideal Body Weight:  70 kg  BMI:  Body mass index is 26.05 kg/m.  Estimated Nutritional Needs:   Kcal:  1940-2175 kcal  Protein:  85-95 grams  Fluid:  >/= 2 L/day     [name] Matin, MS, RD, LDN, [facility] Inpatient Clinical Dietitian Pager # [phone] After hours/weekend pager # [phone]

## 2018-06-23 NOTE — Discharge Summary (Signed)
Physician Discharge Summary  Patient ID: [name] MRN: [id number] DOB/AGE: 06-12-1945 73 y.o.  Admit date: 06/22/2018 Discharge date: 06/23/2018  Admission Diagnoses: Large RIGHT Renal Mass  Discharge Diagnoses:  Active Problems:   Renal mass   Discharged Condition: good  Hospital Course:   Pt underwent RIGHT robotic radical nephrectomy on 06/22/18, the day of admission, without acute complications. By the afternoon of POD 1 he is ambulatory, pain controlled with PO meds, maintaining PO hydration/nutrition, voiding with catheter removed, and felt to be adequate for discharge. Final path pending. Hgb 14.5, Cr  1.32.   Consults: None  Significant Diagnostic Studies: labs: as per above  Treatments: surgery: as per above  Discharge Exam: Blood pressure 118/76, pulse 68, temperature 98.6 F (37 C), temperature source Oral, resp. rate 18, height 5\' 8"  (1.727 m), weight 77.7 kg, SpO2 93 %. General appearance: alert, cooperative and vigorous for age Eyes: negative Nose: Nares normal. Septum midline. Mucosa normal. No drainage or sinus tenderness. Throat: lips, mucosa, and tongue normal; teeth and gums normal Neck: supple, symmetrical, trachea midline Back: symmetric, no curvature. ROM normal. No CVA tenderness. Resp: non-labored on room air.  Cardio: Nl rate GI: soft, non-tender; bowel sounds normal; no masses,  no organomegaly Male genitalia: normal Extremities: extremities normal, atraumatic, no cyanosis or edema Skin: Skin color, texture, turgor normal. No rashes or lesions Lymph nodes: Cervical, supraclavicular, and axillary nodes normal. Neurologic: Grossly normal Incision/Wound: Recent incision sites c/d/i. No hematomas or hernias.   Disposition:    Allergies as of 06/23/2018      Reactions   Oxycodone-acetaminophen Other (See Comments)   Patient stated,"it keeps me wide awake and I can't sleep."      Medication List    STOP taking these medications   aspirin 81  MG tablet   meloxicam 15 MG tablet Commonly known as:  MOBIC     TAKE these medications   acetaminophen 500 MG tablet Commonly known as:  TYLENOL Take 500 mg by mouth every 6 (six) hours as needed for moderate pain.   busPIRone 5 MG tablet Commonly known as:  BUSPAR Take 5 mg by mouth 2 (two) times daily as needed (for anxiety).   famotidine 40 MG tablet Commonly known as:  PEPCID Take 40 mg by mouth daily.   fluticasone 50 MCG/ACT nasal spray Commonly known as:  FLONASE Place 1 spray into both nostrils daily.   hydrochlorothiazide 25 MG tablet Commonly known as:  HYDRODIURIL Take 25 mg by mouth daily.   HYDROcodone-acetaminophen 5-325 MG tablet Commonly known as:  Norco Take 1-2 tablets by mouth every 6 (six) hours as needed for moderate pain or severe pain.   losartan 100 MG tablet Commonly known as:  COZAAR Take 100 mg by mouth daily.   pravastatin 40 MG tablet Commonly known as:  PRAVACHOL Take 40 mg by mouth daily.      Follow-up Information    Alexis Frock, MD On 07/11/2018.   Specialty:  Urology Why:  at 2:45 for MD visit.  Contact information: [address] [address] [phone]           Signed: Alexis Frock 06/23/2018, 3:55 PM

## 2018-06-23 NOTE — Progress Notes (Addendum)
Urology Progress Note   1 Day Post-Op  Subjective:  1. Large right renal mass - s/p Right robotic radical nephrectomy 06/22/18. Now POD1. Stable and recovering well. Lab work stable, small expected bump in Cr to 1.3. adequate UOP. Will plan to discontinue foley, ADAT, ambulate, medlock when adequate PO intake. Possible dc home this afternoon vs tomorrow   Objective: Vital signs in last 24 hours: Temp:  [97.8 F (36.6 C)-98.6 F (37 C)] 97.9 F (36.6 C) (04/09 1610) Pulse Rate:  [73-107] 79 (04/09 0638) Resp:  [13-24] 18 (04/09 9604) BP: (119-166)/(63-96) 131/83 (04/09 0638) SpO2:  [94 %-100 %] 95 % (04/09 5409) Weight:  [77.7 kg] 77.7 kg (04/08 1538)  Intake/Output from previous day: 04/08 0701 - 04/09 0700 In: 4272.6 [P.O.:840; I.V.:3332.6; IV Piggyback:100] Out: 1975 [Urine:1725; Blood:250] Intake/Output this shift: No intake/output data recorded.  Physical Exam:  General: Alert and oriented CV: RRR Lungs: Clear Abdomen: Soft, appropriately tender. Incisions clean dry and intact with surgical glue GU: Foley removed Ext: NT, No erythema  Lab Results: Recent Labs    06/22/18 1422 06/23/18 0450  HGB 15.2 14.5  HCT 54.5* 51.7   BMET Recent Labs    06/23/18 0450  NA 132*  K 4.5  CL 98  CO2 25  GLUCOSE 152*  BUN 18  CREATININE 1.32*  CALCIUM 8.8*     Studies/Results: No results found.  Assessment/Plan:  73 y.o. male s/p Right robotic radical nephrectomy.  Overall doing well post-op.   - Plan as above - Possible discharge home this afternoon vs tomorrow   Dispo: floor   LOS: 1 day   Fredricka Bonine 06/23/2018, 7:17 AM   I have seen and examined the patient and agree with Dr. Wayland Denis plan.  Briefly,  S: POD 1 s/p RIGHT radical nephrectomy for large vascular mass. Pain with movement only, no emesis.  O: NAD, AOx3 Non-labored breathing on minimal [place] O2 RRR Recent surgical sites c/d/i Foley with thin yellow uirne, some dried blood at  meatus, removed SCD's in place w/o focal swelling  A/P: Doing very well POD 1. DC goals discussed.

## 2018-06-29 ENCOUNTER — Other Ambulatory Visit: Payer: Self-pay | Admitting: *Deleted

## 2018-06-29 NOTE — Patient Outreach (Signed)
[facility]) Care Management  06/29/2018  BRANDT CHANEY Apr 27, 1945 [id number]   Subjective: Telephone call to patient's home  / mobile number, spoke with male answering phone,states she is [place] wife, Mr. Jeffries currently unavailable, left HIPAA compliant message, and requested call back.  Advised wife unable to discuss nature of the call without Mr. Haverstock permission and wife voices understanding.      Objective: Per KPN (Knowledge Performance Now, point of care tool) and chart review, patient hospitalized 06/22/2018 - 06/23/2018 for Large Right Renal Cancer, status post Robotic-assisted laparoscopic right radical nephrectomy.   Patient also has a history of hypertension, prostate cancer, diverticulitis, Inguinal hernia, and Polycythemia.       Assessment: Received Medicare EMMI General Discharge Red Flag Alert follow up referral on 06/29/2018.  Red Flag Alert Triggers, Day #4, times 2, patient answered yes to the following questions: Lost interest in things?  Sad/hopeless/anxious/empty?   [facility] EMMI follow up pending patient contact.       Plan: RNCM will send unsuccessful outreach letter, [facility] pamphlet, handout: Know Before You Go, will call patient for 2nd telephone outreach attempt within 4 business days, [facility] EMMI follow up, and proceed with case closure, within 10 business days if no return call.       Malea Swilling H. Annia Friendly, BSN, [place] Management [facility] Telephonic CM Phone: [phone] Fax: [phone]

## 2018-06-30 ENCOUNTER — Other Ambulatory Visit: Payer: Self-pay | Admitting: *Deleted

## 2018-06-30 NOTE — Patient Outreach (Signed)
[facility]) Care Management  06/30/2018  SAABIR BLYTH 1945/06/06 [id number]   Subjective: Received voicemail from Diana Eves, states he is returning call, and requested call back.  Telephone call to patient's home / mobile number, spoke with patient, and HIPAA verified.  Discussed [facility] Care Management Medicare EMMI General Discharge Red Flag Alert follow up, patient voiced understanding, and is in agreement to follow up.  Patient states he remembers receiving the EMMI automated calls.  States he is plugging right a long, taking it day by day, realized life goes on, has a lot to be worried about, not interested in doing much because of his current health issues, has concerns about if his recovery is on track, concerns about cancer status, continuing to have a lot of burping, has a lot of gas,  continuing to have a lot of digestive issues, has not received path report to date, and taking anxiety medication as prescribed.  Discussed his concerns, encouraged patient to follow up with providers regarding concerns, patient voiced understanding, is in agreement, and states he will follow up with MD's as needed.    States his gas is better with movement, he is aware of signs / symptoms to report to MD, he was told by MD that mass may have been pressing on his digestive system which may be the cause of some of his symptoms,  and  surgeon gave him an overall of what to expect from surgery prior to surgery.    States he feels better after discussing his concerns, is doing better than he thought, has a supportive family / friends network, and has assistance as needed.  States he has also spoken with family friend who was a past surgical nurse and feels his recovery is going well.  States he has a follow up appointment with surgeon on 07/11/2018 and with primary MD on 07/18/2018.  States he does not have a scheduled follow up appointment with gastroenterologist at this time and will schedule follow  up appointment if needed.   Discussed [facility] Care Management services, is aware of community resources through the cancer center for behavioral health, declined [place] Management Social Worker referral, and states he will access resources if needed in the future.   Patient states he does not have any education material, EMMI follow up, care coordination, care management, disease monitoring, transportation, community resource, or pharmacy needs at this time.  States he is very appreciative of the follow up and is in agreement to receive [facility] Management information.      Objective: Per KPN (Knowledge Performance Now, point of care tool) and chart review, patient hospitalized 06/22/2018 - 06/23/2018 for Large Right Renal Cancer, status post Robotic-assisted laparoscopic right radical nephrectomy.   Patient also has a history of hypertension, prostate cancer, diverticulitis, Inguinal hernia, and Polycythemia.       Assessment: Received Medicare EMMI General Discharge Red Flag Alert follow up referral on 06/29/2018.  Red Flag Alert Triggers, Day #4, times 2, patient answered yes to the following questions: Lost interest in things?  Sad/hopeless/anxious/empty?   EMMI follow up completed and no further care management needs.       Plan: RNCM will send patient successful outreach letter, [facility] pamphlet, and magnet. RNCM will complete case closure due to follow up completed / no care management needs.       Kamree Wiens H. Annia Friendly, BSN, [place] Management [facility] Telephonic CM Phone: [phone] Fax: [phone]

## 2018-07-11 DIAGNOSIS — C61 Malignant neoplasm of prostate: Secondary | ICD-10-CM | POA: Diagnosis not present

## 2018-07-11 DIAGNOSIS — C641 Malignant neoplasm of right kidney, except renal pelvis: Secondary | ICD-10-CM | POA: Diagnosis not present

## 2018-09-26 DIAGNOSIS — E7800 Pure hypercholesterolemia, unspecified: Secondary | ICD-10-CM | POA: Diagnosis not present

## 2018-09-26 DIAGNOSIS — E785 Hyperlipidemia, unspecified: Secondary | ICD-10-CM | POA: Diagnosis not present

## 2018-09-26 DIAGNOSIS — I10 Essential (primary) hypertension: Secondary | ICD-10-CM | POA: Diagnosis not present

## 2018-09-28 DIAGNOSIS — K4090 Unilateral inguinal hernia, without obstruction or gangrene, not specified as recurrent: Secondary | ICD-10-CM | POA: Diagnosis not present

## 2018-09-28 DIAGNOSIS — I861 Scrotal varices: Secondary | ICD-10-CM | POA: Diagnosis not present

## 2018-10-03 DIAGNOSIS — Z7189 Other specified counseling: Secondary | ICD-10-CM | POA: Diagnosis not present

## 2018-10-03 DIAGNOSIS — C61 Malignant neoplasm of prostate: Secondary | ICD-10-CM | POA: Diagnosis not present

## 2018-10-03 DIAGNOSIS — E785 Hyperlipidemia, unspecified: Secondary | ICD-10-CM | POA: Diagnosis not present

## 2018-10-03 DIAGNOSIS — N509 Disorder of male genital organs, unspecified: Secondary | ICD-10-CM | POA: Diagnosis not present

## 2018-10-03 DIAGNOSIS — Z0000 Encounter for general adult medical examination without abnormal findings: Secondary | ICD-10-CM | POA: Diagnosis not present

## 2018-10-03 DIAGNOSIS — Z905 Acquired absence of kidney: Secondary | ICD-10-CM | POA: Diagnosis not present

## 2018-10-03 DIAGNOSIS — N50819 Testicular pain, unspecified: Secondary | ICD-10-CM | POA: Diagnosis not present

## 2018-10-03 DIAGNOSIS — D45 Polycythemia vera: Secondary | ICD-10-CM | POA: Diagnosis not present

## 2018-10-03 DIAGNOSIS — I10 Essential (primary) hypertension: Secondary | ICD-10-CM | POA: Diagnosis not present

## 2018-10-03 DIAGNOSIS — C641 Malignant neoplasm of right kidney, except renal pelvis: Secondary | ICD-10-CM | POA: Diagnosis not present

## 2018-10-05 ENCOUNTER — Other Ambulatory Visit: Payer: Self-pay | Admitting: Family Medicine

## 2018-10-05 DIAGNOSIS — C641 Malignant neoplasm of right kidney, except renal pelvis: Secondary | ICD-10-CM

## 2018-10-06 ENCOUNTER — Ambulatory Visit
Admission: RE | Admit: 2018-10-06 | Discharge: 2018-10-06 | Disposition: A | Discharge status: Home / Self Care | Payer: Medicare Other | Source: Ambulatory Visit | Attending: Family Medicine | Admitting: Family Medicine

## 2018-10-06 ENCOUNTER — Other Ambulatory Visit: Payer: Self-pay

## 2018-10-06 ENCOUNTER — Other Ambulatory Visit: Payer: Medicare Other

## 2018-10-06 DIAGNOSIS — K4090 Unilateral inguinal hernia, without obstruction or gangrene, not specified as recurrent: Secondary | ICD-10-CM | POA: Diagnosis not present

## 2018-10-06 DIAGNOSIS — C641 Malignant neoplasm of right kidney, except renal pelvis: Secondary | ICD-10-CM

## 2018-10-06 DIAGNOSIS — C61 Malignant neoplasm of prostate: Secondary | ICD-10-CM | POA: Diagnosis not present

## 2018-10-06 DIAGNOSIS — D751 Secondary polycythemia: Secondary | ICD-10-CM

## 2018-10-06 MED ORDER — IOPAMIDOL (ISOVUE-300) INJECTION 61%
100.0000 mL | Freq: Once | INTRAVENOUS | Status: AC | PRN
  Administered 2018-10-06: 100 mL via INTRAVENOUS

## 2018-10-07 ENCOUNTER — Inpatient Hospital Stay (HOSPITAL_BASED_OUTPATIENT_CLINIC_OR_DEPARTMENT_OTHER): Payer: Medicare Other | Admitting: Oncology

## 2018-10-07 ENCOUNTER — Inpatient Hospital Stay: Payer: Medicare Other | Attending: Oncology

## 2018-10-07 ENCOUNTER — Other Ambulatory Visit: Payer: Self-pay

## 2018-10-07 ENCOUNTER — Inpatient Hospital Stay: Payer: Medicare Other

## 2018-10-07 VITALS — BP 138/94 | HR 64 | Resp 16

## 2018-10-07 VITALS — BP 159/94 | HR 69 | Temp 98.3°F | Resp 17 | Ht 68.0 in | Wt 181.9 lb

## 2018-10-07 DIAGNOSIS — D751 Secondary polycythemia: Secondary | ICD-10-CM | POA: Diagnosis not present

## 2018-10-07 DIAGNOSIS — R102 Pelvic and perineal pain: Secondary | ICD-10-CM | POA: Diagnosis not present

## 2018-10-07 DIAGNOSIS — R1909 Other intra-abdominal and pelvic swelling, mass and lump: Secondary | ICD-10-CM

## 2018-10-07 DIAGNOSIS — C641 Malignant neoplasm of right kidney, except renal pelvis: Secondary | ICD-10-CM

## 2018-10-07 LAB — CBC WITH DIFFERENTIAL (CANCER CENTER ONLY)
Abs Immature Granulocytes: 0.02 10*3/uL (ref 0.00–0.07)
Basophils Absolute: 0.1 10*3/uL (ref 0.0–0.1)
Basophils Relative: 1 %
Eosinophils Absolute: 0.3 10*3/uL (ref 0.0–0.5)
Eosinophils Relative: 4 %
HCT: 57.6 % — ABNORMAL HIGH (ref 39.0–52.0)
Hemoglobin: 18.4 g/dL — ABNORMAL HIGH (ref 13.0–17.0)
Immature Granulocytes: 0 %
Lymphocytes Relative: 19 %
Lymphs Abs: 1.4 10*3/uL (ref 0.7–4.0)
MCH: 26.1 pg (ref 26.0–34.0)
MCHC: 31.9 g/dL (ref 30.0–36.0)
MCV: 81.7 fL (ref 80.0–100.0)
Monocytes Absolute: 0.7 10*3/uL (ref 0.1–1.0)
Monocytes Relative: 10 %
Neutro Abs: 4.7 10*3/uL (ref 1.7–7.7)
Neutrophils Relative %: 66 %
Platelet Count: 240 10*3/uL (ref 150–400)
RBC: 7.05 MIL/uL — ABNORMAL HIGH (ref 4.22–5.81)
RDW: 14 % (ref 11.5–15.5)
WBC Count: 7.1 10*3/uL (ref 4.0–10.5)
nRBC: 0 % (ref 0.0–0.2)

## 2018-10-07 NOTE — Progress Notes (Signed)
Hematology and Oncology Follow Up Visit  [name] [id number] [birth year] 73 y.o. 10/07/2018 8:41 AM [name] Raid, MD   Principle Diagnosis: 73 year old man with:  1.  Renal cell carcinoma diagnosed in April 2020.  He is status post radical nephrectomy completed by Dr. Barbee Cough on June 22, 2018.  2. Secondary polycythemia diagnosed in 2011.  Work-up for myeloproliferative disorder is unremarkable.   Prior Therapy:   Therapeutic phlebotomy as needed.  He status post radical nephrectomy April 2020.  The final pathology showed clear cell histology invading into the perineural fat indicating stage T3a.  Tumor margins are negative.  Current therapy: Phlebotomy every 6 months to keep his hematocrit less than 45.  Interim History:  [name] is here for repeat evaluation.  Since the last visit, he was diagnosed with renal cell carcinoma after an incidental finding on a CT scan of the chest in February 2020.  He had a large right kidney mass after obtaining CT scan of the chest.  He subsequently underwent an MRI of the abdomen on May 01, 2018 which showed 11 cm right renal mass without any lymphadenopathy.  He subsequently underwent surgical resection by Dr. Tresa Moore in April 2020.  Since his discharge, he has recovered reasonably well and resumed most activities of daily living.  The last few days he started noticing pain in his right groin and had a CT scan done on 10/06/2018 for evaluation.  Results are not available as of yet.  He has also had issues regulating his blood pressure since his nephrectomy at this time.  He denies any recent thrombosis or bleeding issues.  He denied any alteration mental status, neuropathy, confusion or dizziness.  Denies any headaches or lethargy.  Denies any night sweats, weight loss or changes in appetite.  Denied orthopnea, dyspnea on exertion or chest discomfort.  Denies shortness of breath, difficulty breathing hemoptysis or cough.  Denies  any abdominal distention, nausea, early satiety or dyspepsia.  Denies any hematuria, frequency, dysuria or nocturia.  Denies any skin irritation, dryness or rash.  Denies any ecchymosis or petechiae.  Denies any lymphadenopathy or clotting.  Denies any heat or cold intolerance.  Denies any anxiety or depression.  Remaining review of system is negative.     Medications: Updated on review. Current Outpatient Medications  Medication Sig Dispense Refill  . acetaminophen (TYLENOL) 500 MG tablet Take 500 mg by mouth every 6 (six) hours as needed for moderate pain.    . busPIRone (BUSPAR) 5 MG tablet Take 5 mg by mouth 2 (two) times daily as needed (for anxiety).    . famotidine (PEPCID) 40 MG tablet Take 40 mg by mouth daily.     . fluticasone (FLONASE) 50 MCG/ACT nasal spray Place 1 spray into both nostrils daily.    . hydrochlorothiazide (HYDRODIURIL) 25 MG tablet Take 25 mg by mouth daily.    Marland Kitchen HYDROcodone-acetaminophen (NORCO) 5-325 MG tablet Take 1-2 tablets by mouth every 6 (six) hours as needed for moderate pain or severe pain. 30 tablet 0  . losartan (COZAAR) 100 MG tablet Take 100 mg by mouth daily.    . pravastatin (PRAVACHOL) 40 MG tablet Take 40 mg by mouth daily.     No current facility-administered medications for this visit.      Allergies:  Allergies  Allergen Reactions  . Oxycodone-Acetaminophen Other (See Comments)    Patient stated,"it keeps me wide awake and I can't sleep."    Past Medical History, Surgical history, Social  history, and Family History without any changes on review.  Physical exam: Blood pressure (!) 159/94, pulse 69, temperature 98.3 F (36.8 C), temperature source Oral, resp. rate 17, height 5\' 8"  (1.727 m), weight 181 lb 14.4 oz (82.5 kg), SpO2 99 %.    ECOG: 0    General appearance: Comfortable appearing without any discomfort Head: Normocephalic without any trauma Oropharynx: Mucous membranes are moist and pink without any thrush or  ulcers. Eyes: Pupils are equal and round reactive to light. Lymph nodes: No cervical, supraclavicular, inguinal or axillary lymphadenopathy.   Heart:regular rate and rhythm.  S1 and S2 without leg edema. Lung: Clear without any rhonchi or wheezes.  No dullness to percussion. Abdomin: Soft, nontender, nondistended with good bowel sounds.  No hepatosplenomegaly. GU examination: Tender palpable lump noted in his right groin. Musculoskeletal: No joint deformity or effusion.  Full range of motion noted. Neurological: No deficits noted on motor, sensory and deep tendon reflex exam. Skin: No petechial rash or dryness.  Appeared moist.     Lab Results: Lab Results  Component Value Date   WBC 6.4 06/13/2018   HGB 14.5 06/23/2018   HCT 51.7 06/23/2018   MCV 70.9 (L) 06/13/2018   PLT 408 (H) 06/13/2018     Chemistry      Component Value Date/Time   NA 132 (L) 06/23/2018 0450   NA 141 01/18/2015 0835   K 4.5 06/23/2018 0450   K 4.1 01/18/2015 0835   CL 98 06/23/2018 0450   CO2 25 06/23/2018 0450   CO2 25 01/18/2015 0835   BUN 18 06/23/2018 0450   BUN 21.5 01/18/2015 0835   CREATININE 1.32 (H) 06/23/2018 0450   CREATININE 1.1 01/18/2015 0835      Component Value Date/Time   CALCIUM 8.8 (L) 06/23/2018 0450   CALCIUM 9.8 01/18/2015 0835   ALKPHOS 66 01/18/2015 0835   AST 15 01/18/2015 0835   ALT 16 01/18/2015 0835   BILITOT 0.91 01/18/2015 0835         Impression and Plan:  73 year old man with:  1.  Secondary polycythemia: Work-up for polycythemia vera has been negative in the past.  Etiology likely reactive possibly related to renal cell carcinoma.  His hemoglobin today persistently high despite his previous nephrectomy.  Risks and benefits of proceeding with phlebotomy was reviewed today.  Given his persistent polycythemia I recommended proceeding with phlebotomy today for better control of his blood pressure moving forward.   2. Thrombosis prophylaxis: No recent  thrombosis noted.    3.  Renal cell carcinoma: The status post nephrectomy without any evidence of disease relapse or recurrence.  The natural course of this disease was reviewed and at this time I recommended continued active surveillance which he is receiving under the care of Dr. Tresa Moore.   4.  Inguinal mass and pain: CT scan currently pending and has follow-up with Dr. Tresa Moore regarding this issue.  If malignancy is detected then further work-up may be needed at this time.  Malignancy risk is low at this time.   5. Followup: In 3 months for repeat evaluation.  25 minutes was spent today face-to-face with the patient.  More than 50% of that time was dedicated to reviewing his disease status, reviewing imaging studies and management options in the future.Zola Button, MD 7/24/20208:41 AM

## 2018-10-07 NOTE — Patient Instructions (Signed)

## 2018-10-10 ENCOUNTER — Telehealth: Payer: Self-pay | Admitting: Oncology

## 2018-10-10 NOTE — Telephone Encounter (Signed)
Called and spoke with patient. Confirmed date and time of appt  ° °

## 2018-10-11 DIAGNOSIS — Z905 Acquired absence of kidney: Secondary | ICD-10-CM | POA: Diagnosis not present

## 2018-10-11 DIAGNOSIS — C61 Malignant neoplasm of prostate: Secondary | ICD-10-CM | POA: Diagnosis not present

## 2018-10-11 DIAGNOSIS — N452 Orchitis: Secondary | ICD-10-CM | POA: Diagnosis not present

## 2018-10-11 DIAGNOSIS — I861 Scrotal varices: Secondary | ICD-10-CM | POA: Diagnosis not present

## 2018-10-11 DIAGNOSIS — N281 Cyst of kidney, acquired: Secondary | ICD-10-CM | POA: Diagnosis not present

## 2018-10-11 DIAGNOSIS — C641 Malignant neoplasm of right kidney, except renal pelvis: Secondary | ICD-10-CM | POA: Diagnosis not present

## 2018-10-24 DIAGNOSIS — C641 Malignant neoplasm of right kidney, except renal pelvis: Secondary | ICD-10-CM | POA: Diagnosis not present

## 2018-10-24 DIAGNOSIS — N452 Orchitis: Secondary | ICD-10-CM | POA: Diagnosis not present

## 2018-10-24 DIAGNOSIS — Z905 Acquired absence of kidney: Secondary | ICD-10-CM | POA: Diagnosis not present

## 2018-10-24 DIAGNOSIS — C61 Malignant neoplasm of prostate: Secondary | ICD-10-CM | POA: Diagnosis not present

## 2018-10-24 DIAGNOSIS — I861 Scrotal varices: Secondary | ICD-10-CM | POA: Diagnosis not present

## 2018-10-27 ENCOUNTER — Other Ambulatory Visit (HOSPITAL_COMMUNITY)
Admission: RE | Admit: 2018-10-27 | Discharge: 2018-10-27 | Disposition: A | Discharge status: Home / Self Care | Payer: Medicare Other | Source: Ambulatory Visit | Attending: Urology | Admitting: Urology

## 2018-10-27 ENCOUNTER — Other Ambulatory Visit: Payer: Self-pay | Admitting: Urology

## 2018-10-27 DIAGNOSIS — K5730 Diverticulosis of large intestine without perforation or abscess without bleeding: Secondary | ICD-10-CM | POA: Diagnosis not present

## 2018-10-27 DIAGNOSIS — N432 Other hydrocele: Secondary | ICD-10-CM | POA: Diagnosis not present

## 2018-10-27 DIAGNOSIS — Z01812 Encounter for preprocedural laboratory examination: Secondary | ICD-10-CM | POA: Insufficient documentation

## 2018-10-27 DIAGNOSIS — Z20828 Contact with and (suspected) exposure to other viral communicable diseases: Secondary | ICD-10-CM | POA: Diagnosis not present

## 2018-10-27 DIAGNOSIS — C6291 Malignant neoplasm of right testis, unspecified whether descended or undescended: Secondary | ICD-10-CM | POA: Diagnosis not present

## 2018-10-27 DIAGNOSIS — Z905 Acquired absence of kidney: Secondary | ICD-10-CM | POA: Diagnosis not present

## 2018-10-27 DIAGNOSIS — C61 Malignant neoplasm of prostate: Secondary | ICD-10-CM | POA: Diagnosis not present

## 2018-10-27 DIAGNOSIS — N5089 Other specified disorders of the male genital organs: Secondary | ICD-10-CM | POA: Diagnosis not present

## 2018-10-27 DIAGNOSIS — C641 Malignant neoplasm of right kidney, except renal pelvis: Secondary | ICD-10-CM | POA: Diagnosis not present

## 2018-10-27 LAB — SARS CORONAVIRUS 2 (TAT 6-24 HRS): SARS Coronavirus 2: NEGATIVE

## 2018-10-28 ENCOUNTER — Encounter (HOSPITAL_COMMUNITY)
Admission: RE | Admit: 2018-10-28 | Discharge: 2018-10-28 | Disposition: A | Discharge status: Home / Self Care | Payer: Medicare Other | Source: Ambulatory Visit | Attending: Urology | Admitting: Urology

## 2018-10-28 ENCOUNTER — Encounter (HOSPITAL_COMMUNITY): Payer: Self-pay

## 2018-10-28 ENCOUNTER — Other Ambulatory Visit: Payer: Self-pay

## 2018-10-28 DIAGNOSIS — Z87891 Personal history of nicotine dependence: Secondary | ICD-10-CM | POA: Diagnosis not present

## 2018-10-28 DIAGNOSIS — D751 Secondary polycythemia: Secondary | ICD-10-CM | POA: Diagnosis not present

## 2018-10-28 DIAGNOSIS — C641 Malignant neoplasm of right kidney, except renal pelvis: Secondary | ICD-10-CM | POA: Diagnosis not present

## 2018-10-28 DIAGNOSIS — Z8546 Personal history of malignant neoplasm of prostate: Secondary | ICD-10-CM | POA: Diagnosis not present

## 2018-10-28 DIAGNOSIS — Z885 Allergy status to narcotic agent status: Secondary | ICD-10-CM | POA: Diagnosis not present

## 2018-10-28 DIAGNOSIS — J449 Chronic obstructive pulmonary disease, unspecified: Secondary | ICD-10-CM | POA: Diagnosis not present

## 2018-10-28 DIAGNOSIS — F419 Anxiety disorder, unspecified: Secondary | ICD-10-CM | POA: Diagnosis not present

## 2018-10-28 DIAGNOSIS — I10 Essential (primary) hypertension: Secondary | ICD-10-CM | POA: Diagnosis not present

## 2018-10-28 DIAGNOSIS — Z905 Acquired absence of kidney: Secondary | ICD-10-CM | POA: Diagnosis not present

## 2018-10-28 DIAGNOSIS — H9193 Unspecified hearing loss, bilateral: Secondary | ICD-10-CM | POA: Diagnosis not present

## 2018-10-28 DIAGNOSIS — Z8249 Family history of ischemic heart disease and other diseases of the circulatory system: Secondary | ICD-10-CM | POA: Diagnosis not present

## 2018-10-28 DIAGNOSIS — N448 Other noninflammatory disorders of the testis: Secondary | ICD-10-CM | POA: Diagnosis present

## 2018-10-28 DIAGNOSIS — Z01812 Encounter for preprocedural laboratory examination: Secondary | ICD-10-CM | POA: Insufficient documentation

## 2018-10-28 DIAGNOSIS — Z881 Allergy status to other antibiotic agents status: Secondary | ICD-10-CM | POA: Diagnosis not present

## 2018-10-28 DIAGNOSIS — M1990 Unspecified osteoarthritis, unspecified site: Secondary | ICD-10-CM | POA: Diagnosis not present

## 2018-10-28 DIAGNOSIS — C6211 Malignant neoplasm of descended right testis: Secondary | ICD-10-CM | POA: Insufficient documentation

## 2018-10-28 DIAGNOSIS — Z882 Allergy status to sulfonamides status: Secondary | ICD-10-CM | POA: Diagnosis not present

## 2018-10-28 DIAGNOSIS — C7982 Secondary malignant neoplasm of genital organs: Secondary | ICD-10-CM | POA: Diagnosis not present

## 2018-10-28 DIAGNOSIS — Z79899 Other long term (current) drug therapy: Secondary | ICD-10-CM | POA: Diagnosis not present

## 2018-10-28 LAB — BASIC METABOLIC PANEL
Anion gap: 10 (ref 5–15)
BUN: 21 mg/dL (ref 8–23)
CO2: 25 mmol/L (ref 22–32)
Calcium: 9.7 mg/dL (ref 8.9–10.3)
Chloride: 102 mmol/L (ref 98–111)
Creatinine, Ser: 1.3 mg/dL — ABNORMAL HIGH (ref 0.61–1.24)
GFR calc Af Amer: >60 mL/min (ref >60)
GFR calc non Af Amer: 55 mL/min — ABNORMAL LOW (ref >60)
Glucose, Bld: 108 mg/dL — ABNORMAL HIGH (ref 70–99)
Potassium: 4 mmol/L (ref 3.5–5.1)
Sodium: 137 mmol/L (ref 135–145)

## 2018-10-28 LAB — CBC
HCT: 56 % — ABNORMAL HIGH (ref 39.0–52.0)
Hemoglobin: 17.7 g/dL — ABNORMAL HIGH (ref 13.0–17.0)
MCH: 25.8 pg — ABNORMAL LOW (ref 26.0–34.0)
MCHC: 31.6 g/dL (ref 30.0–36.0)
MCV: 81.5 fL (ref 80.0–100.0)
Platelets: 527 10*3/uL — ABNORMAL HIGH (ref 150–400)
RBC: 6.87 MIL/uL — ABNORMAL HIGH (ref 4.22–5.81)
RDW: 13.8 % (ref 11.5–15.5)
WBC: 10.1 10*3/uL (ref 4.0–10.5)
nRBC: 0 % (ref 0.0–0.2)

## 2018-10-28 NOTE — Progress Notes (Signed)
06-13-18 (Epic) EKG

## 2018-10-28 NOTE — Patient Instructions (Addendum)
YOU HAVE HAD A COVID 19 TEST .  PLEASE BEGIN THE QUARANTINE INSTRUCTIONS AS OUTLINED IN YOUR HANDOUT.                [name]  10/28/2018   Your procedure is scheduled on: 10-29-18    Report to [facility]     Report to [facility] [place] and have them call PACU at [place]. NO VISITORS ARE ALLOWED IN SHORT STAY OR RECOVERY ROOM.   Call this number if you have problems the morning of surgery [phone]    Remember: Do not eat food or drink liquids :After Midnight.      Take these medicines the morning of surgery with A SIP OF WATER: Buspirone (Buspar), prn. You may also use and bring your nasal spray.   BRUSH YOUR TEETH MORNING OF SURGERY AND RINSE YOUR MOUTH OUT, NO CHEWING GUM CANDY OR MINTS.                                You may not have any metal on your body including hair pins and              piercings    Do not wear jewelry, cologne, lotions, powders or deodorant                    Men may shave face and neck.   Do not bring valuables to the hospital. [place].  Contacts, dentures or bridgework may not be worn into surgery.                  Please read over the following fact sheets you were given: _____________________________________________________________________             [facility] - Preparing for Surgery Before surgery, you can play an important role.  Because skin is not sterile, your skin needs to be as free of germs as possible.  You can reduce the number of germs on your skin by washing with CHG (chlorahexidine gluconate) soap before surgery.  CHG is an antiseptic cleaner which kills germs and bonds with the skin to continue killing germs even after washing. Please DO NOT use if you have an allergy to CHG or antibacterial soaps.  If your skin becomes reddened/irritated stop using the CHG and inform your nurse when  you arrive at Short Stay. Do not shave (including legs and underarms) for at least 48 hours prior to the first CHG shower.  You may shave your face/neck. Please follow these instructions carefully:  1.  Shower with CHG Soap the night before surgery and the  morning of Surgery.  2.  If you choose to wash your hair, wash your hair first as usual with your  normal  shampoo.  3.  After you shampoo, rinse your hair and body thoroughly to remove the  shampoo.                           4.  Use CHG as you would any other liquid soap.  You can apply chg directly  to the skin and wash  Gently with a scrungie or clean washcloth.  5.  Apply the CHG Soap to your body ONLY FROM THE NECK DOWN.   Do not use on face/ open                           Wound or open sores. Avoid contact with eyes, ears mouth and genitals (private parts).                       Wash face,  Genitals (private parts) with your normal soap.             6.  Wash thoroughly, paying special attention to the area where your surgery  will be performed.  7.  Thoroughly rinse your body with warm water from the neck down.  8.  DO NOT shower/wash with your normal soap after using and rinsing off  the CHG Soap.                9.  Pat yourself dry with a clean towel.            10.  Wear clean pajamas.            11.  Place clean sheets on your bed the night of your first shower and do not  sleep with pets. Day of Surgery : Do not apply any lotions/deodorants the morning of surgery.  Please wear clean clothes to the hospital/surgery center.  FAILURE TO FOLLOW THESE INSTRUCTIONS MAY RESULT IN THE CANCELLATION OF YOUR SURGERY PATIENT SIGNATURE_________________________________  NURSE SIGNATURE__________________________________  ________________________________________________________________________

## 2018-10-28 NOTE — Progress Notes (Signed)
Per Guerry Bruin, RN pt to report to the Emergency Department Admitting at 6:00 AM in lieu of 5:30 AM for his surgical procedure with Dr. Carrie Mew on 10-29-18. Pt's wife notified and verbalized understanding.

## 2018-10-29 ENCOUNTER — Encounter (HOSPITAL_COMMUNITY): Payer: Self-pay | Admitting: *Deleted

## 2018-10-29 ENCOUNTER — Ambulatory Visit (HOSPITAL_COMMUNITY): Payer: Medicare Other | Admitting: Anesthesiology

## 2018-10-29 ENCOUNTER — Observation Stay (HOSPITAL_COMMUNITY)
Admission: RE | Admit: 2018-10-29 | Discharge: 2018-10-30 | Disposition: A | Discharge status: Home / Self Care | Payer: Medicare Other | Attending: Urology | Admitting: Urology

## 2018-10-29 ENCOUNTER — Encounter (HOSPITAL_COMMUNITY)
Admission: RE | Disposition: A | Discharge status: Home / Self Care | Payer: Self-pay | Source: Home / Self Care | Attending: Urology

## 2018-10-29 DIAGNOSIS — Z881 Allergy status to other antibiotic agents status: Secondary | ICD-10-CM | POA: Diagnosis not present

## 2018-10-29 DIAGNOSIS — Z882 Allergy status to sulfonamides status: Secondary | ICD-10-CM | POA: Diagnosis not present

## 2018-10-29 DIAGNOSIS — Z905 Acquired absence of kidney: Secondary | ICD-10-CM | POA: Diagnosis not present

## 2018-10-29 DIAGNOSIS — Z8249 Family history of ischemic heart disease and other diseases of the circulatory system: Secondary | ICD-10-CM | POA: Insufficient documentation

## 2018-10-29 DIAGNOSIS — Z79899 Other long term (current) drug therapy: Secondary | ICD-10-CM | POA: Insufficient documentation

## 2018-10-29 DIAGNOSIS — I10 Essential (primary) hypertension: Secondary | ICD-10-CM | POA: Diagnosis not present

## 2018-10-29 DIAGNOSIS — J449 Chronic obstructive pulmonary disease, unspecified: Secondary | ICD-10-CM | POA: Diagnosis not present

## 2018-10-29 DIAGNOSIS — D751 Secondary polycythemia: Secondary | ICD-10-CM | POA: Insufficient documentation

## 2018-10-29 DIAGNOSIS — C641 Malignant neoplasm of right kidney, except renal pelvis: Secondary | ICD-10-CM | POA: Diagnosis not present

## 2018-10-29 DIAGNOSIS — H9193 Unspecified hearing loss, bilateral: Secondary | ICD-10-CM | POA: Diagnosis not present

## 2018-10-29 DIAGNOSIS — Z87891 Personal history of nicotine dependence: Secondary | ICD-10-CM | POA: Diagnosis not present

## 2018-10-29 DIAGNOSIS — C7982 Secondary malignant neoplasm of genital organs: Secondary | ICD-10-CM | POA: Insufficient documentation

## 2018-10-29 DIAGNOSIS — D4959 Neoplasm of unspecified behavior of other genitourinary organ: Secondary | ICD-10-CM | POA: Diagnosis present

## 2018-10-29 DIAGNOSIS — F419 Anxiety disorder, unspecified: Secondary | ICD-10-CM | POA: Diagnosis not present

## 2018-10-29 DIAGNOSIS — Z8546 Personal history of malignant neoplasm of prostate: Secondary | ICD-10-CM | POA: Diagnosis not present

## 2018-10-29 DIAGNOSIS — M1990 Unspecified osteoarthritis, unspecified site: Secondary | ICD-10-CM | POA: Diagnosis not present

## 2018-10-29 DIAGNOSIS — C649 Malignant neoplasm of unspecified kidney, except renal pelvis: Secondary | ICD-10-CM | POA: Diagnosis not present

## 2018-10-29 DIAGNOSIS — C6291 Malignant neoplasm of right testis, unspecified whether descended or undescended: Secondary | ICD-10-CM | POA: Diagnosis not present

## 2018-10-29 DIAGNOSIS — Z885 Allergy status to narcotic agent status: Secondary | ICD-10-CM | POA: Insufficient documentation

## 2018-10-29 HISTORY — PX: ORCHIECTOMY: SHX2116

## 2018-10-29 SURGERY — ORCHIECTOMY
Anesthesia: General | Site: Scrotum | Laterality: Right

## 2018-10-29 MED ORDER — 0.9 % SODIUM CHLORIDE (POUR BTL) OPTIME
TOPICAL | Status: DC | PRN
  Administered 2018-10-29: 1000 mL

## 2018-10-29 MED ORDER — LACTATED RINGERS IV SOLN
INTRAVENOUS | Status: DC
  Administered 2018-10-29: 07:00:00 via INTRAVENOUS

## 2018-10-29 MED ORDER — ACETAMINOPHEN 10 MG/ML IV SOLN
1000.0000 mg | Freq: Once | INTRAVENOUS | Status: AC
  Administered 2018-10-29: 1000 mg via INTRAVENOUS

## 2018-10-29 MED ORDER — ONDANSETRON HCL 4 MG/2ML IJ SOLN
INTRAMUSCULAR | Status: DC | PRN
  Administered 2018-10-29: 4 mg via INTRAVENOUS

## 2018-10-29 MED ORDER — BUSPIRONE HCL 5 MG PO TABS: 5.0000 mg | ORAL_TABLET | Freq: Two times a day (BID) | ORAL | Status: DC | PRN

## 2018-10-29 MED ORDER — CLINDAMYCIN PHOSPHATE 900 MG/50ML IV SOLN
900.0000 mg | INTRAVENOUS | Status: AC
  Administered 2018-10-29: 900 mg via INTRAVENOUS

## 2018-10-29 MED ORDER — FENTANYL CITRATE (PF) 100 MCG/2ML IJ SOLN
INTRAMUSCULAR | Status: AC
  Filled 2018-10-29: qty 2

## 2018-10-29 MED ORDER — CLINDAMYCIN PHOSPHATE 900 MG/50ML IV SOLN
INTRAVENOUS | Status: AC
  Filled 2018-10-29: qty 50

## 2018-10-29 MED ORDER — EPHEDRINE 5 MG/ML INJ
INTRAVENOUS | Status: AC
  Filled 2018-10-29: qty 10

## 2018-10-29 MED ORDER — PROPOFOL 10 MG/ML IV BOLUS
INTRAVENOUS | Status: AC
  Filled 2018-10-29: qty 20

## 2018-10-29 MED ORDER — EPHEDRINE SULFATE 50 MG/ML IJ SOLN
INTRAMUSCULAR | Status: DC | PRN
  Administered 2018-10-29 (×2): 10 mg via INTRAVENOUS

## 2018-10-29 MED ORDER — MEPERIDINE HCL 50 MG/ML IJ SOLN: 6.2500 mg | INTRAMUSCULAR | Status: DC | PRN

## 2018-10-29 MED ORDER — ACETAMINOPHEN 500 MG PO TABS
1000.0000 mg | ORAL_TABLET | Freq: Three times a day (TID) | ORAL | Status: AC
  Administered 2018-10-29 – 2018-10-30 (×3): 1000 mg via ORAL
  Filled 2018-10-29 (×3): qty 2

## 2018-10-29 MED ORDER — LIDOCAINE 2% (20 MG/ML) 5 ML SYRINGE
INTRAMUSCULAR | Status: AC
  Filled 2018-10-29: qty 5

## 2018-10-29 MED ORDER — LOSARTAN POTASSIUM-HCTZ 100-25 MG PO TABS: 1.0000 | ORAL_TABLET | Freq: Every day | ORAL | Status: DC

## 2018-10-29 MED ORDER — PROPOFOL 10 MG/ML IV BOLUS
INTRAVENOUS | Status: DC | PRN
  Administered 2018-10-29: 170 mg via INTRAVENOUS

## 2018-10-29 MED ORDER — BUPIVACAINE-EPINEPHRINE (PF) 0.25% -1:200000 IJ SOLN
INTRAMUSCULAR | Status: AC
  Filled 2018-10-29: qty 30

## 2018-10-29 MED ORDER — BUPIVACAINE-EPINEPHRINE (PF) 0.25% -1:200000 IJ SOLN
INTRAMUSCULAR | Status: DC | PRN
  Administered 2018-10-29: 30 mL

## 2018-10-29 MED ORDER — ACETAMINOPHEN 10 MG/ML IV SOLN
INTRAVENOUS | Status: AC
  Filled 2018-10-29: qty 100

## 2018-10-29 MED ORDER — KCL IN DEXTROSE-NACL 20-5-0.45 MEQ/L-%-% IV SOLN
INTRAVENOUS | Status: DC
  Administered 2018-10-29: 17:00:00 via INTRAVENOUS
  Filled 2018-10-29: qty 1000

## 2018-10-29 MED ORDER — DEXAMETHASONE SODIUM PHOSPHATE 4 MG/ML IJ SOLN
INTRAMUSCULAR | Status: DC | PRN
  Administered 2018-10-29: 10 mg via INTRAVENOUS

## 2018-10-29 MED ORDER — HYDROCHLOROTHIAZIDE 25 MG PO TABS
25.0000 mg | ORAL_TABLET | Freq: Every day | ORAL | Status: DC
  Administered 2018-10-30: 25 mg via ORAL
  Filled 2018-10-29: qty 1

## 2018-10-29 MED ORDER — PRAVASTATIN SODIUM 20 MG PO TABS
40.0000 mg | ORAL_TABLET | Freq: Every evening | ORAL | Status: DC
  Administered 2018-10-29: 18:00:00 40 mg via ORAL
  Filled 2018-10-29: qty 2

## 2018-10-29 MED ORDER — LOSARTAN POTASSIUM 50 MG PO TABS
100.0000 mg | ORAL_TABLET | Freq: Every day | ORAL | Status: DC
  Administered 2018-10-30: 100 mg via ORAL
  Filled 2018-10-29: qty 2

## 2018-10-29 MED ORDER — SENNOSIDES-DOCUSATE SODIUM 8.6-50 MG PO TABS
1.0000 | ORAL_TABLET | Freq: Two times a day (BID) | ORAL | Status: DC
  Administered 2018-10-29 – 2018-10-30 (×2): 1 via ORAL
  Filled 2018-10-29 (×2): qty 1

## 2018-10-29 MED ORDER — FENTANYL CITRATE (PF) 100 MCG/2ML IJ SOLN
INTRAMUSCULAR | Status: DC | PRN
  Administered 2018-10-29 (×4): 50 ug via INTRAVENOUS

## 2018-10-29 MED ORDER — MIDAZOLAM HCL 5 MG/5ML IJ SOLN
INTRAMUSCULAR | Status: DC | PRN
  Administered 2018-10-29: 2 mg via INTRAVENOUS

## 2018-10-29 MED ORDER — PHENYLEPHRINE 40 MCG/ML (10ML) SYRINGE FOR IV PUSH (FOR BLOOD PRESSURE SUPPORT)
PREFILLED_SYRINGE | INTRAVENOUS | Status: AC
  Filled 2018-10-29: qty 10

## 2018-10-29 MED ORDER — LIDOCAINE HCL (CARDIAC) PF 100 MG/5ML IV SOSY
PREFILLED_SYRINGE | INTRAVENOUS | Status: DC | PRN
  Administered 2018-10-29: 100 mg via INTRAVENOUS

## 2018-10-29 MED ORDER — FENTANYL CITRATE (PF) 100 MCG/2ML IJ SOLN
25.0000 ug | INTRAMUSCULAR | Status: DC | PRN
  Administered 2018-10-29 (×3): 50 ug via INTRAVENOUS

## 2018-10-29 MED ORDER — OXYCODONE HCL 5 MG PO TABS
5.0000 mg | ORAL_TABLET | ORAL | Status: DC | PRN
  Administered 2018-10-29 – 2018-10-30 (×5): 5 mg via ORAL
  Filled 2018-10-29 (×5): qty 1

## 2018-10-29 MED ORDER — METOCLOPRAMIDE HCL 5 MG/ML IJ SOLN: 10.0000 mg | Freq: Once | INTRAMUSCULAR | Status: DC | PRN

## 2018-10-29 MED ORDER — MIDAZOLAM HCL 2 MG/2ML IJ SOLN
INTRAMUSCULAR | Status: AC
  Filled 2018-10-29: qty 2

## 2018-10-29 MED ORDER — CLINDAMYCIN PHOSPHATE 600 MG/50ML IV SOLN: 600.0000 mg | INTRAVENOUS | Status: DC

## 2018-10-29 MED ORDER — PHENYLEPHRINE HCL (PRESSORS) 10 MG/ML IV SOLN
INTRAVENOUS | Status: AC
  Filled 2018-10-29: qty 1

## 2018-10-29 MED ORDER — HYDROMORPHONE HCL 1 MG/ML IJ SOLN
0.5000 mg | INTRAMUSCULAR | Status: DC | PRN
  Administered 2018-10-29 – 2018-10-30 (×5): 1 mg via INTRAVENOUS
  Filled 2018-10-29 (×5): qty 1

## 2018-10-29 SURGICAL SUPPLY — 35 items
ADH SKN CLS APL DERMABOND .7 (GAUZE/BANDAGES/DRESSINGS) ×1
APL SKNCLS STERI-STRIP NONHPOA (GAUZE/BANDAGES/DRESSINGS)
BENZOIN TINCTURE PRP APPL 2/3 (GAUZE/BANDAGES/DRESSINGS) ×1 IMPLANT
BLADE HEX COATED 2.75 (ELECTRODE) ×3 IMPLANT
BLADE SURG 15 STRL LF DISP TIS (BLADE) ×1 IMPLANT
BLADE SURG 15 STRL SS (BLADE) ×3
BNDG GAUZE ELAST 4 BULKY (GAUZE/BANDAGES/DRESSINGS) ×3 IMPLANT
COVER WAND RF STERILE (DRAPES) IMPLANT
DERMABOND ADVANCED (GAUZE/BANDAGES/DRESSINGS) ×2
DERMABOND ADVANCED .7 DNX12 (GAUZE/BANDAGES/DRESSINGS) ×1 IMPLANT
DRAIN PENROSE 18X1/2 LTX STRL (DRAIN) ×1 IMPLANT
DRAIN PENROSE 18X1/4 LTX STRL (WOUND CARE) ×3 IMPLANT
DRAPE LAPAROTOMY T 98X78 PEDS (DRAPES) ×3 IMPLANT
ELECT PENCIL ROCKER SW 15FT (MISCELLANEOUS) ×3 IMPLANT
ELECT REM PT RETURN 15FT ADLT (MISCELLANEOUS) ×3 IMPLANT
GAUZE SPONGE 4X4 12PLY STRL (GAUZE/BANDAGES/DRESSINGS) ×3 IMPLANT
GLOVE BIOGEL M STRL SZ7.5 (GLOVE) ×3 IMPLANT
GOWN STRL REUS W/TWL LRG LVL3 (GOWN DISPOSABLE) ×6 IMPLANT
KIT BASIN OR (CUSTOM PROCEDURE TRAY) ×3 IMPLANT
KIT TURNOVER KIT A (KITS) IMPLANT
NEEDLE HYPO 22GX1.5 SAFETY (NEEDLE) ×2 IMPLANT
NS IRRIG 1000ML POUR BTL (IV SOLUTION) ×2 IMPLANT
PACK BASIC VI WITH GOWN DISP (CUSTOM PROCEDURE TRAY) ×3 IMPLANT
SPONGE LAP 4X18 RFD (DISPOSABLE) ×6 IMPLANT
SUPPORT SCROTAL LG STRP (MISCELLANEOUS) ×1 IMPLANT
SUPPORTER ATHLETIC LG (MISCELLANEOUS)
SUT ETHILON 3 0 PS 1 (SUTURE) ×2 IMPLANT
SUT MNCRL AB 4-0 PS2 18 (SUTURE) ×3 IMPLANT
SUT SILK 0 (SUTURE) ×3
SUT SILK 0 30XBRD TIE 6 (SUTURE) ×1 IMPLANT
SUT VIC AB 3-0 SH 27 (SUTURE) ×3
SUT VIC AB 3-0 SH 27XBRD (SUTURE) ×1 IMPLANT
SYR 20ML LL LF (SYRINGE) ×3 IMPLANT
SYR CONTROL 10ML LL (SYRINGE) ×2 IMPLANT
WATER STERILE IRR 1000ML POUR (IV SOLUTION) IMPLANT

## 2018-10-29 NOTE — Anesthesia Procedure Notes (Signed)
Procedure Name: LMA Insertion Date/Time: 10/29/2018 8:14 AM Performed by: Lavina Hamman, CRNA Pre-anesthesia Checklist: Patient identified, Emergency Drugs available, Suction available and Patient being monitored Patient Re-evaluated:Patient Re-evaluated prior to induction Oxygen Delivery Method: Circle System Utilized Preoxygenation: Pre-oxygenation with 100% oxygen Induction Type: IV induction Ventilation: Mask ventilation without difficulty LMA: LMA inserted LMA Size: 4.0 Number of attempts: 1 Airway Equipment and Method: Bite block Placement Confirmation: positive ETCO2 Tube secured with: Tape Dental Injury: Teeth and Oropharynx as per pre-operative assessment

## 2018-10-29 NOTE — H&P (Signed)
[name] is an 73 y.o. male.    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 Mass 12cm at resection. Pre-op staging CT chest/abd localized. Contralateral kidney anatomically normal.   Post-Op Surveillance:  - 09/2018 - CT abd (no pelvis) w/o recurrent cancer. Stable non-complex left renal cyst.   2 - Prostate Cancer - s/p primary brachytherapy 2006 by Rosana Hoes. PSA 2019 0.04 consistent with biochemical cure.   3 - Solitary Left Kidney - s/p right nephrectomy for large cancer 2020. Post op Cr's <1.3.   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 Severe orchitis - subacute onsent of pain x 7 days of Rt groin / testis area. Exam c/w likely orchitis with swollen Rt testis 09/2018. NO fevers. He is taking tramadol low dose. No improvement with empiric orchitis treatment and exam with worsening swelling, no GI sympotms. Some concern for neoplasm. CT 8/13 with continued progression of Rt inguinal-scrotal process and enhancing gonadal vein stump with some nodularity concerning for possible neoplasm.   PMH sig for left inguinal hernia repair (open), HTN, blood dyscrasia (follows [facility] with periodic phlebotomy, stable x years), No ischemic CV disease / blood thinners. . No ischemic CV disease / blood thinners. His PCP is Janie Morning DO with [facility].   Today "[name]" is seen to proceed with RIGHT ingiunal orchiectomy for tissue diagnosis for Rt testis / groin process (severe infection v. Neoplasm). No inerval fevers.      Past Medical History:  Diagnosis Date  . Anxiety   . Arthritis   . Diverticulitis   . Hypertension   . Inguinal hernia   . Polycythemia    phlebotomy 06/15/13  . Prostate cancer ([place])   . Wears glasses   . Wears hearing aid    both ears    Past Surgical History:  Procedure Laterality Date  . COLONOSCOPY    . DUPUYTREN  CONTRACTURE RELEASE Left 06/22/2013   Procedure: EXCISION LEFT DUPUYTRENS RING AND SMALL FINGERS;  Surgeon: Cammie Sickle., MD;  Location: [place];  Service: Orthopedics;  Laterality: Left;  . ESOPHAGOGASTRODUODENOSCOPY (EGD) WITH PROPOFOL N/A 05/12/2018   Procedure: ESOPHAGOGASTRODUODENOSCOPY (EGD) WITH PROPOFOL;  Surgeon: Laurence Spates, MD;  Location: [facility];  Service: Endoscopy;  Laterality: N/A;  . HERNIA REPAIR  2010   lt ing with lt hydrocele  . RADIOACTIVE SEED IMPLANT  2006   hx of prostate  . ROBOT ASSISTED LAPAROSCOPIC NEPHRECTOMY Right 06/22/2018   Procedure: XI ROBOTIC ASSISTED LAPAROSCOPIC NEPHRECTOMY;  Surgeon: Alexis Frock, MD;  Location: [facility];  Service: Urology;  Laterality: Right;  3 HRS  . SHOULDER ARTHROSCOPY  11/13   right    Family History  Problem Relation Age of Onset  . Heart attack Father   . Hypertension Father    Social History:  reports that he quit smoking about 38 years ago. He has never used smokeless tobacco. He reports that he does not drink alcohol or use drugs.  Allergies:  Allergies  Allergen Reactions  . Oxycodone Other (See Comments)    Patient stated,"it keeps me wide awake and I can't sleep."  . Septra [Sulfamethoxazole-Trimethoprim] Hives and Itching    No medications prior to admission.    Results for orders placed or performed during the hospital encounter of 10/28/18 (from the past 48 hour(s))  Basic metabolic panel  Status: Abnormal   Collection Time: 10/28/18 11:47 AM  Result Value Ref Range   Sodium 137 135 - 145 mmol/L   Potassium 4.0 3.5 - 5.1 mmol/L   Chloride 102 98 - 111 mmol/L   CO2 25 22 - 32 mmol/L   Glucose, Bld 108 (H) 70 - 99 mg/dL   BUN 21 8 - 23 mg/dL   Creatinine, Ser 1.30 (H) 0.61 - 1.24 mg/dL   Calcium 9.7 8.9 - 10.3 mg/dL   GFR calc non Af Amer 55 (L) >60 mL/min   GFR calc Af Amer >60 >60 mL/min   Anion gap 10 5 - 15    Comment: Performed at [facility],  [place] [address], [place], [place] [address]  CBC     Status: Abnormal   Collection Time: 10/28/18 11:47 AM  Result Value Ref Range   WBC 10.1 4.0 - 10.5 K/uL   RBC 6.87 (H) 4.22 - 5.81 MIL/uL   Hemoglobin 17.7 (H) 13.0 - 17.0 g/dL   HCT 56.0 (H) 39.0 - 52.0 %   MCV 81.5 80.0 - 100.0 fL   MCH 25.8 (L) 26.0 - 34.0 pg   MCHC 31.6 30.0 - 36.0 g/dL   RDW 13.8 11.5 - 15.5 %   Platelets 527 (H) 150 - 400 K/uL   nRBC 0.0 0.0 - 0.2 %    Comment: Performed at [facility], [place] [address], [place], [place] [address]   No results found.  Review of Systems  Constitutional: Negative for chills and fever.  HENT: Negative.   Eyes: Negative.   Respiratory: Negative.   Cardiovascular: Negative.   Gastrointestinal: Negative.   Genitourinary: Negative.   Musculoskeletal: Negative.   Skin: Negative.   Neurological: Negative.   Endo/Heme/Allergies: Negative.   Psychiatric/Behavioral: Negative.     There were no vitals taken for this visit. Physical Exam  Constitutional: He appears well-developed.  HENT:  Head: Normocephalic.  Neck: Normal range of motion.  Cardiovascular: Normal rate.  Respiratory: Effort normal.  GI:  Prior scars w/o hernias.   Genitourinary:    Genitourinary Comments: Progressive Rt inguinal / scrotal firm swelling w/o redness / fluctuence.   Musculoskeletal: Normal range of motion.  Neurological: He is alert.  Skin: Skin is warm.  Psychiatric: He has a normal mood and affect.     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 Severe orchitis. Risks, benefits, alternatives, expected peri-op course discussed previously and reiterated today.   Alexis Frock, MD 10/29/2018, 6:15 AM

## 2018-10-29 NOTE — Brief Op Note (Signed)
10/29/2018  8:54 AM  PATIENT:  [name]  73 y.o. male  PRE-OPERATIVE DIAGNOSIS:  RIGHT TESTICULAR NEOPLASM  POST-OPERATIVE DIAGNOSIS:  right testicular neoplasm  PROCEDURE:  Procedure(s) with comments: ORCHIECTOMY radical (Right) - 75 MINS  SURGEON:  Surgeon(s) and Role:    * Alexis Frock, MD - Primary  PHYSICIAN ASSISTANT:   ASSISTANTS: none   ANESTHESIA:   general  EBL:  20 mL   BLOOD ADMINISTERED:none  DRAINS: penrose in dependant scrotum   LOCAL MEDICATIONS USED:  MARCAINE     SPECIMEN:  Source of Specimen:  Rt radical orchiectomy  DISPOSITION OF SPECIMEN:  PATHOLOGY  COUNTS:  YES  TOURNIQUET:  * No tourniquets in log *  DICTATION: .Other Dictation: Dictation Number  [id number]  PLAN OF CARE: Admit for overnight observation  PATIENT DISPOSITION:  PACU - hemodynamically stable.   Delay start of Pharmacological VTE agent (>24hrs) due to surgical blood loss or risk of bleeding: yes

## 2018-10-29 NOTE — Anesthesia Postprocedure Evaluation (Signed)
Anesthesia Post Note  Patient: [name]  Procedure(s) Performed: ORCHIECTOMY radical (Right Scrotum)     Patient location during evaluation: PACU Anesthesia Type: General Level of consciousness: awake and alert Pain management: pain level controlled Vital Signs Assessment: post-procedure vital signs reviewed and stable Respiratory status: spontaneous breathing, nonlabored ventilation, respiratory function stable and patient connected to nasal cannula oxygen Cardiovascular status: blood pressure returned to baseline and stable Postop Assessment: no apparent nausea or vomiting Anesthetic complications: no    Last Vitals:  Vitals:   10/29/18 1051 10/29/18 1158  BP: 138/85 135/82  Pulse: 98 (!) 105  Resp: 18   Temp: 36.8 C 36.7 C  SpO2: 97% 96%    Last Pain:  Vitals:   10/29/18 1158  TempSrc: Oral  PainSc:                  [name]

## 2018-10-29 NOTE — Op Note (Signed)
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  PROCEDURE:  Open right radical orchiectomy.  ESTIMATED BLOOD LOSS:  20 mL.  COMPLICATIONS:  None.  SPECIMEN:  Right radical orchiectomy.  FINDINGS:  Large, firm, right testis and very thickened cord.  No evidence of purulence or gross infection.  No evidence of invasion beyond testicle.  DRAINS:  Penrose drain to scrotal drainage.  INDICATIONS:  The patient is a very pleasant 73 year old man with a complex oncologic history.  He has a history of prostate cancer status post brachytherapy years ago.  He has had great biochemical control of that.  He then had a right radical  nephrectomy earlier this year for a very large and aggressive right kidney cancer.  He did very well initially perioperatively.  He then complained of progressive right scrotal swelling, initially thought to be a varicocele.  Serial imaging and exam  revealed worsening of his right inguinal scrotal swelling with concern for orchitis; however, he had no infectious parameters, and empiric treatment did not improve his pain or swelling.  Axial imaging ruled out hernia but did reveal an enhancing nodule  gonadal vein and heterogeneous enhancing right testicle.  This overall constellation of symptoms and presentation was concerning for possible neoplastic seeding of his right testicle from his prior right renal cancer via the gonadal blood supply versus  unusual course infection.  Options were discussed including biopsy versus continued care versus aggressive therapy with right radical inguinal orchiectomy with goal of symptom palliation and tissue diagnosis, and he wished to proceed with the latter.   Informed  consent was obtained and placed in the medical record.  PROCEDURE IN DETAIL:  The patient being identified, the procedure being right radical orchiectomy was performed.  Procedure timeout was performed.  IV antibiotics were administered.  General anesthesia was induced.  The patient was placed in supine  position.  A sterile field was created by first clipper shaving then prepping and draping the patient's right inguinal area, penis, perineum, and scrotal area with iodine.  A curvilinear incision was made beginning approximately 1 fingerbreadth  superolateral to the superior penis extending superolaterally.  The incision was carried down through skin and subcutaneous fatty tissue to the level of the cord.  The cord was very thickened as anticipated.  It was mobilized and controlled with a  1/4-inch Penrose drain.  Dissection proceeded distally towards the area of the scrotum, and testicle was again quite enlarged and firm.  There was no obvious purulence.  There was no obvious local scrotal invasion.  The testicle was delivered via the  inguinal incision.  The thickened cord was divided into 3 segments, each of which was doubly tied with a silk tie, and then the whole cord being tied again more proximally with a silk tie and the cord transected.  This resulted in excellent cord  hemostasis, and the right radical orchiectomy specimen was set aside for permanent pathology.  Again, given that this procedure is not concerning for primary germ cell tumor but rather metastatic tumor of the testis and large nature of the testis, it was  felt that it would be safe to place a Penrose drain to help prevent hematoma formation.  As such, a small counter incision was made in the dependent scrotum through which a 1/4-inch  Penrose drain was placed and anchored with a nylon stitch.  Hemostasis  appeared excellent.  Sponge and needle counts were correct.  Deep dermal layers were closed using 2-0 Vicryl, taking exquisite  care to avoid suturing the drain, and 20 mL of dilute Marcaine with epinephrine was instilled into the wound and the skin  edges, and the skin was reapproximated using subcuticular Monocryl and Dermabond.  Procedure was terminated.  The patient tolerated the procedure well.  No immediate perioperative complications.  The patient was taken to postanesthesia care unit in  stable condition with plan for observation overnight.  LN/NUANCE  D:10/29/2018 T:10/29/2018 JOB:[id number]

## 2018-10-29 NOTE — Transfer of Care (Signed)
Immediate Anesthesia Transfer of Care Note  Patient: STEELE Reyes  Procedure(s) Performed: Procedure(s) with comments: ORCHIECTOMY radical (Right) - 58 MINS  Patient Location: PACU  Anesthesia Type:General  Level of Consciousness:  sedated, patient cooperative and responds to stimulation  Airway & Oxygen Therapy:Patient Spontanous Breathing and Patient connected to face mask oxgen  Post-op Assessment:  Report given to PACU RN and Post -op Vital signs reviewed and stable  Post vital signs:  Reviewed and stable  Last Vitals:  Vitals:   10/29/18 0619  BP: 126/85  Pulse: 72  Resp: 14  Temp: 36.7 C  SpO2: 94%    Complications: No apparent anesthesia complications

## 2018-10-29 NOTE — Anesthesia Preprocedure Evaluation (Signed)
Anesthesia Evaluation  Patient identified by MRN, date of birth, ID band Patient awake    Reviewed: Allergy & Precautions, NPO status , Patient's Chart, lab work & pertinent test results  History of Anesthesia Complications Negative for: history of anesthetic complications  Airway Mallampati: III  TM Distance: >3 FB Neck ROM: Full    Dental  (+) Dental Advisory Given, Teeth Intact   Pulmonary COPD, former smoker,    breath sounds clear to auscultation       Cardiovascular Exercise Tolerance: Good hypertension, Pt. on medications (-) angina Rhythm:Regular Rate:Normal     Neuro/Psych PSYCHIATRIC DISORDERS Anxiety negative neurological ROS     GI/Hepatic negative GI ROS, Neg liver ROS,   Endo/Other  negative endocrine ROS  Renal/GU  Renal cancer      Musculoskeletal  (+) Arthritis ,   Abdominal   Peds  Hematology  Polycythemia treated with periodic phlebotomy    Anesthesia Other Findings   Reproductive/Obstetrics                             Anesthesia Physical  Anesthesia Plan  ASA: III  Anesthesia Plan: General   Post-op Pain Management:    Induction: Intravenous  PONV Risk Score and Plan: 4 or greater and Treatment may vary due to age or medical condition, Ondansetron and Dexamethasone  Airway Management Planned: LMA  Additional Equipment: None  Intra-op Plan:   Post-operative Plan:   Informed Consent: I have reviewed the patients History and Physical, chart, labs and discussed the procedure including the risks, benefits and alternatives for the proposed anesthesia with the patient or authorized representative who has indicated his/her understanding and acceptance.     Dental advisory given  Plan Discussed with: CRNA and Anesthesiologist  Anesthesia Plan Comments:         Anesthesia Quick Evaluation

## 2018-10-30 ENCOUNTER — Encounter (HOSPITAL_COMMUNITY): Payer: Self-pay | Admitting: Urology

## 2018-10-30 DIAGNOSIS — Z905 Acquired absence of kidney: Secondary | ICD-10-CM | POA: Diagnosis not present

## 2018-10-30 DIAGNOSIS — C641 Malignant neoplasm of right kidney, except renal pelvis: Secondary | ICD-10-CM | POA: Diagnosis not present

## 2018-10-30 DIAGNOSIS — C7982 Secondary malignant neoplasm of genital organs: Secondary | ICD-10-CM | POA: Diagnosis not present

## 2018-10-30 DIAGNOSIS — Z8546 Personal history of malignant neoplasm of prostate: Secondary | ICD-10-CM | POA: Diagnosis not present

## 2018-10-30 DIAGNOSIS — I10 Essential (primary) hypertension: Secondary | ICD-10-CM | POA: Diagnosis not present

## 2018-10-30 DIAGNOSIS — F419 Anxiety disorder, unspecified: Secondary | ICD-10-CM | POA: Diagnosis not present

## 2018-10-30 MED ORDER — OXYCODONE-ACETAMINOPHEN 5-325 MG PO TABS: 1.0000 | ORAL_TABLET | Freq: Three times a day (TID) | ORAL | 0 refills | Status: AC | PRN

## 2018-10-30 NOTE — Discharge Summary (Signed)
Physician Discharge Summary  Patient ID: [name] MRN: [id number] DOB/AGE: 1945/05/26 73 y.o.  Admit date: 10/29/2018 Discharge date: 10/30/2018  Admission Diagnoses: Rt testicular neoplasm  Discharge Diagnoses:  Active Problems:   Testicular neoplasm   Discharged Condition: good  Hospital Course: Pt underwent Rt radical orchiectomy on 10/29/18, the day of admission, without acute complication. He was observed overnight post-op. By the AM of POD 1 he is ambulatory, pain controlled on PO meds, maintining PO nutrition and felt to be adequate for discharge. Final pathology pending at discharge. Penrose drain removed as output scant.   Consults: None  Significant Diagnostic Studies: labs: as per above  Treatments: surgery: as per above  Discharge Exam: Blood pressure 115/75, pulse 77, temperature 98 F (36.7 C), temperature source Oral, resp. rate 18, height 5\' 9"  (1.753 m), weight 76.5 kg, SpO2 96 %. General appearance: alert, cooperative and very pleasant, at baseline.  Eyes: negative Nose: Nares normal. Septum midline. Mucosa normal. No drainage or sinus tenderness. Throat: lips, mucosa, and tongue normal; teeth and gums normal Neck: supple, symmetrical, trachea midline Back: symmetric, no curvature. ROM normal. No CVA tenderness. Resp: non-labored on room air Cardio: Nl rate GI: soft, non-tender; bowel sounds normal; no masses,  no organomegaly Male genitalia: normal, rt inguinal incision area wtih mild swelling as anticiapted, no hemaotomas. Penrose site in dependant scrotum with scant serosanguinous output, no hematomas, removed and inspected / intact.  Extremities: extremities normal, atraumatic, no cyanosis or edema Lymph nodes: Cervical, supraclavicular, and axillary nodes normal. Neurologic: Grossly normal  Disposition:    Allergies as of 10/30/2018      Reactions   Oxycodone Other (See Comments)   Patient stated,"it keeps me wide awake and I can't sleep."   Septra [sulfamethoxazole-trimethoprim] Hives, Itching      Medication List    STOP taking these medications   acetaminophen 500 MG tablet Commonly known as: TYLENOL   HYDROcodone-acetaminophen 5-325 MG tablet Commonly known as: Norco   traMADol 50 MG tablet Commonly known as: ULTRAM     TAKE these medications   busPIRone 5 MG tablet Commonly known as: BUSPAR Take 5 mg by mouth 2 (two) times daily as needed (for anxiety).   fluticasone 50 MCG/ACT nasal spray Commonly known as: FLONASE Place 1 spray into both nostrils daily as needed for allergies.   losartan-hydrochlorothiazide 100-25 MG tablet Commonly known as: HYZAAR Take 1 tablet by mouth daily.   oxyCODONE-acetaminophen 5-325 MG tablet Commonly known as: Percocet Take 1-2 tablets by mouth every 8 (eight) hours as needed for moderate pain or severe pain. Post-operatively   pravastatin 40 MG tablet Commonly known as: PRAVACHOL Take 40 mg by mouth daily.      Follow-up Information    Alexis Frock, MD On 11/07/2018.   Specialty: Urology Why: at 9 AM for MD visit Contact information: [place] [address] [phone]           Signed: Alexis Frock 10/30/2018, 8:43 AM

## 2018-10-30 NOTE — Discharge Instructions (Signed)
1 -  Call MD or go to ER for fever >102, severe pain / nausea / vomiting not relieved by medications, or acute change in medical status  2-  You may have groin swelling and drainage from prior drain site x few days. This is normal. All stitches are dissolvable and will go away in about 2 weeks. You may shower immediately.

## 2018-10-30 NOTE — Care Management Obs Status (Signed)
[facility] NOTIFICATION   Patient Details  Name: [name] MRN: [id number] Date of Birth: 04-May-1945   Medicare Observation Status Notification Given:  Yes    Joaquin Courts, RN 10/30/2018, 9:30 AM

## 2018-11-07 DIAGNOSIS — C6290 Malignant neoplasm of unspecified testis, unspecified whether descended or undescended: Secondary | ICD-10-CM | POA: Diagnosis not present

## 2018-11-10 ENCOUNTER — Telehealth: Payer: Self-pay | Admitting: Oncology

## 2018-11-10 NOTE — Telephone Encounter (Signed)
Scheduled appt per 8/27 sch message- pt aware of apt date and time

## 2018-11-11 ENCOUNTER — Other Ambulatory Visit: Payer: Self-pay

## 2018-11-11 ENCOUNTER — Inpatient Hospital Stay: Payer: Medicare Other | Attending: Oncology | Admitting: Oncology

## 2018-11-11 VITALS — BP 132/85 | HR 84 | Temp 98.9°F | Resp 18 | Ht 69.0 in | Wt 174.3 lb

## 2018-11-11 DIAGNOSIS — Z9079 Acquired absence of other genital organ(s): Secondary | ICD-10-CM | POA: Diagnosis not present

## 2018-11-11 DIAGNOSIS — D751 Secondary polycythemia: Secondary | ICD-10-CM | POA: Diagnosis not present

## 2018-11-11 DIAGNOSIS — Z79899 Other long term (current) drug therapy: Secondary | ICD-10-CM | POA: Diagnosis not present

## 2018-11-11 DIAGNOSIS — C649 Malignant neoplasm of unspecified kidney, except renal pelvis: Secondary | ICD-10-CM | POA: Diagnosis not present

## 2018-11-11 DIAGNOSIS — Z905 Acquired absence of kidney: Secondary | ICD-10-CM | POA: Insufficient documentation

## 2018-11-11 NOTE — Progress Notes (Signed)
Hematology and Oncology Follow Up Visit  KORAN RUMLER [id number] 05-16-1945 73 y.o. 11/11/2018 12:49 PM [name], DO   Principle Diagnosis: 73 year old man with:  1.  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  He developed recurrent disease involving the epididymis confirmed in August 2020. 2.  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   Prior Therapy:   Therapeutic phlebotomy as needed.  He status post radical nephrectomy April 2020.  The final pathology showed clear cell histology invading into the perineural fat indicating stage T3a.  Tumor margins are negative.  He status post orchiectomy completed on 10/29/2018.  He final pathology showed metastatic carcinoma consistent with clear cell histology.  Current therapy: Phlebotomy every 6 months to keep his hematocrit less than 45.  Interim History:  Mr. Bleazard is here for a follow-up visit.  He has reported increased inguinal pain and swelling for the last few weeks.  He had a CT scan of the abdomen and pelvis on 10/06/2018 which showed no acute process at that time.  He did have a suspicious right hydrocele and varicocele on the right.  Based on these findings he was at evaluated by Dr. Tresa Moore for possible orchitis and possible right testicular neoplasm.  He underwent an orchiectomy completed on 10/29/2018 and the final pathology showed metastatic carcinoma other than a primary testis neoplasm.  Since his operation, Mr. Kalbaugh feels reasonably well at this time.  He does report some soreness but his pain has improved significantly at this time.  He still active and attends activities of daily living.  He denied any alteration mental status, neuropathy, confusion or dizziness.  Denies any headaches or lethargy.  Denies any night sweats, weight loss or changes in appetite.  Denied orthopnea, dyspnea on exertion or chest discomfort.  Denies  shortness of breath, difficulty breathing hemoptysis or cough.  Denies any abdominal distention, nausea, early satiety or dyspepsia.  Denies any hematuria, frequency, dysuria or nocturia.  Denies any skin irritation, dryness or rash.  Denies any ecchymosis or petechiae.  Denies any lymphadenopathy or clotting.  Denies any heat or cold intolerance.  Denies any anxiety or depression.  Remaining review of system is negative.          Medications: No change on review. Current Outpatient Medications  Medication Sig Dispense Refill  . busPIRone (BUSPAR) 5 MG tablet Take 5 mg by mouth 2 (two) times daily as needed (for anxiety).    . fluticasone (FLONASE) 50 MCG/ACT nasal spray Place 1 spray into both nostrils daily as needed for allergies.     Marland Kitchen losartan-hydrochlorothiazide (HYZAAR) 100-25 MG tablet Take 1 tablet by mouth daily.    Marland Kitchen oxyCODONE-acetaminophen (PERCOCET) 5-325 MG tablet Take 1-2 tablets by mouth every 8 (eight) hours as needed for moderate pain or severe pain. Post-operatively 20 tablet 0  . pravastatin (PRAVACHOL) 40 MG tablet Take 40 mg by mouth daily.     No current facility-administered medications for this visit.      Allergies:  Allergies  Allergen Reactions  . Oxycodone Other (See Comments)    Patient stated,"it keeps me wide awake and I can't sleep."  . Septra [Sulfamethoxazole-Trimethoprim] Hives and Itching    Past Medical History, Surgical history, Social history, and Family History updated without changes.  Physical exam: Blood pressure 132/85, pulse 84, temperature 98.9 F (37.2 C), temperature source Oral, resp. rate 18, height 5\' 9"  (1.753 m), weight 174 lb 4.8 oz (  79.1 kg), SpO2 98 %.    ECOG: 0    General appearance: Alert, awake without any distress. Head: Atraumatic without abnormalities Oropharynx: Without any thrush or ulcers. Eyes: No scleral icterus. Lymph nodes: No lymphadenopathy noted in the cervical, supraclavicular, or axillary  nodes Heart:regular rate and rhythm, without any murmurs or gallops.   Lung: Clear to auscultation without any rhonchi, wheezes or dullness to percussion. Abdomin: Soft, nontender without any shifting dullness or ascites. Musculoskeletal: No clubbing or cyanosis. Neurological: No motor or sensory deficits. Skin: No rashes or lesions.      Lab Results: Lab Results  Component Value Date   WBC 10.1 10/28/2018   HGB 17.7 (H) 10/28/2018   HCT 56.0 (H) 10/28/2018   MCV 81.5 10/28/2018   PLT 527 (H) 10/28/2018     Chemistry      Component Value Date/Time   NA 137 10/28/2018 1147   NA 141 01/18/2015 0835   K 4.0 10/28/2018 1147   K 4.1 01/18/2015 0835   CL 102 10/28/2018 1147   CO2 25 10/28/2018 1147   CO2 25 01/18/2015 0835   BUN 21 10/28/2018 1147   BUN 21.5 01/18/2015 0835   CREATININE 1.30 (H) 10/28/2018 1147   CREATININE 1.1 01/18/2015 0835      Component Value Date/Time   CALCIUM 9.7 10/28/2018 1147   CALCIUM 9.8 01/18/2015 0835   ALKPHOS 66 01/18/2015 0835   AST 15 01/18/2015 0835   ALT 16 01/18/2015 0835   BILITOT 0.91 01/18/2015 0835         Impression and Plan:  73 year old man with:    1.  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  He is status post nephrectomy followed by orchiectomy for tumor recurrence.   The natural course of this disease was reviewed today with the patient personally and with his wife via phone.  Treatment options were also discussed.  The fact that he had a recurrent disease into the right testicle is a rather rare occurrence but this could be considered an oligo metastatic disease that has been resected.  The role for systemic therapy at this time was discussed.  Adjuvant therapy for stage IV NED disease has not been proven to be effective at this time.  I have recommended staging him thoroughly with a PET CT scan and proceed with observation and surveillance at this time unless he has measurable disease.  If  he develops measurable disease, then systemic therapy will be utilized.   2.  Inguinal mass and pain: He is status post orchiectomy which showed metastatic disease from renal cell carcinoma.  His pain is improved at this time.  3.  Secondary polycythemia: Hemoglobin remains elevated and subsequent phlebotomy will be considered if his hemoglobin continues to be elevated.  4. Followup: In the next few weeks to follow his progress.  25 minutes was spent today face-to-face with the patient.  More than 50% of that time was spent on updating his disease status, reviewing imaging studies and discussing differential diagnosis and management options for the future.  Zola Button, MD 8/28/202012:49 PM

## 2018-11-18 ENCOUNTER — Other Ambulatory Visit: Payer: Self-pay

## 2018-11-18 ENCOUNTER — Encounter (HOSPITAL_COMMUNITY)
Admission: RE | Admit: 2018-11-18 | Discharge: 2018-11-18 | Disposition: A | Discharge status: Home / Self Care | Payer: Medicare Other | Source: Ambulatory Visit | Attending: Oncology | Admitting: Oncology

## 2018-11-18 DIAGNOSIS — C641 Malignant neoplasm of right kidney, except renal pelvis: Secondary | ICD-10-CM | POA: Insufficient documentation

## 2018-11-18 DIAGNOSIS — I700 Atherosclerosis of aorta: Secondary | ICD-10-CM | POA: Diagnosis not present

## 2018-11-18 DIAGNOSIS — C7982 Secondary malignant neoplasm of genital organs: Secondary | ICD-10-CM | POA: Diagnosis not present

## 2018-11-18 DIAGNOSIS — M799 Soft tissue disorder, unspecified: Secondary | ICD-10-CM | POA: Diagnosis not present

## 2018-11-18 DIAGNOSIS — I2510 Atherosclerotic heart disease of native coronary artery without angina pectoris: Secondary | ICD-10-CM | POA: Diagnosis not present

## 2018-11-18 DIAGNOSIS — C649 Malignant neoplasm of unspecified kidney, except renal pelvis: Secondary | ICD-10-CM | POA: Diagnosis present

## 2018-11-18 DIAGNOSIS — Z905 Acquired absence of kidney: Secondary | ICD-10-CM | POA: Diagnosis not present

## 2018-11-18 DIAGNOSIS — Z9079 Acquired absence of other genital organ(s): Secondary | ICD-10-CM | POA: Diagnosis not present

## 2018-11-18 LAB — GLUCOSE, CAPILLARY: Glucose-Capillary: 115 mg/dL — ABNORMAL HIGH (ref 70–99)

## 2018-11-18 MED ORDER — FLUDEOXYGLUCOSE F - 18 (FDG) INJECTION
8.6700 | Freq: Once | INTRAVENOUS | Status: AC | PRN
  Administered 2018-11-18: 8.67 via INTRAVENOUS

## 2018-11-22 ENCOUNTER — Telehealth: Payer: Self-pay | Admitting: Oncology

## 2018-11-22 NOTE — Telephone Encounter (Signed)
Called and spoke with patient. Confirmed 9/24 appt  

## 2018-12-08 ENCOUNTER — Other Ambulatory Visit: Payer: Self-pay

## 2018-12-08 ENCOUNTER — Inpatient Hospital Stay: Payer: Medicare Other | Attending: Oncology | Admitting: Oncology

## 2018-12-08 VITALS — BP 129/82 | HR 78 | Temp 98.2°F | Resp 18 | Ht 69.0 in | Wt 183.3 lb

## 2018-12-08 DIAGNOSIS — C649 Malignant neoplasm of unspecified kidney, except renal pelvis: Secondary | ICD-10-CM | POA: Diagnosis not present

## 2018-12-08 DIAGNOSIS — D751 Secondary polycythemia: Secondary | ICD-10-CM | POA: Insufficient documentation

## 2018-12-08 DIAGNOSIS — Z79899 Other long term (current) drug therapy: Secondary | ICD-10-CM | POA: Insufficient documentation

## 2018-12-08 DIAGNOSIS — Z905 Acquired absence of kidney: Secondary | ICD-10-CM | POA: Diagnosis not present

## 2018-12-08 DIAGNOSIS — N2889 Other specified disorders of kidney and ureter: Secondary | ICD-10-CM | POA: Diagnosis not present

## 2018-12-08 NOTE — Progress Notes (Signed)
Hematology and Oncology Follow Up Visit  [name] [id number] Jun 07, 1945 73 y.o. 12/08/2018 4:07 PM [name], DO   Principle Diagnosis: 73 year old man with:  1.  Renal cell carcinoma diagnosed in April 2020.  He developed stage IV disease in August 2020 with epididymal involvement.  He has initially clear cell histology with stage IIIa disease. 2.  Secondary polycythemia diagnosed in 2011.   Prior Therapy:   Therapeutic phlebotomy as needed.  He status post radical nephrectomy April 2020.  The final pathology showed clear cell histology invading into the perineural fat indicating stage T3a.  Tumor margins are negative.  He status post orchiectomy completed on 10/29/2018.  He final pathology showed metastatic carcinoma consistent with clear cell histology.  Current therapy:   Phlebotomy every 6 months to keep his hematocrit less than 45.  Consideration for systemic therapy for his kidney cancer.  Interim History:  Mr. Biehle is here for return evaluation.  Since of the last visit, he continues to improve from his orchiectomy and has recovered reasonably well for the most part.  He denied any increased pain in his groin or constitutional symptoms.  Denies any abdominal discomfort, weight loss or appetite changes.  His performance status and quality of life remains unchanged.  Denies any chest pain or thrombosis.  He denies any bleeding complications.  He denied headaches, blurry vision, syncope or seizures.  Denies any fevers, chills or sweats.  Denied chest pain, palpitation, orthopnea or leg edema.  Denied cough, wheezing or hemoptysis.  Denied nausea, vomiting or abdominal pain.  Denies any constipation or diarrhea.  Denies any frequency urgency or hesitancy.  Denies any arthralgias or myalgias.  Denies any skin rashes or lesions.  Denies any bleeding or clotting tendency.  Denies any easy bruising.  Denies any hair or nail changes.  Denies any anxiety or  depression.  Remaining review of system is negative.             Medications: Updated without changes. Current Outpatient Medications  Medication Sig Dispense Refill  . busPIRone (BUSPAR) 5 MG tablet Take 5 mg by mouth 2 (two) times daily as needed (for anxiety).    . fluticasone (FLONASE) 50 MCG/ACT nasal spray Place 1 spray into both nostrils daily as needed for allergies.     Marland Kitchen losartan-hydrochlorothiazide (HYZAAR) 100-25 MG tablet Take 1 tablet by mouth daily.    Marland Kitchen oxyCODONE-acetaminophen (PERCOCET) 5-325 MG tablet Take 1-2 tablets by mouth every 8 (eight) hours as needed for moderate pain or severe pain. Post-operatively 20 tablet 0  . pravastatin (PRAVACHOL) 40 MG tablet Take 40 mg by mouth daily.     No current facility-administered medications for this visit.      Allergies:  Allergies  Allergen Reactions  . Oxycodone Other (See Comments)    Patient stated,"it keeps me wide awake and I can't sleep."  . Septra [Sulfamethoxazole-Trimethoprim] Hives and Itching    Past Medical History, Surgical history, Social history, and Family History unchanged on review.  Physical exam: Blood pressure 129/82, pulse 78, temperature 98.2 F (36.8 C), temperature source Oral, resp. rate 18, height 5\' 9"  (1.753 m), weight 183 lb 4.8 oz (83.1 kg), SpO2 96 %.    ECOG: 0   General appearance: Comfortable appearing without any discomfort Head: Normocephalic without any trauma Oropharynx: Mucous membranes are moist and pink without any thrush or ulcers. Eyes: Pupils are equal and round reactive to light. Lymph nodes: No cervical, supraclavicular, inguinal or axillary lymphadenopathy.  Heart:regular rate and rhythm.  S1 and S2 without leg edema. Lung: Clear without any rhonchi or wheezes.  No dullness to percussion. Abdomin: Soft, nontender, nondistended with good bowel sounds.  No hepatosplenomegaly. Musculoskeletal: No joint deformity or effusion.  Full range of motion  noted. Neurological: No deficits noted on motor, sensory and deep tendon reflex exam. Skin: No petechial rash or dryness.  Appeared moist.        Lab Results: Lab Results  Component Value Date   WBC 10.1 10/28/2018   HGB 17.7 (H) 10/28/2018   HCT 56.0 (H) 10/28/2018   MCV 81.5 10/28/2018   PLT 527 (H) 10/28/2018     Chemistry      Component Value Date/Time   NA 137 10/28/2018 1147   NA 141 01/18/2015 0835   K 4.0 10/28/2018 1147   K 4.1 01/18/2015 0835   CL 102 10/28/2018 1147   CO2 25 10/28/2018 1147   CO2 25 01/18/2015 0835   BUN 21 10/28/2018 1147   BUN 21.5 01/18/2015 0835   CREATININE 1.30 (H) 10/28/2018 1147   CREATININE 1.1 01/18/2015 0835      Component Value Date/Time   CALCIUM 9.7 10/28/2018 1147   CALCIUM 9.8 01/18/2015 0835   ALKPHOS 66 01/18/2015 0835   AST 15 01/18/2015 0835   ALT 16 01/18/2015 0835   BILITOT 0.91 01/18/2015 0835         Impression and Plan:  73 year old man with:    1.  Renal cell carcinoma diagnosed in April 2020.  He developed a stage IV disease with epididymis involvement and pelvic involvement.  PET CT scan did show nonspecific pelvic findings in September 2020.  The natural course of this disease was reviewed today as well as treatment options for advanced kidney cancer were reiterated.  These include oral targeted therapy versus immunotherapy or combination of both.  I recommended repeat imaging studies in the near future and if he has true measurable disease that is enlarging we will proceed with systemic therapy.  He is scheduled to have that done under the care of Dr. Tresa Moore in October.   2.  Inguinal mass and pain: Related to his disease involvement in the epididymis.  Status post surgical resection with significant improvement in his pain.  3.  Secondary polycythemia: We will continue to monitor his hemoglobin and perform phlebotomy periodically to keep his hematocrit close to 45.  4. Followup: In October for  repeat evaluation.   15 minutes was spent today face-to-face with the patient.  More than 50% of that time was dedicated to reviewing his disease status, treatment options and answering questions regarding future plan of care.  Zola Button, MD 9/24/20204:07 PM

## 2018-12-12 DIAGNOSIS — Z23 Encounter for immunization: Secondary | ICD-10-CM | POA: Diagnosis not present

## 2019-01-02 ENCOUNTER — Other Ambulatory Visit (HOSPITAL_COMMUNITY): Payer: Self-pay | Admitting: Urology

## 2019-01-02 ENCOUNTER — Other Ambulatory Visit: Payer: Self-pay

## 2019-01-02 ENCOUNTER — Ambulatory Visit (HOSPITAL_COMMUNITY)
Admission: RE | Admit: 2019-01-02 | Discharge: 2019-01-02 | Disposition: A | Discharge status: Home / Self Care | Payer: Medicare Other | Source: Ambulatory Visit | Attending: Urology | Admitting: Urology

## 2019-01-02 DIAGNOSIS — C641 Malignant neoplasm of right kidney, except renal pelvis: Secondary | ICD-10-CM | POA: Diagnosis not present

## 2019-01-02 DIAGNOSIS — J439 Emphysema, unspecified: Secondary | ICD-10-CM | POA: Diagnosis not present

## 2019-01-09 DIAGNOSIS — C61 Malignant neoplasm of prostate: Secondary | ICD-10-CM | POA: Diagnosis not present

## 2019-01-10 ENCOUNTER — Ambulatory Visit: Payer: Medicare Other | Admitting: Oncology

## 2019-01-10 ENCOUNTER — Other Ambulatory Visit: Payer: Medicare Other

## 2019-01-13 ENCOUNTER — Inpatient Hospital Stay: Payer: Medicare Other | Attending: Oncology

## 2019-01-13 ENCOUNTER — Telehealth: Payer: Self-pay | Admitting: Pharmacist

## 2019-01-13 ENCOUNTER — Inpatient Hospital Stay: Payer: Medicare Other

## 2019-01-13 ENCOUNTER — Inpatient Hospital Stay (HOSPITAL_BASED_OUTPATIENT_CLINIC_OR_DEPARTMENT_OTHER): Payer: Medicare Other | Admitting: Oncology

## 2019-01-13 ENCOUNTER — Other Ambulatory Visit: Payer: Self-pay

## 2019-01-13 VITALS — BP 153/93 | HR 65 | Temp 98.0°F | Resp 17 | Ht 69.0 in | Wt 185.9 lb

## 2019-01-13 DIAGNOSIS — Z9079 Acquired absence of other genital organ(s): Secondary | ICD-10-CM | POA: Insufficient documentation

## 2019-01-13 DIAGNOSIS — Z905 Acquired absence of kidney: Secondary | ICD-10-CM | POA: Insufficient documentation

## 2019-01-13 DIAGNOSIS — I10 Essential (primary) hypertension: Secondary | ICD-10-CM | POA: Diagnosis not present

## 2019-01-13 DIAGNOSIS — C649 Malignant neoplasm of unspecified kidney, except renal pelvis: Secondary | ICD-10-CM

## 2019-01-13 DIAGNOSIS — D751 Secondary polycythemia: Secondary | ICD-10-CM

## 2019-01-13 DIAGNOSIS — E079 Disorder of thyroid, unspecified: Secondary | ICD-10-CM | POA: Insufficient documentation

## 2019-01-13 DIAGNOSIS — Z79899 Other long term (current) drug therapy: Secondary | ICD-10-CM | POA: Insufficient documentation

## 2019-01-13 DIAGNOSIS — E039 Hypothyroidism, unspecified: Secondary | ICD-10-CM

## 2019-01-13 DIAGNOSIS — Z7189 Other specified counseling: Secondary | ICD-10-CM | POA: Diagnosis not present

## 2019-01-13 LAB — CBC WITH DIFFERENTIAL (CANCER CENTER ONLY)
Abs Immature Granulocytes: 0.03 10*3/uL (ref 0.00–0.07)
Basophils Absolute: 0.1 10*3/uL (ref 0.0–0.1)
Basophils Relative: 1 %
Eosinophils Absolute: 0.3 10*3/uL (ref 0.0–0.5)
Eosinophils Relative: 4 %
HCT: 57.7 % — ABNORMAL HIGH (ref 39.0–52.0)
Hemoglobin: 18.2 g/dL — ABNORMAL HIGH (ref 13.0–17.0)
Immature Granulocytes: 0 %
Lymphocytes Relative: 18 %
Lymphs Abs: 1.5 10*3/uL (ref 0.7–4.0)
MCH: 24.6 pg — ABNORMAL LOW (ref 26.0–34.0)
MCHC: 31.5 g/dL (ref 30.0–36.0)
MCV: 78.1 fL — ABNORMAL LOW (ref 80.0–100.0)
Monocytes Absolute: 1 10*3/uL (ref 0.1–1.0)
Monocytes Relative: 11 %
Neutro Abs: 5.7 10*3/uL (ref 1.7–7.7)
Neutrophils Relative %: 66 %
Platelet Count: 252 10*3/uL (ref 150–400)
RBC: 7.39 MIL/uL — ABNORMAL HIGH (ref 4.22–5.81)
RDW: 18.6 % — ABNORMAL HIGH (ref 11.5–15.5)
WBC Count: 8.6 10*3/uL (ref 4.0–10.5)
nRBC: 0 % (ref 0.0–0.2)

## 2019-01-13 MED ORDER — AXITINIB 5 MG PO TABS: 5.0000 mg | ORAL_TABLET | Freq: Two times a day (BID) | ORAL | 0 refills | Status: DC

## 2019-01-13 MED ORDER — PROCHLORPERAZINE MALEATE 10 MG PO TABS: 10.0000 mg | ORAL_TABLET | Freq: Four times a day (QID) | ORAL | 0 refills | Status: DC | PRN

## 2019-01-13 MED ORDER — LIDOCAINE-PRILOCAINE 2.5-2.5 % EX CREA: 1.0000 "application " | TOPICAL_CREAM | CUTANEOUS | 0 refills | Status: DC | PRN

## 2019-01-13 NOTE — Telephone Encounter (Signed)
Oral Oncology Pharmacist Encounter  Insurance authorization for Inlyta (axitinib) 5 mg tablets was submitted to [employer] prescription insurance coverage on Cover My Meds  Key: [id number] Status is approved Effective dates: 12/14/18-01/13/20  Patient will be allowed one-fill at a local pharmacy, subsequent fills will be dispensed at [place], [place], [place], location, [facility] department.  Johny Drilling, PharmD, BCPS, BCOP  01/13/2019 11:06 AM Oral Oncology Clinic [phone]

## 2019-01-13 NOTE — Progress Notes (Signed)
[name] presents today for phlebotomy per MD orders. 16g phlebotomy kit used. IV needle inserted into LAC. Phlebotomy procedure started at 0954 and ended at 1000. 559 grams removed. Patient declined 30 minutes observation after procedure. Patient tolerated procedure well. IV needle removed intact.

## 2019-01-13 NOTE — Telephone Encounter (Signed)
Oral Oncology Pharmacist Encounter  Received new prescription for Inlyta (axitinib) for the 1st line treatment of metastatic renal cell carcinoma in conjunction with Keytruda (pembrolizumab), planned duration until disease progression or unacceptable toxicity. Planned start date 01/26/19  Axitinib is planned to be administered at  Pembrolizumab is planned to be administered at 200 mg IV given once every 3 weeks  Labs from Epic assessed, OK for treatment initiation.  Last cmet from 10/24/18 at [facility], no evidence of hepatic dysfunction  10/28/18 SCr=1.3, est CrCl ~ 60 mL/min  BPs in Epic reviewed, readings all over the place, patient will be informed about risk of increase in blood pressure with the use of axitinib, noted patient on anti-hypertensive therapy  No other cardiac history is noted  No baseline TSH, will be monitored periodically on treatment  No recent urine protein assessment, will be monitored periodically on treatment  Current medication list in Epic reviewed, no DDIs with axitinib identified.  Prescription has been e-scribed to the [facility] for benefits analysis and approval.  Oral Oncology Clinic will continue to follow for insurance authorization, copayment issues, initial counseling and start date.  Johny Drilling, PharmD, BCPS, BCOP  01/13/2019 10:17 AM Oral Oncology Clinic [phone]

## 2019-01-13 NOTE — Patient Instructions (Signed)

## 2019-01-13 NOTE — Progress Notes (Signed)
Hematology and Oncology Follow Up Visit  [name] [id number] [birth year] 73 y.o. 01/13/2019 8:44 AM [name], DO   Principle Diagnosis: 73 year old man with:  1.  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  2.  Secondary polycythemia diagnosed in 2011.  Etiology likely related to malignancy.   Prior Therapy:   Therapeutic phlebotomy as needed.  He status post radical nephrectomy April 2020.  The final pathology showed clear cell histology invading into the perineural fat indicating stage T3a.  Tumor margins are negative.  He status post orchiectomy completed on 10/29/2018.  He final pathology showed metastatic carcinoma consistent with clear cell histology.  Current therapy:   Phlebotomy every 6 months to keep his hematocrit less than 45.  He is on active surveillance with consideration to start systemic therapy for his kidney cancer.  Interim History:  [name] is here for ear follow-up.  Since the last visit, he reports feeling reasonably well without any complaints at this time.  He denies any nausea vomiting or abdominal pain.  He denies any constitutional symptoms.  He does not report any discomfort related to his surgery.  Continues to be active and attends to activities of daily living.   He denied headaches, blurry vision, syncope or seizures.  Denies any fevers, chills or sweats.  Denied chest pain, palpitation, orthopnea or leg edema.  Denied cough, wheezing or hemoptysis.  Denied nausea, vomiting or abdominal pain.  Denies any constipation or diarrhea.  Denies any frequency urgency or hesitancy.  Denies any arthralgias or myalgias.  Denies any skin rashes or lesions.  Denies any bleeding or clotting tendency.  Denies any easy bruising.  Denies any hair or nail changes.  Denies any anxiety or depression.  Remaining review of system is  negative.               Medications: Unchanged on review. Current Outpatient Medications  Medication Sig Dispense Refill  . busPIRone (BUSPAR) 5 MG tablet Take 5 mg by mouth 2 (two) times daily as needed (for anxiety).    . fluticasone (FLONASE) 50 MCG/ACT nasal spray Place 1 spray into both nostrils daily as needed for allergies.     Marland Kitchen losartan-hydrochlorothiazide (HYZAAR) 100-25 MG tablet Take 1 tablet by mouth daily.    Marland Kitchen oxyCODONE-acetaminophen (PERCOCET) 5-325 MG tablet Take 1-2 tablets by mouth every 8 (eight) hours as needed for moderate pain or severe pain. Post-operatively 20 tablet 0  . pravastatin (PRAVACHOL) 40 MG tablet Take 40 mg by mouth daily.     No current facility-administered medications for this visit.      Allergies:  Allergies  Allergen Reactions  . Oxycodone Other (See Comments)    Patient stated,"it keeps me wide awake and I can't sleep."  . Septra [Sulfamethoxazole-Trimethoprim] Hives and Itching    Past Medical History, Surgical history, Social history, and Family History updated without any changes.  Physical exam:  Blood pressure (!) 153/93, pulse 65, temperature 98 F (36.7 C), temperature source Oral, resp. rate 17, height 5\' 9"  (1.753 m), weight 185 lb 14.4 oz (84.3 kg), SpO2 99 %.    ECOG: 0     General appearance: Alert, awake without any distress. Head: Atraumatic without abnormalities Oropharynx: Without any thrush or ulcers. Eyes: No scleral icterus. Lymph nodes: No lymphadenopathy noted in the cervical, supraclavicular, or axillary nodes Heart:regular rate and rhythm, without any murmurs or  gallops.   Lung: Clear to auscultation without any rhonchi, wheezes or dullness to percussion. Abdomin: Soft, nontender without any shifting dullness or ascites. Musculoskeletal: No clubbing or cyanosis. Neurological: No motor or sensory deficits. Skin: No rashes or lesions. Psychiatric: Mood and affect appeared  normal.        Lab Results: Lab Results  Component Value Date   WBC 10.1 10/28/2018   HGB 17.7 (H) 10/28/2018   HCT 56.0 (H) 10/28/2018   MCV 81.5 10/28/2018   PLT 527 (H) 10/28/2018     Chemistry      Component Value Date/Time   NA 137 10/28/2018 1147   NA 141 01/18/2015 0835   K 4.0 10/28/2018 1147   K 4.1 01/18/2015 0835   CL 102 10/28/2018 1147   CO2 25 10/28/2018 1147   CO2 25 01/18/2015 0835   BUN 21 10/28/2018 1147   BUN 21.5 01/18/2015 0835   CREATININE 1.30 (H) 10/28/2018 1147   CREATININE 1.1 01/18/2015 0835      Component Value Date/Time   CALCIUM 9.7 10/28/2018 1147   CALCIUM 9.8 01/18/2015 0835   ALKPHOS 66 01/18/2015 0835   AST 15 01/18/2015 0835   ALT 16 01/18/2015 0835   BILITOT 0.91 01/18/2015 0835         Impression and Plan:  73 year old man with:    1.  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  He is currently recovering from his surgical resection outlined above and under consideration to start systemic therapy.  The natural course of this disease was updated and the treatment options were reiterated at this time.  CT scan obtained on 01/02/2019 by Dr. Tresa Moore showed a hypervascular lesion in the right hepatic lobe which is enlarging and suspect metastatic disease.  Given these findings, I recommended starting therapy in the immediate future given his disease progression.  He would be an excellent candidate for combination of axitinib and pembrolizumab for better overall response rate.  Complication associated with this therapy include nausea, vomiting, hypertension, and diarrhea.  Immune mediated complications are also reiterated to include pneumonitis, colitis and thyroid disease.  After discussion today he is agreeable to proceed.  2.  Inguinal mass and pain: His pain has improved since his surgical resection what appears to be metastatic disease to the epididymis.   3.  Secondary  polycythemia: He will receive phlebotomy today and periodically specially starting on axitinib.  4.  IV access: Risks and benefits of Port-A-Cath insertion was reviewed today.  Complications that include bleeding, thrombosis among others were reviewed.  He is agreeable to have that done in the future.  5.  Prognosis and goals of care: His disease appears to be incurable although aggressive measures are warranted given his excellent performance status.  4. Followup: We will be in the immediate future to start systemic therapy.  25 minutes was spent today face-to-face with the patient.  More than 50% of that time was spent on reviewing his imaging studies, discussing treatment options as well as complications related to therapy.  Zola Button, MD 10/30/20208:44 AM

## 2019-01-13 NOTE — Progress Notes (Signed)
START ON PATHWAY REGIMEN - Renal Cell     A cycle is every 21 days:     Axitinib      Pembrolizumab   **Always confirm dose/schedule in your pharmacy ordering system**  Patient Characteristics: Stage IV/Metastatic Disease, Clear Cell, First Line, Intermediate or Poor Risk Therapeutic Status: Stage IV/Metastatic Disease Histology: Clear Cell Line of Therapy: First Line Risk Status: Poor Risk Intent of Therapy: Non-Curative / Palliative Intent, Discussed with Patient 

## 2019-01-16 ENCOUNTER — Telehealth: Payer: Self-pay | Admitting: Oncology

## 2019-01-16 DIAGNOSIS — M24812 Other specific joint derangements of left shoulder, not elsewhere classified: Secondary | ICD-10-CM | POA: Diagnosis not present

## 2019-01-16 DIAGNOSIS — M4692 Unspecified inflammatory spondylopathy, cervical region: Secondary | ICD-10-CM | POA: Diagnosis not present

## 2019-01-16 NOTE — Telephone Encounter (Signed)
Scheduled appt per 10/30 los.  Spoke with pt and he is aware of his appt date and time.

## 2019-01-19 ENCOUNTER — Other Ambulatory Visit: Payer: Self-pay | Admitting: Student

## 2019-01-19 ENCOUNTER — Other Ambulatory Visit: Payer: Self-pay

## 2019-01-19 ENCOUNTER — Inpatient Hospital Stay: Payer: Medicare Other | Attending: Oncology

## 2019-01-19 DIAGNOSIS — Z5112 Encounter for antineoplastic immunotherapy: Secondary | ICD-10-CM | POA: Insufficient documentation

## 2019-01-19 DIAGNOSIS — C649 Malignant neoplasm of unspecified kidney, except renal pelvis: Secondary | ICD-10-CM | POA: Insufficient documentation

## 2019-01-19 DIAGNOSIS — D571 Sickle-cell disease without crisis: Secondary | ICD-10-CM | POA: Insufficient documentation

## 2019-01-19 DIAGNOSIS — Z79899 Other long term (current) drug therapy: Secondary | ICD-10-CM | POA: Insufficient documentation

## 2019-01-19 DIAGNOSIS — C7982 Secondary malignant neoplasm of genital organs: Secondary | ICD-10-CM | POA: Insufficient documentation

## 2019-01-20 ENCOUNTER — Other Ambulatory Visit: Payer: Self-pay | Admitting: Oncology

## 2019-01-20 ENCOUNTER — Ambulatory Visit (HOSPITAL_COMMUNITY)
Admission: RE | Admit: 2019-01-20 | Discharge: 2019-01-20 | Disposition: A | Discharge status: Home / Self Care | Payer: Medicare Other | Source: Ambulatory Visit

## 2019-01-20 ENCOUNTER — Telehealth: Payer: Self-pay

## 2019-01-20 ENCOUNTER — Encounter (HOSPITAL_COMMUNITY): Payer: Self-pay

## 2019-01-20 ENCOUNTER — Other Ambulatory Visit: Payer: Self-pay

## 2019-01-20 ENCOUNTER — Ambulatory Visit (HOSPITAL_COMMUNITY)
Admission: RE | Admit: 2019-01-20 | Discharge: 2019-01-20 | Disposition: A | Discharge status: Home / Self Care | Payer: Medicare Other | Source: Ambulatory Visit | Attending: Oncology | Admitting: Oncology

## 2019-01-20 DIAGNOSIS — Z905 Acquired absence of kidney: Secondary | ICD-10-CM | POA: Diagnosis not present

## 2019-01-20 DIAGNOSIS — F419 Anxiety disorder, unspecified: Secondary | ICD-10-CM | POA: Diagnosis not present

## 2019-01-20 DIAGNOSIS — C649 Malignant neoplasm of unspecified kidney, except renal pelvis: Secondary | ICD-10-CM | POA: Insufficient documentation

## 2019-01-20 DIAGNOSIS — C641 Malignant neoplasm of right kidney, except renal pelvis: Secondary | ICD-10-CM | POA: Diagnosis not present

## 2019-01-20 DIAGNOSIS — Z79899 Other long term (current) drug therapy: Secondary | ICD-10-CM | POA: Insufficient documentation

## 2019-01-20 DIAGNOSIS — D751 Secondary polycythemia: Secondary | ICD-10-CM | POA: Diagnosis not present

## 2019-01-20 DIAGNOSIS — Z87891 Personal history of nicotine dependence: Secondary | ICD-10-CM | POA: Insufficient documentation

## 2019-01-20 DIAGNOSIS — I10 Essential (primary) hypertension: Secondary | ICD-10-CM | POA: Insufficient documentation

## 2019-01-20 DIAGNOSIS — Z452 Encounter for adjustment and management of vascular access device: Secondary | ICD-10-CM | POA: Diagnosis not present

## 2019-01-20 HISTORY — PX: IR IMAGING GUIDED PORT INSERTION: IMG5740

## 2019-01-20 LAB — CBC
HCT: 58.9 % — ABNORMAL HIGH (ref 39.0–52.0)
Hemoglobin: 18.5 g/dL — ABNORMAL HIGH (ref 13.0–17.0)
MCH: 24.9 pg — ABNORMAL LOW (ref 26.0–34.0)
MCHC: 31.4 g/dL (ref 30.0–36.0)
MCV: 79.4 fL — ABNORMAL LOW (ref 80.0–100.0)
Platelets: 295 10*3/uL (ref 150–400)
RBC: 7.42 MIL/uL — ABNORMAL HIGH (ref 4.22–5.81)
RDW: 18.4 % — ABNORMAL HIGH (ref 11.5–15.5)
WBC: 7.6 10*3/uL (ref 4.0–10.5)
nRBC: 0 % (ref 0.0–0.2)

## 2019-01-20 LAB — PROTIME-INR
INR: 1 (ref 0.8–1.2)
Prothrombin Time: 13.4 seconds (ref 11.4–15.2)

## 2019-01-20 MED ORDER — FENTANYL CITRATE (PF) 100 MCG/2ML IJ SOLN
INTRAMUSCULAR | Status: AC
  Filled 2019-01-20: qty 2

## 2019-01-20 MED ORDER — LIDOCAINE HCL (PF) 1 % IJ SOLN
INTRAMUSCULAR | Status: AC | PRN
  Administered 2019-01-20: 10 mL

## 2019-01-20 MED ORDER — SODIUM CHLORIDE 0.9 % IV SOLN
INTRAVENOUS | Status: DC
  Administered 2019-01-20: 13:00:00 via INTRAVENOUS

## 2019-01-20 MED ORDER — FENTANYL CITRATE (PF) 100 MCG/2ML IJ SOLN
INTRAMUSCULAR | Status: AC | PRN
  Administered 2019-01-20 (×2): 50 ug via INTRAVENOUS

## 2019-01-20 MED ORDER — MIDAZOLAM HCL 2 MG/2ML IJ SOLN
INTRAMUSCULAR | Status: AC | PRN
  Administered 2019-01-20 (×2): 1 mg via INTRAVENOUS
  Administered 2019-01-20 (×2): 0.5 mg via INTRAVENOUS

## 2019-01-20 MED ORDER — LIDOCAINE-EPINEPHRINE (PF) 2 %-1:200000 IJ SOLN
INTRAMUSCULAR | Status: AC
  Filled 2019-01-20: qty 20

## 2019-01-20 MED ORDER — HEPARIN SOD (PORK) LOCK FLUSH 100 UNIT/ML IV SOLN
INTRAVENOUS | Status: AC | PRN
  Administered 2019-01-20: 500 [IU] via INTRAVENOUS

## 2019-01-20 MED ORDER — LIDOCAINE-EPINEPHRINE (PF) 2 %-1:200000 IJ SOLN
INTRAMUSCULAR | Status: AC | PRN
  Administered 2019-01-20: 10 mL

## 2019-01-20 MED ORDER — CEFAZOLIN SODIUM-DEXTROSE 2-4 GM/100ML-% IV SOLN
2.0000 g | Freq: Once | INTRAVENOUS | Status: AC
  Administered 2019-01-20: 14:00:00 2 g via INTRAVENOUS

## 2019-01-20 MED ORDER — LIDOCAINE HCL 1 % IJ SOLN
INTRAMUSCULAR | Status: AC
  Filled 2019-01-20: qty 20

## 2019-01-20 MED ORDER — CEFAZOLIN SODIUM-DEXTROSE 2-4 GM/100ML-% IV SOLN
INTRAVENOUS | Status: AC
  Administered 2019-01-20: 14:00:00 2 g via INTRAVENOUS
  Filled 2019-01-20: qty 100

## 2019-01-20 MED ORDER — MIDAZOLAM HCL 2 MG/2ML IJ SOLN
INTRAMUSCULAR | Status: AC
  Filled 2019-01-20: qty 4

## 2019-01-20 MED ORDER — HEPARIN SOD (PORK) LOCK FLUSH 100 UNIT/ML IV SOLN
INTRAVENOUS | Status: AC
  Filled 2019-01-20: qty 5

## 2019-01-20 NOTE — Consult Note (Signed)
Chief Complaint: Patient was seen in consultation today for   Referring Physician(s): [name]  Supervising Physician: Markus Daft  Patient Status: [facility] - Out-pt  History of Present Illness: [name] is a 73 y.o. male with history of secondary polycythemia diagnosed in 2011 and stage IV renal cell carcinoma with involvement of the epididymis, status post radical nephrectomy along with orchiectomy earlier this year.  He has poor venous access and presents today for Port-A-Cath placement for planned treatment as well as for repeated phlebotomies.  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   Procedure: EXCISION LEFT DUPUYTRENS RING AND SMALL FINGERS;  Surgeon: Cammie Sickle., MD;  Location: [facility];  Service: Orthopedics;  Laterality: Left;   ESOPHAGOGASTRODUODENOSCOPY (EGD) WITH PROPOFOL N/A 05/12/2018   Procedure: ESOPHAGOGASTRODUODENOSCOPY (EGD) WITH PROPOFOL;  Surgeon: Laurence Spates, MD;  Location: [facility];  Service: Endoscopy;  Laterality: N/A;   HERNIA REPAIR  2010   lt ing with lt hydrocele   ORCHIECTOMY Right 10/29/2018   Procedure: ORCHIECTOMY radical;  Surgeon: Alexis Frock, MD;  Location: [facility];  Service: Urology;  Laterality: Right;  [address] IMPLANT  2006   hx of prostate   ROBOT ASSISTED LAPAROSCOPIC NEPHRECTOMY Right 06/22/2018   Procedure: XI ROBOTIC ASSISTED LAPAROSCOPIC NEPHRECTOMY;  Surgeon: Alexis Frock, MD;  Location: [facility];  Service: Urology;  Laterality: Right;  3 HRS   SHOULDER ARTHROSCOPY  11/13   right    Allergies: Oxycodone and Septra  [sulfamethoxazole-trimethoprim]  Medications: Prior to Admission medications   Medication Sig Start Date End Date Taking? Authorizing Provider  busPIRone (BUSPAR) 5 MG tablet Take 5 mg by mouth 2 (two) times daily as needed (for anxiety).   Yes [provider]  fluticasone (FLONASE) 50 MCG/ACT nasal spray Place 1 spray into both nostrils daily as needed for allergies.    Yes [provider]  losartan-hydrochlorothiazide (HYZAAR) 100-25 MG tablet Take 1 tablet by mouth daily. 08/29/18  Yes [provider]  meloxicam (MOBIC) 15 MG tablet Take 15 mg by mouth as needed for pain.   Yes [provider]  pravastatin (PRAVACHOL) 40 MG tablet Take 40 mg by mouth daily.   Yes [provider]  axitinib (INLYTA) 5 MG tablet Take 1 tablet (5 mg total) by mouth 2 (two) times daily. Take ~12h apart with water, with or without food. 01/13/19   [name], MD  lidocaine-prilocaine (EMLA) cream Apply 1 application topically as needed. 01/13/19   [name], MD  oxyCODONE-acetaminophen (PERCOCET) 5-325 MG tablet Take 1-2 tablets by mouth every 8 (eight) hours as needed for moderate pain or severe pain. Post-operatively 10/30/18 10/30/19  Alexis Frock, MD  prochlorperazine (COMPAZINE) 10 MG tablet Take 1 tablet (10 mg total) by mouth every 6 (six) hours as needed for nausea or vomiting. 01/13/19   [name], MD     Family History  Problem Relation Age of Onset   Heart attack Father    Hypertension Father     Social History   Socioeconomic  History   Marital status: Married    Spouse name: Not on file   Number of children: Not on file   Years of education: Not on file   Highest education level: Not on file  Occupational History   Not on file  Social Needs   Financial resource strain: Not on file   Food insecurity    Worry: Not on file    Inability: Not on file   Transportation needs    Medical: Not on file    Non-medical: Not on  file  Tobacco Use   Smoking status: Former Smoker    Quit date: 06/15/1980    Years since quitting: 38.6   Smokeless tobacco: Never Used  Substance and Sexual Activity   Alcohol use: Never    Frequency: Never   Drug use: No   Sexual activity: Not on file  Lifestyle   Physical activity    Days per week: Not on file    Minutes per session: Not on file   Stress: Not on file  Relationships   Social connections    Talks on phone: Not on file    Gets together: Not on file    Attends religious service: Not on file    Active member of club or organization: Not on file    Attends meetings of clubs or organizations: Not on file    Relationship status: Not on file  Other Topics Concern   Not on file  Social History Narrative   Not on file      Review of Systems patient denies fever, chest pain, dyspnea, cough, abdominal/back pain, nausea, vomiting or bleeding.  He does have occasional headaches.  Vital Signs: Blood pressure 129/94, heart rate 77, temp 98.2, respirations 18, O2 sat 98% room air   Physical Exam awake, alert.  Chest clear to auscultation bilaterally.  Heart with regular rate and rhythm.  Abdomen soft, positive bowel sounds, nontender.  No lower extremity edema.  Imaging: Dg Chest 2 View  Result Date: 01/02/2019 CLINICAL DATA:  Patient has a hx of right kidney cancer. Right kidney removed 6 months ago. Hx of medicated hypertension. Patient denies any other hx. EXAM: CHEST - 2 VIEW COMPARISON:  Chest radiograph 04/24/2008; CT chest 04/18/2018. FINDINGS: The heart size and mediastinal contours are within normal limits. Mildly prominent interstitium may reflect chronic bronchitic change or scarring. Mild emphysema. No new focal pulmonary opacity. No pneumothorax or pleural effusion. Multilevel degenerative changes in the thoracic spine. IMPRESSION: No evidence of active disease in the chest. Mild emphysema and chronic bronchitic change/scarring. Electronically Signed    By: Audie Pinto M.D.   On: 01/02/2019 09:53    Labs:  CBC: Recent Labs    10/07/18 0834 10/28/18 1147 01/13/19 0837 01/20/19 1228  WBC 7.1 10.1 8.6 7.6  HGB 18.4* 17.7* 18.2* 18.5*  HCT 57.6* 56.0* 57.7* 58.9*  PLT 240 527* 252 295    COAGS: Recent Labs    01/20/19 1228  INR 1.0    BMP: Recent Labs    06/13/18 1030 06/23/18 0450 10/28/18 1147  NA 136 132* 137  K 3.9 4.5 4.0  CL 101 98 102  CO2 24 25 25   GLUCOSE 113* 152* 108*  BUN 14 18 21   CALCIUM 9.5 8.8* 9.7  CREATININE 1.00 1.32* 1.30*  GFRNONAA >60 54* 55*  GFRAA >60 >60 >60    LIVER FUNCTION TESTS: No results for input(s): BILITOT, AST, ALT, ALKPHOS, PROT, ALBUMIN in the last 8760 hours.  TUMOR MARKERS: No results for input(s): AFPTM, CEA, CA199, CHROMGRNA in the last 8760 hours.  Assessment and Plan: 73 y.o. male with history of secondary polycythemia diagnosed in 2011 and stage IV renal cell carcinoma with involvement of the epididymis, status post radical nephrectomy along with orchiectomy earlier this year.  He has poor venous access and presents today for Port-A-Cath placement for planned treatment as well as for repeated phlebotomies.Risks and benefits of image guided port-a-catheter placement was discussed with the patient including, but not limited to bleeding, infection, pneumothorax, or fibrin sheath development and need for additional procedures.  All of the patient's questions were answered, patient is agreeable to proceed. Consent signed and in chart.     Thank you for this interesting consult.  I greatly enjoyed meeting [name] and look forward to participating in their care.  A copy of this report was sent to the requesting provider on this date.  Electronically Signed: D. Rowe [name], PA-C 01/20/2019, 1:19 PM   I spent a total of 25 minutes in face to face in clinical consultation, greater than 50% of which was counseling/coordinating care for port a cath  placement

## 2019-01-20 NOTE — Procedures (Signed)
Interventional Radiology Procedure:   Indications: Metastatic renal cancer  Procedure: Port placement  Findings: Right jugular port, tip at SVC/RA junction  Complications: None     EBL: Minimal, less than 10 ml  Plan: Discharge in one hour.  Keep port site and incisions dry for at least 24 hours.     Nimco Bivens R. Anselm Pancoast, MD  Pager: [phone]

## 2019-01-20 NOTE — Telephone Encounter (Signed)
Oral Chemotherapy Pharmacist Encounter   I spoke with patient for overview of: Inlyta (axitinib) for the 1st line treatment of metastatic renal cell carcinoma in conjunction with Keytruda (pembrolizumab), planned duration until disease progression or unacceptable toxicity.   Counseled patient on administration, dosing, side effects, monitoring, drug-food interactions, safe handling, storage, and disposal.  Patient will take Inlyta 5mg  tablets, 1 tablet by mouth twice daily, without regard to food, with a glass of water.  Patient instructed to avoid grapefruit and grapefruit juice while on therapy with Inlyta.  Inlyta start date: 01/25/19 Beryle Flock start date: 01/26/19  Adverse effects of Inlyta include but are not limited to: hypertension, hand-foot syndrome, nausea, vomiting, diarrhea, fatigue, dysphonia (hoarseness), and abnormal laboratory values.    Adverse effects of Keytruda will be immune-mediated in nature and include but are not limited to: dermatitis, colitis, pneumonitis, and thyroid dysfunction.  Patient will obtain anti diarrheal and alert the office of 4 or more loose stools above baseline. Patient informed adverse events secondary to [facility] would be managed with steroids.  Inlyta will be held at least 24 hours prior to surgery and resumed at discretion of treating physician based on wound healing  Reviewed with patient importance of keeping a medication schedule and plan for any missed doses.  Medication reconciliation performed and medication/allergy list updated.  Insurance authorization for Bartholomew Boards has been obtained. Test claim at the pharmacy revealed copayment $60 for 1st fill of Inlyta. Oral oncology patient advocate was successful in securing foundation copayment grant to cover these out of pocket costs at the pharmacy. This will ship from the [facility] on 01/23/19 to deliver to patient's home on 11/10.  Patient informed the pharmacy will  reach out 5-7 days prior to needing next fill of Inlyta to coordinate continued medication acquisition to prevent break in therapy.  Patient did express feelings of being overwhelmed by bad news and the course his disease has taken. He specifically asks about intended length of therapy, other therapy options, and life expectancy. Patient stated he did not want me to have Dr. Alen Blew contact to answer these questions immediately, patient will address with Dr. Alen Blew at his next office visit.  All questions answered.  Mr. Kratovil voiced understanding and appreciation.   Patient knows to call the office with questions or concerns.  Johny Drilling, PharmD, BCPS, BCOP  01/20/2019   9:48 AM Oral Oncology Clinic [phone]

## 2019-01-20 NOTE — Discharge Instructions (Signed)
Do not use EMLA cream on the skin glue over your new port. The petroleum in the EMLA cream will dissolve the skin glue. The skin over your new port will pull apart resulting in an infection Use ice in a zip lock over your new port for 2-3 minutes prior to the nurses accessing your new port.    Implanted Port Insertion, Care After This sheet gives you information about how to care for yourself after your procedure. Your health care provider may also give you more specific instructions. If you have problems or questions, contact your health care provider. What can I expect after the procedure? After the procedure, it is common to have:  Discomfort at the port insertion site.  Bruising on the skin over the port. This should improve over 3-4 days. Follow these instructions at home: [facility] care  After your port is placed, you will get a manufacturer's information card. The card has information about your port. Keep this card with you at all times.  Take care of the port as told by your health care provider. Ask your health care provider if you or a family member can get training for taking care of the port at home. A home health care nurse may also take care of the port.  Make sure to remember what type of port you have. Incision care      Follow instructions from your health care provider about how to take care of your port insertion site. Make sure you: ? Wash your hands with soap and water before and after you change your bandage (dressing). If soap and water are not available, use hand sanitizer. ? Change your dressing as told by your health care provider. ? Leave  skin glue in place. These skin closures may need to stay in place for 2 weeks or longer.   Check your port insertion site every day for signs of infection. Check for: ? Redness, swelling, or pain. ? Fluid or blood. ? Warmth. ? Pus or a bad smell. Activity  Return to your normal activities as told by your health care  provider. Ask your health care provider what activities are safe for you.  Do not lift anything that is heavier than 10 lb (4.5 kg), or the limit that you are told, until your health care provider says that it is safe. General instructions  Take over-the-counter and prescription medicines only as told by your health care provider.  Do not take baths, swim, or use a hot tub until your health care provider approves. Ask your health care provider if you may take showers. You may only be allowed to take sponge baths.  Do not drive for 24 hours if you were given a sedative during your procedure.  Wear a medical alert bracelet in case of an emergency. This will tell any health care providers that you have a port.  Keep all follow-up visits as told by your health care provider. This is important. Contact a health care provider if:  You cannot flush your port with saline as directed, or you cannot draw blood from the port.  You have a fever or chills.  You have redness, swelling, or pain around your port insertion site.  You have fluid or blood coming from your port insertion site.  Your port insertion site feels warm to the touch.  You have pus or a bad smell coming from the port insertion site. Get help right away if:  You have chest pain or  shortness of breath.  You have bleeding from your port that you cannot control. Summary  Take care of the port as told by your health care provider. Keep the manufacturer's information card with you at all times.  Change your dressing as told by your health care provider.  Contact a health care provider if you have a fever or chills or if you have redness, swelling, or pain around your port insertion site.  Keep all follow-up visits as told by your health care provider. This information is not intended to replace advice given to you by your health care provider. Make sure you discuss any questions you have with your health care  provider. Document Released: 12/21/2012 Document Revised: 09/28/2017 Document Reviewed: 09/28/2017 Elsevier Patient Education  [place].      Moderate Conscious Sedation, Adult, Care After These instructions provide you with information about caring for yourself after your procedure. Your health care provider may also give you more specific instructions. Your treatment has been planned according to current medical practices, but problems sometimes occur. Call your health care provider if you have any problems or questions after your procedure. What can I expect after the procedure? After your procedure, it is common:  To feel sleepy for several hours.  To feel clumsy and have poor balance for several hours.  To have poor judgment for several hours.  To vomit if you eat too soon. Follow these instructions at home: For at least 24 hours after the procedure:   Do not: ? Participate in activities where you could fall or become injured. ? Drive. ? Use heavy machinery. ? Drink alcohol. ? Take sleeping pills or medicines that cause drowsiness. ? Make important decisions or sign legal documents. ? Take care of children on your own.  Rest. Eating and drinking  Follow the diet recommended by your health care provider.  If you vomit: ? Drink water, juice, or soup when you can drink without vomiting. ? Make sure you have little or no nausea before eating solid foods. General instructions  Have a responsible adult stay with you until you are awake and alert.  Take over-the-counter and prescription medicines only as told by your health care provider.  If you smoke, do not smoke without supervision.  Keep all follow-up visits as told by your health care provider. This is important. Contact a health care provider if:  You keep feeling nauseous or you keep vomiting.  You feel light-headed.  You develop a rash.  You have a fever. Get help right away if:  You have  trouble breathing. This information is not intended to replace advice given to you by your health care provider. Make sure you discuss any questions you have with your health care provider. Document Released: 12/21/2012 Document Revised: 02/12/2017 Document Reviewed: 06/22/2015 Elsevier Patient Education  2020 [employer].

## 2019-01-20 NOTE — Telephone Encounter (Signed)
Oral Oncology Patient Advocate Encounter   Was successful in securing patient a $7000 grant from [employer] [place] to provide copayment coverage for Inlyta.  This will keep the out of pocket expense at $0.     I have spoken with the patient.    The billing information is as follows and has been shared with [place].   Member ID: [id number] Group ID: [id number] RxBin: [id number] PCN: [id number] Dates of Eligibility: 01/20/19 through 01/20/20  Fund name:  Renal Cell  [place] Patient [place] Phone [phone] Fax [phone] 01/20/2019    11:05 AM

## 2019-01-23 MED FILL — INLYTA 5 MG TABLET: 5 | 30 days supply | Qty: 60 | Fill #0

## 2019-01-24 NOTE — Telephone Encounter (Signed)
Oral Oncology Pharmacist Encounter  Confirmed with the [facility] outpatient pharmacy that [name] was shipped on 01/23/19 for copayment $0.  Patient was allowed 1 fill from a local pharmacy, subsequent fills from [facility] in [place], [place] per [facility] insurance requirement.  Johny Drilling, PharmD, BCPS, BCOP  01/24/2019   7:54 AM Oral Oncology Clinic [phone]

## 2019-01-25 ENCOUNTER — Encounter: Payer: Self-pay | Admitting: Oncology

## 2019-01-25 NOTE — Progress Notes (Signed)
Called pt to introduce myself as his Arboriculturist.  Pt has 2 insurances so copay assistance isn't needed.  I offered the [place], went over what it covers and gave him the income requirement.  Pt stated he exceeds that amount so he doesn't qualify for the grant at this time.  I requested that the front staff give him my card for any questions or concerns he may have in the future.

## 2019-01-26 ENCOUNTER — Telehealth: Payer: Self-pay

## 2019-01-26 ENCOUNTER — Other Ambulatory Visit: Payer: Self-pay

## 2019-01-26 ENCOUNTER — Inpatient Hospital Stay: Payer: Medicare Other

## 2019-01-26 VITALS — BP 152/98 | HR 69 | Temp 98.3°F | Resp 18

## 2019-01-26 DIAGNOSIS — C649 Malignant neoplasm of unspecified kidney, except renal pelvis: Secondary | ICD-10-CM

## 2019-01-26 DIAGNOSIS — D571 Sickle-cell disease without crisis: Secondary | ICD-10-CM | POA: Diagnosis not present

## 2019-01-26 DIAGNOSIS — E039 Hypothyroidism, unspecified: Secondary | ICD-10-CM

## 2019-01-26 DIAGNOSIS — Z5112 Encounter for antineoplastic immunotherapy: Secondary | ICD-10-CM | POA: Diagnosis not present

## 2019-01-26 DIAGNOSIS — C7982 Secondary malignant neoplasm of genital organs: Secondary | ICD-10-CM | POA: Diagnosis not present

## 2019-01-26 DIAGNOSIS — Z79899 Other long term (current) drug therapy: Secondary | ICD-10-CM | POA: Diagnosis not present

## 2019-01-26 LAB — CMP (CANCER CENTER ONLY)
ALT: 10 U/L (ref 0–44)
AST: 14 U/L — ABNORMAL LOW (ref 15–41)
Albumin: 3.8 g/dL (ref 3.5–5.0)
Alkaline Phosphatase: 85 U/L (ref 38–126)
Anion gap: 10 (ref 5–15)
BUN: 20 mg/dL (ref 8–23)
CO2: 27 mmol/L (ref 22–32)
Calcium: 9.7 mg/dL (ref 8.9–10.3)
Chloride: 104 mmol/L (ref 98–111)
Creatinine: 1.35 mg/dL — ABNORMAL HIGH (ref 0.61–1.24)
GFR, Est AFR Am: >60 mL/min (ref >60)
GFR, Estimated: 52 mL/min — ABNORMAL LOW (ref >60)
Glucose, Bld: 120 mg/dL — ABNORMAL HIGH (ref 70–99)
Potassium: 4 mmol/L (ref 3.5–5.1)
Sodium: 141 mmol/L (ref 135–145)
Total Bilirubin: 0.6 mg/dL (ref 0.3–1.2)
Total Protein: 8 g/dL (ref 6.5–8.1)

## 2019-01-26 LAB — CBC WITH DIFFERENTIAL (CANCER CENTER ONLY)
Abs Immature Granulocytes: 0.02 10*3/uL (ref 0.00–0.07)
Basophils Absolute: 0 10*3/uL (ref 0.0–0.1)
Basophils Relative: 1 %
Eosinophils Absolute: 0.1 10*3/uL (ref 0.0–0.5)
Eosinophils Relative: 2 %
HCT: 56.9 % — ABNORMAL HIGH (ref 39.0–52.0)
Hemoglobin: 18 g/dL — ABNORMAL HIGH (ref 13.0–17.0)
Immature Granulocytes: 0 %
Lymphocytes Relative: 23 %
Lymphs Abs: 1.6 10*3/uL (ref 0.7–4.0)
MCH: 24.8 pg — ABNORMAL LOW (ref 26.0–34.0)
MCHC: 31.6 g/dL (ref 30.0–36.0)
MCV: 78.5 fL — ABNORMAL LOW (ref 80.0–100.0)
Monocytes Absolute: 0.5 10*3/uL (ref 0.1–1.0)
Monocytes Relative: 8 %
Neutro Abs: 4.6 10*3/uL (ref 1.7–7.7)
Neutrophils Relative %: 66 %
Platelet Count: 266 10*3/uL (ref 150–400)
RBC: 7.25 MIL/uL — ABNORMAL HIGH (ref 4.22–5.81)
RDW: 17.9 % — ABNORMAL HIGH (ref 11.5–15.5)
WBC Count: 6.9 10*3/uL (ref 4.0–10.5)
nRBC: 0 % (ref 0.0–0.2)

## 2019-01-26 LAB — TSH: TSH: 0.639 u[IU]/mL (ref 0.320–4.118)

## 2019-01-26 LAB — TOTAL PROTEIN, URINE DIPSTICK: Protein, ur: NEGATIVE mg/dL

## 2019-01-26 MED ORDER — SODIUM CHLORIDE 0.9% FLUSH
10.0000 mL | Freq: Once | INTRAVENOUS | Status: AC
  Administered 2019-01-26: 10 mL
  Filled 2019-01-26: qty 10

## 2019-01-26 MED ORDER — HEPARIN SOD (PORK) LOCK FLUSH 100 UNIT/ML IV SOLN
500.0000 [IU] | Freq: Once | INTRAVENOUS | Status: AC | PRN
  Administered 2019-01-26: 500 [IU]
  Filled 2019-01-26: qty 5

## 2019-01-26 MED ORDER — SODIUM CHLORIDE 0.9 % IV SOLN
200.0000 mg | Freq: Once | INTRAVENOUS | Status: AC
  Administered 2019-01-26: 200 mg via INTRAVENOUS
  Filled 2019-01-26: qty 8

## 2019-01-26 MED ORDER — SODIUM CHLORIDE 0.9% FLUSH
10.0000 mL | INTRAVENOUS | Status: DC | PRN
  Administered 2019-01-26: 10 mL
  Filled 2019-01-26: qty 10

## 2019-01-26 MED ORDER — SODIUM CHLORIDE 0.9 % IV SOLN
Freq: Once | INTRAVENOUS | Status: AC
  Administered 2019-01-26: 13:00:00 via INTRAVENOUS
  Filled 2019-01-26: qty 250

## 2019-01-26 NOTE — Patient Instructions (Signed)
Pembrolizumab injection What is this medicine? PEMBROLIZUMAB (pem broe liz ue mab) is a monoclonal antibody. It is used to treat bladder cancer, cervical cancer, endometrial cancer, esophageal cancer, head and neck cancer, hepatocellular cancer, Hodgkin lymphoma, kidney cancer, lymphoma, melanoma, Merkel cell carcinoma, lung cancer, stomach cancer, urothelial cancer, and cancers that have a certain genetic condition. This medicine may be used for other purposes; ask your health care provider or pharmacist if you have questions. COMMON BRAND NAME(S): Keytruda What should I tell my health care provider before I take this medicine? They need to know if you have any of these conditions:  diabetes  immune system problems  inflammatory bowel disease  liver disease  lung or breathing disease  lupus  received or scheduled to receive an organ transplant or a stem-cell transplant that uses donor stem cells  an unusual or allergic reaction to pembrolizumab, other medicines, foods, dyes, or preservatives  pregnant or trying to get pregnant  breast-feeding How should I use this medicine? This medicine is for infusion into a vein. It is given by a health care professional in a hospital or clinic setting. A special MedGuide will be given to you before each treatment. Be sure to read this information carefully each time. Talk to your pediatrician regarding the use of this medicine in children. While this drug may be prescribed for selected conditions, precautions do apply. Overdosage: If you think you have taken too much of this medicine contact a poison control center or emergency room at once. NOTE: This medicine is only for you. Do not share this medicine with others. What if I miss a dose? It is important not to miss your dose. Call your doctor or health care professional if you are unable to keep an appointment. What may interact with this medicine? Interactions have not been studied. Give  your health care provider a list of all the medicines, herbs, non-prescription drugs, or dietary supplements you use. Also tell them if you smoke, drink alcohol, or use illegal drugs. Some items may interact with your medicine. This list may not describe all possible interactions. Give your health care provider a list of all the medicines, herbs, non-prescription drugs, or dietary supplements you use. Also tell them if you smoke, drink alcohol, or use illegal drugs. Some items may interact with your medicine. What should I watch for while using this medicine? Your condition will be monitored carefully while you are receiving this medicine. You may need blood work done while you are taking this medicine. Do not become pregnant while taking this medicine or for 4 months after stopping it. Women should inform their doctor if they wish to become pregnant or think they might be pregnant. There is a potential for serious side effects to an unborn child. Talk to your health care professional or pharmacist for more information. Do not breast-feed an infant while taking this medicine or for 4 months after the last dose. What side effects may I notice from receiving this medicine? Side effects that you should report to your doctor or health care professional as soon as possible:  allergic reactions like skin rash, itching or hives, swelling of the face, lips, or tongue  bloody or black, tarry  breathing problems  changes in vision  chest pain  chills  confusion  constipation  cough  diarrhea  dizziness or feeling faint or lightheaded  fast or irregular heartbeat  fever  flushing  hair loss  joint pain  low blood counts - this   medicine may decrease the number of white blood cells, red blood cells and platelets. You may be at increased risk for infections and bleeding.  muscle pain  muscle weakness  persistent headache  redness, blistering, peeling or loosening of the skin,  including inside the mouth  signs and symptoms of high blood sugar such as dizziness; dry mouth; dry skin; fruity breath; nausea; stomach pain; increased hunger or thirst; increased urination  signs and symptoms of kidney injury like trouble passing urine or change in the amount of urine  signs and symptoms of liver injury like dark urine, light-colored stools, loss of appetite, nausea, right upper belly pain, yellowing of the eyes or skin  sweating  swollen lymph nodes  weight loss Side effects that usually do not require medical attention (report to your doctor or health care professional if they continue or are bothersome):  decreased appetite  muscle pain  tiredness This list may not describe all possible side effects. Call your doctor for medical advice about side effects. You may report side effects to FDA at 1-800-FDA-1088. Where should I keep my medicine? This drug is given in a hospital or clinic and will not be stored at home. NOTE: This sheet is a summary. It may not cover all possible information. If you have questions about this medicine, talk to your doctor, pharmacist, or health care provider.  2020 Elsevier/Gold Standard (2018-03-29 13:46:58)  

## 2019-01-26 NOTE — Telephone Encounter (Signed)
-----   Message from [name], MD sent at 01/26/2019  9:06 AM EST ----- Regarding: RE: Requesting Clarification No phlebotomy. Thanks ----- Message ----- From: Teodoro Spray, RN Sent: 01/26/2019   8:27 AM EST To: [name], MD Subject: Requesting Clarification                       I have [name] coming in later today for his first Keytruda infusion.  I just want to clarify if he will also be getting a phlebotomy during this visit (if he meets the parameters).   Please advise.   Thank you

## 2019-01-26 NOTE — Telephone Encounter (Signed)
Please see note below. Per Dr. Alen Blew, patient to receive Keytruda only, no phlebotomy during this visit.

## 2019-01-27 ENCOUNTER — Telehealth: Payer: Self-pay | Admitting: *Deleted

## 2019-02-06 ENCOUNTER — Telehealth: Payer: Self-pay | Admitting: Pharmacist

## 2019-02-06 DIAGNOSIS — C649 Malignant neoplasm of unspecified kidney, except renal pelvis: Secondary | ICD-10-CM

## 2019-02-06 MED ORDER — AXITINIB 5 MG PO TABS: 5.0000 mg | ORAL_TABLET | Freq: Two times a day (BID) | ORAL | 1 refills | Status: DC

## 2019-02-06 NOTE — Telephone Encounter (Signed)
Oral Oncology Pharmacist Encounter  Patient must fill Inlyta (axitinib) at [facility] in [place], [place] per [facility] prescription insurance coverage. Patient was allowed 1 fill at a local pharmacy and subsequent fills are from mandated dispensing pharmacy. Inlyta prescription has been e-scribed to [facility].  Spoke with patient today to follow up regarding patient's oral chemotherapy medication: Inlyta (axitinib) for the 1st line treatment of metastatic renal cell carcinoma in conjunction with Keytruda (pembrolizumab), planned duration until disease progression or unacceptable toxicity.  Original Start date of oral chemotherapy: 01/26/2019  Pt reports 0 tablets/doses of Inlyta 5mg  tablets, 1 tablet by mouth twice daily, without regard to food, with a glass of water missed in the last 2 weeks.   Pt reports the following side effects:   Fatigue- patient has to take more breaks doing activities, currently manageable  Joint pain that is worse in the morning and then resolves throughout the day  Shortness of breath, patient states this goes with the fatigue. Patient instructed to alert the office with worsening issues.  Headaches that may be coming from degenerative disk issues, but have increased in frequency since starting therapy. Patient instructed to alert the office with worsening issues.  Pertinent labs reviewed: OK for continued treatment.  Other Issues: Patient provided information about dispensing pharmacy change. He was provided the phone number to the [facility] department at [facility] [phone]) and informed the pharmacy will contact him to schedule his 1st shipment from their pharmacy.  Patient knows to call the office with questions or concerns.  Johny Drilling, PharmD, BCPS, BCOP  02/06/2019 8:15 AM Oral Oncology Clinic [phone]

## 2019-02-15 ENCOUNTER — Inpatient Hospital Stay: Payer: Medicare Other | Attending: Oncology

## 2019-02-15 ENCOUNTER — Inpatient Hospital Stay: Payer: Medicare Other

## 2019-02-15 ENCOUNTER — Inpatient Hospital Stay (HOSPITAL_BASED_OUTPATIENT_CLINIC_OR_DEPARTMENT_OTHER): Payer: Medicare Other | Admitting: Oncology

## 2019-02-15 ENCOUNTER — Other Ambulatory Visit: Payer: Self-pay

## 2019-02-15 VITALS — BP 159/101 | HR 65 | Temp 98.4°F | Resp 18 | Ht 69.0 in | Wt 183.5 lb

## 2019-02-15 VITALS — BP 146/108

## 2019-02-15 DIAGNOSIS — Z9079 Acquired absence of other genital organ(s): Secondary | ICD-10-CM | POA: Insufficient documentation

## 2019-02-15 DIAGNOSIS — Z95828 Presence of other vascular implants and grafts: Secondary | ICD-10-CM

## 2019-02-15 DIAGNOSIS — Z905 Acquired absence of kidney: Secondary | ICD-10-CM | POA: Diagnosis not present

## 2019-02-15 DIAGNOSIS — D751 Secondary polycythemia: Secondary | ICD-10-CM | POA: Insufficient documentation

## 2019-02-15 DIAGNOSIS — C649 Malignant neoplasm of unspecified kidney, except renal pelvis: Secondary | ICD-10-CM | POA: Diagnosis not present

## 2019-02-15 DIAGNOSIS — Z79899 Other long term (current) drug therapy: Secondary | ICD-10-CM | POA: Insufficient documentation

## 2019-02-15 DIAGNOSIS — Z5112 Encounter for antineoplastic immunotherapy: Secondary | ICD-10-CM | POA: Diagnosis present

## 2019-02-15 DIAGNOSIS — Z85528 Personal history of other malignant neoplasm of kidney: Secondary | ICD-10-CM | POA: Diagnosis not present

## 2019-02-15 DIAGNOSIS — C7982 Secondary malignant neoplasm of genital organs: Secondary | ICD-10-CM | POA: Diagnosis present

## 2019-02-15 DIAGNOSIS — F419 Anxiety disorder, unspecified: Secondary | ICD-10-CM | POA: Diagnosis not present

## 2019-02-15 DIAGNOSIS — I10 Essential (primary) hypertension: Secondary | ICD-10-CM | POA: Diagnosis not present

## 2019-02-15 LAB — CBC WITH DIFFERENTIAL (CANCER CENTER ONLY)
Abs Immature Granulocytes: 0.03 10*3/uL (ref 0.00–0.07)
Basophils Absolute: 0.1 10*3/uL (ref 0.0–0.1)
Basophils Relative: 1 %
Eosinophils Absolute: 0.6 10*3/uL — ABNORMAL HIGH (ref 0.0–0.5)
Eosinophils Relative: 8 %
HCT: 58.4 % — ABNORMAL HIGH (ref 39.0–52.0)
Hemoglobin: 18.7 g/dL — ABNORMAL HIGH (ref 13.0–17.0)
Immature Granulocytes: 0 %
Lymphocytes Relative: 14 %
Lymphs Abs: 0.9 10*3/uL (ref 0.7–4.0)
MCH: 24.7 pg — ABNORMAL LOW (ref 26.0–34.0)
MCHC: 32 g/dL (ref 30.0–36.0)
MCV: 77 fL — ABNORMAL LOW (ref 80.0–100.0)
Monocytes Absolute: 0.7 10*3/uL (ref 0.1–1.0)
Monocytes Relative: 11 %
Neutro Abs: 4.5 10*3/uL (ref 1.7–7.7)
Neutrophils Relative %: 66 %
Platelet Count: 170 10*3/uL (ref 150–400)
RBC: 7.58 MIL/uL — ABNORMAL HIGH (ref 4.22–5.81)
RDW: 17 % — ABNORMAL HIGH (ref 11.5–15.5)
WBC Count: 6.8 10*3/uL (ref 4.0–10.5)
nRBC: 0 % (ref 0.0–0.2)

## 2019-02-15 LAB — CMP (CANCER CENTER ONLY)
ALT: 19 U/L (ref 0–44)
AST: 24 U/L (ref 15–41)
Albumin: 3.9 g/dL (ref 3.5–5.0)
Alkaline Phosphatase: 87 U/L (ref 38–126)
Anion gap: 11 (ref 5–15)
BUN: 26 mg/dL — ABNORMAL HIGH (ref 8–23)
CO2: 25 mmol/L (ref 22–32)
Calcium: 9.3 mg/dL (ref 8.9–10.3)
Chloride: 104 mmol/L (ref 98–111)
Creatinine: 1.46 mg/dL — ABNORMAL HIGH (ref 0.61–1.24)
GFR, Est AFR Am: 55 mL/min — ABNORMAL LOW (ref >60)
GFR, Estimated: 47 mL/min — ABNORMAL LOW (ref >60)
Glucose, Bld: 102 mg/dL — ABNORMAL HIGH (ref 70–99)
Potassium: 3.9 mmol/L (ref 3.5–5.1)
Sodium: 140 mmol/L (ref 135–145)
Total Bilirubin: 0.6 mg/dL (ref 0.3–1.2)
Total Protein: 7.8 g/dL (ref 6.5–8.1)

## 2019-02-15 MED ORDER — SODIUM CHLORIDE 0.9 % IV SOLN
Freq: Once | INTRAVENOUS | Status: AC
  Administered 2019-02-15: 11:00:00 via INTRAVENOUS
  Filled 2019-02-15: qty 250

## 2019-02-15 MED ORDER — HEPARIN SOD (PORK) LOCK FLUSH 100 UNIT/ML IV SOLN
500.0000 [IU] | Freq: Once | INTRAVENOUS | Status: AC | PRN
  Administered 2019-02-15: 500 [IU]
  Filled 2019-02-15: qty 5

## 2019-02-15 MED ORDER — SODIUM CHLORIDE 0.9% FLUSH
10.0000 mL | INTRAVENOUS | Status: DC | PRN
  Administered 2019-02-15: 10 mL via INTRAVENOUS
  Filled 2019-02-15: qty 10

## 2019-02-15 MED ORDER — LORAZEPAM 1 MG PO TABS: 1.0000 mg | ORAL_TABLET | Freq: Three times a day (TID) | ORAL | 0 refills | Status: DC

## 2019-02-15 MED ORDER — SODIUM CHLORIDE 0.9 % IV SOLN
200.0000 mg | Freq: Once | INTRAVENOUS | Status: AC
  Administered 2019-02-15: 200 mg via INTRAVENOUS
  Filled 2019-02-15: qty 8

## 2019-02-15 MED ORDER — SODIUM CHLORIDE 0.9% FLUSH
10.0000 mL | INTRAVENOUS | Status: DC | PRN
  Administered 2019-02-15: 12:00:00 10 mL
  Filled 2019-02-15: qty 10

## 2019-02-15 NOTE — Telephone Encounter (Signed)
Oral Oncology Pharmacist Encounter  I called [facility], FEP department, at [phone] and provided [employer] copayment grant information to cover the out of pocket expenses for Inlyta at the pharmacy. Representative processed the claim and was able to use the grant information without issue.  I called and spoke with patient's wife, Opal [place], and provided above information. We discussed the grant amount and effective dates.  All questions answered. They know to call the office with any additional questions or concerns.  Johny Drilling, PharmD, BCPS, BCOP  02/15/2019 12:36 PM Oral Oncology Clinic [phone]

## 2019-02-15 NOTE — Progress Notes (Signed)
Attempted to perform phlebotomy via PAC. After 123mL, unable to aspirate or flush PAC. PAC deaccessed and phlebotomy resumed via peripheral site without further difficulty. PAC reaccessed with + blood return and flushed easily. Post phlebotomy BP remains elevated. Per Dr. Alen Blew, ok to proceed with pembrolizumab as ordered.

## 2019-02-15 NOTE — Patient Instructions (Signed)

## 2019-02-15 NOTE — Progress Notes (Signed)
Hematology and Oncology Follow Up Visit  [name] [id number] 10/26/1945 73 y.o. 02/15/2019 9:41 AM [name], DO   Principle Diagnosis: 73 year old man with:  1.  Renal cell carcinoma diagnosed in April of 2020.  He subsequently developed stage IV disease with involvement in the epididymis..  2.  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   Prior Therapy:   Therapeutic phlebotomy as needed.  He status post radical nephrectomy April 2020.  The final pathology showed clear cell histology invading into the perineural fat indicating stage T3a.  Tumor margins are negative.  He status post orchiectomy completed on 10/29/2018.  He final pathology showed metastatic carcinoma consistent with clear cell histology.  Current therapy:   Phlebotomy every 6 months to keep his hematocrit less than 45.  Pembrolizumab 200 mg every 3 weeks started on 01/26/2019.  Blood pressure he is currently taking axitinib 5 mg twice a day as well.  Interim History:  [name] returns for repeat follow-up.  Since the last visit, he started treatment with Pembrolizumab and axitinib with few complications.  He has reported issues with appetite and mouth pain although he has not lost a lot of weight.  His reports of fatigue tiredness as well as associated issues with increased blood pressure.  He denies any diarrhea, nausea or vomiting.  He denies any decline in his performance status.  He does report a lot of issues with anxiety associated with his diagnosis and future treatment.  Patient denied any alteration mental status, neuropathy, confusion or dizziness.  Denies any headaches or lethargy.  Denies any night sweats, weight loss or changes in appetite.  Denied orthopnea, dyspnea on exertion or chest discomfort.  Denies shortness of breath, difficulty breathing hemoptysis or cough.  Denies any abdominal distention, nausea, early satiety or dyspepsia.   Denies any hematuria, frequency, dysuria or nocturia.  Denies any skin irritation, dryness or rash.  Denies any ecchymosis or petechiae.  Denies any lymphadenopathy or clotting.  Denies any heat or cold intolerance.  Denies any anxiety or depression.  Remaining review of system is negative.  .               Medications: Unchanged on review. Current Outpatient Medications  Medication Sig Dispense Refill  . axitinib (INLYTA) 5 MG tablet Take 1 tablet (5 mg total) by mouth 2 (two) times daily. Take ~12h apart with water, with or without food. 60 tablet 1  . busPIRone (BUSPAR) 5 MG tablet Take 5 mg by mouth 2 (two) times daily as needed (for anxiety).    . fluticasone (FLONASE) 50 MCG/ACT nasal spray Place 1 spray into both nostrils daily as needed for allergies.     Marland Kitchen lidocaine-prilocaine (EMLA) cream Apply 1 application topically as needed. 30 g 0  . losartan-hydrochlorothiazide (HYZAAR) 100-25 MG tablet Take 1 tablet by mouth daily.    . meloxicam (MOBIC) 15 MG tablet Take 15 mg by mouth as needed for pain.    Marland Kitchen oxyCODONE-acetaminophen (PERCOCET) 5-325 MG tablet Take 1-2 tablets by mouth every 8 (eight) hours as needed for moderate pain or severe pain. Post-operatively 20 tablet 0  . pravastatin (PRAVACHOL) 40 MG tablet Take 40 mg by mouth daily.    . prochlorperazine (COMPAZINE) 10 MG tablet Take 1 tablet (10 mg total) by mouth every 6 (six) hours as needed for nausea or vomiting. 30 tablet 0   No current facility-administered medications for this visit.    Facility-Administered Medications Ordered in  Other Visits  Medication Dose Route Frequency Provider Last Rate Last Dose  . sodium chloride flush (NS) 0.9 % injection 10 mL  10 mL Intravenous PRN [name], MD   10 mL at 02/15/19 [id number]     Allergies:  Allergies  Allergen Reactions  . Oxycodone Other (See Comments)    Patient stated,"it keeps me wide awake and I can't sleep."  . Septra [Sulfamethoxazole-Trimethoprim]  Hives and Itching    Past Medical History, Surgical history, Social history, and Family History without any changes on review.  Physical exam:   Blood pressure (!) 159/101, pulse 65, temperature 98.4 F (36.9 C), temperature source Temporal, resp. rate 18, height 5\' 9"  (1.753 m), weight 183 lb 8 oz (83.2 kg), SpO2 96 %.    ECOG: 0   General appearance: Comfortable appearing without any discomfort Head: Normocephalic without any trauma Oropharynx: Mucous membranes are moist and pink without any thrush or ulcers. Eyes: Pupils are equal and round reactive to light. Lymph nodes: No cervical, supraclavicular, inguinal or axillary lymphadenopathy.   Heart:regular rate and rhythm.  S1 and S2 without leg edema. Lung: Clear without any rhonchi or wheezes.  No dullness to percussion. Abdomin: Soft, nontender, nondistended with good bowel sounds.  No hepatosplenomegaly. Musculoskeletal: No joint deformity or effusion.  Full range of motion noted. Neurological: No deficits noted on motor, sensory and deep tendon reflex exam. Skin: No petechial rash or dryness.  Appeared moist.         Lab Results: Lab Results  Component Value Date   WBC 6.9 01/26/2019   HGB 18.0 (H) 01/26/2019   HCT 56.9 (H) 01/26/2019   MCV 78.5 (L) 01/26/2019   PLT 266 01/26/2019     Chemistry      Component Value Date/Time   NA 141 01/26/2019 1149   NA 141 01/18/2015 0835   K 4.0 01/26/2019 1149   K 4.1 01/18/2015 0835   CL 104 01/26/2019 1149   CO2 27 01/26/2019 1149   CO2 25 01/18/2015 0835   BUN 20 01/26/2019 1149   BUN 21.5 01/18/2015 0835   CREATININE 1.35 (H) 01/26/2019 1149   CREATININE 1.1 01/18/2015 0835      Component Value Date/Time   CALCIUM 9.7 01/26/2019 1149   CALCIUM 9.8 01/18/2015 0835   ALKPHOS 85 01/26/2019 1149   ALKPHOS 66 01/18/2015 0835   AST 14 (L) 01/26/2019 1149   AST 15 01/18/2015 0835   ALT 10 01/26/2019 1149   ALT 16 01/18/2015 0835   BILITOT 0.6 01/26/2019 1149    BILITOT 0.91 01/18/2015 0835         Impression and Plan:  73 year old man with:    1.  Renal cell carcinoma diagnosed in April 2020.  He was found to have stage IV disease with potentially hepatic metastasis as well as involvement of the epididymis.     He is currently being treated with Pembrolizumab and axitinib with few complications.  Risks and benefits of continuing this therapy was discussed.  Dose reduction of Inlyta was considered given his hypertension as well as other complications.  After discussion today have recommended continuing axitinib at 5 mg daily at this time.  We will reevaluate in 3 weeks.  2.  Inguinal mass and pain: Resolved at this time after surgery..   3.  Secondary polycythemia: We will continue with the phlebotomy as well given his hypotension associated with Inlyta.  4.  IV access: Port-A-Cath inserted without any complications.  5.  Anxiety:  Prescription for Ativan will be available to him as needed.  Instructions how to use this medication as well as complications was reviewed.  6.  Prognosis and goals of care: Therapy remains palliative although aggressive measures are warranted given his excellent performance status.  7. Followup: In 3 weeks for repeat follow-up.  25 minutes was spent today face-to-face with the patient.  More than 50% of that time was dedicated to reviewing his disease status, treatment options as well as coordinating future plan of care.   Zola Button, MD 12/2/20209:41 AM

## 2019-02-15 NOTE — Addendum Note (Signed)
Addended by: [name] on: 02/15/2019 10:23 AM   Modules accepted: Orders

## 2019-02-15 NOTE — Patient Instructions (Signed)
[facility] Discharge Instructions for Patients Receiving Chemotherapy  Today you received the following chemotherapy agents: pembrolizumab.  To help prevent nausea and vomiting after your treatment, we encourage you to take your nausea medication as directed.   If you develop nausea and vomiting that is not controlled by your nausea medication, call the clinic.   BELOW ARE SYMPTOMS THAT SHOULD BE REPORTED IMMEDIATELY:  *FEVER GREATER THAN 100.5 F  *CHILLS WITH OR WITHOUT FEVER  NAUSEA AND VOMITING THAT IS NOT CONTROLLED WITH YOUR NAUSEA MEDICATION  *UNUSUAL SHORTNESS OF BREATH  *UNUSUAL BRUISING OR BLEEDING  TENDERNESS IN MOUTH AND THROAT WITH OR WITHOUT PRESENCE OF ULCERS  *URINARY PROBLEMS  *BOWEL PROBLEMS  UNUSUAL RASH Items with * indicate a potential emergency and should be followed up as soon as possible.  Feel free to call the clinic should you have any questions or concerns. The clinic phone number is [phone].  Please show the [facility] at check-in to the Emergency Department and triage nurse.   Therapeutic Phlebotomy Therapeutic phlebotomy is the planned removal of blood from a person's body for the purpose of treating a medical condition. The procedure is similar to donating blood. Usually, about a pint (470 mL, or 0.47 L) of blood is removed. The average adult has 9-12 pints (4.3-5.7 L) of blood in the body. Therapeutic phlebotomy may be used to treat the following medical conditions:  Hemochromatosis. This is a condition in which the blood contains too much iron.  Polycythemia vera. This is a condition in which the blood contains too many red blood cells.  Porphyria cutanea tarda. This is a disease in which an important part of hemoglobin is not made properly. It results in the buildup of abnormal amounts of porphyrins in the body.  Sickle cell disease. This is a condition in which the red blood cells form an abnormal crescent  shape rather than a round shape. Tell a health care provider about:  Any allergies you have.  All medicines you are taking, including vitamins, herbs, eye drops, creams, and over-the-counter medicines.  Any problems you or family members have had with anesthetic medicines.  Any blood disorders you have.  Any surgeries you have had.  Any medical conditions you have.  Whether you are pregnant or may be pregnant. What are the risks? Generally, this is a safe procedure. However, problems may occur, including:  Nausea or light-headedness.  Low blood pressure (hypotension).  Soreness, bleeding, swelling, or bruising at the needle insertion site.  Infection. What happens before the procedure?  Follow instructions from your health care provider about eating or drinking restrictions.  Ask your health care provider about: ? Changing or stopping your regular medicines. This is especially important if you are taking diabetes medicines or blood thinners (anticoagulants). ? Taking medicines such as aspirin and ibuprofen. These medicines can thin your blood. Do not take these medicines unless your health care provider tells you to take them. ? Taking over-the-counter medicines, vitamins, herbs, and supplements.  Wear clothing with sleeves that can be raised above the elbow.  Plan to have someone take you home from the hospital or clinic.  You may have a blood sample taken.  Your blood pressure, pulse rate, and breathing rate will be measured. What happens during the procedure?   To lower your risk of infection: ? Your health care team will wash or sanitize their hands. ? Your skin will be cleaned with an antiseptic.  You may be given  a medicine to numb the area (local anesthetic).  A tourniquet will be placed on your arm.  A needle will be inserted into one of your veins.  Tubing and a collection bag will be attached to that needle.  Blood will flow through the needle and  tubing into the collection bag.  The collection bag will be placed lower than your arm to allow gravity to help the flow of blood into the bag.  You may be asked to open and close your hand slowly and continually during the entire collection.  After the specified amount of blood has been removed from your body, the collection bag and tubing will be clamped.  The needle will be removed from your vein.  Pressure will be held on the site of the needle insertion to stop the bleeding.  A bandage (dressing) will be placed over the needle insertion site. The procedure may vary among health care providers and hospitals. What happens after the procedure?  Your blood pressure, pulse rate, and breathing rate will be measured after the procedure.  You will be encouraged to drink fluids.  Your recovery will be assessed and monitored.  You can return to your normal activities as told by your health care provider. Summary  Therapeutic phlebotomy is the planned removal of blood from a person's body for the purpose of treating a medical condition.  Therapeutic phlebotomy may be used to treat hemochromatosis, polycythemia vera, porphyria cutanea tarda, or sickle cell disease.  In the procedure, a needle is inserted and about a pint (470 mL, or 0.47 L) of blood is removed. The average adult has 9-12 pints (4.3-5.7 L) of blood in the body.  This is generally a safe procedure, but it can sometimes cause problems such as nausea, light-headedness, or low blood pressure (hypotension). This information is not intended to replace advice given to you by your health care provider. Make sure you discuss any questions you have with your health care provider. Document Released: 08/04/2010 Document Revised: 03/18/2017 Document Reviewed: 03/18/2017 Elsevier Patient Education  2020 [employer].

## 2019-02-16 ENCOUNTER — Telehealth: Payer: Self-pay | Admitting: Oncology

## 2019-02-16 NOTE — Telephone Encounter (Signed)
Scheduled appt per 12/2 los.  Spoke with pt and they are aware of the appt date and time.

## 2019-03-08 ENCOUNTER — Inpatient Hospital Stay: Payer: Medicare Other

## 2019-03-08 ENCOUNTER — Other Ambulatory Visit: Payer: Self-pay

## 2019-03-08 ENCOUNTER — Inpatient Hospital Stay (HOSPITAL_BASED_OUTPATIENT_CLINIC_OR_DEPARTMENT_OTHER): Payer: Medicare Other | Admitting: Oncology

## 2019-03-08 VITALS — BP 172/96 | HR 65 | Temp 98.2°F | Resp 18 | Wt 188.7 lb

## 2019-03-08 VITALS — BP 158/95

## 2019-03-08 DIAGNOSIS — C649 Malignant neoplasm of unspecified kidney, except renal pelvis: Secondary | ICD-10-CM

## 2019-03-08 DIAGNOSIS — E039 Hypothyroidism, unspecified: Secondary | ICD-10-CM

## 2019-03-08 DIAGNOSIS — Z5112 Encounter for antineoplastic immunotherapy: Secondary | ICD-10-CM | POA: Diagnosis not present

## 2019-03-08 DIAGNOSIS — Z95828 Presence of other vascular implants and grafts: Secondary | ICD-10-CM

## 2019-03-08 LAB — CMP (CANCER CENTER ONLY)
ALT: 12 U/L (ref 0–44)
AST: 23 U/L (ref 15–41)
Albumin: 3.8 g/dL (ref 3.5–5.0)
Alkaline Phosphatase: 70 U/L (ref 38–126)
Anion gap: 10 (ref 5–15)
BUN: 22 mg/dL (ref 8–23)
CO2: 28 mmol/L (ref 22–32)
Calcium: 9.1 mg/dL (ref 8.9–10.3)
Chloride: 105 mmol/L (ref 98–111)
Creatinine: 1.34 mg/dL — ABNORMAL HIGH (ref 0.61–1.24)
GFR, Est AFR Am: >60 mL/min (ref >60)
GFR, Estimated: 53 mL/min — ABNORMAL LOW (ref >60)
Glucose, Bld: 120 mg/dL — ABNORMAL HIGH (ref 70–99)
Potassium: 4.3 mmol/L (ref 3.5–5.1)
Sodium: 143 mmol/L (ref 135–145)
Total Bilirubin: 0.6 mg/dL (ref 0.3–1.2)
Total Protein: 7.3 g/dL (ref 6.5–8.1)

## 2019-03-08 LAB — TOTAL PROTEIN, URINE DIPSTICK: Protein, ur: NEGATIVE mg/dL

## 2019-03-08 LAB — CBC WITH DIFFERENTIAL (CANCER CENTER ONLY)
Abs Immature Granulocytes: 0 10*3/uL (ref 0.00–0.07)
Basophils Absolute: 0 10*3/uL (ref 0.0–0.1)
Basophils Relative: 1 %
Eosinophils Absolute: 0.4 10*3/uL (ref 0.0–0.5)
Eosinophils Relative: 9 %
HCT: 49.9 % (ref 39.0–52.0)
Hemoglobin: 16 g/dL (ref 13.0–17.0)
Immature Granulocytes: 0 %
Lymphocytes Relative: 30 %
Lymphs Abs: 1.5 10*3/uL (ref 0.7–4.0)
MCH: 24.5 pg — ABNORMAL LOW (ref 26.0–34.0)
MCHC: 32.1 g/dL (ref 30.0–36.0)
MCV: 76.4 fL — ABNORMAL LOW (ref 80.0–100.0)
Monocytes Absolute: 0.5 10*3/uL (ref 0.1–1.0)
Monocytes Relative: 10 %
Neutro Abs: 2.5 10*3/uL (ref 1.7–7.7)
Neutrophils Relative %: 50 %
Platelet Count: 180 10*3/uL (ref 150–400)
RBC: 6.53 MIL/uL — ABNORMAL HIGH (ref 4.22–5.81)
RDW: 16 % — ABNORMAL HIGH (ref 11.5–15.5)
WBC Count: 4.9 10*3/uL (ref 4.0–10.5)
nRBC: 0 % (ref 0.0–0.2)

## 2019-03-08 LAB — TSH: TSH: 0.7 u[IU]/mL (ref 0.320–4.118)

## 2019-03-08 MED ORDER — SODIUM CHLORIDE 0.9 % IV SOLN
200.0000 mg | Freq: Once | INTRAVENOUS | Status: AC
  Administered 2019-03-08: 200 mg via INTRAVENOUS
  Filled 2019-03-08: qty 8

## 2019-03-08 MED ORDER — SODIUM CHLORIDE 0.9% FLUSH
10.0000 mL | INTRAVENOUS | Status: DC | PRN
  Administered 2019-03-08: 10 mL
  Filled 2019-03-08: qty 10

## 2019-03-08 MED ORDER — SODIUM CHLORIDE 0.9 % IV SOLN
Freq: Once | INTRAVENOUS | Status: AC
  Filled 2019-03-08: qty 250

## 2019-03-08 MED ORDER — HEPARIN SOD (PORK) LOCK FLUSH 100 UNIT/ML IV SOLN
500.0000 [IU] | Freq: Once | INTRAVENOUS | Status: AC | PRN
  Administered 2019-03-08: 14:00:00 500 [IU]
  Filled 2019-03-08: qty 5

## 2019-03-08 MED ORDER — SODIUM CHLORIDE 0.9% FLUSH
10.0000 mL | INTRAVENOUS | Status: DC | PRN
  Administered 2019-03-08: 10 mL via INTRAVENOUS
  Filled 2019-03-08: qty 10

## 2019-03-08 NOTE — Patient Instructions (Signed)
[facility] Discharge Instructions for Patients Receiving Chemotherapy  Today you received the following chemotherapy agents:  Keytruda.  To help prevent nausea and vomiting after your treatment, we encourage you to take your nausea medication as directed.   If you develop nausea and vomiting that is not controlled by your nausea medication, call the clinic.   BELOW ARE SYMPTOMS THAT SHOULD BE REPORTED IMMEDIATELY:  *FEVER GREATER THAN 100.5 F  *CHILLS WITH OR WITHOUT FEVER  NAUSEA AND VOMITING THAT IS NOT CONTROLLED WITH YOUR NAUSEA MEDICATION  *UNUSUAL SHORTNESS OF BREATH  *UNUSUAL BRUISING OR BLEEDING  TENDERNESS IN MOUTH AND THROAT WITH OR WITHOUT PRESENCE OF ULCERS  *URINARY PROBLEMS  *BOWEL PROBLEMS  UNUSUAL RASH Items with * indicate a potential emergency and should be followed up as soon as possible.  Feel free to call the clinic should you have any questions or concerns. The clinic phone number is [phone].  Please show the CHEMO ALERT CARD at check-in to the Emergency Department and triage nurse.    

## 2019-03-08 NOTE — Progress Notes (Signed)
Hematology and Oncology Follow Up Visit  KARAC LOOKABILL [id number] December 14, 1945 73 y.o. 03/08/2019 12:13 PM [name], DO   Principle Diagnosis: 73 year old man with:  1.  Stage IV renal cell carcinoma diagnosed in April of 2020.  He subsequently developed stage IV disease with involvement in the epididymis..  2.  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  This could be related to malignancy and treatment.   Prior Therapy:   Therapeutic phlebotomy as needed.  He status post radical nephrectomy April 2020.  The final pathology showed clear cell histology invading into the perineural fat indicating stage T3a.  Tumor margins are negative.  He status post orchiectomy completed on 10/29/2018.  He final pathology showed metastatic carcinoma consistent with clear cell histology.  Current therapy:   Phlebotomy every 6 months to keep his hematocrit less than 45.  Pembrolizumab 200 mg every 3 weeks started on 01/26/2019.  He is on axitinib at 5 mg daily with dose reduction related to hypertension and poor tolerance.  Interim History:  Mr. Bill r is here for a repeat evaluation.  Since the last visit, he reports no recent complaints.  He reports less mouth pain and eating well at this time.  Does report some mild fatigue and tiredness but otherwise no other issues related to Pembrolizumab.  He tolerate axitinib once a day much better rate.  He reports his blood pressure has been close to normal range with ambulatory monitoring.  He denied headaches, blurry vision, syncope or seizures.  Denies any fevers, chills or sweats.  Denied chest pain, palpitation, orthopnea or leg edema.  Denied cough, wheezing or hemoptysis.  Denied nausea, vomiting or abdominal pain.  Denies any constipation or diarrhea.  Denies any frequency urgency or hesitancy.  Denies any arthralgias or myalgias.  Denies any skin rashes or lesions.  Denies any bleeding or clotting  tendency.  Denies any easy bruising.  Denies any hair or nail changes.  Denies any anxiety or depression.  Remaining review of system is negative.    .               Medications: Reviewed without any changes. Current Outpatient Medications  Medication Sig Dispense Refill  . axitinib (INLYTA) 5 MG tablet Take 1 tablet (5 mg total) by mouth 2 (two) times daily. Take ~12h apart with water, with or without food. 60 tablet 1  . busPIRone (BUSPAR) 5 MG tablet Take 5 mg by mouth 2 (two) times daily as needed (for anxiety).    . fluticasone (FLONASE) 50 MCG/ACT nasal spray Place 1 spray into both nostrils daily as needed for allergies.     Marland Kitchen lidocaine-prilocaine (EMLA) cream Apply 1 application topically as needed. 30 g 0  . LORazepam (ATIVAN) 1 MG tablet Take 1 tablet (1 mg total) by mouth every 8 (eight) hours. 30 tablet 0  . losartan-hydrochlorothiazide (HYZAAR) 100-25 MG tablet Take 1 tablet by mouth daily.    . meloxicam (MOBIC) 15 MG tablet Take 15 mg by mouth as needed for pain.    Marland Kitchen oxyCODONE-acetaminophen (PERCOCET) 5-325 MG tablet Take 1-2 tablets by mouth every 8 (eight) hours as needed for moderate pain or severe pain. Post-operatively 20 tablet 0  . pravastatin (PRAVACHOL) 40 MG tablet Take 40 mg by mouth daily.    . prochlorperazine (COMPAZINE) 10 MG tablet Take 1 tablet (10 mg total) by mouth every 6 (six) hours as needed for nausea or vomiting. 30 tablet 0   No current  facility-administered medications for this visit.   Facility-Administered Medications Ordered in Other Visits  Medication Dose Route Frequency Provider Last Rate Last Admin  . sodium chloride flush (NS) 0.9 % injection 10 mL  10 mL Intravenous PRN [name], MD   10 mL at 03/08/19 1203     Allergies:  Allergies  Allergen Reactions  . Oxycodone Other (See Comments)    Patient stated,"it keeps me wide awake and I can't sleep."  . Septra [Sulfamethoxazole-Trimethoprim] Hives and Itching     Past Medical History, Surgical history, Social history, and Family History updated without any changes.  Physical exam:  Blood pressure (!) 172/96, pulse 65, temperature 98.2 F (36.8 C), temperature source Temporal, resp. rate 18, weight 188 lb 11.2 oz (85.6 kg), SpO2 98 %.      ECOG: 0     General appearance: Alert, awake without any distress. Head: Atraumatic without abnormalities Oropharynx: Without any thrush or ulcers. Eyes: No scleral icterus. Lymph nodes: No lymphadenopathy noted in the cervical, supraclavicular, or axillary nodes Heart:regular rate and rhythm, without any murmurs or gallops.   Lung: Clear to auscultation without any rhonchi, wheezes or dullness to percussion. Abdomin: Soft, nontender without any shifting dullness or ascites. Musculoskeletal: No clubbing or cyanosis. Neurological: No motor or sensory deficits. Skin: No rashes or lesions. Psychiatric: Mood and affect appeared normal.         Lab Results: Lab Results  Component Value Date   WBC 6.8 02/15/2019   HGB 18.7 (H) 02/15/2019   HCT 58.4 (H) 02/15/2019   MCV 77.0 (L) 02/15/2019   PLT 170 02/15/2019     Chemistry      Component Value Date/Time   NA 140 02/15/2019 0930   NA 141 01/18/2015 0835   K 3.9 02/15/2019 0930   K 4.1 01/18/2015 0835   CL 104 02/15/2019 0930   CO2 25 02/15/2019 0930   CO2 25 01/18/2015 0835   BUN 26 (H) 02/15/2019 0930   BUN 21.5 01/18/2015 0835   CREATININE 1.46 (H) 02/15/2019 0930   CREATININE 1.1 01/18/2015 0835      Component Value Date/Time   CALCIUM 9.3 02/15/2019 0930   CALCIUM 9.8 01/18/2015 0835   ALKPHOS 87 02/15/2019 0930   ALKPHOS 66 01/18/2015 0835   AST 24 02/15/2019 0930   AST 15 01/18/2015 0835   ALT 19 02/15/2019 0930   ALT 16 01/18/2015 0835   BILITOT 0.6 02/15/2019 0930   BILITOT 0.91 01/18/2015 0835         Impression and Plan:  73 year old man with:    1.  Stage IV renal cell carcinoma after nephrectomy  with recurrence around the nephrectomy bed, psoas muscle involvement as well as epididymal involvement.   He remains on systemic treatment with a reduced dose axitinib at 5 mg daily with Pembrolizumab with better tolerance.  Risks and benefits of continuing this approach was reviewed at this time.  Potential long-term complications were reiterated.  Plan is to repeat imaging studies in January 2020 after the next cycle of therapy.  2.  Hypertension: He has been checking blood pressure on a regular basis with multiple measurements today.  His blood pressure has been within normal range ranging systolic between [id number] and [id number] and diastolic around 70.  His blood pressure is elevated today which has been consistent prior to Inlyta.  For the time being I will not adjust any of his blood pressure medication or Inlyta dosing.  We will continue to monitor.  3.  Secondary polycythemia: His hemoglobin is 16 today and does not require any phlebotomy at this time.  4.  IV access: Port-A-Cath remains in use without any issues.  5.  Anxiety: Manageable at this time with Ativan.  6.  Prognosis and goals of care: Disease is incurable although aggressive measures are warranted at this time.  Performance status is excellent.  7. Followup: He will return in 3 weeks for repeat evaluation.  25 minutes was spent today face-to-face with the patient.  More than 50% of that time was spent on reviewing his disease status, treatment options and addressing complications related to therapy.  Zola Button, MD 12/23/202012:13 PM

## 2019-03-09 ENCOUNTER — Telehealth: Payer: Self-pay | Admitting: Oncology

## 2019-03-09 NOTE — Telephone Encounter (Signed)
Scheduled appt per 12/23 los.  Patient will get a print out at their next scheduled appt.

## 2019-03-15 ENCOUNTER — Ambulatory Visit: Payer: Medicare Other | Attending: Internal Medicine

## 2019-03-15 DIAGNOSIS — Z20822 Contact with and (suspected) exposure to covid-19: Secondary | ICD-10-CM

## 2019-03-16 LAB — NOVEL CORONAVIRUS, NAA: SARS-CoV-2, NAA: NOT DETECTED

## 2019-03-29 ENCOUNTER — Inpatient Hospital Stay: Payer: Medicare Other | Attending: Oncology

## 2019-03-29 ENCOUNTER — Telehealth: Payer: Self-pay | Admitting: Oncology

## 2019-03-29 ENCOUNTER — Inpatient Hospital Stay: Payer: Medicare Other

## 2019-03-29 ENCOUNTER — Inpatient Hospital Stay (HOSPITAL_BASED_OUTPATIENT_CLINIC_OR_DEPARTMENT_OTHER): Payer: Medicare Other | Admitting: Oncology

## 2019-03-29 ENCOUNTER — Other Ambulatory Visit: Payer: Self-pay

## 2019-03-29 VITALS — BP 143/94 | HR 80 | Temp 98.0°F | Resp 17 | Ht 69.0 in | Wt 186.4 lb

## 2019-03-29 DIAGNOSIS — Z5112 Encounter for antineoplastic immunotherapy: Secondary | ICD-10-CM | POA: Diagnosis not present

## 2019-03-29 DIAGNOSIS — Z79899 Other long term (current) drug therapy: Secondary | ICD-10-CM | POA: Diagnosis not present

## 2019-03-29 DIAGNOSIS — Z95828 Presence of other vascular implants and grafts: Secondary | ICD-10-CM

## 2019-03-29 DIAGNOSIS — E079 Disorder of thyroid, unspecified: Secondary | ICD-10-CM | POA: Insufficient documentation

## 2019-03-29 DIAGNOSIS — D751 Secondary polycythemia: Secondary | ICD-10-CM | POA: Diagnosis not present

## 2019-03-29 DIAGNOSIS — Z905 Acquired absence of kidney: Secondary | ICD-10-CM | POA: Diagnosis not present

## 2019-03-29 DIAGNOSIS — I10 Essential (primary) hypertension: Secondary | ICD-10-CM | POA: Insufficient documentation

## 2019-03-29 DIAGNOSIS — Z791 Long term (current) use of non-steroidal anti-inflammatories (NSAID): Secondary | ICD-10-CM | POA: Insufficient documentation

## 2019-03-29 DIAGNOSIS — Z9079 Acquired absence of other genital organ(s): Secondary | ICD-10-CM | POA: Diagnosis not present

## 2019-03-29 DIAGNOSIS — C649 Malignant neoplasm of unspecified kidney, except renal pelvis: Secondary | ICD-10-CM | POA: Insufficient documentation

## 2019-03-29 DIAGNOSIS — C7982 Secondary malignant neoplasm of genital organs: Secondary | ICD-10-CM | POA: Insufficient documentation

## 2019-03-29 DIAGNOSIS — N2889 Other specified disorders of kidney and ureter: Secondary | ICD-10-CM

## 2019-03-29 DIAGNOSIS — R918 Other nonspecific abnormal finding of lung field: Secondary | ICD-10-CM

## 2019-03-29 LAB — CMP (CANCER CENTER ONLY)
ALT: 11 U/L (ref 0–44)
AST: 18 U/L (ref 15–41)
Albumin: 4.1 g/dL (ref 3.5–5.0)
Alkaline Phosphatase: 69 U/L (ref 38–126)
Anion gap: 13 (ref 5–15)
BUN: 20 mg/dL (ref 8–23)
CO2: 26 mmol/L (ref 22–32)
Calcium: 9.4 mg/dL (ref 8.9–10.3)
Chloride: 102 mmol/L (ref 98–111)
Creatinine: 1.54 mg/dL — ABNORMAL HIGH (ref 0.61–1.24)
GFR, Est AFR Am: 51 mL/min — ABNORMAL LOW (ref >60)
GFR, Estimated: 44 mL/min — ABNORMAL LOW (ref >60)
Glucose, Bld: 91 mg/dL (ref 70–99)
Potassium: 4.2 mmol/L (ref 3.5–5.1)
Sodium: 141 mmol/L (ref 135–145)
Total Bilirubin: 0.7 mg/dL (ref 0.3–1.2)
Total Protein: 7.9 g/dL (ref 6.5–8.1)

## 2019-03-29 LAB — CBC WITH DIFFERENTIAL (CANCER CENTER ONLY)
Abs Immature Granulocytes: 0.02 10*3/uL (ref 0.00–0.07)
Basophils Absolute: 0.1 10*3/uL (ref 0.0–0.1)
Basophils Relative: 1 %
Eosinophils Absolute: 0.3 10*3/uL (ref 0.0–0.5)
Eosinophils Relative: 6 %
HCT: 55 % — ABNORMAL HIGH (ref 39.0–52.0)
Hemoglobin: 18.1 g/dL — ABNORMAL HIGH (ref 13.0–17.0)
Immature Granulocytes: 0 %
Lymphocytes Relative: 29 %
Lymphs Abs: 1.7 10*3/uL (ref 0.7–4.0)
MCH: 24.9 pg — ABNORMAL LOW (ref 26.0–34.0)
MCHC: 32.9 g/dL (ref 30.0–36.0)
MCV: 75.7 fL — ABNORMAL LOW (ref 80.0–100.0)
Monocytes Absolute: 0.6 10*3/uL (ref 0.1–1.0)
Monocytes Relative: 10 %
Neutro Abs: 3.2 10*3/uL (ref 1.7–7.7)
Neutrophils Relative %: 54 %
Platelet Count: 193 10*3/uL (ref 150–400)
RBC: 7.27 MIL/uL — ABNORMAL HIGH (ref 4.22–5.81)
RDW: 17.2 % — ABNORMAL HIGH (ref 11.5–15.5)
WBC Count: 5.9 10*3/uL (ref 4.0–10.5)
nRBC: 0 % (ref 0.0–0.2)

## 2019-03-29 MED ORDER — SODIUM CHLORIDE 0.9 % IV SOLN
200.0000 mg | Freq: Once | INTRAVENOUS | Status: AC
  Administered 2019-03-29: 200 mg via INTRAVENOUS
  Filled 2019-03-29: qty 8

## 2019-03-29 MED ORDER — HEPARIN SOD (PORK) LOCK FLUSH 100 UNIT/ML IV SOLN
500.0000 [IU] | Freq: Once | INTRAVENOUS | Status: AC | PRN
  Administered 2019-03-29: 500 [IU]
  Filled 2019-03-29: qty 5

## 2019-03-29 MED ORDER — SODIUM CHLORIDE 0.9% FLUSH
10.0000 mL | Freq: Once | INTRAVENOUS | Status: AC
  Administered 2019-03-29: 10 mL via INTRAVENOUS
  Filled 2019-03-29: qty 10

## 2019-03-29 MED ORDER — SODIUM CHLORIDE 0.9% FLUSH
10.0000 mL | INTRAVENOUS | Status: DC | PRN
  Administered 2019-03-29: 10 mL
  Filled 2019-03-29: qty 10

## 2019-03-29 MED ORDER — SODIUM CHLORIDE 0.9 % IV SOLN
Freq: Once | INTRAVENOUS | Status: AC
  Filled 2019-03-29: qty 250

## 2019-03-29 NOTE — Progress Notes (Signed)
Hematology and Oncology Follow Up Visit  [name] [id number] 09/22/1945 74 y.o. 03/29/2019 12:14 PM [name], DO   Principle Diagnosis: 74 year old man with:  1.  Renal cell carcinoma diagnosed in 2020.  He subsequently developed stage IV disease involving the epididymis as well as the nephrectomy bed.   2. 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     Prior Therapy:   Therapeutic phlebotomy as needed.  He status post radical nephrectomy April 2020.  The final pathology showed clear cell histology invading into the perineural fat indicating stage T3a.  Tumor margins are negative.  He status post orchiectomy completed on 10/29/2018.  He final pathology showed metastatic carcinoma consistent with clear cell histology.  Current therapy:   Phlebotomy every 6 months to keep his hematocrit less than 45.  Pembrolizumab 200 mg every 3 weeks started on 01/26/2019.  Axitinib 5 mg daily with dose adjustment made in December 2020 for better tolerance.  Interim History:  [name] is here for a follow-up evaluation.  Since the last visit, he reports no major changes in his health.  He does have occasional sinus drainage but no infection or fevers.  He denies any complications related to axitinib or Pembrolizumab.  He does report some fatigue tiredness which is manageable at this time.  His appetite and performance status remain excellent at this time.    .               Medications: Reviewed without any changes. Current Outpatient Medications  Medication Sig Dispense Refill  . axitinib (INLYTA) 5 MG tablet Take 1 tablet (5 mg total) by mouth 2 (two) times daily. Take ~12h apart with water, with or without food. 60 tablet 1  . busPIRone (BUSPAR) 5 MG tablet Take 5 mg by mouth 2 (two) times daily as needed (for anxiety).    . fluticasone (FLONASE) 50 MCG/ACT nasal spray Place 1 spray into both nostrils daily as  needed for allergies.     Marland Kitchen lidocaine-prilocaine (EMLA) cream Apply 1 application topically as needed. 30 g 0  . LORazepam (ATIVAN) 1 MG tablet Take 1 tablet (1 mg total) by mouth every 8 (eight) hours. 30 tablet 0  . losartan-hydrochlorothiazide (HYZAAR) 100-25 MG tablet Take 1 tablet by mouth daily.    . meloxicam (MOBIC) 15 MG tablet Take 15 mg by mouth as needed for pain.    Marland Kitchen oxyCODONE-acetaminophen (PERCOCET) 5-325 MG tablet Take 1-2 tablets by mouth every 8 (eight) hours as needed for moderate pain or severe pain. Post-operatively 20 tablet 0  . pravastatin (PRAVACHOL) 40 MG tablet Take 40 mg by mouth daily.    . prochlorperazine (COMPAZINE) 10 MG tablet Take 1 tablet (10 mg total) by mouth every 6 (six) hours as needed for nausea or vomiting. 30 tablet 0   No current facility-administered medications for this visit.     Allergies:  Allergies  Allergen Reactions  . Oxycodone Other (See Comments)    Patient stated,"it keeps me wide awake and I can't sleep."  . Septra [Sulfamethoxazole-Trimethoprim] Hives and Itching      Physical exam:  Blood pressure (!) 143/94, pulse 80, temperature 98 F (36.7 C), temperature source Temporal, resp. rate 17, height 5\' 9"  (1.753 m), weight 186 lb 6.4 oz (84.6 kg), SpO2 96 %.      ECOG: 0    General appearance: Comfortable appearing without any discomfort Head: Normocephalic without any trauma Oropharynx: Mucous membranes are moist and pink  without any thrush or ulcers. Eyes: Pupils are equal and round reactive to light. Lymph nodes: No cervical, supraclavicular, inguinal or axillary lymphadenopathy.   Heart:regular rate and rhythm.  S1 and S2 without leg edema. Lung: Clear without any rhonchi or wheezes.  No dullness to percussion. Abdomin: Soft, nontender, nondistended with good bowel sounds.  No hepatosplenomegaly. Musculoskeletal: No joint deformity or effusion.  Full range of motion noted. Neurological: No deficits noted on  motor, sensory and deep tendon reflex exam. Skin: No petechial rash or dryness.  Appeared moist.           Lab Results: Lab Results  Component Value Date   WBC 5.9 03/29/2019   HGB 18.1 (H) 03/29/2019   HCT 55.0 (H) 03/29/2019   MCV 75.7 (L) 03/29/2019   PLT 193 03/29/2019     Chemistry      Component Value Date/Time   NA 143 03/08/2019 1203   NA 141 01/18/2015 0835   K 4.3 03/08/2019 1203   K 4.1 01/18/2015 0835   CL 105 03/08/2019 1203   CO2 28 03/08/2019 1203   CO2 25 01/18/2015 0835   BUN 22 03/08/2019 1203   BUN 21.5 01/18/2015 0835   CREATININE 1.34 (H) 03/08/2019 1203   CREATININE 1.1 01/18/2015 0835      Component Value Date/Time   CALCIUM 9.1 03/08/2019 1203   CALCIUM 9.8 01/18/2015 0835   ALKPHOS 70 03/08/2019 1203   ALKPHOS 66 01/18/2015 0835   AST 23 03/08/2019 1203   AST 15 01/18/2015 0835   ALT 12 03/08/2019 1203   ALT 16 01/18/2015 0835   BILITOT 0.6 03/08/2019 1203   BILITOT 0.91 01/18/2015 0835         Impression and Plan:  74 year old man with:    1.  Renal cell carcinoma diagnosed in 2020.  He subsequently developed stage IV disease with epididymal involvement as well as involvement of his nephrectomy bed.   He has tolerated Inlyta as well as Prempro without any major complications especially at the reduced dose of Inlyta.  Risks and benefits of continuing this approach was discussed today.  Potential complications including hypertension, diarrhea as well as immune mediated complications were reviewed.  At this time is agreeable to continue with the plan is to repeat imaging studies before the next visit.  2.  Hypertension: His blood pressure is manageable at this time improved with a reduced dose of Inlyta.  We will continue to monitor forward.   3.  Secondary polycythemia: He will receive phlebotomy to keep his hematocrit below 45.  4.  IV access: Port-A-Cath currently in use without any issues.  5.  Work related  considerations: He is considering going back to work at this time although he is worried about exposure.  We have discussed strategies to mitigate his risk of COVID-19 infection and I recommended holding off at this time until he is vaccinated and treatment for his cancer has been fully tolerable.  6.  Prognosis and goals of care: Family therapy remains palliative at this time although aggressive measures are warranted given his excellent performance status.  7.  Immune mediated complications: Continue to educate him about potential issues.  These include pneumonitis, colitis and thyroid disease.  8. Followup: In 3 weeks for repeat evaluation.  30 minutes was spent on this encounter.  Time was dedicated to reviewing his disease status, addressing complications related to his cancer therapy as well as coordinating future plan of care.   Zola Button, MD 1/13/202112:14  PM

## 2019-03-29 NOTE — Patient Instructions (Signed)

## 2019-03-29 NOTE — Progress Notes (Signed)
OK to treat with creatinine level and labs today per MD shadad

## 2019-03-29 NOTE — Telephone Encounter (Signed)
Scheduled appt per 1/13 los

## 2019-04-03 ENCOUNTER — Other Ambulatory Visit: Payer: Medicare Other

## 2019-04-12 ENCOUNTER — Encounter (HOSPITAL_COMMUNITY): Payer: Self-pay

## 2019-04-12 ENCOUNTER — Ambulatory Visit (HOSPITAL_COMMUNITY)
Admission: RE | Admit: 2019-04-12 | Discharge: 2019-04-12 | Disposition: A | Discharge status: Home / Self Care | Payer: Medicare Other | Source: Ambulatory Visit | Attending: Oncology | Admitting: Oncology

## 2019-04-12 ENCOUNTER — Other Ambulatory Visit: Payer: Self-pay

## 2019-04-12 DIAGNOSIS — C649 Malignant neoplasm of unspecified kidney, except renal pelvis: Secondary | ICD-10-CM | POA: Diagnosis not present

## 2019-04-12 DIAGNOSIS — N2889 Other specified disorders of kidney and ureter: Secondary | ICD-10-CM

## 2019-04-12 DIAGNOSIS — C641 Malignant neoplasm of right kidney, except renal pelvis: Secondary | ICD-10-CM | POA: Diagnosis not present

## 2019-04-12 DIAGNOSIS — R918 Other nonspecific abnormal finding of lung field: Secondary | ICD-10-CM | POA: Diagnosis not present

## 2019-04-12 MED ORDER — IOHEXOL 300 MG/ML  SOLN
75.0000 mL | Freq: Once | INTRAMUSCULAR | Status: AC | PRN
  Administered 2019-04-12: 08:00:00 75 mL via INTRAVENOUS

## 2019-04-12 MED ORDER — SODIUM CHLORIDE (PF) 0.9 % IJ SOLN
INTRAMUSCULAR | Status: AC
  Filled 2019-04-12: qty 50

## 2019-04-19 ENCOUNTER — Other Ambulatory Visit: Payer: Self-pay

## 2019-04-19 ENCOUNTER — Inpatient Hospital Stay: Payer: Medicare Other | Attending: Oncology

## 2019-04-19 ENCOUNTER — Inpatient Hospital Stay: Payer: Medicare Other

## 2019-04-19 ENCOUNTER — Inpatient Hospital Stay (HOSPITAL_BASED_OUTPATIENT_CLINIC_OR_DEPARTMENT_OTHER): Payer: Medicare Other | Admitting: Oncology

## 2019-04-19 ENCOUNTER — Telehealth: Payer: Self-pay | Admitting: Oncology

## 2019-04-19 VITALS — BP 147/89 | HR 69 | Temp 98.1°F | Resp 18 | Wt 186.6 lb

## 2019-04-19 DIAGNOSIS — I10 Essential (primary) hypertension: Secondary | ICD-10-CM | POA: Diagnosis not present

## 2019-04-19 DIAGNOSIS — Z791 Long term (current) use of non-steroidal anti-inflammatories (NSAID): Secondary | ICD-10-CM | POA: Insufficient documentation

## 2019-04-19 DIAGNOSIS — Z5112 Encounter for antineoplastic immunotherapy: Secondary | ICD-10-CM | POA: Diagnosis not present

## 2019-04-19 DIAGNOSIS — I2510 Atherosclerotic heart disease of native coronary artery without angina pectoris: Secondary | ICD-10-CM | POA: Diagnosis not present

## 2019-04-19 DIAGNOSIS — Z905 Acquired absence of kidney: Secondary | ICD-10-CM | POA: Diagnosis not present

## 2019-04-19 DIAGNOSIS — C649 Malignant neoplasm of unspecified kidney, except renal pelvis: Secondary | ICD-10-CM | POA: Diagnosis not present

## 2019-04-19 DIAGNOSIS — E039 Hypothyroidism, unspecified: Secondary | ICD-10-CM

## 2019-04-19 DIAGNOSIS — Z79899 Other long term (current) drug therapy: Secondary | ICD-10-CM | POA: Insufficient documentation

## 2019-04-19 DIAGNOSIS — Z9079 Acquired absence of other genital organ(s): Secondary | ICD-10-CM | POA: Diagnosis not present

## 2019-04-19 DIAGNOSIS — C7982 Secondary malignant neoplasm of genital organs: Secondary | ICD-10-CM | POA: Insufficient documentation

## 2019-04-19 DIAGNOSIS — D751 Secondary polycythemia: Secondary | ICD-10-CM | POA: Insufficient documentation

## 2019-04-19 DIAGNOSIS — C787 Secondary malignant neoplasm of liver and intrahepatic bile duct: Secondary | ICD-10-CM | POA: Insufficient documentation

## 2019-04-19 LAB — CMP (CANCER CENTER ONLY)
ALT: 14 U/L (ref 0–44)
AST: 18 U/L (ref 15–41)
Albumin: 4.1 g/dL (ref 3.5–5.0)
Alkaline Phosphatase: 62 U/L (ref 38–126)
Anion gap: 6 (ref 5–15)
BUN: 21 mg/dL (ref 8–23)
CO2: 27 mmol/L (ref 22–32)
Calcium: 9.3 mg/dL (ref 8.9–10.3)
Chloride: 106 mmol/L (ref 98–111)
Creatinine: 1.44 mg/dL — ABNORMAL HIGH (ref 0.61–1.24)
GFR, Est AFR Am: 56 mL/min — ABNORMAL LOW (ref >60)
GFR, Estimated: 48 mL/min — ABNORMAL LOW (ref >60)
Glucose, Bld: 97 mg/dL (ref 70–99)
Potassium: 4 mmol/L (ref 3.5–5.1)
Sodium: 139 mmol/L (ref 135–145)
Total Bilirubin: 0.6 mg/dL (ref 0.3–1.2)
Total Protein: 7.5 g/dL (ref 6.5–8.1)

## 2019-04-19 LAB — CBC WITH DIFFERENTIAL (CANCER CENTER ONLY)
Abs Immature Granulocytes: 0.02 10*3/uL (ref 0.00–0.07)
Basophils Absolute: 0.1 10*3/uL (ref 0.0–0.1)
Basophils Relative: 1 %
Eosinophils Absolute: 0.4 10*3/uL (ref 0.0–0.5)
Eosinophils Relative: 6 %
HCT: 53.4 % — ABNORMAL HIGH (ref 39.0–52.0)
Hemoglobin: 17.8 g/dL — ABNORMAL HIGH (ref 13.0–17.0)
Immature Granulocytes: 0 %
Lymphocytes Relative: 28 %
Lymphs Abs: 1.7 10*3/uL (ref 0.7–4.0)
MCH: 26.4 pg (ref 26.0–34.0)
MCHC: 33.3 g/dL (ref 30.0–36.0)
MCV: 79.1 fL — ABNORMAL LOW (ref 80.0–100.0)
Monocytes Absolute: 0.6 10*3/uL (ref 0.1–1.0)
Monocytes Relative: 10 %
Neutro Abs: 3.3 10*3/uL (ref 1.7–7.7)
Neutrophils Relative %: 55 %
Platelet Count: 224 10*3/uL (ref 150–400)
RBC: 6.75 MIL/uL — ABNORMAL HIGH (ref 4.22–5.81)
RDW: 18 % — ABNORMAL HIGH (ref 11.5–15.5)
WBC Count: 6 10*3/uL (ref 4.0–10.5)
nRBC: 0 % (ref 0.0–0.2)

## 2019-04-19 LAB — TOTAL PROTEIN, URINE DIPSTICK: Protein, ur: NEGATIVE mg/dL

## 2019-04-19 LAB — TSH: TSH: 1.767 u[IU]/mL (ref 0.320–4.118)

## 2019-04-19 MED ORDER — HEPARIN SOD (PORK) LOCK FLUSH 100 UNIT/ML IV SOLN
500.0000 [IU] | Freq: Once | INTRAVENOUS | Status: AC | PRN
  Administered 2019-04-19: 15:00:00 500 [IU]
  Filled 2019-04-19: qty 5

## 2019-04-19 MED ORDER — SODIUM CHLORIDE 0.9% FLUSH
10.0000 mL | INTRAVENOUS | Status: DC | PRN
  Administered 2019-04-19: 10 mL
  Filled 2019-04-19: qty 10

## 2019-04-19 MED ORDER — SODIUM CHLORIDE 0.9 % IV SOLN
Freq: Once | INTRAVENOUS | Status: AC
  Filled 2019-04-19: qty 250

## 2019-04-19 MED ORDER — SODIUM CHLORIDE 0.9 % IV SOLN
200.0000 mg | Freq: Once | INTRAVENOUS | Status: AC
  Administered 2019-04-19: 200 mg via INTRAVENOUS
  Filled 2019-04-19: qty 8

## 2019-04-19 NOTE — Patient Instructions (Signed)

## 2019-04-19 NOTE — Patient Instructions (Signed)
[facility] Discharge Instructions for Patients Receiving Chemotherapy  Today you received the following chemotherapy agents:  Keytruda.  To help prevent nausea and vomiting after your treatment, we encourage you to take your nausea medication as directed.   If you develop nausea and vomiting that is not controlled by your nausea medication, call the clinic.   BELOW ARE SYMPTOMS THAT SHOULD BE REPORTED IMMEDIATELY:  *FEVER GREATER THAN 100.5 F  *CHILLS WITH OR WITHOUT FEVER  NAUSEA AND VOMITING THAT IS NOT CONTROLLED WITH YOUR NAUSEA MEDICATION  *UNUSUAL SHORTNESS OF BREATH  *UNUSUAL BRUISING OR BLEEDING  TENDERNESS IN MOUTH AND THROAT WITH OR WITHOUT PRESENCE OF ULCERS  *URINARY PROBLEMS  *BOWEL PROBLEMS  UNUSUAL RASH Items with * indicate a potential emergency and should be followed up as soon as possible.  Feel free to call the clinic should you have any questions or concerns. The clinic phone number is [phone].  Please show the CHEMO ALERT CARD at check-in to the Emergency Department and triage nurse.    

## 2019-04-19 NOTE — Progress Notes (Signed)
Patient presented today for phlebotomy per MD order. 100 mls was removed from port and then slowed significantly.  Still able to get a brisk blood return before Keytruda.    18 gauge needle was placed in Left AC to finish the phlebotomy. 431mls. Removed. Catheter was removed intact. Beverage offered. Patient d/c in stable condition.

## 2019-04-19 NOTE — Telephone Encounter (Signed)
Scheduled appt per 2/3 los

## 2019-04-19 NOTE — Progress Notes (Signed)
Hematology and Oncology Follow Up Visit  [name] [id number] 1945-03-23 74 y.o. 04/19/2019 11:59 AM [name] Russell, DO   Principle Diagnosis: 74 year old man with:  1. 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     2. 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   Prior Therapy:   Therapeutic phlebotomy as needed.  He status post radical nephrectomy April 2020.  The final pathology showed clear cell histology invading into the perineural fat indicating stage T3a.  Tumor margins are negative.  He status post orchiectomy completed on 10/29/2018.  He final pathology showed metastatic carcinoma consistent with clear cell histology.  Current therapy:   Phlebotomy every 6 months to keep his hematocrit less than 45.  Pembrolizumab 200 mg every 3 weeks started on 01/26/2019.  Axitinib 5 mg daily since December 2020 due to side effects.  Interim History:  [name] returns today for evaluation.  Since the last visit, he reports no major changes in his health.  He continues to tolerate therapy without any major complaints.  He does report some GI discomfort related to axitinib.  He feels indigestion at times and loose bowel habits but no diarrhea.  Denies any nausea or vomiting or weight loss.  Denies any excessive fatigue or tiredness.    .               Medications: Updated on review.. Current Outpatient Medications  Medication Sig Dispense Refill  . axitinib (INLYTA) 5 MG tablet Take 1 tablet (5 mg total) by mouth 2 (two) times daily. Take ~12h apart with water, with or without food. 60 tablet 1  . busPIRone (BUSPAR) 5 MG tablet Take 5 mg by mouth 2 (two) times daily as needed (for anxiety).    . fluticasone (FLONASE) 50 MCG/ACT nasal spray Place 1 spray into both nostrils daily as needed for allergies.     Marland Kitchen lidocaine-prilocaine (EMLA)  cream Apply 1 application topically as needed. 30 g 0  . LORazepam (ATIVAN) 1 MG tablet Take 1 tablet (1 mg total) by mouth every 8 (eight) hours. 30 tablet 0  . losartan-hydrochlorothiazide (HYZAAR) 100-25 MG tablet Take 1 tablet by mouth daily.    . meloxicam (MOBIC) 15 MG tablet Take 15 mg by mouth as needed for pain.    Marland Kitchen oxyCODONE-acetaminophen (PERCOCET) 5-325 MG tablet Take 1-2 tablets by mouth every 8 (eight) hours as needed for moderate pain or severe pain. Post-operatively 20 tablet 0  . pravastatin (PRAVACHOL) 40 MG tablet Take 40 mg by mouth daily.    . prochlorperazine (COMPAZINE) 10 MG tablet Take 1 tablet (10 mg total) by mouth every 6 (six) hours as needed for nausea or vomiting. 30 tablet 0   No current facility-administered medications for this visit.     Allergies:  Allergies  Allergen Reactions  . Oxycodone Other (See Comments)    Patient stated,"it keeps me wide awake and I can't sleep."  . Septra [Sulfamethoxazole-Trimethoprim] Hives and Itching      Physical exam:    Blood pressure (!) 147/89, pulse 69, temperature 98.1 F (36.7 C), temperature source Temporal, resp. rate 18, weight 186 lb 9.6 oz (84.6 kg), SpO2 99 %.      ECOG: 0   General appearance: Alert, awake without any distress. Head: Atraumatic without abnormalities Oropharynx: Without any thrush or ulcers. Eyes: No scleral icterus. Lymph nodes: No lymphadenopathy noted in the cervical,  supraclavicular, or axillary nodes Heart:regular rate and rhythm, without any murmurs or gallops.   Lung: Clear to auscultation without any rhonchi, wheezes or dullness to percussion. Abdomin: Soft, nontender without any shifting dullness or ascites. Musculoskeletal: No clubbing or cyanosis. Neurological: No motor or sensory deficits. Skin: No rashes or lesions.           Lab Results: Lab Results  Component Value Date   WBC 5.9 03/29/2019   HGB 18.1 (H) 03/29/2019   HCT 55.0 (H) 03/29/2019    MCV 75.7 (L) 03/29/2019   PLT 193 03/29/2019     Chemistry      Component Value Date/Time   NA 141 03/29/2019 1135   NA 141 01/18/2015 0835   K 4.2 03/29/2019 1135   K 4.1 01/18/2015 0835   CL 102 03/29/2019 1135   CO2 26 03/29/2019 1135   CO2 25 01/18/2015 0835   BUN 20 03/29/2019 1135   BUN 21.5 01/18/2015 0835   CREATININE 1.54 (H) 03/29/2019 1135   CREATININE 1.1 01/18/2015 0835      Component Value Date/Time   CALCIUM 9.4 03/29/2019 1135   CALCIUM 9.8 01/18/2015 0835   ALKPHOS 69 03/29/2019 1135   ALKPHOS 66 01/18/2015 0835   AST 18 03/29/2019 1135   AST 15 01/18/2015 0835   ALT 11 03/29/2019 1135   ALT 16 01/18/2015 0835   BILITOT 0.7 03/29/2019 1135   BILITOT 0.91 01/18/2015 0835      IMPRESSION: 1. Redemonstrated postoperative findings of right nephrectomy and right orchiectomy.  2. Significant interval decrease in arterial contrast enhancement and size of a hyperenhancing lesion of the anterior inferior right lobe of the liver when compared to prior CT of the abdomen dated 01/02/2019, hepatic segment VI, now hypoenhancing and difficult to distinguish, measuring approximately 7 mm, previously 1.7 x 1.4 cm (series 9, image 106).  3. Nodular FDG avid soft tissue overlying the right psoas in the vicinity of the proximal ureter or right testicular vein seen on prior PET-CT dated 11/18/2018 is resolved (series 9, image 135). FDG avid nodular soft tissue in the right lower quadrant, generally along the course of the right testicular vein is significantly reduced in size compared to prior PET-CT dated 11/18/2018, a residual nodule lying over the inferior right psoas measuring 1.0 x 0.8 cm, previously 2.4 x 1.7 cm (series 9, image 164).  4. Overall findings above are consistent with treatment response of metastatic disease. Please note that given the presence of established metastatic disease in the right lower quadrant below the iliac crests, not included  in prior CT examination of the abdomen dated 01/02/2019, recommend consistent follow-up imaging of the abdomen and pelvis to ensure that all metastatic disease is assessed.  5. Stable small retroperitoneal lymph nodes. Attention on follow-up.  6. No evidence of metastatic disease in the chest. There is a small, likely incidental and benign 3 mm nodule of the superior segment of the left lower lobe. Attention on follow-up.  7.  Emphysema (ICD10-J43.9).  8.  Coronary artery disease.  Aortic Atherosclerosis (ICD10-I70.0).    Impression and Plan:  74 year old man with:    1.  Stage IV clear-cell renal cell carcinoma with hepatic and renal bed involvement confirmed in 2020.   He continues to tolerate Pembrolizumab and axitinib without any major complaints.  CT scan obtained on April 12, 2019 was personally reviewed and discussed with the patient in person and with his wife via phone.  Overall he has a positive response to  therapy including reduction in hands hepatic lesions as well as resolution of his psoas muscle lesion.   Risks and benefits of continuing this therapy was discussed today.  He is agreeable to proceed and the plan to repeat imaging studies in 3 months.  2.  Hypertension: Remains a reasonably controlled at this time on the current dose of axitinib.   3.  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  4.  IV access: Port-A-Cath remains in place without any issues.  5.  Prognosis and goals of care: His disease is incurable although aggressive measures are warranted given his excellent performance status.  6.  Immune mediated complications: He is not experiencing any immune issues including pneumonitis, colitis or thyroid disease.  I will continue to monitor this and educate him about potential complications.  7. Followup: He will return for the next cycle of therapy in 3 weeks.  30 minutes was dedicated to  this visit.  Time was spent on reviewing imaging studies, discussing treatment complications and future plan of care.  Zola Button, MD 2/3/202111:59 AM

## 2019-04-27 ENCOUNTER — Telehealth: Payer: Self-pay

## 2019-04-27 NOTE — Telephone Encounter (Signed)
Called patient back and made him aware of the recommendation to try Benadryl at night prior to bedtime. Explained that depending on how long it takes to be excreted from his body it can perhaps help decrease the episodes or intensity of the episodes the following day. Also explained that Dr. Alen Blew recommended that he can try OTC hydrocortisone cream. Patient verbalized understanding and had no other questions or concerns.

## 2019-04-27 NOTE — Telephone Encounter (Signed)
-----   Message from [name], MD sent at 04/27/2019  1:31 PM EST ----- Regarding: RE: patient concern I recommend taking Benadryl at nighttime in case it makes him sleepy.  He can also use hydrocortisone cream he can get over-the-counter.  Thanks ----- Message ----- From: Scot Dock, RN Sent: 04/27/2019   1:19 PM EST To: [name], MD Subject: patient concern                                X 1 week he has had several episodes a day of intense whole body itching with a red rash. The episodes subside quickly but he is very uncomfortable when they occur. He has not tried Benadryl and prefers not to take it because it makes him sleep. Please advise.

## 2019-05-02 ENCOUNTER — Other Ambulatory Visit: Payer: Self-pay | Admitting: Oncology

## 2019-05-02 DIAGNOSIS — C649 Malignant neoplasm of unspecified kidney, except renal pelvis: Secondary | ICD-10-CM

## 2019-05-11 ENCOUNTER — Other Ambulatory Visit: Payer: Self-pay

## 2019-05-11 ENCOUNTER — Telehealth: Payer: Self-pay | Admitting: Oncology

## 2019-05-11 ENCOUNTER — Inpatient Hospital Stay: Payer: Medicare Other

## 2019-05-11 ENCOUNTER — Inpatient Hospital Stay (HOSPITAL_BASED_OUTPATIENT_CLINIC_OR_DEPARTMENT_OTHER): Payer: Medicare Other | Admitting: Oncology

## 2019-05-11 VITALS — BP 147/97 | HR 86 | Temp 97.8°F | Resp 18 | Wt 183.7 lb

## 2019-05-11 DIAGNOSIS — I10 Essential (primary) hypertension: Secondary | ICD-10-CM | POA: Diagnosis not present

## 2019-05-11 DIAGNOSIS — C649 Malignant neoplasm of unspecified kidney, except renal pelvis: Secondary | ICD-10-CM

## 2019-05-11 DIAGNOSIS — C7982 Secondary malignant neoplasm of genital organs: Secondary | ICD-10-CM | POA: Diagnosis not present

## 2019-05-11 DIAGNOSIS — C787 Secondary malignant neoplasm of liver and intrahepatic bile duct: Secondary | ICD-10-CM | POA: Diagnosis not present

## 2019-05-11 DIAGNOSIS — Z95828 Presence of other vascular implants and grafts: Secondary | ICD-10-CM

## 2019-05-11 DIAGNOSIS — Z5112 Encounter for antineoplastic immunotherapy: Secondary | ICD-10-CM | POA: Diagnosis not present

## 2019-05-11 DIAGNOSIS — D751 Secondary polycythemia: Secondary | ICD-10-CM | POA: Diagnosis not present

## 2019-05-11 LAB — CBC WITH DIFFERENTIAL (CANCER CENTER ONLY)
Abs Immature Granulocytes: 0.06 10*3/uL (ref 0.00–0.07)
Basophils Absolute: 0.1 10*3/uL (ref 0.0–0.1)
Basophils Relative: 1 %
Eosinophils Absolute: 0.2 10*3/uL (ref 0.0–0.5)
Eosinophils Relative: 4 %
HCT: 52.3 % — ABNORMAL HIGH (ref 39.0–52.0)
Hemoglobin: 17.2 g/dL — ABNORMAL HIGH (ref 13.0–17.0)
Immature Granulocytes: 1 %
Lymphocytes Relative: 26 %
Lymphs Abs: 1.6 10*3/uL (ref 0.7–4.0)
MCH: 25.8 pg — ABNORMAL LOW (ref 26.0–34.0)
MCHC: 32.9 g/dL (ref 30.0–36.0)
MCV: 78.5 fL — ABNORMAL LOW (ref 80.0–100.0)
Monocytes Absolute: 0.6 10*3/uL (ref 0.1–1.0)
Monocytes Relative: 11 %
Neutro Abs: 3.6 10*3/uL (ref 1.7–7.7)
Neutrophils Relative %: 57 %
Platelet Count: 228 10*3/uL (ref 150–400)
RBC: 6.66 MIL/uL — ABNORMAL HIGH (ref 4.22–5.81)
RDW: 17.3 % — ABNORMAL HIGH (ref 11.5–15.5)
WBC Count: 6.1 10*3/uL (ref 4.0–10.5)
nRBC: 0 % (ref 0.0–0.2)

## 2019-05-11 LAB — CMP (CANCER CENTER ONLY)
ALT: 14 U/L (ref 0–44)
AST: 16 U/L (ref 15–41)
Albumin: 4.2 g/dL (ref 3.5–5.0)
Alkaline Phosphatase: 59 U/L (ref 38–126)
Anion gap: 8 (ref 5–15)
BUN: 27 mg/dL — ABNORMAL HIGH (ref 8–23)
CO2: 26 mmol/L (ref 22–32)
Calcium: 9.2 mg/dL (ref 8.9–10.3)
Chloride: 105 mmol/L (ref 98–111)
Creatinine: 1.45 mg/dL — ABNORMAL HIGH (ref 0.61–1.24)
GFR, Est AFR Am: 55 mL/min — ABNORMAL LOW (ref >60)
GFR, Estimated: 48 mL/min — ABNORMAL LOW (ref >60)
Glucose, Bld: 102 mg/dL — ABNORMAL HIGH (ref 70–99)
Potassium: 4.4 mmol/L (ref 3.5–5.1)
Sodium: 139 mmol/L (ref 135–145)
Total Bilirubin: 1 mg/dL (ref 0.3–1.2)
Total Protein: 7.7 g/dL (ref 6.5–8.1)

## 2019-05-11 MED ORDER — HEPARIN SOD (PORK) LOCK FLUSH 100 UNIT/ML IV SOLN
500.0000 [IU] | Freq: Once | INTRAVENOUS | Status: AC | PRN
  Administered 2019-05-11: 500 [IU]
  Filled 2019-05-11: qty 5

## 2019-05-11 MED ORDER — SODIUM CHLORIDE 0.9% FLUSH
10.0000 mL | INTRAVENOUS | Status: DC | PRN
  Administered 2019-05-11: 10 mL
  Filled 2019-05-11: qty 10

## 2019-05-11 MED ORDER — SODIUM CHLORIDE 0.9% FLUSH
10.0000 mL | INTRAVENOUS | Status: DC | PRN
  Administered 2019-05-11: 10 mL via INTRAVENOUS
  Filled 2019-05-11: qty 10

## 2019-05-11 MED ORDER — SODIUM CHLORIDE 0.9 % IV SOLN
Freq: Once | INTRAVENOUS | Status: AC
  Filled 2019-05-11: qty 250

## 2019-05-11 MED ORDER — SODIUM CHLORIDE 0.9 % IV SOLN
200.0000 mg | Freq: Once | INTRAVENOUS | Status: AC
  Administered 2019-05-11: 200 mg via INTRAVENOUS
  Filled 2019-05-11: qty 8

## 2019-05-11 MED ORDER — HYDROXYZINE HCL 10 MG PO TABS: 10.0000 mg | ORAL_TABLET | Freq: Three times a day (TID) | ORAL | 0 refills | Status: DC | PRN

## 2019-05-11 NOTE — Progress Notes (Addendum)
Hematology and Oncology Follow Up Visit  [name] [id number] [birth year] 74 y.o. 05/11/2019 12:14 PM Reyes Russell, DO   Principle Diagnosis: 74 year old man with:  1.  Kidney cancer diagnosed in April 2020.  He was found to have stage IV clear-cell histology with hepatic involvement.   2. 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   Prior Therapy:   Therapeutic phlebotomy as needed.  He status post radical nephrectomy April 2020.  The final pathology showed clear cell histology invading into the perineural fat indicating stage T3a.  Tumor margins are negative.  He status post orchiectomy completed on 10/29/2018.  He final pathology showed metastatic carcinoma consistent with clear cell histology.  Current therapy:   Phlebotomy every 6 months to keep his hematocrit less than 45.  Pembrolizumab 200 mg every 3 weeks started on 01/26/2019.  Axitinib 5 mg daily since December 2020 due to side effects.  Interim History:  [name] is here for a follow-up visit.  Since the last visit, he reports more pruritus associated with the last cycle Pembrolizumab.  He reports is a diffuse sensation from head to toe exacerbated by being in the sun.  He denies any nausea, vomiting or shortness of breath.  He denies any weight loss or appetite changes.  But he is dealing with a lot of anxiety associated with these symptoms and overall cancer diagnosis.    .               Medications: Without any changes on review. Current Outpatient Medications  Medication Sig Dispense Refill  . busPIRone (BUSPAR) 5 MG tablet Take 5 mg by mouth 2 (two) times daily as needed (for anxiety).    . fluticasone (FLONASE) 50 MCG/ACT nasal spray Place 1 spray into both nostrils daily as needed for allergies.     . INLYTA 5 MG tablet TAKE 1 TABLET BY MOUTH TWICE DAILY. TAKE 12 HOURS APART WITH WATER, WITH OR WITHOUT FOOD 60 tablet 0  .  lidocaine-prilocaine (EMLA) cream Apply 1 application topically as needed. 30 g 0  . LORazepam (ATIVAN) 1 MG tablet Take 1 tablet (1 mg total) by mouth every 8 (eight) hours. 30 tablet 0  . losartan-hydrochlorothiazide (HYZAAR) 100-25 MG tablet Take 1 tablet by mouth daily.    . meloxicam (MOBIC) 15 MG tablet Take 15 mg by mouth as needed for pain.    Marland Kitchen oxyCODONE-acetaminophen (PERCOCET) 5-325 MG tablet Take 1-2 tablets by mouth every 8 (eight) hours as needed for moderate pain or severe pain. Post-operatively 20 tablet 0  . pravastatin (PRAVACHOL) 40 MG tablet Take 40 mg by mouth daily.    . prochlorperazine (COMPAZINE) 10 MG tablet Take 1 tablet (10 mg total) by mouth every 6 (six) hours as needed for nausea or vomiting. 30 tablet 0   No current facility-administered medications for this visit.   Facility-Administered Medications Ordered in Other Visits  Medication Dose Route Frequency Provider Last Rate Last Admin  . sodium chloride flush (NS) 0.9 % injection 10 mL  10 mL Intravenous PRN [name], MD   10 mL at 05/11/19 1208     Allergies:  Allergies  Allergen Reactions  . Oxycodone Other (See Comments)    Patient stated,"it keeps me wide awake and I can't sleep."  . Septra [Sulfamethoxazole-Trimethoprim] Hives and Itching      Physical exam:     Blood pressure (!) 147/97, pulse 86, temperature 97.8 F (36.6 C), temperature source  Temporal, resp. rate 18, weight 183 lb 11.2 oz (83.3 kg), SpO2 98 %.      ECOG: 0    General appearance: Comfortable appearing without any discomfort Head: Normocephalic without any trauma Oropharynx: Mucous membranes are moist and pink without any thrush or ulcers. Eyes: Pupils are equal and round reactive to light. Lymph nodes: No cervical, supraclavicular, inguinal or axillary lymphadenopathy.   Heart:regular rate and rhythm.  S1 and S2 without leg edema. Lung: Clear without any rhonchi or wheezes.  No dullness to  percussion. Abdomin: Soft, nontender, nondistended with good bowel sounds.  No hepatosplenomegaly. Musculoskeletal: No joint deformity or effusion.  Full range of motion noted. Neurological: No deficits noted on motor, sensory and deep tendon reflex exam. Skin: No rash or petechiae noted.  Overall red discoloration noted.           Lab Results: Lab Results  Component Value Date   WBC 6.0 04/19/2019   HGB 17.8 (H) 04/19/2019   HCT 53.4 (H) 04/19/2019   MCV 79.1 (L) 04/19/2019   PLT 224 04/19/2019     Chemistry      Component Value Date/Time   NA 139 04/19/2019 1157   NA 141 01/18/2015 0835   K 4.0 04/19/2019 1157   K 4.1 01/18/2015 0835   CL 106 04/19/2019 1157   CO2 27 04/19/2019 1157   CO2 25 01/18/2015 0835   BUN 21 04/19/2019 1157   BUN 21.5 01/18/2015 0835   CREATININE 1.44 (H) 04/19/2019 1157   CREATININE 1.1 01/18/2015 0835      Component Value Date/Time   CALCIUM 9.3 04/19/2019 1157   CALCIUM 9.8 01/18/2015 0835   ALKPHOS 62 04/19/2019 1157   ALKPHOS 66 01/18/2015 0835   AST 18 04/19/2019 1157   AST 15 01/18/2015 0835   ALT 14 04/19/2019 1157   ALT 16 01/18/2015 0835   BILITOT 0.6 04/19/2019 1157   BILITOT 0.91 01/18/2015 0835      I  Impression and Plan:  74 year old man with:    1.  Kidney cancer diagnosed in 2020.  He was found to have stage IV clear-cell histology with hepatic metastasis.   He continues to tolerate Pembrolizumab and axitinib without any major complications.  Imaging studies in January 2021 showed reasonable radiographic response to therapy.  Risks and benefits of continuing this approach was reviewed.  Potential switching to single agent oral targeted therapy with axitinib.  Given his increased pruritus consideration for discontinuation of Pembrolizumab were also discussed.  For the time being he is able to deal with it and willing to continue.  2.  Hypertension: Mildly elevated but overall under control.   3.  Secondary  polycythemia: I recommended continuing phlebotomy to keep his hematocrit below 45  4.  IV access: Port-A-Cath currently in use without any issues.  5.  Prognosis and goals of care: Therapy remains palliative although aggressive measures are warranted given his excellent performance status.  6.  Immune mediated complications: I continue to educate him about potential complications related to immunotherapy.  Thyroid disease, pneumonitis, colitis, arthritis were all discussed.  His pruritus at this time will be managed with antihistamines and hydroxyzine.  7. Followup: In 3 weeks for repeat evaluation.   30 minutes were spent on this encounter.  The time was dedicated to reviewing his disease status, treatment options and addressing complications related to therapy.  Zola Button, MD 2/25/202112:14 PM

## 2019-05-11 NOTE — Progress Notes (Signed)
[name] presents today for phlebotomy per MD orders. Phlebotomy procedure started at 1305 and ended at 1353. 505 grams removed. Performed by Marcene Brawn RN.  Blood obtained from pt's portacath.   Patient observed for 30 minutes after procedure without any incident. Patient tolerated procedure well.  Pt had keytruda infusion after phlebotomy procedure without incident.

## 2019-05-11 NOTE — Addendum Note (Signed)
Addended by: [name] on: 05/11/2019 12:39 PM   Modules accepted: Orders

## 2019-05-11 NOTE — Patient Instructions (Signed)
[facility] Discharge Instructions for Patients Receiving Chemotherapy  Today you received the following chemotherapy agents: Keytruda.  To help prevent nausea and vomiting after your treatment, we encourage you to take your nausea medication as directed.   If you develop nausea and vomiting that is not controlled by your nausea medication, call the clinic.   BELOW ARE SYMPTOMS THAT SHOULD BE REPORTED IMMEDIATELY:  *FEVER GREATER THAN 100.5 F  *CHILLS WITH OR WITHOUT FEVER  NAUSEA AND VOMITING THAT IS NOT CONTROLLED WITH YOUR NAUSEA MEDICATION  *UNUSUAL SHORTNESS OF BREATH  *UNUSUAL BRUISING OR BLEEDING  TENDERNESS IN MOUTH AND THROAT WITH OR WITHOUT PRESENCE OF ULCERS  *URINARY PROBLEMS  *BOWEL PROBLEMS  UNUSUAL RASH Items with * indicate a potential emergency and should be followed up as soon as possible.  Feel free to call the clinic should you have any questions or concerns. The clinic phone number is [phone].  Please show the [facility] at check-in to the Emergency Department and triage nurse.   Therapeutic Phlebotomy, Care After This sheet gives you information about how to care for yourself after your procedure. Your health care provider may also give you more specific instructions. If you have problems or questions, contact your health care provider. What can I expect after the procedure? After the procedure, it is common to have:  Light-headedness or dizziness. You may feel faint.  Nausea.  Tiredness (fatigue). Follow these instructions at home: Eating and drinking  Be sure to eat well-balanced meals for the next 24 hours.  Drink enough fluid to keep your urine pale yellow.  Avoid drinking alcohol on the day that you had the procedure. Activity   Return to your normal activities as told by your health care provider. Most people can go back to their normal activities right away.  Avoid activities that take a lot of effort  for about 5 hours after the procedure. Athletes should avoid strenuous exercise for at least 12 hours.  Avoid heavy lifting or pulling for about 5 hours after the procedure. Do not lift anything that is heavier than 10 lb (4.5 kg).  Change positions slowly for the remainder of the day. This will help to prevent light-headedness or fainting.  If you feel light-headed, lie down until the feeling goes away. Needle insertion site care   Keep your bandage (dressing) dry. You can remove the bandage after about 5 hours or as told by your health care provider.  If you have bleeding from the needle insertion site, raise (elevate) your arm and press firmly on the site until the bleeding stops.  If you have bruising at the site, apply ice to the area: ? Remove the dressing. ? Put ice in a plastic bag. ? Place a towel between your skin and the bag. ? Leave the ice on for 20 minutes, 2-3 times a day for the first 24 hours.  If the swelling does not go away after 24 hours, apply a warm, moist cloth (warm compress) to the area for 20 minutes, 2-3 times a day. General instructions  Do not use any products that contain nicotine or tobacco, such as cigarettes and e-cigarettes, for at least 30 minutes after the procedure.  Keep all follow-up visits as told by your health care provider. This is important. You may need to continue having regular therapeutic phlebotomy treatments as directed. Contact a health care provider if you:  Have redness, swelling, or pain at the needle insertion site.  Have  fluid or blood coming from the needle insertion site.  Have pus or a bad smell coming from the needle insertion site.  Notice that the needle insertion site feels warm to the touch.  Feel light-headed, dizzy, or nauseous, and the feeling does not go away.  Have new bruising at the needle insertion site.  Feel weaker than normal.  Have a fever or chills. Get help right away if:  You faint.  You  have chest pain.  You have trouble breathing.  You have severe nausea or vomiting. Summary  After the procedure, it is common to have some light-headedness, dizziness, nausea, or tiredness (fatigue).  Be sure to eat well-balanced meals for the next 24 hours. Drink enough fluid to keep your urine pale yellow.  Return to your normal activities as told by your health care provider.  Keep all follow-up visits as told by your health care provider. You may need to continue having regular therapeutic phlebotomy treatments as directed. This information is not intended to replace advice given to you by your health care provider. Make sure you discuss any questions you have with your health care provider. Document Revised: 03/19/2017 Document Reviewed: 03/18/2017 Elsevier Patient Education  [place].

## 2019-05-11 NOTE — Telephone Encounter (Signed)
Scheduled appt per 2/25 los.

## 2019-05-18 ENCOUNTER — Telehealth: Payer: Self-pay

## 2019-05-18 ENCOUNTER — Other Ambulatory Visit: Payer: Self-pay

## 2019-05-18 MED ORDER — HYDROXYZINE HCL 10 MG PO TABS: 10.0000 mg | ORAL_TABLET | Freq: Three times a day (TID) | ORAL | 3 refills | Status: DC | PRN

## 2019-05-18 NOTE — Telephone Encounter (Signed)
Called and informed patient a refill has been sent in to his pharmacy. Advised patient to call office if itching/rash becomes worse. Patient verbalized understanding.

## 2019-05-18 NOTE — Telephone Encounter (Signed)
-----   Message from [name], MD sent at 05/18/2019 10:03 AM EST ----- Regarding: RE: Refill request Please refill with same quantity and 3 refills.  Thanks ----- Message ----- From: Teodoro Spray, RN Sent: 05/18/2019   9:55 AM EST To: [name], MD Subject: Refill request                                 Patient called stating his rash/itchiness has improved since the started taking Hydroxyzine. Patient states he is running low on his prescription and is requesting a refill be sent in.  Would you like to refill his prescription?  Please advise.  Thank you.

## 2019-05-31 ENCOUNTER — Inpatient Hospital Stay (HOSPITAL_BASED_OUTPATIENT_CLINIC_OR_DEPARTMENT_OTHER): Payer: Medicare Other | Admitting: Oncology

## 2019-05-31 ENCOUNTER — Inpatient Hospital Stay: Payer: Medicare Other

## 2019-05-31 ENCOUNTER — Inpatient Hospital Stay: Payer: Medicare Other | Attending: Oncology

## 2019-05-31 ENCOUNTER — Other Ambulatory Visit: Payer: Self-pay

## 2019-05-31 VITALS — BP 138/92 | HR 67 | Temp 98.3°F | Resp 18 | Ht 69.0 in | Wt 186.9 lb

## 2019-05-31 DIAGNOSIS — C7982 Secondary malignant neoplasm of genital organs: Secondary | ICD-10-CM | POA: Diagnosis not present

## 2019-05-31 DIAGNOSIS — Z9079 Acquired absence of other genital organ(s): Secondary | ICD-10-CM | POA: Insufficient documentation

## 2019-05-31 DIAGNOSIS — Z791 Long term (current) use of non-steroidal anti-inflammatories (NSAID): Secondary | ICD-10-CM | POA: Insufficient documentation

## 2019-05-31 DIAGNOSIS — Z5112 Encounter for antineoplastic immunotherapy: Secondary | ICD-10-CM | POA: Diagnosis not present

## 2019-05-31 DIAGNOSIS — D751 Secondary polycythemia: Secondary | ICD-10-CM | POA: Diagnosis not present

## 2019-05-31 DIAGNOSIS — Z79899 Other long term (current) drug therapy: Secondary | ICD-10-CM | POA: Diagnosis not present

## 2019-05-31 DIAGNOSIS — E079 Disorder of thyroid, unspecified: Secondary | ICD-10-CM | POA: Insufficient documentation

## 2019-05-31 DIAGNOSIS — C649 Malignant neoplasm of unspecified kidney, except renal pelvis: Secondary | ICD-10-CM

## 2019-05-31 DIAGNOSIS — Z95828 Presence of other vascular implants and grafts: Secondary | ICD-10-CM

## 2019-05-31 DIAGNOSIS — I10 Essential (primary) hypertension: Secondary | ICD-10-CM | POA: Insufficient documentation

## 2019-05-31 DIAGNOSIS — C787 Secondary malignant neoplasm of liver and intrahepatic bile duct: Secondary | ICD-10-CM | POA: Insufficient documentation

## 2019-05-31 LAB — CMP (CANCER CENTER ONLY)
ALT: 11 U/L (ref 0–44)
AST: 18 U/L (ref 15–41)
Albumin: 4 g/dL (ref 3.5–5.0)
Alkaline Phosphatase: 62 U/L (ref 38–126)
Anion gap: 7 (ref 5–15)
BUN: 18 mg/dL (ref 8–23)
CO2: 26 mmol/L (ref 22–32)
Calcium: 9.2 mg/dL (ref 8.9–10.3)
Chloride: 105 mmol/L (ref 98–111)
Creatinine: 1.6 mg/dL — ABNORMAL HIGH (ref 0.61–1.24)
GFR, Est AFR Am: 49 mL/min — ABNORMAL LOW (ref >60)
GFR, Estimated: 42 mL/min — ABNORMAL LOW (ref >60)
Glucose, Bld: 100 mg/dL — ABNORMAL HIGH (ref 70–99)
Potassium: 4.1 mmol/L (ref 3.5–5.1)
Sodium: 138 mmol/L (ref 135–145)
Total Bilirubin: 0.7 mg/dL (ref 0.3–1.2)
Total Protein: 7.5 g/dL (ref 6.5–8.1)

## 2019-05-31 LAB — CBC WITH DIFFERENTIAL (CANCER CENTER ONLY)
Abs Immature Granulocytes: 0.02 10*3/uL (ref 0.00–0.07)
Basophils Absolute: 0.1 10*3/uL (ref 0.0–0.1)
Basophils Relative: 1 %
Eosinophils Absolute: 0.5 10*3/uL (ref 0.0–0.5)
Eosinophils Relative: 8 %
HCT: 51.1 % (ref 39.0–52.0)
Hemoglobin: 17 g/dL (ref 13.0–17.0)
Immature Granulocytes: 0 %
Lymphocytes Relative: 27 %
Lymphs Abs: 1.6 10*3/uL (ref 0.7–4.0)
MCH: 26.7 pg (ref 26.0–34.0)
MCHC: 33.3 g/dL (ref 30.0–36.0)
MCV: 80.2 fL (ref 80.0–100.0)
Monocytes Absolute: 0.6 10*3/uL (ref 0.1–1.0)
Monocytes Relative: 9 %
Neutro Abs: 3.3 10*3/uL (ref 1.7–7.7)
Neutrophils Relative %: 55 %
Platelet Count: 242 10*3/uL (ref 150–400)
RBC: 6.37 MIL/uL — ABNORMAL HIGH (ref 4.22–5.81)
RDW: 15.2 % (ref 11.5–15.5)
WBC Count: 6 10*3/uL (ref 4.0–10.5)
nRBC: 0 % (ref 0.0–0.2)

## 2019-05-31 MED ORDER — SODIUM CHLORIDE 0.9 % IV SOLN
Freq: Once | INTRAVENOUS | Status: AC
  Filled 2019-05-31: qty 250

## 2019-05-31 MED ORDER — HEPARIN SOD (PORK) LOCK FLUSH 100 UNIT/ML IV SOLN
500.0000 [IU] | Freq: Once | INTRAVENOUS | Status: AC | PRN
  Administered 2019-05-31: 500 [IU]
  Filled 2019-05-31: qty 5

## 2019-05-31 MED ORDER — SODIUM CHLORIDE 0.9% FLUSH
10.0000 mL | INTRAVENOUS | Status: DC | PRN
  Administered 2019-05-31: 10 mL via INTRAVENOUS
  Filled 2019-05-31: qty 10

## 2019-05-31 MED ORDER — SODIUM CHLORIDE 0.9% FLUSH
10.0000 mL | INTRAVENOUS | Status: DC | PRN
  Administered 2019-05-31: 14:00:00 10 mL
  Filled 2019-05-31: qty 10

## 2019-05-31 MED ORDER — SODIUM CHLORIDE 0.9 % IV SOLN
200.0000 mg | Freq: Once | INTRAVENOUS | Status: AC
  Administered 2019-05-31: 200 mg via INTRAVENOUS
  Filled 2019-05-31: qty 8

## 2019-05-31 NOTE — Patient Instructions (Signed)
[facility] Discharge Instructions for Patients Receiving Chemotherapy  Today you received the following chemotherapy agents: Pembrolizumab   To help prevent nausea and vomiting after your treatment, we encourage you to take your nausea medication as directed.    If you develop nausea and vomiting that is not controlled by your nausea medication, call the clinic.   BELOW ARE SYMPTOMS THAT SHOULD BE REPORTED IMMEDIATELY:  *FEVER GREATER THAN 100.5 F  *CHILLS WITH OR WITHOUT FEVER  NAUSEA AND VOMITING THAT IS NOT CONTROLLED WITH YOUR NAUSEA MEDICATION  *UNUSUAL SHORTNESS OF BREATH  *UNUSUAL BRUISING OR BLEEDING  TENDERNESS IN MOUTH AND THROAT WITH OR WITHOUT PRESENCE OF ULCERS  *URINARY PROBLEMS  *BOWEL PROBLEMS  UNUSUAL RASH Items with * indicate a potential emergency and should be followed up as soon as possible.  Feel free to call the clinic should you have any questions or concerns. The clinic phone number is [phone].  Please show the [facility] at check-in to the Emergency Department and triage nurse.   Therapeutic Phlebotomy, Care After This sheet gives you information about how to care for yourself after your procedure. Your health care provider may also give you more specific instructions. If you have problems or questions, contact your health care provider. What can I expect after the procedure? After the procedure, it is common to have:  Light-headedness or dizziness. You may feel faint.  Nausea.  Tiredness (fatigue). Follow these instructions at home: Eating and drinking  Be sure to eat well-balanced meals for the next 24 hours.  Drink enough fluid to keep your urine pale yellow.  Avoid drinking alcohol on the day that you had the procedure. Activity   Return to your normal activities as told by your health care provider. Most people can go back to their normal activities right away.  Avoid activities that take a lot of  effort for about 5 hours after the procedure. Athletes should avoid strenuous exercise for at least 12 hours.  Avoid heavy lifting or pulling for about 5 hours after the procedure. Do not lift anything that is heavier than 10 lb (4.5 kg).  Change positions slowly for the remainder of the day. This will help to prevent light-headedness or fainting.  If you feel light-headed, lie down until the feeling goes away. Needle insertion site care   Keep your bandage (dressing) dry. You can remove the bandage after about 5 hours or as told by your health care provider.  If you have bleeding from the needle insertion site, raise (elevate) your arm and press firmly on the site until the bleeding stops.  If you have bruising at the site, apply ice to the area: ? Remove the dressing. ? Put ice in a plastic bag. ? Place a towel between your skin and the bag. ? Leave the ice on for 20 minutes, 2-3 times a day for the first 24 hours.  If the swelling does not go away after 24 hours, apply a warm, moist cloth (warm compress) to the area for 20 minutes, 2-3 times a day. General instructions  Do not use any products that contain nicotine or tobacco, such as cigarettes and e-cigarettes, for at least 30 minutes after the procedure.  Keep all follow-up visits as told by your health care provider. This is important. You may need to continue having regular therapeutic phlebotomy treatments as directed. Contact a health care provider if you:  Have redness, swelling, or pain at the needle insertion site.  Have fluid or blood coming from the needle insertion site.  Have pus or a bad smell coming from the needle insertion site.  Notice that the needle insertion site feels warm to the touch.  Feel light-headed, dizzy, or nauseous, and the feeling does not go away.  Have new bruising at the needle insertion site.  Feel weaker than normal.  Have a fever or chills. Get help right away if:  You  faint.  You have chest pain.  You have trouble breathing.  You have severe nausea or vomiting. Summary  After the procedure, it is common to have some light-headedness, dizziness, nausea, or tiredness (fatigue).  Be sure to eat well-balanced meals for the next 24 hours. Drink enough fluid to keep your urine pale yellow.  Return to your normal activities as told by your health care provider.  Keep all follow-up visits as told by your health care provider. You may need to continue having regular therapeutic phlebotomy treatments as directed. This information is not intended to replace advice given to you by your health care provider. Make sure you discuss any questions you have with your health care provider. Document Revised: 03/19/2017 Document Reviewed: 03/18/2017 Elsevier Patient Education  [place].

## 2019-05-31 NOTE — Progress Notes (Signed)
Per Dr. Alen Blew, [place] to treat with today's creatinine 1.6. Therapeutic phlebotomy performed per MD order. 510g removed via left AC with 16g angiocath. Procedure started at 1223, ended at 1228. Pt tolerated well. Fluids encouraged.

## 2019-05-31 NOTE — Progress Notes (Signed)
Hematology and Oncology Follow Up Visit  [name] [id number] 1945-06-01 74 y.o. 05/31/2019 11:39 AM [name] Russell, DO   Principle Diagnosis: 74 year old man with:  1.  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  He has documented hepatic metastasis.   2.  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   Prior Therapy:   Therapeutic phlebotomy as needed.  He status post radical nephrectomy April 2020.  The final pathology showed clear cell histology invading into the perineural fat indicating stage T3a.  Tumor margins are negative.  He status post orchiectomy completed on 10/29/2018.  He final pathology showed metastatic carcinoma consistent with clear cell histology.  Current therapy:   Phlebotomy every 6 months to keep his hematocrit less than 45.  Pembrolizumab 200 mg every 3 weeks started on 01/26/2019.  Axitinib 5 mg daily since December 2020 due to side effects.  Interim History:  [name] is here for a return evaluation.  Since the last visit, he reports no major changes in his health.  He tolerated the last cycle of therapy without any major complaints.  His pruritus has improved and currently manageable with Atarax.  He denies any recent chest pain, difficulty breathing or recent hospitalizations.  He denies any decline in energy or constitutional symptoms.  Continues to ambulate and attends activities of daily living.  Appetite remains reasonable with weight is stable.    .               Medications: Reviewed and updated. Current Outpatient Medications  Medication Sig Dispense Refill  . busPIRone (BUSPAR) 5 MG tablet Take 5 mg by mouth 2 (two) times daily as needed (for anxiety).    . fluticasone (FLONASE) 50 MCG/ACT nasal spray Place 1 spray into both nostrils daily as needed for allergies.     . hydrOXYzine (ATARAX/VISTARIL) 10 MG tablet Take 1 tablet (10 mg total) by mouth 3 (three) times daily as needed.  30 tablet 3  . INLYTA 5 MG tablet TAKE 1 TABLET BY MOUTH TWICE DAILY. TAKE 12 HOURS APART WITH WATER, WITH OR WITHOUT FOOD 60 tablet 0  . lidocaine-prilocaine (EMLA) cream Apply 1 application topically as needed. 30 g 0  . LORazepam (ATIVAN) 1 MG tablet Take 1 tablet (1 mg total) by mouth every 8 (eight) hours. 30 tablet 0  . losartan-hydrochlorothiazide (HYZAAR) 100-25 MG tablet Take 1 tablet by mouth daily.    . meloxicam (MOBIC) 15 MG tablet Take 15 mg by mouth as needed for pain.    Marland Kitchen oxyCODONE-acetaminophen (PERCOCET) 5-325 MG tablet Take 1-2 tablets by mouth every 8 (eight) hours as needed for moderate pain or severe pain. Post-operatively 20 tablet 0  . pravastatin (PRAVACHOL) 40 MG tablet Take 40 mg by mouth daily.    . prochlorperazine (COMPAZINE) 10 MG tablet Take 1 tablet (10 mg total) by mouth every 6 (six) hours as needed for nausea or vomiting. 30 tablet 0   No current facility-administered medications for this visit.   Facility-Administered Medications Ordered in Other Visits  Medication Dose Route Frequency Provider Last Rate Last Admin  . sodium chloride flush (NS) 0.9 % injection 10 mL  10 mL Intravenous PRN [name] Portela, MD   10 mL at 05/31/19 1133     Allergies:  Allergies  Allergen Reactions  . Oxycodone Other (See Comments)    Patient stated,"it keeps me wide awake and I can't sleep."  . Septra [Sulfamethoxazole-Trimethoprim] Hives and Itching  Physical exam:     Blood pressure (!) 138/92, pulse 67, temperature 98.3 F (36.8 C), temperature source Temporal, resp. rate 18, height 5\' 9"  (1.753 m), weight 186 lb 14.4 oz (84.8 kg), SpO2 99 %.      ECOG: 0     General appearance: Alert, awake without any distress. Head: Atraumatic without abnormalities Oropharynx: Without any thrush or ulcers. Eyes: No scleral icterus. Lymph nodes: No lymphadenopathy noted in the cervical, supraclavicular, or axillary nodes Heart:regular rate and rhythm,  without any murmurs or gallops.   Lung: Clear to auscultation without any rhonchi, wheezes or dullness to percussion. Abdomin: Soft, nontender without any shifting dullness or ascites. Musculoskeletal: No clubbing or cyanosis. Neurological: No motor or sensory deficits. Skin: No rashes or lesions.             Lab Results: Lab Results  Component Value Date   WBC 6.1 05/11/2019   HGB 17.2 (H) 05/11/2019   HCT 52.3 (H) 05/11/2019   MCV 78.5 (L) 05/11/2019   PLT 228 05/11/2019     Chemistry      Component Value Date/Time   NA 139 05/11/2019 1208   NA 141 01/18/2015 0835   K 4.4 05/11/2019 1208   K 4.1 01/18/2015 0835   CL 105 05/11/2019 1208   CO2 26 05/11/2019 1208   CO2 25 01/18/2015 0835   BUN 27 (H) 05/11/2019 1208   BUN 21.5 01/18/2015 0835   CREATININE 1.45 (H) 05/11/2019 1208   CREATININE 1.1 01/18/2015 0835      Component Value Date/Time   CALCIUM 9.2 05/11/2019 1208   CALCIUM 9.8 01/18/2015 0835   ALKPHOS 59 05/11/2019 1208   ALKPHOS 66 01/18/2015 0835   AST 16 05/11/2019 1208   AST 15 01/18/2015 0835   ALT 14 05/11/2019 1208   ALT 16 01/18/2015 0835   BILITOT 1.0 05/11/2019 1208   BILITOT 0.91 01/18/2015 0835      I  Impression and Plan:  74 year old man with:    1.  Stage IV clear-cell renal cell carcinoma with hepatic involvement diagnosed in 2020.    He has tolerated Pembrolizumab and axitinib without any major complications at the current dose.  Risks and benefits of continuing this therapy was discussed.  Imaging studies obtained on January 27 showed treatment response and overall clinical benefit.  Risks and benefits of continuing this therapy and potential complications were reiterated.  These include GI complications, hypertension as well as immune mediated issues.  Alternative options would be to treat with single agent oral targeted therapy with axitinib or cabozantinib.  For the time being he is willing to continue plan to repeat  imaging studies in 3 months.  2.  Hypertension: Blood pressure mildly elevated we will continue to monitor on axitinib.   3.  Secondary polycythemia: Related to malignancy and his kidney tumor.  I recommended continuing phlebotomy to keep his hematocrit less than 45.  We will continue to proceed with this every 3 weeks.  He noticed improvement in his pruritus after phlebotomy which could be contributing at this time.  4.  IV access: Port-A-Cath remains in use without any complications.  5.  Prognosis and goals of care: His disease is incurable although aggressive measures are warranted given his excellent performance status.  6.  Immune mediated complications: Issues such as pneumonitis, colitis, thyroid disease as well as dermatitis were reiterated..  7. Followup: He will have a repeat evaluation in 3 weeks.   30 minutes were dedicated to  this visit.  The time was spent on reviewing his disease status, reviewing laboratory data, disease treatment options and future plan of care discussion.  Zola Button, MD 3/17/202111:39 AM

## 2019-06-16 NOTE — Progress Notes (Signed)
Pharmacist Chemotherapy Monitoring - Follow Up Assessment    I verify that I have reviewed each item in the below checklist:  . Regimen for the patient is scheduled for the appropriate day and plan matches scheduled date. Marland Kitchen Appropriate non-routine labs are ordered dependent on drug ordered. . If applicable, additional medications reviewed and ordered per protocol based on lifetime cumulative doses and/or treatment regimen.   Plan for follow-up and/or issues identified: No . I-vent associated with next due treatment: No . MD and/or nursing notified: No  Kimber Esterly K 06/16/2019 8:49 AM

## 2019-06-22 ENCOUNTER — Inpatient Hospital Stay: Payer: Medicare Other | Attending: Oncology

## 2019-06-22 ENCOUNTER — Inpatient Hospital Stay: Payer: Medicare Other

## 2019-06-22 ENCOUNTER — Inpatient Hospital Stay (HOSPITAL_BASED_OUTPATIENT_CLINIC_OR_DEPARTMENT_OTHER): Payer: Medicare Other | Admitting: Oncology

## 2019-06-22 ENCOUNTER — Other Ambulatory Visit: Payer: Self-pay

## 2019-06-22 VITALS — BP 130/83 | HR 63 | Temp 98.7°F | Resp 18 | Ht 69.0 in | Wt 186.4 lb

## 2019-06-22 DIAGNOSIS — C787 Secondary malignant neoplasm of liver and intrahepatic bile duct: Secondary | ICD-10-CM | POA: Insufficient documentation

## 2019-06-22 DIAGNOSIS — Z791 Long term (current) use of non-steroidal anti-inflammatories (NSAID): Secondary | ICD-10-CM | POA: Insufficient documentation

## 2019-06-22 DIAGNOSIS — Z79899 Other long term (current) drug therapy: Secondary | ICD-10-CM | POA: Insufficient documentation

## 2019-06-22 DIAGNOSIS — N2889 Other specified disorders of kidney and ureter: Secondary | ICD-10-CM | POA: Diagnosis not present

## 2019-06-22 DIAGNOSIS — I10 Essential (primary) hypertension: Secondary | ICD-10-CM | POA: Diagnosis not present

## 2019-06-22 DIAGNOSIS — Z5112 Encounter for antineoplastic immunotherapy: Secondary | ICD-10-CM | POA: Diagnosis not present

## 2019-06-22 DIAGNOSIS — C7982 Secondary malignant neoplasm of genital organs: Secondary | ICD-10-CM | POA: Diagnosis present

## 2019-06-22 DIAGNOSIS — E039 Hypothyroidism, unspecified: Secondary | ICD-10-CM

## 2019-06-22 DIAGNOSIS — Z9079 Acquired absence of other genital organ(s): Secondary | ICD-10-CM | POA: Diagnosis not present

## 2019-06-22 DIAGNOSIS — D751 Secondary polycythemia: Secondary | ICD-10-CM

## 2019-06-22 DIAGNOSIS — Z905 Acquired absence of kidney: Secondary | ICD-10-CM | POA: Diagnosis not present

## 2019-06-22 DIAGNOSIS — C649 Malignant neoplasm of unspecified kidney, except renal pelvis: Secondary | ICD-10-CM

## 2019-06-22 DIAGNOSIS — R918 Other nonspecific abnormal finding of lung field: Secondary | ICD-10-CM

## 2019-06-22 DIAGNOSIS — Z95828 Presence of other vascular implants and grafts: Secondary | ICD-10-CM | POA: Diagnosis not present

## 2019-06-22 LAB — CBC WITH DIFFERENTIAL (CANCER CENTER ONLY)
Abs Immature Granulocytes: 0.02 10*3/uL (ref 0.00–0.07)
Basophils Absolute: 0.1 10*3/uL (ref 0.0–0.1)
Basophils Relative: 1 %
Eosinophils Absolute: 0.6 10*3/uL — ABNORMAL HIGH (ref 0.0–0.5)
Eosinophils Relative: 9 %
HCT: 44.8 % (ref 39.0–52.0)
Hemoglobin: 14.9 g/dL (ref 13.0–17.0)
Immature Granulocytes: 0 %
Lymphocytes Relative: 28 %
Lymphs Abs: 1.7 10*3/uL (ref 0.7–4.0)
MCH: 26.7 pg (ref 26.0–34.0)
MCHC: 33.3 g/dL (ref 30.0–36.0)
MCV: 80.3 fL (ref 80.0–100.0)
Monocytes Absolute: 0.6 10*3/uL (ref 0.1–1.0)
Monocytes Relative: 10 %
Neutro Abs: 3.2 10*3/uL (ref 1.7–7.7)
Neutrophils Relative %: 52 %
Platelet Count: 212 10*3/uL (ref 150–400)
RBC: 5.58 MIL/uL (ref 4.22–5.81)
RDW: 13.3 % (ref 11.5–15.5)
WBC Count: 6.2 10*3/uL (ref 4.0–10.5)
nRBC: 0 % (ref 0.0–0.2)

## 2019-06-22 LAB — CMP (CANCER CENTER ONLY)
ALT: 12 U/L (ref 0–44)
AST: 18 U/L (ref 15–41)
Albumin: 4 g/dL (ref 3.5–5.0)
Alkaline Phosphatase: 58 U/L (ref 38–126)
Anion gap: 8 (ref 5–15)
BUN: 20 mg/dL (ref 8–23)
CO2: 25 mmol/L (ref 22–32)
Calcium: 9.1 mg/dL (ref 8.9–10.3)
Chloride: 105 mmol/L (ref 98–111)
Creatinine: 1.4 mg/dL — ABNORMAL HIGH (ref 0.61–1.24)
GFR, Est AFR Am: 58 mL/min — ABNORMAL LOW (ref >60)
GFR, Estimated: 50 mL/min — ABNORMAL LOW (ref >60)
Glucose, Bld: 93 mg/dL (ref 70–99)
Potassium: 4.1 mmol/L (ref 3.5–5.1)
Sodium: 138 mmol/L (ref 135–145)
Total Bilirubin: 0.8 mg/dL (ref 0.3–1.2)
Total Protein: 7.4 g/dL (ref 6.5–8.1)

## 2019-06-22 LAB — TSH: TSH: 0.825 u[IU]/mL (ref 0.320–4.118)

## 2019-06-22 MED ORDER — SODIUM CHLORIDE 0.9 % IV SOLN
Freq: Once | INTRAVENOUS | Status: AC
  Filled 2019-06-22: qty 250

## 2019-06-22 MED ORDER — SODIUM CHLORIDE 0.9% FLUSH
10.0000 mL | INTRAVENOUS | Status: DC | PRN
  Administered 2019-06-22: 10 mL
  Filled 2019-06-22: qty 10

## 2019-06-22 MED ORDER — SODIUM CHLORIDE 0.9 % IV SOLN
200.0000 mg | Freq: Once | INTRAVENOUS | Status: AC
  Administered 2019-06-22: 200 mg via INTRAVENOUS
  Filled 2019-06-22: qty 8

## 2019-06-22 MED ORDER — HEPARIN SOD (PORK) LOCK FLUSH 100 UNIT/ML IV SOLN
500.0000 [IU] | Freq: Once | INTRAVENOUS | Status: AC | PRN
  Administered 2019-06-22: 500 [IU]
  Filled 2019-06-22: qty 5

## 2019-06-22 NOTE — Patient Instructions (Signed)
[facility] Discharge Instructions for Patients Receiving Chemotherapy  Today you received the following chemotherapy agents:  Keytruda.  To help prevent nausea and vomiting after your treatment, we encourage you to take your nausea medication as directed.   If you develop nausea and vomiting that is not controlled by your nausea medication, call the clinic.   BELOW ARE SYMPTOMS THAT SHOULD BE REPORTED IMMEDIATELY:  *FEVER GREATER THAN 100.5 F  *CHILLS WITH OR WITHOUT FEVER  NAUSEA AND VOMITING THAT IS NOT CONTROLLED WITH YOUR NAUSEA MEDICATION  *UNUSUAL SHORTNESS OF BREATH  *UNUSUAL BRUISING OR BLEEDING  TENDERNESS IN MOUTH AND THROAT WITH OR WITHOUT PRESENCE OF ULCERS  *URINARY PROBLEMS  *BOWEL PROBLEMS  UNUSUAL RASH Items with * indicate a potential emergency and should be followed up as soon as possible.  Feel free to call the clinic should you have any questions or concerns. The clinic phone number is [phone].  Please show the CHEMO ALERT CARD at check-in to the Emergency Department and triage nurse.    

## 2019-06-22 NOTE — Progress Notes (Signed)
Hematology and Oncology Follow Up Visit  [name] [id number] [birth year] 74 y.o. 06/22/2019 12:47 PM [name] Russell, DO   Principle Diagnosis: 74 year old man with renal cell carcinoma diagnosed in April 2020.  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   2.  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   Prior Therapy:   Therapeutic phlebotomy as needed.  He status post radical nephrectomy April 2020.  The final pathology showed clear cell histology invading into the perineural fat indicating stage T3a.  Tumor margins are negative.  He status post orchiectomy completed on 10/29/2018.  He final pathology showed metastatic carcinoma consistent with clear cell histology.  Current therapy:   Phlebotomy every 6 months to keep his hematocrit less than 45.  Pembrolizumab 200 mg every 3 weeks started on 01/26/2019.  Axitinib 5 mg daily since December 2020 due to side effects.  Interim History:  [name] presents today for a follow-up visit.  Since her last visit, he reports no major changes in his health.  He does report occasional pruritus and fatigue which has impacted his quality of life.  He reports the symptoms are sporadic and intermittent in nature.  Few days he is able to perform a lot of activities of daily living but he has to rest a day or 2 after the.  He denies any shortness of breath or difficulty breathing.  He denies any weight loss.  His performance status and activity level remained stable    .               Medications: Updated on review. Current Outpatient Medications  Medication Sig Dispense Refill  . busPIRone (BUSPAR) 5 MG tablet Take 5 mg by mouth 2 (two) times daily as needed (for anxiety).    . fluticasone (FLONASE) 50 MCG/ACT nasal spray Place 1 spray into both nostrils daily as needed for allergies.     . hydrOXYzine (ATARAX/VISTARIL) 10 MG tablet Take 1 tablet (10 mg total) by mouth 3  (three) times daily as needed. 30 tablet 3  . INLYTA 5 MG tablet TAKE 1 TABLET BY MOUTH TWICE DAILY. TAKE 12 HOURS APART WITH WATER, WITH OR WITHOUT FOOD 60 tablet 0  . lidocaine-prilocaine (EMLA) cream Apply 1 application topically as needed. 30 g 0  . LORazepam (ATIVAN) 1 MG tablet Take 1 tablet (1 mg total) by mouth every 8 (eight) hours. 30 tablet 0  . losartan-hydrochlorothiazide (HYZAAR) 100-25 MG tablet Take 1 tablet by mouth daily.    . meloxicam (MOBIC) 15 MG tablet Take 15 mg by mouth as needed for pain.    Marland Kitchen oxyCODONE-acetaminophen (PERCOCET) 5-325 MG tablet Take 1-2 tablets by mouth every 8 (eight) hours as needed for moderate pain or severe pain. Post-operatively 20 tablet 0  . pravastatin (PRAVACHOL) 40 MG tablet Take 40 mg by mouth daily.    . prochlorperazine (COMPAZINE) 10 MG tablet Take 1 tablet (10 mg total) by mouth every 6 (six) hours as needed for nausea or vomiting. 30 tablet 0   No current facility-administered medications for this visit.     Allergies:  Allergies  Allergen Reactions  . Oxycodone Other (See Comments)    Patient stated,"it keeps me wide awake and I can't sleep."  . Septra [Sulfamethoxazole-Trimethoprim] Hives and Itching      Physical exam:    Blood pressure 130/83, pulse 63, temperature 98.7 F (37.1 C), temperature source Temporal, resp. rate 18, height 5\' 9"  (1.753 m), weight 186  lb 6.4 oz (84.6 kg), SpO2 98 %.        ECOG: 0    General appearance: Comfortable appearing without any discomfort Head: Normocephalic without any trauma Oropharynx: Mucous membranes are moist and pink without any thrush or ulcers. Eyes: Pupils are equal and round reactive to light. Lymph nodes: No cervical, supraclavicular, inguinal or axillary lymphadenopathy.   Heart:regular rate and rhythm.  S1 and S2 without leg edema. Lung: Clear without any rhonchi or wheezes.  No dullness to percussion. Abdomin: Soft, nontender, nondistended with good bowel  sounds.  No hepatosplenomegaly. Musculoskeletal: No joint deformity or effusion.  Full range of motion noted. Neurological: No deficits noted on motor, sensory and deep tendon reflex exam. Skin: No petechial rash or dryness.  Appeared moist.                Lab Results: Lab Results  Component Value Date   WBC 6.0 05/31/2019   HGB 17.0 05/31/2019   HCT 51.1 05/31/2019   MCV 80.2 05/31/2019   PLT 242 05/31/2019     Chemistry      Component Value Date/Time   NA 138 05/31/2019 1135   NA 141 01/18/2015 0835   K 4.1 05/31/2019 1135   K 4.1 01/18/2015 0835   CL 105 05/31/2019 1135   CO2 26 05/31/2019 1135   CO2 25 01/18/2015 0835   BUN 18 05/31/2019 1135   BUN 21.5 01/18/2015 0835   CREATININE 1.60 (H) 05/31/2019 1135   CREATININE 1.1 01/18/2015 0835      Component Value Date/Time   CALCIUM 9.2 05/31/2019 1135   CALCIUM 9.8 01/18/2015 0835   ALKPHOS 62 05/31/2019 1135   ALKPHOS 66 01/18/2015 0835   AST 18 05/31/2019 1135   AST 15 01/18/2015 0835   ALT 11 05/31/2019 1135   ALT 16 01/18/2015 0835   BILITOT 0.7 05/31/2019 1135   BILITOT 0.91 01/18/2015 0835      I  Impression and Plan:  74 year old man with:    1.  Clear-cell renal cell carcinoma diagnosed in 2020.  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   He remains on pembrolizumab and axitinib without any new complaints at this time.  The natural course of this disease was reviewed.  Alternative treatment options were also discussed.  I recommended continuing the same treatment for the time being with repeat imaging studies in 3 weeks.  He is experiencing fatigue and pruritus which has been bothersome for him and he will be in favor of changing therapy especially if his disease is not responding imaging studies.   2.  Hypertension: Blood pressure is under reasonable control at this time.  We will continue to monitor axitinib.   3.   Secondary polycythemia: Related to his renal neoplasm.  Myeloproliferative disorder could also be a possibility although his work-up has been unrevealing.  4.  IV access: Port-A-Cath continues to be used without any issues.  5.  Prognosis and goals of care: His disease is incurable although aggressive measures are warranted given his excellent performance status.  6.  Immune mediated complications: Continue to educate him about potential complications including pneumonitis, colitis, thyroid disease among others.  He is experiencing more histamine release including pruritus which is manageable at this time although at times bothersome.  He does use hydroxyzine which helps at this time.  7. Followup: In 3 weeks for repeat follow-up and next infusion of Pembrolizumab.   30 minutes  were spent on this encounter.  Time was spent on updating his disease status, future treatment options as well as answering questions regarding future plan of care.  Zola Button, MD 4/8/202112:47 PM

## 2019-06-22 NOTE — Patient Instructions (Signed)

## 2019-06-23 ENCOUNTER — Telehealth: Payer: Self-pay | Admitting: Oncology

## 2019-06-23 NOTE — Telephone Encounter (Signed)
Scheduled appt per 4/8 los. °

## 2019-07-07 NOTE — Progress Notes (Signed)

## 2019-07-11 ENCOUNTER — Other Ambulatory Visit: Payer: Self-pay

## 2019-07-11 ENCOUNTER — Encounter (HOSPITAL_COMMUNITY): Payer: Self-pay

## 2019-07-11 ENCOUNTER — Ambulatory Visit (HOSPITAL_COMMUNITY)
Admission: RE | Admit: 2019-07-11 | Discharge: 2019-07-11 | Disposition: A | Discharge status: Home / Self Care | Payer: Medicare Other | Source: Ambulatory Visit | Attending: Oncology | Admitting: Oncology

## 2019-07-11 DIAGNOSIS — C641 Malignant neoplasm of right kidney, except renal pelvis: Secondary | ICD-10-CM | POA: Diagnosis not present

## 2019-07-11 DIAGNOSIS — R918 Other nonspecific abnormal finding of lung field: Secondary | ICD-10-CM | POA: Insufficient documentation

## 2019-07-11 DIAGNOSIS — N2889 Other specified disorders of kidney and ureter: Secondary | ICD-10-CM | POA: Diagnosis not present

## 2019-07-11 DIAGNOSIS — C649 Malignant neoplasm of unspecified kidney, except renal pelvis: Secondary | ICD-10-CM | POA: Diagnosis not present

## 2019-07-11 HISTORY — DX: Malignant neoplasm of unspecified kidney, except renal pelvis: C64.9

## 2019-07-11 MED ORDER — SODIUM CHLORIDE (PF) 0.9 % IJ SOLN
INTRAMUSCULAR | Status: AC
  Filled 2019-07-11: qty 50

## 2019-07-11 MED ORDER — IOHEXOL 300 MG/ML  SOLN
100.0000 mL | Freq: Once | INTRAMUSCULAR | Status: AC | PRN
  Administered 2019-07-11: 100 mL via INTRAVENOUS

## 2019-07-12 ENCOUNTER — Other Ambulatory Visit: Payer: Self-pay | Admitting: Oncology

## 2019-07-12 DIAGNOSIS — C649 Malignant neoplasm of unspecified kidney, except renal pelvis: Secondary | ICD-10-CM

## 2019-07-13 ENCOUNTER — Other Ambulatory Visit: Payer: Self-pay

## 2019-07-13 ENCOUNTER — Inpatient Hospital Stay (HOSPITAL_BASED_OUTPATIENT_CLINIC_OR_DEPARTMENT_OTHER): Payer: Medicare Other | Admitting: Oncology

## 2019-07-13 ENCOUNTER — Inpatient Hospital Stay: Payer: Medicare Other

## 2019-07-13 VITALS — BP 124/86 | HR 69 | Temp 98.9°F | Resp 18 | Ht 69.0 in | Wt 188.3 lb

## 2019-07-13 DIAGNOSIS — C649 Malignant neoplasm of unspecified kidney, except renal pelvis: Secondary | ICD-10-CM

## 2019-07-13 DIAGNOSIS — C787 Secondary malignant neoplasm of liver and intrahepatic bile duct: Secondary | ICD-10-CM | POA: Diagnosis not present

## 2019-07-13 DIAGNOSIS — Z95828 Presence of other vascular implants and grafts: Secondary | ICD-10-CM

## 2019-07-13 DIAGNOSIS — Z905 Acquired absence of kidney: Secondary | ICD-10-CM | POA: Diagnosis not present

## 2019-07-13 DIAGNOSIS — Z9079 Acquired absence of other genital organ(s): Secondary | ICD-10-CM | POA: Diagnosis not present

## 2019-07-13 DIAGNOSIS — D751 Secondary polycythemia: Secondary | ICD-10-CM | POA: Diagnosis not present

## 2019-07-13 DIAGNOSIS — Z5112 Encounter for antineoplastic immunotherapy: Secondary | ICD-10-CM | POA: Diagnosis not present

## 2019-07-13 LAB — CBC WITH DIFFERENTIAL (CANCER CENTER ONLY)
Abs Immature Granulocytes: 0.01 10*3/uL (ref 0.00–0.07)
Basophils Absolute: 0.1 10*3/uL (ref 0.0–0.1)
Basophils Relative: 1 %
Eosinophils Absolute: 0.6 10*3/uL — ABNORMAL HIGH (ref 0.0–0.5)
Eosinophils Relative: 12 %
HCT: 47.5 % (ref 39.0–52.0)
Hemoglobin: 15.2 g/dL (ref 13.0–17.0)
Immature Granulocytes: 0 %
Lymphocytes Relative: 31 %
Lymphs Abs: 1.6 10*3/uL (ref 0.7–4.0)
MCH: 26.3 pg (ref 26.0–34.0)
MCHC: 32 g/dL (ref 30.0–36.0)
MCV: 82 fL (ref 80.0–100.0)
Monocytes Absolute: 0.6 10*3/uL (ref 0.1–1.0)
Monocytes Relative: 11 %
Neutro Abs: 2.4 10*3/uL (ref 1.7–7.7)
Neutrophils Relative %: 45 %
Platelet Count: 202 10*3/uL (ref 150–400)
RBC: 5.79 MIL/uL (ref 4.22–5.81)
RDW: 13.2 % (ref 11.5–15.5)
WBC Count: 5.3 10*3/uL (ref 4.0–10.5)
nRBC: 0 % (ref 0.0–0.2)

## 2019-07-13 LAB — CMP (CANCER CENTER ONLY)
ALT: 12 U/L (ref 0–44)
AST: 21 U/L (ref 15–41)
Albumin: 3.8 g/dL (ref 3.5–5.0)
Alkaline Phosphatase: 56 U/L (ref 38–126)
Anion gap: 8 (ref 5–15)
BUN: 22 mg/dL (ref 8–23)
CO2: 24 mmol/L (ref 22–32)
Calcium: 8.8 mg/dL — ABNORMAL LOW (ref 8.9–10.3)
Chloride: 106 mmol/L (ref 98–111)
Creatinine: 1.51 mg/dL — ABNORMAL HIGH (ref 0.61–1.24)
GFR, Est AFR Am: 53 mL/min — ABNORMAL LOW (ref >60)
GFR, Estimated: 45 mL/min — ABNORMAL LOW (ref >60)
Glucose, Bld: 93 mg/dL (ref 70–99)
Potassium: 4.5 mmol/L (ref 3.5–5.1)
Sodium: 138 mmol/L (ref 135–145)
Total Bilirubin: 0.6 mg/dL (ref 0.3–1.2)
Total Protein: 7.3 g/dL (ref 6.5–8.1)

## 2019-07-13 MED ORDER — HEPARIN SOD (PORK) LOCK FLUSH 100 UNIT/ML IV SOLN
500.0000 [IU] | Freq: Once | INTRAVENOUS | Status: AC | PRN
  Administered 2019-07-13: 500 [IU]
  Filled 2019-07-13: qty 5

## 2019-07-13 MED ORDER — SODIUM CHLORIDE 0.9 % IV SOLN
200.0000 mg | Freq: Once | INTRAVENOUS | Status: AC
  Administered 2019-07-13: 200 mg via INTRAVENOUS
  Filled 2019-07-13: qty 8

## 2019-07-13 MED ORDER — SODIUM CHLORIDE 0.9% FLUSH
10.0000 mL | INTRAVENOUS | Status: DC | PRN
  Administered 2019-07-13: 10 mL
  Filled 2019-07-13: qty 10

## 2019-07-13 MED ORDER — SODIUM CHLORIDE 0.9% FLUSH
10.0000 mL | INTRAVENOUS | Status: DC | PRN
  Administered 2019-07-13: 10 mL via INTRAVENOUS
  Filled 2019-07-13: qty 10

## 2019-07-13 MED ORDER — SODIUM CHLORIDE 0.9 % IV SOLN
Freq: Once | INTRAVENOUS | Status: AC
  Filled 2019-07-13: qty 250

## 2019-07-13 NOTE — Progress Notes (Signed)
Per Dr. Alen Blew, Patient ok to receive Keytruda today. No phlebotomy today.

## 2019-07-13 NOTE — Progress Notes (Signed)
Hematology and Oncology Follow Up Visit  LORENTZ TAINTER [id number] Jul 03, 1945 74 y.o. 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     2.  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   Prior Therapy:   Therapeutic phlebotomy as needed.  He status post radical nephrectomy April 2020.  The final pathology showed clear cell histology invading into the perineural fat indicating stage T3a.  Tumor margins are negative.  He status post orchiectomy completed on 10/29/2018.  He final pathology showed metastatic carcinoma consistent with clear cell histology.  Current therapy:   Phlebotomy every 6 months to keep his hematocrit less than 45.  Pembrolizumab 200 mg every 3 weeks started on 01/26/2019.  Axitinib 5 mg daily since December 2020 due to side effects.  Interim History:  Mr. Wiggans is here for return evaluation.  Since the last visit, he reports no major changes in his health.  He does report more fatigue and tiredness associated with his treatment with still able to attend to activities of daily living.  He does report some abdominal distention and diarrhea at times.  He denies any fevers, chills sweats or weight loss.  He denies any shortness of breath or difficulty breathing.  He does report decrease in his exercise tolerance.    .               Medications: Unchanged on review. Current Outpatient Medications  Medication Sig Dispense Refill  . busPIRone (BUSPAR) 5 MG tablet Take 5 mg by mouth 2 (two) times daily as needed (for anxiety).    . fluticasone (FLONASE) 50 MCG/ACT nasal spray Place 1 spray into both nostrils daily as needed for allergies.     . hydrOXYzine (ATARAX/VISTARIL) 10 MG tablet Take 1 tablet (10 mg total) by mouth 3 (three) times daily as needed. 30 tablet 3  . INLYTA 5 MG tablet TAKE 1 TABLET  BY MOUTH TWICE DAILY. TAKE 12 HOURS APART WITH WATER, WITH OR WITHOUT FOOD 60 tablet 0  . lidocaine-prilocaine (EMLA) cream Apply 1 application topically as needed. 30 g 0  . LORazepam (ATIVAN) 1 MG tablet Take 1 tablet (1 mg total) by mouth every 8 (eight) hours. 30 tablet 0  . losartan-hydrochlorothiazide (HYZAAR) 100-25 MG tablet Take 1 tablet by mouth daily.    . meloxicam (MOBIC) 15 MG tablet Take 15 mg by mouth as needed for pain.    Marland Kitchen oxyCODONE-acetaminophen (PERCOCET) 5-325 MG tablet Take 1-2 tablets by mouth every 8 (eight) hours as needed for moderate pain or severe pain. Post-operatively 20 tablet 0  . pravastatin (PRAVACHOL) 40 MG tablet Take 40 mg by mouth daily.    . prochlorperazine (COMPAZINE) 10 MG tablet Take 1 tablet (10 mg total) by mouth every 6 (six) hours as needed for nausea or vomiting. 30 tablet 0   No current facility-administered medications for this visit.   Facility-Administered Medications Ordered in Other Visits  Medication Dose Route Frequency Provider Last Rate Last Admin  . sodium chloride flush (NS) 0.9 % injection 10 mL  10 mL Intravenous PRN [name], MD   10 mL at 07/13/19 1231     Allergies:  Allergies  Allergen Reactions  . Oxycodone Other (See Comments)    Patient stated,"it keeps me wide awake and I can't sleep."  . Septra [Sulfamethoxazole-Trimethoprim] Hives and Itching      Physical  exam:    Blood pressure 124/86, pulse 69, temperature 98.9 F (37.2 C), temperature source Temporal, resp. rate 18, height 5\' 9"  (1.753 m), weight 188 lb 4.8 oz (85.4 kg), SpO2 98 %.         ECOG: 0    General appearance: Alert, awake without any distress. Head: Atraumatic without abnormalities Oropharynx: Without any thrush or ulcers. Eyes: No scleral icterus. Lymph nodes: No lymphadenopathy noted in the cervical, supraclavicular, or axillary nodes Heart:regular rate and rhythm, without any murmurs or gallops.   Lung: Clear to  auscultation without any rhonchi, wheezes or dullness to percussion. Abdomin: Soft, nontender without any shifting dullness or ascites. Musculoskeletal: No clubbing or cyanosis. Neurological: No motor or sensory deficits. Skin: No rashes or lesions.                 Lab Results: Lab Results  Component Value Date   WBC 6.2 06/22/2019   HGB 14.9 06/22/2019   HCT 44.8 06/22/2019   MCV 80.3 06/22/2019   PLT 212 06/22/2019     Chemistry      Component Value Date/Time   NA 138 06/22/2019 1244   NA 141 01/18/2015 0835   K 4.1 06/22/2019 1244   K 4.1 01/18/2015 0835   CL 105 06/22/2019 1244   CO2 25 06/22/2019 1244   CO2 25 01/18/2015 0835   BUN 20 06/22/2019 1244   BUN 21.5 01/18/2015 0835   CREATININE 1.40 (H) 06/22/2019 1244   CREATININE 1.1 01/18/2015 0835      Component Value Date/Time   CALCIUM 9.1 06/22/2019 1244   CALCIUM 9.8 01/18/2015 0835   ALKPHOS 58 06/22/2019 1244   ALKPHOS 66 01/18/2015 0835   AST 18 06/22/2019 1244   AST 15 01/18/2015 0835   ALT 12 06/22/2019 1244   ALT 16 01/18/2015 0835   BILITOT 0.8 06/22/2019 1244   BILITOT 0.91 01/18/2015 0835     IMPRESSION: 1. Continued positive response to therapy. Solitary right liver metastasis has resolved. Tiny residual soft tissue nodule in the right lower quadrant along the course of the right testicular vein, decreased. 2. No new or progressive metastatic disease in the chest, abdomen or pelvis. 3. Aortic Atherosclerosis (ICD10-I70.0) and Emphysema (ICD10-J43.9). Additional chronic findings as detailed.  I  Impression and Plan:  74 year old man with:    1.  Stage IV clear-cell renal cell carcinoma diagnosed in 2020.  He was found to have hepatic metastasis as well as involvement of the spermatic cord.Marland Kitchen   He is currently receiving Pembrolizumab and axitinib without any major complications.  CT scan obtained on July 11, 2019 was personally reviewed and showed positive response to  therapy.  His hepatic metastasis has resolved without any new areas developed.  Very small residual soft tissue nodule in the right lower quadrant of his abdomen around the testicular pain have also decreased.  Risks and benefits of continuing therapy was reviewed.  Alternative options with maintenance therapy with Pembrolizumab alone for axitinib alone was discussed.  After discussion today, we have opted to discontinue axitinib given the complications if experiencing.  We can potentially discontinue Pembrolizumab as well pending his symptoms and evaluation in 3 weeks.  He is not bothered by the side effects and his quality of life at this time and given his excellent scan results it would be reasonable to back off some treatment at this time.   2.  Hypertension: This has been elevated on axitinib.  Blood pressure is at normal range at  this time.   3.  Secondary polycythemia: Related to malignancy.  He has been receiving intermittent phlebotomy with improvement in his symptoms.  He does not require phlebotomy today.  4.  IV access: Port-A-Cath currently in use without any issues.  5.  Prognosis and goals of care: His disease remains incurable but aggressive measures are warranted.  He is experiencing excellent response to therapy and aggressive measures are warranted.  6.  Immune mediated complications: He has not experienced any pneumonitis, colitis or thyroid disease.  He does have issue with pruritus manageable with hydroxyzine.  Continue to reiterate potential complication associated with immunotherapy.  7. Followup: In 3 weeks for the next evaluation.   30 minutes were dedicated to this visit.  The time was spent on reviewing his disease status, imaging studies treatment options and addressing complications related to therapy.  Zola Button, MD 4/29/202112:39 PM

## 2019-07-13 NOTE — Progress Notes (Signed)
Pt's creatnine is 1.51 today - ok to treat per Dr. Alen Blew.

## 2019-07-13 NOTE — Patient Instructions (Signed)
[facility] Discharge Instructions for Patients Receiving Chemotherapy  Today you received the following chemotherapy agents:  Keytruda.  To help prevent nausea and vomiting after your treatment, we encourage you to take your nausea medication as directed.   If you develop nausea and vomiting that is not controlled by your nausea medication, call the clinic.   BELOW ARE SYMPTOMS THAT SHOULD BE REPORTED IMMEDIATELY:  *FEVER GREATER THAN 100.5 F  *CHILLS WITH OR WITHOUT FEVER  NAUSEA AND VOMITING THAT IS NOT CONTROLLED WITH YOUR NAUSEA MEDICATION  *UNUSUAL SHORTNESS OF BREATH  *UNUSUAL BRUISING OR BLEEDING  TENDERNESS IN MOUTH AND THROAT WITH OR WITHOUT PRESENCE OF ULCERS  *URINARY PROBLEMS  *BOWEL PROBLEMS  UNUSUAL RASH Items with * indicate a potential emergency and should be followed up as soon as possible.  Feel free to call the clinic should you have any questions or concerns. The clinic phone number is [phone].  Please show the CHEMO ALERT CARD at check-in to the Emergency Department and triage nurse.    

## 2019-07-28 NOTE — Progress Notes (Signed)
Pharmacist Chemotherapy Monitoring - Follow Up Assessment    I verify that I have reviewed each item in the below checklist:  . Regimen for the patient is scheduled for the appropriate day and plan matches scheduled date. Marland Kitchen Appropriate non-routine labs are ordered dependent on drug ordered. . If applicable, additional medications reviewed and ordered per protocol based on lifetime cumulative doses and/or treatment regimen.   Plan for follow-up and/or issues identified: No . I-vent associated with next due treatment: No . MD and/or nursing notified: No  [name] K 07/28/2019 9:39 AM

## 2019-08-03 ENCOUNTER — Inpatient Hospital Stay: Payer: Medicare Other | Attending: Oncology

## 2019-08-03 ENCOUNTER — Inpatient Hospital Stay: Payer: Medicare Other

## 2019-08-03 ENCOUNTER — Inpatient Hospital Stay (HOSPITAL_BASED_OUTPATIENT_CLINIC_OR_DEPARTMENT_OTHER): Payer: Medicare Other | Admitting: Oncology

## 2019-08-03 ENCOUNTER — Other Ambulatory Visit: Payer: Self-pay

## 2019-08-03 VITALS — BP 126/75 | HR 68 | Temp 98.4°F | Resp 17 | Ht 69.0 in | Wt 189.4 lb

## 2019-08-03 DIAGNOSIS — I10 Essential (primary) hypertension: Secondary | ICD-10-CM | POA: Diagnosis not present

## 2019-08-03 DIAGNOSIS — Z5112 Encounter for antineoplastic immunotherapy: Secondary | ICD-10-CM | POA: Insufficient documentation

## 2019-08-03 DIAGNOSIS — Z791 Long term (current) use of non-steroidal anti-inflammatories (NSAID): Secondary | ICD-10-CM | POA: Diagnosis not present

## 2019-08-03 DIAGNOSIS — C649 Malignant neoplasm of unspecified kidney, except renal pelvis: Secondary | ICD-10-CM

## 2019-08-03 DIAGNOSIS — C7982 Secondary malignant neoplasm of genital organs: Secondary | ICD-10-CM | POA: Insufficient documentation

## 2019-08-03 DIAGNOSIS — D751 Secondary polycythemia: Secondary | ICD-10-CM

## 2019-08-03 DIAGNOSIS — E039 Hypothyroidism, unspecified: Secondary | ICD-10-CM

## 2019-08-03 DIAGNOSIS — Z79899 Other long term (current) drug therapy: Secondary | ICD-10-CM | POA: Insufficient documentation

## 2019-08-03 DIAGNOSIS — Z95828 Presence of other vascular implants and grafts: Secondary | ICD-10-CM

## 2019-08-03 DIAGNOSIS — Z9079 Acquired absence of other genital organ(s): Secondary | ICD-10-CM | POA: Insufficient documentation

## 2019-08-03 DIAGNOSIS — C7800 Secondary malignant neoplasm of unspecified lung: Secondary | ICD-10-CM | POA: Insufficient documentation

## 2019-08-03 DIAGNOSIS — Z905 Acquired absence of kidney: Secondary | ICD-10-CM | POA: Diagnosis not present

## 2019-08-03 LAB — CBC WITH DIFFERENTIAL (CANCER CENTER ONLY)
Abs Immature Granulocytes: 0.03 10*3/uL (ref 0.00–0.07)
Basophils Absolute: 0.1 10*3/uL (ref 0.0–0.1)
Basophils Relative: 1 %
Eosinophils Absolute: 0.8 10*3/uL — ABNORMAL HIGH (ref 0.0–0.5)
Eosinophils Relative: 13 %
HCT: 47.2 % (ref 39.0–52.0)
Hemoglobin: 15.1 g/dL (ref 13.0–17.0)
Immature Granulocytes: 1 %
Lymphocytes Relative: 22 %
Lymphs Abs: 1.4 10*3/uL (ref 0.7–4.0)
MCH: 26.3 pg (ref 26.0–34.0)
MCHC: 32 g/dL (ref 30.0–36.0)
MCV: 82.2 fL (ref 80.0–100.0)
Monocytes Absolute: 0.7 10*3/uL (ref 0.1–1.0)
Monocytes Relative: 11 %
Neutro Abs: 3.3 10*3/uL (ref 1.7–7.7)
Neutrophils Relative %: 52 %
Platelet Count: 223 10*3/uL (ref 150–400)
RBC: 5.74 MIL/uL (ref 4.22–5.81)
RDW: 13.7 % (ref 11.5–15.5)
WBC Count: 6.4 10*3/uL (ref 4.0–10.5)
nRBC: 0 % (ref 0.0–0.2)

## 2019-08-03 LAB — CMP (CANCER CENTER ONLY)
ALT: 12 U/L (ref 0–44)
AST: 16 U/L (ref 15–41)
Albumin: 3.7 g/dL (ref 3.5–5.0)
Alkaline Phosphatase: 55 U/L (ref 38–126)
Anion gap: 8 (ref 5–15)
BUN: 27 mg/dL — ABNORMAL HIGH (ref 8–23)
CO2: 26 mmol/L (ref 22–32)
Calcium: 8.8 mg/dL — ABNORMAL LOW (ref 8.9–10.3)
Chloride: 105 mmol/L (ref 98–111)
Creatinine: 1.65 mg/dL — ABNORMAL HIGH (ref 0.61–1.24)
GFR, Est AFR Am: 47 mL/min — ABNORMAL LOW (ref >60)
GFR, Estimated: 41 mL/min — ABNORMAL LOW (ref >60)
Glucose, Bld: 102 mg/dL — ABNORMAL HIGH (ref 70–99)
Potassium: 4.2 mmol/L (ref 3.5–5.1)
Sodium: 139 mmol/L (ref 135–145)
Total Bilirubin: 0.5 mg/dL (ref 0.3–1.2)
Total Protein: 7 g/dL (ref 6.5–8.1)

## 2019-08-03 LAB — TOTAL PROTEIN, URINE DIPSTICK: Protein, ur: NEGATIVE mg/dL

## 2019-08-03 LAB — TSH: TSH: 0.495 u[IU]/mL (ref 0.320–4.118)

## 2019-08-03 MED ORDER — SODIUM CHLORIDE 0.9% FLUSH
10.0000 mL | INTRAVENOUS | Status: DC | PRN
  Administered 2019-08-03: 10 mL via INTRAVENOUS
  Filled 2019-08-03: qty 10

## 2019-08-03 MED ORDER — SODIUM CHLORIDE 0.9% FLUSH
10.0000 mL | INTRAVENOUS | Status: DC | PRN
  Administered 2019-08-03: 10 mL
  Filled 2019-08-03: qty 10

## 2019-08-03 MED ORDER — HEPARIN SOD (PORK) LOCK FLUSH 100 UNIT/ML IV SOLN
500.0000 [IU] | Freq: Once | INTRAVENOUS | Status: AC | PRN
  Administered 2019-08-03: 500 [IU]
  Filled 2019-08-03: qty 5

## 2019-08-03 MED ORDER — SODIUM CHLORIDE 0.9 % IV SOLN
200.0000 mg | Freq: Once | INTRAVENOUS | Status: AC
  Administered 2019-08-03: 200 mg via INTRAVENOUS
  Filled 2019-08-03: qty 8

## 2019-08-03 MED ORDER — SODIUM CHLORIDE 0.9 % IV SOLN
Freq: Once | INTRAVENOUS | Status: AC
  Filled 2019-08-03: qty 250

## 2019-08-03 NOTE — Progress Notes (Signed)
Hematology and Oncology Follow Up Visit  [name] [id number] 07/17/1945 74 y.o. 08/03/2019 12:52 PM [name], DO   Principle Diagnosis: 74 year old man with renal cell carcinoma diagnosed in April 2020.  He developed stage IV disease with hepatic and spermatic cord involvement.   2.  Polycythemia related to malignancy diagnosed in 2020.    Prior Therapy:   Therapeutic phlebotomy as needed.  He status post radical nephrectomy April 2020.  The final pathology showed clear cell histology invading into the perineural fat indicating stage T3a.  Tumor margins are negative.  He status post orchiectomy completed on 10/29/2018.  He final pathology showed metastatic carcinoma consistent with clear cell histology.  Current therapy:   Phlebotomy every 6 months to keep his hematocrit less than 45.  Pembrolizumab 200 mg every 3 weeks started on 01/26/2019.  Axitinib 5 mg daily discontinued in May 2021 for better tolerance.  Interim History:  [name] is here for repeat evaluation.  Since the last visit, he reports improvement in his symptoms since the last visit after stopping axitinib.  He is appetite and GI symptoms have improved.  He does report improvement in his fatigue and tiredness.  He was able to participate in outdoor activities including fishing although he does report some level of fatigue.  Continues to have issues with pruritus that is aggravating him at times.    .               Medications: Reviewed without any changes. Current Outpatient Medications  Medication Sig Dispense Refill  . busPIRone (BUSPAR) 5 MG tablet Take 5 mg by mouth 2 (two) times daily as needed (for anxiety).    . fluticasone (FLONASE) 50 MCG/ACT nasal spray Place 1 spray into both nostrils daily as needed for allergies.     . hydrOXYzine (ATARAX/VISTARIL) 10 MG tablet Take 1 tablet (10 mg total) by mouth 3 (three) times daily as needed. 30 tablet 3  . INLYTA 5 MG  tablet TAKE 1 TABLET BY MOUTH TWICE DAILY. TAKE 12 HOURS APART WITH WATER, WITH OR WITHOUT FOOD 60 tablet 0  . lidocaine-prilocaine (EMLA) cream Apply 1 application topically as needed. 30 g 0  . LORazepam (ATIVAN) 1 MG tablet Take 1 tablet (1 mg total) by mouth every 8 (eight) hours. 30 tablet 0  . losartan-hydrochlorothiazide (HYZAAR) 100-25 MG tablet Take 1 tablet by mouth daily.    . meloxicam (MOBIC) 15 MG tablet Take 15 mg by mouth as needed for pain.    Marland Kitchen oxyCODONE-acetaminophen (PERCOCET) 5-325 MG tablet Take 1-2 tablets by mouth every 8 (eight) hours as needed for moderate pain or severe pain. Post-operatively 20 tablet 0  . pravastatin (PRAVACHOL) 40 MG tablet Take 40 mg by mouth daily.    . prochlorperazine (COMPAZINE) 10 MG tablet Take 1 tablet (10 mg total) by mouth every 6 (six) hours as needed for nausea or vomiting. 30 tablet 0   No current facility-administered medications for this visit.   Facility-Administered Medications Ordered in Other Visits  Medication Dose Route Frequency Provider Last Rate Last Admin  . sodium chloride flush (NS) 0.9 % injection 10 mL  10 mL Intravenous PRN [name], MD   10 mL at 08/03/19 1244     Allergies:  Allergies  Allergen Reactions  . Oxycodone Other (See Comments)    Patient stated,"it keeps me wide awake and I can't sleep."  . Septra [Sulfamethoxazole-Trimethoprim] Hives and Itching      Physical exam:  Blood pressure 126/75, pulse 68, temperature 98.4 F (36.9 C), temperature source Temporal, resp. rate 17, height 5\' 9"  (1.753 m), weight 189 lb 6.4 oz (85.9 kg), SpO2 98 %.         ECOG: 0     General appearance: Comfortable appearing without any discomfort Head: Normocephalic without any trauma Oropharynx: Mucous membranes are moist and pink without any thrush or ulcers. Eyes: Pupils are equal and round reactive to light. Lymph nodes: No cervical, supraclavicular, inguinal or axillary lymphadenopathy.    Heart:regular rate and rhythm.  S1 and S2 without leg edema. Lung: Clear without any rhonchi or wheezes.  No dullness to percussion. Abdomin: Soft, nontender, nondistended with good bowel sounds.  No hepatosplenomegaly. Musculoskeletal: No joint deformity or effusion.  Full range of motion noted. Neurological: No deficits noted on motor, sensory and deep tendon reflex exam. Skin: No petechial rash or dryness.  Appeared moist.                    Lab Results: Lab Results  Component Value Date   WBC 5.3 07/13/2019   HGB 15.2 07/13/2019   HCT 47.5 07/13/2019   MCV 82.0 07/13/2019   PLT 202 07/13/2019     Chemistry      Component Value Date/Time   NA 138 07/13/2019 1231   NA 141 01/18/2015 0835   K 4.5 07/13/2019 1231   K 4.1 01/18/2015 0835   CL 106 07/13/2019 1231   CO2 24 07/13/2019 1231   CO2 25 01/18/2015 0835   BUN 22 07/13/2019 1231   BUN 21.5 01/18/2015 0835   CREATININE 1.51 (H) 07/13/2019 1231   CREATININE 1.1 01/18/2015 0835      Component Value Date/Time   CALCIUM 8.8 (L) 07/13/2019 1231   CALCIUM 9.8 01/18/2015 0835   ALKPHOS 56 07/13/2019 1231   ALKPHOS 66 01/18/2015 0835   AST 21 07/13/2019 1231   AST 15 01/18/2015 0835   ALT 12 07/13/2019 1231   ALT 16 01/18/2015 0835   BILITOT 0.6 07/13/2019 1231   BILITOT 0.91 01/18/2015 0835         Impression and Plan:  74 year old man with:    1.  Renal cell carcinoma diagnosed in 2020.  He developed stage IV clear-cell disease with pulmonary and spermatic cord involvement.   He is status post treatment with Pembrolizumab and axitinib with axitinib being on hold for better tolerance.  Imaging studies in April 2021 showed a near complete response with very limited disease.  Risks and benefits of continuing Pembrolizumab were reviewed today.  Alternative options would be active surveillance and reinstitute anticancer therapy as needed.   After discussion today, we have opted to continue  with Pembrolizumab for 2 more cycles and discontinue after that.  2.  Hypertension: Back to normal at this time after stopping axitinib.   3.  Secondary polycythemia: Hemoglobin remains under control at this time around 15.1.  He will not receive phlebotomy.  4.  IV access: Port-A-Cath remains in place and use without any issues.  5.  Prognosis and goals of care: Therapy remains palliative although aggressive measures are warranted given his excellent performance status.  6.  Immune mediated complications: I continue to educate him about potential complications including pneumonitis, colitis and thyroid disease.  He is not experiencing any at this time.  7. Followup: Will be in 3 weeks for repeat evaluation and Pembrolizumab infusion.   30 minutes were spent on this encounter.  The time was  dedicated to reviewing laboratory data, updating his disease status, discussing treatment options and future plan of care.  Zola Button, MD 5/20/202112:52 PM

## 2019-08-03 NOTE — Progress Notes (Signed)
Ok to treat with elevated creatinine today per MD Shadad.

## 2019-08-03 NOTE — Addendum Note (Signed)
Addended by: [name] on: 08/03/2019 01:13 PM   Modules accepted: Orders

## 2019-08-03 NOTE — Patient Instructions (Signed)
[facility] Discharge Instructions for Patients Receiving Chemotherapy  Today you received the following chemotherapy agent: Keytruda.  To help prevent nausea and vomiting after your treatment, we encourage you to take your nausea medication as directed.   If you develop nausea and vomiting that is not controlled by your nausea medication, call the clinic.   BELOW ARE SYMPTOMS THAT SHOULD BE REPORTED IMMEDIATELY:  *FEVER GREATER THAN 100.5 F  *CHILLS WITH OR WITHOUT FEVER  NAUSEA AND VOMITING THAT IS NOT CONTROLLED WITH YOUR NAUSEA MEDICATION  *UNUSUAL SHORTNESS OF BREATH  *UNUSUAL BRUISING OR BLEEDING  TENDERNESS IN MOUTH AND THROAT WITH OR WITHOUT PRESENCE OF ULCERS  *URINARY PROBLEMS  *BOWEL PROBLEMS  UNUSUAL RASH Items with * indicate a potential emergency and should be followed up as soon as possible.  Feel free to call the clinic should you have any questions or concerns. The clinic phone number is [phone].  Please show the CHEMO ALERT CARD at check-in to the Emergency Department and triage nurse.   

## 2019-08-04 ENCOUNTER — Telehealth: Payer: Self-pay | Admitting: Oncology

## 2019-08-04 NOTE — Telephone Encounter (Signed)
Scheduled appt per 5/20 los.  Spoke with pt and they are aware of the appt date and time. 

## 2019-08-17 NOTE — Progress Notes (Signed)
Pharmacist Chemotherapy Monitoring - Follow Up Assessment    I verify that I have reviewed each item in the below checklist:  . Regimen for the patient is scheduled for the appropriate day and plan matches scheduled date. Marland Kitchen Appropriate non-routine labs are ordered dependent on drug ordered. . If applicable, additional medications reviewed and ordered per protocol based on lifetime cumulative doses and/or treatment regimen.   Plan for follow-up and/or issues identified: No . I-vent associated with next due treatment: No . MD and/or nursing notified: No  Britt Boozer 08/17/2019 1:24 PM

## 2019-08-23 ENCOUNTER — Inpatient Hospital Stay: Payer: Medicare Other

## 2019-08-23 ENCOUNTER — Inpatient Hospital Stay (HOSPITAL_BASED_OUTPATIENT_CLINIC_OR_DEPARTMENT_OTHER): Payer: Medicare Other | Admitting: Oncology

## 2019-08-23 ENCOUNTER — Inpatient Hospital Stay: Payer: Medicare Other | Attending: Oncology

## 2019-08-23 ENCOUNTER — Other Ambulatory Visit: Payer: Self-pay

## 2019-08-23 VITALS — BP 133/81 | HR 72 | Temp 98.8°F | Resp 18 | Ht 69.0 in | Wt 189.0 lb

## 2019-08-23 DIAGNOSIS — C649 Malignant neoplasm of unspecified kidney, except renal pelvis: Secondary | ICD-10-CM

## 2019-08-23 DIAGNOSIS — C7982 Secondary malignant neoplasm of genital organs: Secondary | ICD-10-CM | POA: Diagnosis present

## 2019-08-23 DIAGNOSIS — Z9079 Acquired absence of other genital organ(s): Secondary | ICD-10-CM | POA: Insufficient documentation

## 2019-08-23 DIAGNOSIS — N2889 Other specified disorders of kidney and ureter: Secondary | ICD-10-CM

## 2019-08-23 DIAGNOSIS — Z79899 Other long term (current) drug therapy: Secondary | ICD-10-CM | POA: Insufficient documentation

## 2019-08-23 DIAGNOSIS — Z905 Acquired absence of kidney: Secondary | ICD-10-CM | POA: Insufficient documentation

## 2019-08-23 DIAGNOSIS — R918 Other nonspecific abnormal finding of lung field: Secondary | ICD-10-CM

## 2019-08-23 DIAGNOSIS — Z95828 Presence of other vascular implants and grafts: Secondary | ICD-10-CM

## 2019-08-23 DIAGNOSIS — D751 Secondary polycythemia: Secondary | ICD-10-CM | POA: Diagnosis not present

## 2019-08-23 DIAGNOSIS — Z5112 Encounter for antineoplastic immunotherapy: Secondary | ICD-10-CM | POA: Diagnosis present

## 2019-08-23 LAB — CBC WITH DIFFERENTIAL (CANCER CENTER ONLY)
Abs Immature Granulocytes: 0.02 10*3/uL (ref 0.00–0.07)
Basophils Absolute: 0.1 10*3/uL (ref 0.0–0.1)
Basophils Relative: 1 %
Eosinophils Absolute: 0.9 10*3/uL — ABNORMAL HIGH (ref 0.0–0.5)
Eosinophils Relative: 13 %
HCT: 50.5 % (ref 39.0–52.0)
Hemoglobin: 16.2 g/dL (ref 13.0–17.0)
Immature Granulocytes: 0 %
Lymphocytes Relative: 22 %
Lymphs Abs: 1.6 10*3/uL (ref 0.7–4.0)
MCH: 26.3 pg (ref 26.0–34.0)
MCHC: 32.1 g/dL (ref 30.0–36.0)
MCV: 82.1 fL (ref 80.0–100.0)
Monocytes Absolute: 0.8 10*3/uL (ref 0.1–1.0)
Monocytes Relative: 11 %
Neutro Abs: 3.7 10*3/uL (ref 1.7–7.7)
Neutrophils Relative %: 53 %
Platelet Count: 240 10*3/uL (ref 150–400)
RBC: 6.15 MIL/uL — ABNORMAL HIGH (ref 4.22–5.81)
RDW: 14.7 % (ref 11.5–15.5)
WBC Count: 7 10*3/uL (ref 4.0–10.5)
nRBC: 0 % (ref 0.0–0.2)

## 2019-08-23 LAB — CMP (CANCER CENTER ONLY)
ALT: 11 U/L (ref 0–44)
AST: 15 U/L (ref 15–41)
Albumin: 3.8 g/dL (ref 3.5–5.0)
Alkaline Phosphatase: 57 U/L (ref 38–126)
Anion gap: 9 (ref 5–15)
BUN: 23 mg/dL (ref 8–23)
CO2: 26 mmol/L (ref 22–32)
Calcium: 9.8 mg/dL (ref 8.9–10.3)
Chloride: 105 mmol/L (ref 98–111)
Creatinine: 1.62 mg/dL — ABNORMAL HIGH (ref 0.61–1.24)
GFR, Est AFR Am: 48 mL/min — ABNORMAL LOW
GFR, Estimated: 41 mL/min — ABNORMAL LOW
Glucose, Bld: 108 mg/dL — ABNORMAL HIGH (ref 70–99)
Potassium: 4.2 mmol/L (ref 3.5–5.1)
Sodium: 140 mmol/L (ref 135–145)
Total Bilirubin: 0.5 mg/dL (ref 0.3–1.2)
Total Protein: 7.4 g/dL (ref 6.5–8.1)

## 2019-08-23 MED ORDER — SODIUM CHLORIDE 0.9 % IV SOLN
Freq: Once | INTRAVENOUS | Status: AC
  Filled 2019-08-23: qty 250

## 2019-08-23 MED ORDER — SODIUM CHLORIDE 0.9% FLUSH
10.0000 mL | Freq: Once | INTRAVENOUS | Status: AC
  Administered 2019-08-23: 10 mL
  Filled 2019-08-23: qty 10

## 2019-08-23 MED ORDER — HEPARIN SOD (PORK) LOCK FLUSH 100 UNIT/ML IV SOLN
500.0000 [IU] | Freq: Once | INTRAVENOUS | Status: AC | PRN
  Administered 2019-08-23: 500 [IU]
  Filled 2019-08-23: qty 5

## 2019-08-23 MED ORDER — SODIUM CHLORIDE 0.9 % IV SOLN
200.0000 mg | Freq: Once | INTRAVENOUS | Status: AC
  Administered 2019-08-23: 200 mg via INTRAVENOUS
  Filled 2019-08-23: qty 8

## 2019-08-23 MED ORDER — SODIUM CHLORIDE 0.9% FLUSH
10.0000 mL | INTRAVENOUS | Status: DC | PRN
  Administered 2019-08-23: 10 mL
  Filled 2019-08-23: qty 10

## 2019-08-23 NOTE — Patient Instructions (Signed)
[facility] Discharge Instructions for Patients Receiving Chemotherapy  Today you received the following chemotherapy agent: Keytruda.  To help prevent nausea and vomiting after your treatment, we encourage you to take your nausea medication as directed.   If you develop nausea and vomiting that is not controlled by your nausea medication, call the clinic.   BELOW ARE SYMPTOMS THAT SHOULD BE REPORTED IMMEDIATELY:  *FEVER GREATER THAN 100.5 F  *CHILLS WITH OR WITHOUT FEVER  NAUSEA AND VOMITING THAT IS NOT CONTROLLED WITH YOUR NAUSEA MEDICATION  *UNUSUAL SHORTNESS OF BREATH  *UNUSUAL BRUISING OR BLEEDING  TENDERNESS IN MOUTH AND THROAT WITH OR WITHOUT PRESENCE OF ULCERS  *URINARY PROBLEMS  *BOWEL PROBLEMS  UNUSUAL RASH Items with * indicate a potential emergency and should be followed up as soon as possible.  Feel free to call the clinic should you have any questions or concerns. The clinic phone number is [phone].  Please show the CHEMO ALERT CARD at check-in to the Emergency Department and triage nurse.   

## 2019-08-23 NOTE — Progress Notes (Signed)
Hematology and Oncology Follow Up Visit  [name] [id number] 05-Jul-1945 74 y.o. 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    2.  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    Prior Therapy:   Therapeutic phlebotomy as needed.  He status post radical nephrectomy April 2020.  The final pathology showed clear cell histology invading into the perineural fat indicating stage T3a.  Tumor margins are negative.  He status post orchiectomy completed on 10/29/2018.  He final pathology showed metastatic carcinoma consistent with clear cell histology.   Axitinib 5 mg daily started in November 2020.  Therapy discontinued in May 2021 for better tolerance.     Current therapy:   Phlebotomy every 6 months to keep his hematocrit less than 45.  Pembrolizumab 200 mg every 3 weeks started on 01/26/2019.    Interim History:  [name] returns today for repeat evaluation.  Since last visit, he denies any new complaints or complications.  He continues to have pruritus and fatigue associated with [place].  He denies any nausea, vomiting or abdominal pain.  He denies any recent hospitalization illnesses.  He continues to perform activities of daily living without any decline.    .               Medications: Updated on review. Current Outpatient Medications  Medication Sig Dispense Refill  . busPIRone (BUSPAR) 5 MG tablet Take 5 mg by mouth 2 (two) times daily as needed (for anxiety).    . fluticasone (FLONASE) 50 MCG/ACT nasal spray Place 1 spray into both nostrils daily as needed for allergies.     . hydrOXYzine (ATARAX/VISTARIL) 10 MG tablet Take 1 tablet (10 mg total) by mouth 3 (three) times daily as needed. 30 tablet 3  . INLYTA 5 MG tablet TAKE 1 TABLET BY MOUTH TWICE DAILY. TAKE 12 HOURS APART WITH  WATER, WITH OR WITHOUT FOOD 60 tablet 0  . lidocaine-prilocaine (EMLA) cream Apply 1 application topically as needed. 30 g 0  . LORazepam (ATIVAN) 1 MG tablet Take 1 tablet (1 mg total) by mouth every 8 (eight) hours. 30 tablet 0  . losartan-hydrochlorothiazide (HYZAAR) 100-25 MG tablet Take 1 tablet by mouth daily.    . meloxicam (MOBIC) 15 MG tablet Take 15 mg by mouth as needed for pain.    Marland Kitchen oxyCODONE-acetaminophen (PERCOCET) 5-325 MG tablet Take 1-2 tablets by mouth every 8 (eight) hours as needed for moderate pain or severe pain. Post-operatively 20 tablet 0  . pravastatin (PRAVACHOL) 40 MG tablet Take 40 mg by mouth daily.    . prochlorperazine (COMPAZINE) 10 MG tablet Take 1 tablet (10 mg total) by mouth every 6 (six) hours as needed for nausea or vomiting. 30 tablet 0   No current facility-administered medications for this visit.     Allergies:  Allergies  Allergen Reactions  . Oxycodone Other (See Comments)    Patient stated,"it keeps me wide awake and I can't sleep."  . Septra [Sulfamethoxazole-Trimethoprim] Hives and Itching      Physical exam:         Blood pressure 133/81, pulse 72, temperature 98.8 F (37.1 C), temperature source Temporal, resp. rate 18, height 5\' 9"  (1.753 m), weight 189 lb (85.7 kg), SpO2 97 %.      ECOG: 0   General appearance: Alert, awake without any distress. Head: Atraumatic  without abnormalities Oropharynx: Without any thrush or ulcers. Eyes: No scleral icterus. Lymph nodes: No lymphadenopathy noted in the cervical, supraclavicular, or axillary nodes Heart:regular rate and rhythm, without any murmurs or gallops.   Lung: Clear to auscultation without any rhonchi, wheezes or dullness to percussion. Abdomin: Soft, nontender without any shifting dullness or ascites. Musculoskeletal: No clubbing or cyanosis. Neurological: No motor or sensory deficits. Skin: No rashes or lesions.                    Lab  Results: Lab Results  Component Value Date   WBC 6.4 08/03/2019   HGB 15.1 08/03/2019   HCT 47.2 08/03/2019   MCV 82.2 08/03/2019   PLT 223 08/03/2019     Chemistry      Component Value Date/Time   NA 139 08/03/2019 1244   NA 141 01/18/2015 0835   K 4.2 08/03/2019 1244   K 4.1 01/18/2015 0835   CL 105 08/03/2019 1244   CO2 26 08/03/2019 1244   CO2 25 01/18/2015 0835   BUN 27 (H) 08/03/2019 1244   BUN 21.5 01/18/2015 0835   CREATININE 1.65 (H) 08/03/2019 1244   CREATININE 1.1 01/18/2015 0835      Component Value Date/Time   CALCIUM 8.8 (L) 08/03/2019 1244   CALCIUM 9.8 01/18/2015 0835   ALKPHOS 55 08/03/2019 1244   ALKPHOS 66 01/18/2015 0835   AST 16 08/03/2019 1244   AST 15 01/18/2015 0835   ALT 12 08/03/2019 1244   ALT 16 01/18/2015 0835   BILITOT 0.5 08/03/2019 1244   BILITOT 0.91 01/18/2015 0835         Impression and Plan:  74 year old man with:    1.  Stage IV clear-cell renal cell carcinoma diagnosed in 2020.  He has documented hepatic and spermatic cord involvement.   He is status post therapy outlined above and currently on pembrolizumab alone.  Risks and benefits of continuing this therapy were reviewed at this time he is requesting a treatment break.  Complication associated with this treatment including pruritus and fatigue has troubled him and would like to at least withhold treatment for the time being.  After discussion today, we have decided to proceed with treatment today and give her a treatment break and repeat imaging studies in August 2021.  2.  Hypertension: Improved at this time with axitinib discontinuation.   3.  Secondary polycythemia: Related to malignancy and potentially axitinib.  He will not require phlebotomy today.  4.  IV access: Port-A-Cath currently in place and in use for the time being.  He is desiring to remove it in the future and will discuss after his next scan.  5.  Prognosis and goals of care: His disease is  incurable although aggressive measures are warranted given his performance status.  6.  Immune mediated complications: Long-term complication occluding pneumonitis, colitis and thyroid disease were reiterated.  He is not experiencing any other than pruritus.  7. Followup: In 2 months for repeat follow-up and imaging studies.   30 minutes were dedicated to this visit.  The time was spent reviewing his disease status, discussing treatment options and addressing complications of therapy.  Zola Button, MD 6/9/202112:01 PM

## 2019-08-23 NOTE — Progress Notes (Signed)
Per Dr. Alen Blew, ok to treat with Scr 1.62.

## 2019-08-23 NOTE — Patient Instructions (Signed)

## 2019-08-24 ENCOUNTER — Telehealth: Payer: Self-pay | Admitting: Oncology

## 2019-08-24 NOTE — Telephone Encounter (Signed)
Scheduled appt per 6/9 los.  Printed and mailed appt calendar.

## 2019-10-02 DIAGNOSIS — E785 Hyperlipidemia, unspecified: Secondary | ICD-10-CM | POA: Diagnosis not present

## 2019-10-02 DIAGNOSIS — C641 Malignant neoplasm of right kidney, except renal pelvis: Secondary | ICD-10-CM | POA: Diagnosis not present

## 2019-10-02 DIAGNOSIS — I10 Essential (primary) hypertension: Secondary | ICD-10-CM | POA: Diagnosis not present

## 2019-10-02 DIAGNOSIS — D45 Polycythemia vera: Secondary | ICD-10-CM | POA: Diagnosis not present

## 2019-10-02 DIAGNOSIS — C61 Malignant neoplasm of prostate: Secondary | ICD-10-CM | POA: Diagnosis not present

## 2019-10-09 DIAGNOSIS — M7731 Calcaneal spur, right foot: Secondary | ICD-10-CM | POA: Diagnosis not present

## 2019-10-09 DIAGNOSIS — C641 Malignant neoplasm of right kidney, except renal pelvis: Secondary | ICD-10-CM | POA: Diagnosis not present

## 2019-10-09 DIAGNOSIS — Z8546 Personal history of malignant neoplasm of prostate: Secondary | ICD-10-CM | POA: Diagnosis not present

## 2019-10-09 DIAGNOSIS — I10 Essential (primary) hypertension: Secondary | ICD-10-CM | POA: Diagnosis not present

## 2019-10-09 DIAGNOSIS — R7989 Other specified abnormal findings of blood chemistry: Secondary | ICD-10-CM | POA: Diagnosis not present

## 2019-10-09 DIAGNOSIS — F418 Other specified anxiety disorders: Secondary | ICD-10-CM | POA: Diagnosis not present

## 2019-10-09 DIAGNOSIS — J309 Allergic rhinitis, unspecified: Secondary | ICD-10-CM | POA: Diagnosis not present

## 2019-10-09 DIAGNOSIS — E785 Hyperlipidemia, unspecified: Secondary | ICD-10-CM | POA: Diagnosis not present

## 2019-10-09 DIAGNOSIS — Z0000 Encounter for general adult medical examination without abnormal findings: Secondary | ICD-10-CM | POA: Diagnosis not present

## 2019-10-09 DIAGNOSIS — D45 Polycythemia vera: Secondary | ICD-10-CM | POA: Diagnosis not present

## 2019-10-09 DIAGNOSIS — K219 Gastro-esophageal reflux disease without esophagitis: Secondary | ICD-10-CM | POA: Diagnosis not present

## 2019-10-09 DIAGNOSIS — M79673 Pain in unspecified foot: Secondary | ICD-10-CM | POA: Diagnosis not present

## 2019-10-09 DIAGNOSIS — R7309 Other abnormal glucose: Secondary | ICD-10-CM | POA: Diagnosis not present

## 2019-10-10 DIAGNOSIS — M722 Plantar fascial fibromatosis: Secondary | ICD-10-CM | POA: Diagnosis not present

## 2019-10-16 ENCOUNTER — Inpatient Hospital Stay: Payer: Medicare Other

## 2019-10-16 ENCOUNTER — Inpatient Hospital Stay: Payer: Medicare Other | Attending: Oncology

## 2019-10-16 ENCOUNTER — Other Ambulatory Visit: Payer: Self-pay

## 2019-10-16 DIAGNOSIS — Z452 Encounter for adjustment and management of vascular access device: Secondary | ICD-10-CM | POA: Diagnosis not present

## 2019-10-16 DIAGNOSIS — E039 Hypothyroidism, unspecified: Secondary | ICD-10-CM

## 2019-10-16 DIAGNOSIS — C7982 Secondary malignant neoplasm of genital organs: Secondary | ICD-10-CM | POA: Diagnosis not present

## 2019-10-16 DIAGNOSIS — J439 Emphysema, unspecified: Secondary | ICD-10-CM | POA: Diagnosis not present

## 2019-10-16 DIAGNOSIS — Z791 Long term (current) use of non-steroidal anti-inflammatories (NSAID): Secondary | ICD-10-CM | POA: Diagnosis not present

## 2019-10-16 DIAGNOSIS — C649 Malignant neoplasm of unspecified kidney, except renal pelvis: Secondary | ICD-10-CM | POA: Diagnosis not present

## 2019-10-16 DIAGNOSIS — Z9079 Acquired absence of other genital organ(s): Secondary | ICD-10-CM | POA: Insufficient documentation

## 2019-10-16 DIAGNOSIS — Z5112 Encounter for antineoplastic immunotherapy: Secondary | ICD-10-CM | POA: Diagnosis present

## 2019-10-16 DIAGNOSIS — D751 Secondary polycythemia: Secondary | ICD-10-CM | POA: Diagnosis not present

## 2019-10-16 DIAGNOSIS — Z905 Acquired absence of kidney: Secondary | ICD-10-CM | POA: Insufficient documentation

## 2019-10-16 DIAGNOSIS — I10 Essential (primary) hypertension: Secondary | ICD-10-CM | POA: Insufficient documentation

## 2019-10-16 DIAGNOSIS — Z95828 Presence of other vascular implants and grafts: Secondary | ICD-10-CM

## 2019-10-16 DIAGNOSIS — Z79899 Other long term (current) drug therapy: Secondary | ICD-10-CM | POA: Insufficient documentation

## 2019-10-16 LAB — CMP (CANCER CENTER ONLY)
ALT: 8 U/L (ref 0–44)
AST: 15 U/L (ref 15–41)
Albumin: 4.1 g/dL (ref 3.5–5.0)
Alkaline Phosphatase: 58 U/L (ref 38–126)
Anion gap: 9 (ref 5–15)
BUN: 23 mg/dL (ref 8–23)
CO2: 24 mmol/L (ref 22–32)
Calcium: 10.2 mg/dL (ref 8.9–10.3)
Chloride: 105 mmol/L (ref 98–111)
Creatinine: 1.42 mg/dL — ABNORMAL HIGH (ref 0.61–1.24)
GFR, Est AFR Am: 56 mL/min — ABNORMAL LOW (ref >60)
GFR, Estimated: 49 mL/min — ABNORMAL LOW (ref >60)
Glucose, Bld: 105 mg/dL — ABNORMAL HIGH (ref 70–99)
Potassium: 3.8 mmol/L (ref 3.5–5.1)
Sodium: 138 mmol/L (ref 135–145)
Total Bilirubin: 0.6 mg/dL (ref 0.3–1.2)
Total Protein: 7.6 g/dL (ref 6.5–8.1)

## 2019-10-16 LAB — CBC WITH DIFFERENTIAL (CANCER CENTER ONLY)
Abs Immature Granulocytes: 0.02 10*3/uL (ref 0.00–0.07)
Basophils Absolute: 0 10*3/uL (ref 0.0–0.1)
Basophils Relative: 1 %
Eosinophils Absolute: 0.5 10*3/uL (ref 0.0–0.5)
Eosinophils Relative: 8 %
HCT: 51.2 % (ref 39.0–52.0)
Hemoglobin: 17.1 g/dL — ABNORMAL HIGH (ref 13.0–17.0)
Immature Granulocytes: 0 %
Lymphocytes Relative: 20 %
Lymphs Abs: 1.2 10*3/uL (ref 0.7–4.0)
MCH: 27.4 pg (ref 26.0–34.0)
MCHC: 33.4 g/dL (ref 30.0–36.0)
MCV: 82.1 fL (ref 80.0–100.0)
Monocytes Absolute: 0.7 10*3/uL (ref 0.1–1.0)
Monocytes Relative: 11 %
Neutro Abs: 3.6 10*3/uL (ref 1.7–7.7)
Neutrophils Relative %: 60 %
Platelet Count: 169 10*3/uL (ref 150–400)
RBC: 6.24 MIL/uL — ABNORMAL HIGH (ref 4.22–5.81)
RDW: 16.3 % — ABNORMAL HIGH (ref 11.5–15.5)
WBC Count: 6 10*3/uL (ref 4.0–10.5)
nRBC: 0 % (ref 0.0–0.2)

## 2019-10-16 LAB — TSH: TSH: 1.741 u[IU]/mL (ref 0.320–4.118)

## 2019-10-16 MED ORDER — SODIUM CHLORIDE 0.9% FLUSH
10.0000 mL | INTRAVENOUS | Status: DC | PRN
  Administered 2019-10-16: 10 mL via INTRAVENOUS
  Filled 2019-10-16: qty 10

## 2019-10-16 MED ORDER — HEPARIN SOD (PORK) LOCK FLUSH 100 UNIT/ML IV SOLN
500.0000 [IU] | Freq: Once | INTRAVENOUS | Status: AC
  Administered 2019-10-16: 500 [IU] via INTRAVENOUS
  Filled 2019-10-16: qty 5

## 2019-10-20 ENCOUNTER — Other Ambulatory Visit: Payer: Medicare Other

## 2019-10-20 ENCOUNTER — Ambulatory Visit (HOSPITAL_COMMUNITY)
Admission: RE | Admit: 2019-10-20 | Discharge: 2019-10-20 | Disposition: A | Discharge status: Home / Self Care | Payer: Medicare Other | Source: Ambulatory Visit | Attending: Oncology | Admitting: Oncology

## 2019-10-20 ENCOUNTER — Other Ambulatory Visit: Payer: Self-pay

## 2019-10-20 DIAGNOSIS — R918 Other nonspecific abnormal finding of lung field: Secondary | ICD-10-CM | POA: Insufficient documentation

## 2019-10-20 DIAGNOSIS — J439 Emphysema, unspecified: Secondary | ICD-10-CM | POA: Diagnosis not present

## 2019-10-20 DIAGNOSIS — C649 Malignant neoplasm of unspecified kidney, except renal pelvis: Secondary | ICD-10-CM

## 2019-10-20 DIAGNOSIS — N2889 Other specified disorders of kidney and ureter: Secondary | ICD-10-CM

## 2019-10-20 DIAGNOSIS — K5900 Constipation, unspecified: Secondary | ICD-10-CM | POA: Diagnosis not present

## 2019-10-20 DIAGNOSIS — I700 Atherosclerosis of aorta: Secondary | ICD-10-CM | POA: Diagnosis not present

## 2019-10-20 MED ORDER — SODIUM CHLORIDE (PF) 0.9 % IJ SOLN
INTRAMUSCULAR | Status: AC
  Filled 2019-10-20: qty 50

## 2019-10-20 MED ORDER — IOHEXOL 300 MG/ML  SOLN
100.0000 mL | Freq: Once | INTRAMUSCULAR | Status: AC | PRN
  Administered 2019-10-20: 100 mL via INTRAVENOUS

## 2019-10-26 ENCOUNTER — Inpatient Hospital Stay (HOSPITAL_BASED_OUTPATIENT_CLINIC_OR_DEPARTMENT_OTHER): Payer: Medicare Other | Admitting: Oncology

## 2019-10-26 ENCOUNTER — Other Ambulatory Visit: Payer: Self-pay

## 2019-10-26 ENCOUNTER — Encounter: Payer: Self-pay | Admitting: Oncology

## 2019-10-26 VITALS — BP 132/85 | HR 75 | Temp 97.8°F | Resp 18 | Ht 69.0 in | Wt 188.8 lb

## 2019-10-26 DIAGNOSIS — N2889 Other specified disorders of kidney and ureter: Secondary | ICD-10-CM

## 2019-10-26 DIAGNOSIS — Z452 Encounter for adjustment and management of vascular access device: Secondary | ICD-10-CM | POA: Diagnosis not present

## 2019-10-26 DIAGNOSIS — I10 Essential (primary) hypertension: Secondary | ICD-10-CM | POA: Diagnosis not present

## 2019-10-26 DIAGNOSIS — Z95828 Presence of other vascular implants and grafts: Secondary | ICD-10-CM

## 2019-10-26 DIAGNOSIS — R918 Other nonspecific abnormal finding of lung field: Secondary | ICD-10-CM | POA: Diagnosis not present

## 2019-10-26 DIAGNOSIS — C649 Malignant neoplasm of unspecified kidney, except renal pelvis: Secondary | ICD-10-CM

## 2019-10-26 DIAGNOSIS — J439 Emphysema, unspecified: Secondary | ICD-10-CM | POA: Diagnosis not present

## 2019-10-26 DIAGNOSIS — D751 Secondary polycythemia: Secondary | ICD-10-CM | POA: Diagnosis not present

## 2019-10-26 DIAGNOSIS — C7982 Secondary malignant neoplasm of genital organs: Secondary | ICD-10-CM | POA: Diagnosis not present

## 2019-10-26 NOTE — Progress Notes (Signed)
Hematology and Oncology Follow Up Visit  [name] [id number] 11/14/1945 74 y.o. 10/26/2019 12:23 PM [name], DO   Principle Diagnosis: 74 year old man with clear-cell renal cell carcinoma diagnosed in April 2020.  He was found to have stage IV disease with spermatic cord involvement and questionable hepatic metastasis.   Secondary diagnosis: Polycythemia related to malignancy diagnosed in 2020.    Prior Therapy:   Therapeutic phlebotomy as needed.  He status post radical nephrectomy April 2020.  The final pathology showed clear cell histology invading into the perineural fat indicating stage T3a.  Tumor margins are negative.  He status post orchiectomy completed on 10/29/2018.  He final pathology showed metastatic carcinoma consistent with clear cell histology.   Axitinib 5 mg daily with pembrolizumab 200 mg every 3 weeks started on 01/26/2019.   Therapy discontinued in May 2021 due to patient preference and complete response to therapy.  He completed 11 cycles of therapy.     Current therapy:   Active surveillance and intermittent phlebotomy.  Interim History:  [name] presents today for a follow-up visit.  Since the last visit, he has reported overall improvement in his health although has not completely normalized at this time.  He had denied any nausea, vomiting or excessive fatigue.  He does report pruritus which has improved at this time.  He continues to attempt activities of daily living without any decline.  His blood pressure has been under better control.    .               Medications: Unchanged on review. Current Outpatient Medications  Medication Sig Dispense Refill  . busPIRone (BUSPAR) 5 MG tablet Take 5 mg by mouth 2 (two) times daily as needed (for anxiety).    . hydrOXYzine (ATARAX/VISTARIL) 10 MG tablet Take 1 tablet (10 mg total) by mouth 3 (three) times daily as needed. 30 tablet 3  . INLYTA 5 MG tablet TAKE 1  TABLET BY MOUTH TWICE DAILY. TAKE 12 HOURS APART WITH WATER, WITH OR WITHOUT FOOD 60 tablet 0  . lidocaine-prilocaine (EMLA) cream Apply 1 application topically as needed. 30 g 0  . LORazepam (ATIVAN) 1 MG tablet Take 1 tablet (1 mg total) by mouth every 8 (eight) hours. 30 tablet 0  . losartan-hydrochlorothiazide (HYZAAR) 100-25 MG tablet Take 1 tablet by mouth daily.    . meloxicam (MOBIC) 15 MG tablet Take 15 mg by mouth as needed for pain.    Marland Kitchen oxyCODONE-acetaminophen (PERCOCET) 5-325 MG tablet Take 1-2 tablets by mouth every 8 (eight) hours as needed for moderate pain or severe pain. Post-operatively 20 tablet 0  . pravastatin (PRAVACHOL) 40 MG tablet Take 40 mg by mouth daily.    . prochlorperazine (COMPAZINE) 10 MG tablet Take 1 tablet (10 mg total) by mouth every 6 (six) hours as needed for nausea or vomiting. 30 tablet 0  . fluticasone (FLONASE) 50 MCG/ACT nasal spray Place 1 spray into both nostrils daily as needed for allergies.  (Patient not taking: Reported on 10/26/2019)     No current facility-administered medications for this visit.     Allergies:  Allergies  Allergen Reactions  . Oxycodone Other (See Comments)    Patient stated,"it keeps me wide awake and I can't sleep."  . Septra [Sulfamethoxazole-Trimethoprim] Hives and Itching      Physical exam:         Blood pressure 132/85, pulse 75, temperature 97.8 F (36.6 C), temperature source Temporal, resp. rate 18, height  5\' 9"  (1.753 m), weight 188 lb 12.8 oz (85.6 kg), SpO2 98 %.      ECOG: 0    General appearance: Comfortable appearing without any discomfort Head: Normocephalic without any trauma Oropharynx: Mucous membranes are moist and pink without any thrush or ulcers. Eyes: Pupils are equal and round reactive to light. Lymph nodes: No cervical, supraclavicular, inguinal or axillary lymphadenopathy.   Heart:regular rate and rhythm.  S1 and S2 without leg edema. Lung: Clear without any rhonchi or  wheezes.  No dullness to percussion. Abdomin: Soft, nontender, nondistended with good bowel sounds.  No hepatosplenomegaly. Musculoskeletal: No joint deformity or effusion.  Full range of motion noted. Neurological: No deficits noted on motor, sensory and deep tendon reflex exam. Skin: No petechial rash or dryness.  Appeared moist.                      Lab Results: Lab Results  Component Value Date   WBC 6.0 10/16/2019   HGB 17.1 (H) 10/16/2019   HCT 51.2 10/16/2019   MCV 82.1 10/16/2019   PLT 169 10/16/2019     Chemistry      Component Value Date/Time   NA 138 10/16/2019 0819   NA 141 01/18/2015 0835   K 3.8 10/16/2019 0819   K 4.1 01/18/2015 0835   CL 105 10/16/2019 0819   CO2 24 10/16/2019 0819   CO2 25 01/18/2015 0835   BUN 23 10/16/2019 0819   BUN 21.5 01/18/2015 0835   CREATININE 1.42 (H) 10/16/2019 0819   CREATININE 1.1 01/18/2015 0835      Component Value Date/Time   CALCIUM 10.2 10/16/2019 0819   CALCIUM 9.8 01/18/2015 0835   ALKPHOS 58 10/16/2019 0819   ALKPHOS 66 01/18/2015 0835   AST 15 10/16/2019 0819   AST 15 01/18/2015 0835   ALT 8 10/16/2019 0819   ALT 16 01/18/2015 0835   BILITOT 0.6 10/16/2019 0819   BILITOT 0.91 01/18/2015 0835     IMPRESSION: 1. Status post right nephrectomy. No findings for recurrent tumor, locoregional adenopathy or metastatic disease. 2. Stable small right-sided retroperitoneal nodule. 3. Stable emphysematous changes. No acute pulmonary findings or worrisome pulmonary lesions. 4. Moderate to large amount of stool throughout the colon could suggest constipation. 5. Brachytherapy seeds in the prostate gland but no findings suspicious for metastatic disease. 6. Emphysema and aortic atherosclerosis.     Impression and Plan:  74 year old man with:    1.  Renal cell carcinoma diagnosed in 2020.  He developed stage IV clear-cell histology with spermatic cord involvement and questionable hepatic  metastasis.   He completed 11 cycles of therapy with Pembrolizumab and axitinib with a few complications that necessitate axitinib dose reduction and subsequently treatment discontinuation.  CT scan obtained on October 20, 2019 was personally reviewed and showed a complete response to therapy without any residual tumor noted at this time.  The natural course of this disease and treatment options were reviewed.  Restarting maintenance therapy versus continued active surveillance were discussed.  He is adamant against any therapy at this time unless needed to.  We decided to continued active surveillance and repeat studies in January 2022.   2.  Hypertension: His blood pressure is back to normal range at this time.   3.  Secondary polycythemia: He has been receiving intermittent phlebotomy and will reevaluate in the future whether this needs to be resumed.  4.  IV access: Port-A-Cath will be removed in the near future based  on his request.  5.  Prognosis and goals of care: He had an excellent response to therapy and aggressive measures are warranted although his disease is likely not cured.  6.  Immune mediated complications: He is experiencing colitis but no other long-term complications.  7. Followup: In 5 months for repeat evaluation and imaging studies.   30 minutes were spent on this encounter.  The time was dedicated to reviewing imaging studies, disease status update and options of treatment for the future.  Zola Button, MD 8/12/202112:23 PM

## 2019-11-01 DIAGNOSIS — Z23 Encounter for immunization: Secondary | ICD-10-CM | POA: Diagnosis not present

## 2019-11-17 ENCOUNTER — Other Ambulatory Visit: Payer: Self-pay | Admitting: Radiology

## 2019-11-21 ENCOUNTER — Ambulatory Visit (HOSPITAL_COMMUNITY)
Admission: RE | Admit: 2019-11-21 | Discharge: 2019-11-21 | Disposition: A | Discharge status: Home / Self Care | Payer: Medicare Other | Source: Ambulatory Visit | Attending: Oncology | Admitting: Oncology

## 2019-11-21 ENCOUNTER — Other Ambulatory Visit: Payer: Self-pay

## 2019-11-21 DIAGNOSIS — Z8546 Personal history of malignant neoplasm of prostate: Secondary | ICD-10-CM | POA: Insufficient documentation

## 2019-11-21 DIAGNOSIS — Z452 Encounter for adjustment and management of vascular access device: Secondary | ICD-10-CM | POA: Diagnosis not present

## 2019-11-21 DIAGNOSIS — Z9221 Personal history of antineoplastic chemotherapy: Secondary | ICD-10-CM | POA: Diagnosis not present

## 2019-11-21 DIAGNOSIS — Z95828 Presence of other vascular implants and grafts: Secondary | ICD-10-CM

## 2019-11-21 DIAGNOSIS — C649 Malignant neoplasm of unspecified kidney, except renal pelvis: Secondary | ICD-10-CM | POA: Diagnosis not present

## 2019-11-21 HISTORY — PX: IR REMOVAL TUN ACCESS W/ PORT W/O FL MOD SED: IMG2290

## 2019-11-21 LAB — CBC WITH DIFFERENTIAL/PLATELET
Abs Immature Granulocytes: 0.02 10*3/uL (ref 0.00–0.07)
Basophils Absolute: 0 10*3/uL (ref 0.0–0.1)
Basophils Relative: 1 %
Eosinophils Absolute: 0.7 10*3/uL — ABNORMAL HIGH (ref 0.0–0.5)
Eosinophils Relative: 13 %
HCT: 51 % (ref 39.0–52.0)
Hemoglobin: 17.1 g/dL — ABNORMAL HIGH (ref 13.0–17.0)
Immature Granulocytes: 0 %
Lymphocytes Relative: 19 %
Lymphs Abs: 1.1 10*3/uL (ref 0.7–4.0)
MCH: 28.4 pg (ref 26.0–34.0)
MCHC: 33.5 g/dL (ref 30.0–36.0)
MCV: 84.7 fL (ref 80.0–100.0)
Monocytes Absolute: 0.7 10*3/uL (ref 0.1–1.0)
Monocytes Relative: 12 %
Neutro Abs: 3.2 10*3/uL (ref 1.7–7.7)
Neutrophils Relative %: 55 %
Platelets: 188 10*3/uL (ref 150–400)
RBC: 6.02 MIL/uL — ABNORMAL HIGH (ref 4.22–5.81)
RDW: 14.8 % (ref 11.5–15.5)
WBC: 5.7 10*3/uL (ref 4.0–10.5)
nRBC: 0 % (ref 0.0–0.2)

## 2019-11-21 LAB — BASIC METABOLIC PANEL
Anion gap: 11 (ref 5–15)
BUN: 21 mg/dL (ref 8–23)
CO2: 23 mmol/L (ref 22–32)
Calcium: 9.4 mg/dL (ref 8.9–10.3)
Chloride: 105 mmol/L (ref 98–111)
Creatinine, Ser: 1.56 mg/dL — ABNORMAL HIGH (ref 0.61–1.24)
GFR calc Af Amer: 50 mL/min — ABNORMAL LOW (ref >60)
GFR calc non Af Amer: 43 mL/min — ABNORMAL LOW (ref >60)
Glucose, Bld: 118 mg/dL — ABNORMAL HIGH (ref 70–99)
Potassium: 5.2 mmol/L — ABNORMAL HIGH (ref 3.5–5.1)
Sodium: 139 mmol/L (ref 135–145)

## 2019-11-21 LAB — PROTIME-INR
INR: 1 (ref 0.8–1.2)
Prothrombin Time: 12.6 seconds (ref 11.4–15.2)

## 2019-11-21 MED ORDER — LIDOCAINE-EPINEPHRINE 1 %-1:100000 IJ SOLN
INTRAMUSCULAR | Status: AC
  Filled 2019-11-21: qty 1

## 2019-11-21 MED ORDER — CEFAZOLIN SODIUM-DEXTROSE 2-4 GM/100ML-% IV SOLN
INTRAVENOUS | Status: AC
  Filled 2019-11-21: qty 100

## 2019-11-21 MED ORDER — CEFAZOLIN SODIUM-DEXTROSE 2-4 GM/100ML-% IV SOLN
2.0000 g | INTRAVENOUS | Status: AC
  Administered 2019-11-21: 2 g via INTRAVENOUS

## 2019-11-21 MED ORDER — SODIUM CHLORIDE 0.9 % IV SOLN: INTRAVENOUS | Status: DC

## 2019-11-21 NOTE — Procedures (Signed)
Pre Procedural Dx: Poor venous access Post Procedural Dx: Same  Successful removal of anterior chest wall port-a-cath.  EBL: Minimal  No immediate post procedural complications.   Jay Tyjon Bowen, MD Pager #: [phone]   

## 2019-11-21 NOTE — Discharge Instructions (Signed)
Urgent needs - IR on call MD [phone]  Wound - May remove dressing and shower in 24 to 48 hours.  Keep site clean and dry.  Replace with bandaid. Do not submerge in tub or water until site healing well.  Ice pack for discomfort  Implanted Port Removal, Care After This sheet gives you information about how to care for yourself after your procedure. Your health care provider may also give you more specific instructions. If you have problems or questions, contact your health care provider. What can I expect after the procedure? After the procedure, it is common to have:  Soreness or pain near your incision.  Some swelling or bruising near your incision. Follow these instructions at home: Medicines  Take over-the-counter and prescription medicines only as told by your health care provider.  If you were prescribed an antibiotic medicine, take it as told by your health care provider. Do not stop taking the antibiotic even if you start to feel better. Bathing  Do not take baths, swim, or use a hot tub until your health care provider approves. Ask your health care provider if you can take showers. You may only be allowed to take sponge baths. Incision care  Follow instructions from your health care provider about how to take care of your incision. Make sure you: ? Wash your hands with soap and water before you change your bandage (dressing). If soap and water are not available, use hand sanitizer. ? Change your dressing as told by your health care provider. ? Keep your dressing dry. ? Leave stitches (sutures), skin glue, or adhesive strips in place. These skin closures may need to stay in place for 2 weeks or longer. If adhesive strip edges start to loosen and curl up, you may trim the loose edges. Do not remove adhesive strips completely unless your health care provider tells you to do that.  Check your incision area every day for signs of infection. Check for: ? More redness, swelling,  or pain. ? More fluid or blood. ? Warmth. ? Pus or a bad smell. Driving  Do not drive for 24 hours if you were given a medicine to help you relax (sedative) during your procedure.  If you did not receive a sedative, ask your health care provider when it is safe to drive. Activity  Return to your normal activities as told by your health care provider. Ask your health care provider what activities are safe for you.  Do not lift anything that is heavier than 10 lb (4.5 kg), or the limit that you are told, until your health care provider says that it is safe.  Do not do activities that involve lifting your arms over your head. General instructions  Do not use any products that contain nicotine or tobacco, such as cigarettes and e-cigarettes. These can delay healing. If you need help quitting, ask your health care provider.  Keep all follow-up visits as told by your health care provider. This is important. Contact a health care provider if:  You have more redness, swelling, or pain around your incision.  You have more fluid or blood coming from your incision.  Your incision feels warm to the touch.  You have pus or a bad smell coming from your incision.  You have pain that is not relieved by your pain medicine. Get help right away if you have:  A fever or chills.  Chest pain.  Difficulty breathing. Summary  After the procedure, it is common to  have pain, soreness, swelling, or bruising near your incision.  If you were prescribed an antibiotic medicine, take it as told by your health care provider. Do not stop taking the antibiotic even if you start to feel better.  Do not drive for 24 hours if you were given a sedative during your procedure.  Return to your normal activities as told by your health care provider. Ask your health care provider what activities are safe for you. This information is not intended to replace advice given to you by your health care provider. Make  sure you discuss any questions you have with your health care provider. Document Revised: 04/15/2017 Document Reviewed: 04/15/2017 Elsevier Patient Education  2020 [employer].

## 2019-11-21 NOTE — Progress Notes (Signed)
Patient ID: [name], male   DOB: 04-22-1945, 74 y.o.   MRN: [id number] Patient presents to IR department today  for Port-A-Cath removal.  He has a history of prostate cancer as well as metastatic renal cell carcinoma.  He has completed chemotherapy.  Details/risks of procedure, including but not limited to, internal bleeding, infection, injury to adjacent structures discussed with patient with his understanding and consent.  He does not wish to receive IV conscious sedation for the procedure.

## 2019-12-08 ENCOUNTER — Telehealth: Payer: Self-pay | Admitting: *Deleted

## 2019-12-08 NOTE — Telephone Encounter (Signed)
Patient called - states itching was getting better, but now is worse. Has most trouble with it when he goes out in sun, sweats or becomes excited. States Hydroxyzine knocks him out Allegra upset his stomach.  He wants to ask Dr Alen Blew if this is something that will get better soon, or with time or will he just have to live with it?  Informed patient that Dr. Alen Blew out of office today - patient states ok to give him a message and he looks forward to hearing from him when he returns. Call information routed to Dr. Alen Blew and desk nurse pod.

## 2019-12-12 ENCOUNTER — Telehealth: Payer: Self-pay

## 2019-12-12 ENCOUNTER — Other Ambulatory Visit: Payer: Self-pay

## 2019-12-12 MED ORDER — METHYLPREDNISOLONE 4 MG PO TBPK: ORAL_TABLET | ORAL | 0 refills | Status: DC

## 2019-12-12 NOTE — Telephone Encounter (Signed)
-----   Message from [name], MD sent at 12/12/2019  2:55 PM EDT ----- Please send an Rx for medrol does pack for 6 days: 4 mg tabs, use 6 on day 1, 5 on day 2, 4 on day 3 etc... he should complete the course in 6 days. #21 no refills. Thanks ----- Message ----- From: Tami Lin, RN Sent: 12/12/2019   2:38 PM EDT To: [name], MD  Patient called and stated he continues to have severe itching that is the worst it has ever been. See note below entered by [facility] last week.   Patient called - states itching was getting better, but now is worse. Has most trouble with it when he goes out in sun, sweats or becomes excited. States Hydroxyzine knocks him out Allegra upset his stomach.  He wants to ask Dr Alen Blew if this is something that will get better soon, or with time or will he just have to live with it?  Informed patient that Dr. Alen Blew out of office today - patient states ok to give him a message and he looks forward to hearing from him when he returns. Call information routed to Dr. Alen Blew and desk nurse pod.

## 2019-12-12 NOTE — Telephone Encounter (Signed)
Prescription for medrol dose pack sent to patient's pharmacy. Patient made aware and verbalized understanding.

## 2019-12-18 DIAGNOSIS — H35432 Paving stone degeneration of retina, left eye: Secondary | ICD-10-CM | POA: Diagnosis not present

## 2019-12-18 DIAGNOSIS — H16223 Keratoconjunctivitis sicca, not specified as Sjogren's, bilateral: Secondary | ICD-10-CM | POA: Diagnosis not present

## 2019-12-18 DIAGNOSIS — H25813 Combined forms of age-related cataract, bilateral: Secondary | ICD-10-CM | POA: Diagnosis not present

## 2019-12-18 DIAGNOSIS — H353131 Nonexudative age-related macular degeneration, bilateral, early dry stage: Secondary | ICD-10-CM | POA: Diagnosis not present

## 2019-12-26 ENCOUNTER — Other Ambulatory Visit: Payer: Self-pay

## 2019-12-26 ENCOUNTER — Telehealth: Payer: Self-pay

## 2019-12-26 ENCOUNTER — Other Ambulatory Visit: Payer: Self-pay | Admitting: Oncology

## 2019-12-26 DIAGNOSIS — C649 Malignant neoplasm of unspecified kidney, except renal pelvis: Secondary | ICD-10-CM

## 2019-12-26 DIAGNOSIS — L299 Pruritus, unspecified: Secondary | ICD-10-CM

## 2019-12-26 MED ORDER — METHYLPREDNISOLONE 4 MG PO TBPK: ORAL_TABLET | ORAL | 0 refills | Status: DC

## 2019-12-26 NOTE — Telephone Encounter (Signed)
Refilled medication per Dr Alen Blew. Advised patient that Dr Alen Blew made a referral for him to see an allergist. Patient verbalized understanding.

## 2019-12-26 NOTE — Telephone Encounter (Signed)
-----   Message from [name], MD sent at 12/26/2019  9:42 AM EDT ----- Please refill his steroid Rx. He needs to see allergy specialist.  I will make the referral for him. ----- Message ----- From: Kennedy Bucker, LPN Sent: [id number]   9:35 AM EDT To: [name], MD  Patient called in stated that he is having serve itching again after finish taking the steroid. It stopped while on the steroid but now it is back and he is having a hard time dealing with it. Please advise.   Kim LPN

## 2019-12-27 ENCOUNTER — Encounter (INDEPENDENT_AMBULATORY_CARE_PROVIDER_SITE_OTHER): Payer: Medicare Other | Admitting: Ophthalmology

## 2020-01-01 ENCOUNTER — Other Ambulatory Visit: Payer: Self-pay

## 2020-01-01 ENCOUNTER — Encounter (INDEPENDENT_AMBULATORY_CARE_PROVIDER_SITE_OTHER): Payer: Medicare Other | Admitting: Ophthalmology

## 2020-01-01 DIAGNOSIS — H3562 Retinal hemorrhage, left eye: Secondary | ICD-10-CM | POA: Diagnosis not present

## 2020-01-01 DIAGNOSIS — I10 Essential (primary) hypertension: Secondary | ICD-10-CM | POA: Diagnosis not present

## 2020-01-01 DIAGNOSIS — H43813 Vitreous degeneration, bilateral: Secondary | ICD-10-CM

## 2020-01-01 DIAGNOSIS — H35033 Hypertensive retinopathy, bilateral: Secondary | ICD-10-CM | POA: Diagnosis not present

## 2020-01-01 DIAGNOSIS — H353122 Nonexudative age-related macular degeneration, left eye, intermediate dry stage: Secondary | ICD-10-CM | POA: Diagnosis not present

## 2020-01-18 ENCOUNTER — Other Ambulatory Visit: Payer: Self-pay

## 2020-01-18 ENCOUNTER — Ambulatory Visit (INDEPENDENT_AMBULATORY_CARE_PROVIDER_SITE_OTHER): Payer: Medicare Other | Admitting: Allergy

## 2020-01-18 ENCOUNTER — Encounter: Payer: Self-pay | Admitting: Allergy

## 2020-01-18 VITALS — BP 128/82 | HR 75 | Temp 98.0°F | Resp 14 | Ht 67.25 in | Wt 195.0 lb

## 2020-01-18 DIAGNOSIS — L299 Pruritus, unspecified: Secondary | ICD-10-CM

## 2020-01-18 NOTE — Progress Notes (Signed)
New Patient Note  RE: [name] MRN: [id number] DOB: [birth year] Date of Office Visit: 01/18/2020  Referring provider: [name], MD Primary care provider: Janie Morning, DO  Chief Complaint: Allergic Reaction (Itching from chemo treatment, chemo stopped for now(June 9,2021) Tried steroids but didn't help. Keytruda )  History of Present Illness: I had the pleasure of seeing Mansel Strother for initial evaluation at the [facility] on 01/21/2020. He is a 74 y.o. male, who is referred here by Dr. Alen Blew (heme/onc) for the evaluation of pruritus.  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 He also has polycythemia.   He was started on Pembrolizumab and axitinib chemo and was receiving it every 3 weeks. Finished 11 cycles.  He noticed episodes of pruritus after a few months of starting this IV chemotherapy. On June 9th the chemotherapy was stopped and initially the itching improved but now it's getting worse.  Triggers include getting overheated, getting overworked or with sun exposure.  Describes the itching more as a sensation of his nerve endings getting irritated.   This sensation only lasts a few minutes at a time but has multiple episodes throughout the day depending if he is exposed to his triggers.  During the [place] patient got frustrated as he could not think of a medication name and felt the sensation over his chest but it resolved within minutes. No associated rash visible.   Patient states that he can comes to term with if this is something he needs to live with.  Associated symptoms include: none. Suspected triggers are chemotherapy. Denies any fevers, chills, changes in medications, foods, personal care products. He has tried the following therapies: oral medrol pak with some benefit. Tried hydroxyzine which knocked  him out and allegra upset his stomach. Previous work up includes: none. Previous history of rash/itching: none.  Rhinitis: He reports symptoms of nasal congestion and rhinorrhea. Symptoms have been going on for 4-5 years which are present perennially.  Assessment and Plan: Jabri is a 73 y.o. male with: Pruritus Patient diagnosed with clear-cell renal carcinoma and underwent 11 cycles of pembrolizumab and axitinib referred to me due to pruritus. Treated with medrol pak with good benefit but symptoms resolved after completion. Hydroxyzine caused too much drowsiness and Allegra upset his stomach. Describes the itching more of a sensation of his nerves getting irritated on his skin especially after he gets overheated, overworked or with sun exposure. This happens multiple times during the day but the symptoms only lasts for a few minutes at a time.  Denies changes in personal care products, diet. Some history of perennial rhinitis.   Discussed with patient at length that I'm not sure what's causing these sensations but they do not seem to be "allergic" in nature. Concern if there's some type of sensory nerve damage of his skin from the chemotherapy. Offered to start gabapentin which can help with this but patient reluctant due to its side effects. He also does not want to take any antihistamines due to intolerance to hydroxyzine and allegra.  Discussed proper skin care.  Get bloodwork to rule out other etiologies. Avoid the following potential triggers: alcohol, tight clothing, getting overheated.  Return if symptoms worsen or fail to improve.  Lab Orders     Tryptase     Allergens w/Total IgE Area 2     Alpha-Gal Panel  ANA w/Reflex  Other allergy screening: Asthma: no Food allergy: no Medication allergy: yes Hymenoptera allergy: no History of recurrent infections suggestive of immunodeficency: no  Diagnostics: None.  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   Renal mass 06/22/2018   Polycythemia, secondary 09/01/2013   Past Medical History:  Diagnosis Date   Anxiety    Arthritis    Diverticulitis    Hypertension    Inguinal hernia    Metastatic renal cell carcinoma ([place]) dx'd 2020   Polycythemia    phlebotomy 06/15/13   Prostate cancer ([place]) dx'd 2008   seed implant   Wears glasses    Wears hearing aid    both ears   Past Surgical History: Past Surgical History:  Procedure Laterality Date   COLONOSCOPY     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   Procedure: EXCISION LEFT DUPUYTRENS RING AND SMALL FINGERS;  Surgeon: Cammie Sickle., MD;  Location: [facility];  Service: Orthopedics;  Laterality: Left;   ESOPHAGOGASTRODUODENOSCOPY (EGD) WITH PROPOFOL N/A 05/12/2018   Procedure: ESOPHAGOGASTRODUODENOSCOPY (EGD) WITH PROPOFOL;  Surgeon: Laurence Spates, MD;  Location: [facility];  Service: Endoscopy;  Laterality: N/A;   HERNIA REPAIR  2010   lt ing with lt hydrocele   IR IMAGING GUIDED PORT INSERTION  01/20/2019   IR REMOVAL TUN ACCESS W/ PORT W/O FL MOD SED  11/21/2019   ORCHIECTOMY Right 10/29/2018   Procedure: ORCHIECTOMY radical;  Surgeon: Alexis Frock, MD;  Location: [facility];  Service: Urology;  Laterality: Right;  [address] IMPLANT  2006   hx of prostate   ROBOT ASSISTED LAPAROSCOPIC NEPHRECTOMY Right 06/22/2018   Procedure: XI ROBOTIC ASSISTED LAPAROSCOPIC NEPHRECTOMY;  Surgeon: Alexis Frock, MD;  Location: [facility];  Service: Urology;  Laterality: Right;  3 HRS   SHOULDER ARTHROSCOPY  11/13   right   Medication List:  Current Outpatient Medications  Medication Sig Dispense Refill   aspirin EC 81 MG tablet Take 81 mg by mouth daily. Swallow whole.      busPIRone (BUSPAR) 5 MG tablet Take 5 mg by mouth 2 (two) times daily as needed (for anxiety).     fluticasone (FLONASE) 50 MCG/ACT nasal spray Place 1 spray into both nostrils daily as needed for allergies.      losartan-hydrochlorothiazide (HYZAAR) 100-25 MG tablet Take 1 tablet by mouth daily.     pravastatin (PRAVACHOL) 40 MG tablet Take 40 mg by mouth daily.     INLYTA 5 MG tablet TAKE 1 TABLET BY MOUTH TWICE DAILY. TAKE 12 HOURS APART WITH WATER, WITH OR WITHOUT FOOD (Patient not taking: Reported on 01/18/2020) 60 tablet 0   lidocaine-prilocaine (EMLA) cream Apply 1 application topically as needed. (Patient not taking: Reported on 01/18/2020) 30 g 0   LORazepam (ATIVAN) 1 MG tablet Take 1 tablet (1 mg total) by mouth every 8 (eight) hours. (Patient not taking: Reported on 01/18/2020) 30 tablet 0   meloxicam (MOBIC) 15 MG tablet Take 15 mg by mouth as needed for pain. (Patient not taking: Reported on 01/18/2020)     prochlorperazine (COMPAZINE) 10 MG tablet Take 1 tablet (10 mg total) by mouth every 6 (six) hours as needed for nausea or vomiting. (Patient not taking: Reported on 01/18/2020) 30 tablet  0   No current facility-administered medications for this visit.   Allergies: Allergies  Allergen Reactions   Oxycodone Other (See Comments)    Patient stated,"it keeps me wide awake and I can't sleep."   Septra [Sulfamethoxazole-Trimethoprim] Hives and Itching   Social History: Social History   Socioeconomic History   Marital status: Married    Spouse name: Not on file   Number of children: Not on file   Years of education: Not on file   Highest education level: Not on file  Occupational History   Not on file  Tobacco Use   Smoking status: Former Smoker    Quit date: 06/15/1980    Years since quitting: 39.6   Smokeless tobacco: Never Used  Vaping Use   Vaping Use: Never used  Substance and Sexual Activity   Alcohol use: Never   Drug use: No   Sexual  activity: Not on file  Other Topics Concern   Not on file  Social History Narrative   Not on file   Social Determinants of Health   Financial Resource Strain:    Difficulty of Paying Living Expenses: Not on file  Food Insecurity:    Worried About Charity fundraiser in the Last Year: Not on file   [place] in the Last Year: Not on file  Transportation Needs:    Lack of Transportation (Medical): Not on file   Lack of Transportation (Non-Medical): Not on file  Physical Activity:    Days of Exercise per Week: Not on file   Minutes of Exercise per Session: Not on file  Stress:    Feeling of Stress : Not on file  Social Connections:    Frequency of Communication with Friends and Family: Not on file   Frequency of Social Gatherings with Friends and Family: Not on file   Attends Religious Services: Not on file   Active Member of Clubs or Organizations: Not on file   Attends Archivist Meetings: Not on file   Marital Status: Not on file   Lives in a 73 year old home. Smoking: smoked 40 years ago Occupation: retired  Programme researcher, broadcasting/film/video History: Environmental education officer in the house: no Charity fundraiser in the family room: no Carpet in the bedroom: no Heating: electric Cooling: central Pet: yes 1 dog x 7 years  Family History: Family History  Problem Relation Age of Onset   Heart attack Father    Hypertension Father    Problem                               Relation Asthma                                   No  Eczema                                No  Food allergy                          No  Allergic rhino conjunctivitis     No   Review of Systems  Constitutional: Negative for appetite change, chills, fever and unexpected weight change.  HENT: Positive for congestion and rhinorrhea.   Eyes: Negative for itching.  Respiratory: Negative for cough, chest  tightness, shortness of breath and wheezing.   Cardiovascular: Negative for chest pain.   Gastrointestinal: Negative for abdominal pain.  Genitourinary: Negative for difficulty urinating.  Skin: Negative for rash.  Neurological: Negative for headaches.   Objective: BP 128/82    Pulse 75    Temp 98 F (36.7 C)    Resp 14    Ht 5' 7.25" (1.708 m)    Wt 195 lb (88.5 kg)    SpO2 94%    BMI 30.31 kg/m  Body mass index is 30.31 kg/m. Physical Exam Vitals and nursing note reviewed.  Constitutional:      Appearance: Normal appearance. He is well-developed.  HENT:     Head: Normocephalic and atraumatic.     Right Ear: External ear normal.     Left Ear: External ear normal.     Nose: Nose normal.     Mouth/Throat:     Mouth: Mucous membranes are moist.     Pharynx: Oropharynx is clear.  Eyes:     Conjunctiva/sclera: Conjunctivae normal.  Cardiovascular:     Rate and Rhythm: Normal rate and regular rhythm.     Heart sounds: Normal heart sounds. No murmur heard.  No friction rub. No gallop.   Pulmonary:     Effort: Pulmonary effort is normal.     Breath sounds: Normal breath sounds. No wheezing, rhonchi or rales.  Abdominal:     Palpations: Abdomen is soft.  Musculoskeletal:     Cervical back: Neck supple.  Skin:    General: Skin is warm.     Findings: No rash.  Neurological:     Mental Status: He is alert and oriented to person, place, and time.  Psychiatric:        Behavior: Behavior normal.    The plan was reviewed with the patient/family, and all questions/concerned were addressed.  It was my pleasure to see Saafir today and participate in his care. Please feel free to contact me with any questions or concerns.  Sincerely,  Rexene Alberts, DO Allergy & Immunology  [facility] office: [place] office: [phone]

## 2020-01-18 NOTE — Patient Instructions (Addendum)
Not sure what's causing your itching. See below for proper skin care.  Get bloodwork to rule out other etiologies. We are ordering labs, so please allow 1-2 weeks for the results to come back. With the newly implemented Cures Act, the labs might be visible to you at the same time that they become visible to me. However, I will not address the results until all of the results are back, so please be patient.   Will send visit note to Dr. Alen Blew.  Option of trying Neurontin for the itching sensation. Avoid the following potential triggers: alcohol, tight clothing, getting overheated.  Follow up as needed.   Skin care recommendations  Bath time: . Always use lukewarm water. AVOID very hot or cold water. Marland Kitchen Keep bathing time to 5-10 minutes. . Do NOT use bubble bath. . Use a mild soap and use just enough to wash the dirty areas. . Do NOT scrub skin vigorously.  . After bathing, pat dry your skin with a towel. Do NOT rub or scrub the skin.  Moisturizers and prescriptions:  . ALWAYS apply moisturizers immediately after bathing (within 3 minutes). This helps to lock-in moisture. . Use the moisturizer several times a day over the whole body. Kermit Balo summer moisturizers include: Aveeno, CeraVe, Cetaphil. Kermit Balo winter moisturizers include: Aquaphor, Vaseline, Cerave, Cetaphil, Eucerin, Vanicream. . When using moisturizers along with medications, the moisturizer should be applied about one hour after applying the medication to prevent diluting effect of the medication or moisturize around where you applied the medications. When not using medications, the moisturizer can be continued twice daily as maintenance.  Laundry and clothing: . Avoid laundry products with added color or perfumes. . Use unscented hypo-allergenic laundry products such as Tide free, Cheer free & gentle, and All free and clear.  . If the skin still seems dry or sensitive, you can try double-rinsing the clothes. . Avoid tight  or scratchy clothing such as wool. . Do not use fabric softeners or dyer sheets.

## 2020-01-19 LAB — ALPHA-GAL PANEL

## 2020-01-21 ENCOUNTER — Encounter: Payer: Self-pay | Admitting: Allergy

## 2020-01-21 NOTE — Assessment & Plan Note (Signed)
Patient diagnosed with clear-cell renal carcinoma and underwent 11 cycles of pembrolizumab and axitinib referred to me due to pruritus. Treated with medrol pak with good benefit but symptoms resolved after completion. Hydroxyzine caused too much drowsiness and Allegra upset his stomach. Describes the itching more of a sensation of his nerves getting irritated on his skin especially after he gets overheated, overworked or with sun exposure. This happens multiple times during the day but the symptoms only lasts for a few minutes at a time.  Denies changes in personal care products, diet. Some history of perennial rhinitis.   Discussed with patient at length that I'm not sure what's causing these sensations but they do not seem to be "allergic" in nature. Concern if there's some type of sensory nerve damage of his skin from the chemotherapy. Offered to start gabapentin which can help with this but patient reluctant due to its side effects. He also does not want to take any antihistamines due to intolerance to hydroxyzine and allegra.  Discussed proper skin care.  Get bloodwork to rule out other etiologies. Avoid the following potential triggers: alcohol, tight clothing, getting overheated.

## 2020-01-24 LAB — ALLERGEN PROFILE WITH TOTAL IGE, RESPIRATORY-AREA 2
Alternaria Alternata IgE: <0.1 kU/L
Aspergillus Fumigatus IgE: <0.1 kU/L
Bermuda Grass IgE: 0.57 kU/L — AB
Cat Dander IgE: 0.18 kU/L — AB
Cedar, Mountain IgE: 1.16 kU/L — AB
Cladosporium Herbarum IgE: <0.1 kU/L
Cockroach, German IgE: 0.46 kU/L — AB
Common Silver Birch IgE: 0.21 kU/L — AB
Cottonwood IgE: 0.16 kU/L — AB
D Farinae IgE: 0.38 kU/L — AB
D Pteronyssinus IgE: 0.44 kU/L — AB
Dog Dander IgE: 0.46 kU/L — AB
Elm, American IgE: 0.53 kU/L — AB
IgE (Immunoglobulin E), Serum: 751 [IU]/mL — ABNORMAL HIGH (ref 6–495)
Johnson Grass IgE: 1.11 kU/L — AB
Maple/Box Elder IgE: 0.24 kU/L — AB
Mouse Urine IgE: <0.1 kU/L
Oak, White IgE: 0.59 kU/L — AB
Pecan, Hickory IgE: 0.21 kU/L — AB
Penicillium Chrysogen IgE: <0.1 kU/L
Pigweed, Rough IgE: 0.52 kU/L — AB
Ragweed, Short IgE: 0.87 kU/L — AB
Sheep Sorrel IgE Qn: 0.25 kU/L — AB
Timothy Grass IgE: 4.16 kU/L — AB
White Mulberry IgE: <0.1 kU/L

## 2020-01-24 LAB — ANA W/REFLEX: Anti Nuclear Antibody (ANA): NEGATIVE

## 2020-01-24 LAB — TRYPTASE: Tryptase: 8.4 ug/L (ref 2.2–13.2)

## 2020-01-24 LAB — ALPHA-GAL PANEL
Alpha Gal IgE*: 5.59 kU/L — ABNORMAL HIGH
Beef (Bos spp) IgE: 0.3 kU/L
Class Interpretation: 0
Lamb/Mutton (Ovis spp) IgE: <0.1 kU/L
Pork (Sus spp) IgE: 0.12 kU/L

## 2020-03-20 ENCOUNTER — Telehealth: Payer: Self-pay

## 2020-03-20 NOTE — Telephone Encounter (Signed)
Oral Oncology Patient Advocate Encounter  Patient has been approved for copay assistance with The Assistance Fund (TAF).  The Assistance Fund will cover all copayment expenses for Inlyta for the remainder of the calendar year.    The billing information is as follows and has been shared with [facility].   Member ID: [id number]  Group ID: [id number] PCN: [id number] BIN: [id number] Eligibility Dates: 03/16/20 to 03/15/21  Fund: Renal Cell  [name] CPHT Specialty Pharmacy Patient Advocate [facility] Phone [phone] Fax [phone] 03/20/2020 10:50 AM

## 2020-03-28 ENCOUNTER — Inpatient Hospital Stay: Payer: Medicare Other | Attending: Oncology

## 2020-03-28 ENCOUNTER — Other Ambulatory Visit: Payer: Self-pay

## 2020-03-28 DIAGNOSIS — D751 Secondary polycythemia: Secondary | ICD-10-CM | POA: Insufficient documentation

## 2020-03-28 DIAGNOSIS — Z85528 Personal history of other malignant neoplasm of kidney: Secondary | ICD-10-CM | POA: Insufficient documentation

## 2020-03-28 DIAGNOSIS — Z905 Acquired absence of kidney: Secondary | ICD-10-CM | POA: Diagnosis not present

## 2020-03-28 DIAGNOSIS — Z79899 Other long term (current) drug therapy: Secondary | ICD-10-CM | POA: Insufficient documentation

## 2020-03-28 DIAGNOSIS — E039 Hypothyroidism, unspecified: Secondary | ICD-10-CM

## 2020-03-28 DIAGNOSIS — I10 Essential (primary) hypertension: Secondary | ICD-10-CM | POA: Insufficient documentation

## 2020-03-28 DIAGNOSIS — Z7982 Long term (current) use of aspirin: Secondary | ICD-10-CM | POA: Diagnosis not present

## 2020-03-28 DIAGNOSIS — R918 Other nonspecific abnormal finding of lung field: Secondary | ICD-10-CM | POA: Insufficient documentation

## 2020-03-28 DIAGNOSIS — C649 Malignant neoplasm of unspecified kidney, except renal pelvis: Secondary | ICD-10-CM

## 2020-03-28 DIAGNOSIS — J439 Emphysema, unspecified: Secondary | ICD-10-CM | POA: Diagnosis not present

## 2020-03-28 DIAGNOSIS — Z9079 Acquired absence of other genital organ(s): Secondary | ICD-10-CM | POA: Diagnosis not present

## 2020-03-28 LAB — CMP (CANCER CENTER ONLY)
ALT: 12 U/L (ref 0–44)
AST: 16 U/L (ref 15–41)
Albumin: 4.1 g/dL (ref 3.5–5.0)
Alkaline Phosphatase: 55 U/L (ref 38–126)
Anion gap: 8 (ref 5–15)
BUN: 20 mg/dL (ref 8–23)
CO2: 30 mmol/L (ref 22–32)
Calcium: 9.8 mg/dL (ref 8.9–10.3)
Chloride: 104 mmol/L (ref 98–111)
Creatinine: 1.52 mg/dL — ABNORMAL HIGH (ref 0.61–1.24)
GFR, Estimated: 48 mL/min — ABNORMAL LOW (ref >60)
Glucose, Bld: 94 mg/dL (ref 70–99)
Potassium: 4.2 mmol/L (ref 3.5–5.1)
Sodium: 142 mmol/L (ref 135–145)
Total Bilirubin: 0.9 mg/dL (ref 0.3–1.2)
Total Protein: 7.5 g/dL (ref 6.5–8.1)

## 2020-03-28 LAB — CBC WITH DIFFERENTIAL (CANCER CENTER ONLY)
Abs Immature Granulocytes: 0.02 10*3/uL (ref 0.00–0.07)
Basophils Absolute: 0.1 10*3/uL (ref 0.0–0.1)
Basophils Relative: 1 %
Eosinophils Absolute: 0.4 10*3/uL (ref 0.0–0.5)
Eosinophils Relative: 5 %
HCT: 51.4 % (ref 39.0–52.0)
Hemoglobin: 17.9 g/dL — ABNORMAL HIGH (ref 13.0–17.0)
Immature Granulocytes: 0 %
Lymphocytes Relative: 22 %
Lymphs Abs: 1.6 10*3/uL (ref 0.7–4.0)
MCH: 29.4 pg (ref 26.0–34.0)
MCHC: 34.8 g/dL (ref 30.0–36.0)
MCV: 84.4 fL (ref 80.0–100.0)
Monocytes Absolute: 0.8 10*3/uL (ref 0.1–1.0)
Monocytes Relative: 10 %
Neutro Abs: 4.6 10*3/uL (ref 1.7–7.7)
Neutrophils Relative %: 62 %
Platelet Count: 216 10*3/uL (ref 150–400)
RBC: 6.09 MIL/uL — ABNORMAL HIGH (ref 4.22–5.81)
RDW: 13.2 % (ref 11.5–15.5)
WBC Count: 7.5 10*3/uL (ref 4.0–10.5)
nRBC: 0 % (ref 0.0–0.2)

## 2020-03-29 ENCOUNTER — Ambulatory Visit (HOSPITAL_COMMUNITY)
Admission: RE | Admit: 2020-03-29 | Discharge: 2020-03-29 | Disposition: A | Discharge status: Home / Self Care | Payer: Medicare Other | Source: Ambulatory Visit | Attending: Oncology | Admitting: Oncology

## 2020-03-29 ENCOUNTER — Encounter (HOSPITAL_COMMUNITY): Payer: Self-pay

## 2020-03-29 DIAGNOSIS — K5730 Diverticulosis of large intestine without perforation or abscess without bleeding: Secondary | ICD-10-CM | POA: Diagnosis not present

## 2020-03-29 DIAGNOSIS — N2889 Other specified disorders of kidney and ureter: Secondary | ICD-10-CM | POA: Insufficient documentation

## 2020-03-29 DIAGNOSIS — C649 Malignant neoplasm of unspecified kidney, except renal pelvis: Secondary | ICD-10-CM | POA: Insufficient documentation

## 2020-03-29 DIAGNOSIS — R911 Solitary pulmonary nodule: Secondary | ICD-10-CM | POA: Diagnosis not present

## 2020-03-29 DIAGNOSIS — C7900 Secondary malignant neoplasm of unspecified kidney and renal pelvis: Secondary | ICD-10-CM | POA: Diagnosis not present

## 2020-03-29 DIAGNOSIS — R918 Other nonspecific abnormal finding of lung field: Secondary | ICD-10-CM | POA: Diagnosis not present

## 2020-03-29 LAB — TSH: TSH: 1.126 u[IU]/mL (ref 0.320–4.118)

## 2020-03-29 MED ORDER — IOHEXOL 300 MG/ML  SOLN
100.0000 mL | Freq: Once | INTRAMUSCULAR | Status: AC | PRN
  Administered 2020-03-29: 100 mL via INTRAVENOUS

## 2020-04-04 ENCOUNTER — Other Ambulatory Visit: Payer: Self-pay

## 2020-04-04 ENCOUNTER — Inpatient Hospital Stay (HOSPITAL_BASED_OUTPATIENT_CLINIC_OR_DEPARTMENT_OTHER): Payer: Medicare Other | Admitting: Oncology

## 2020-04-04 VITALS — BP 128/101 | HR 79 | Temp 97.6°F | Resp 18 | Ht 67.0 in | Wt 191.7 lb

## 2020-04-04 DIAGNOSIS — N2889 Other specified disorders of kidney and ureter: Secondary | ICD-10-CM | POA: Diagnosis not present

## 2020-04-04 DIAGNOSIS — Z9079 Acquired absence of other genital organ(s): Secondary | ICD-10-CM | POA: Diagnosis not present

## 2020-04-04 DIAGNOSIS — C649 Malignant neoplasm of unspecified kidney, except renal pelvis: Secondary | ICD-10-CM

## 2020-04-04 DIAGNOSIS — I10 Essential (primary) hypertension: Secondary | ICD-10-CM | POA: Diagnosis not present

## 2020-04-04 DIAGNOSIS — Z85528 Personal history of other malignant neoplasm of kidney: Secondary | ICD-10-CM | POA: Diagnosis not present

## 2020-04-04 DIAGNOSIS — R918 Other nonspecific abnormal finding of lung field: Secondary | ICD-10-CM | POA: Diagnosis not present

## 2020-04-04 DIAGNOSIS — Z905 Acquired absence of kidney: Secondary | ICD-10-CM | POA: Diagnosis not present

## 2020-04-04 DIAGNOSIS — D751 Secondary polycythemia: Secondary | ICD-10-CM | POA: Diagnosis not present

## 2020-04-04 NOTE — Progress Notes (Signed)
Hematology and Oncology Follow Up Visit  [name] [id number] 1945-09-07 75 y.o. 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   Prior Therapy:   Therapeutic phlebotomy as needed.  He status post radical nephrectomy April 2020.  The final pathology showed clear cell histology invading into the perineural fat indicating stage T3a.  Tumor margins are negative.  He status post orchiectomy completed on 10/29/2018.  He final pathology showed metastatic carcinoma consistent with clear cell histology.   Axitinib 5 mg daily with pembrolizumab 200 mg every 3 weeks started on 01/26/2019.   Therapy discontinued in May 2021 due to patient preference and complete response to therapy.  He completed 11 cycles of therapy.     Current therapy:   Active surveillance and intermittent phlebotomy.  Interim History:  [name] is here for repeat evaluation.  Since last visit, he reports no major changes in his health.  He denies any recent hospitalizations or illnesses.  Denies any weight loss or appetite changes.  He does report pruritus that has been intermittent and not problematic at this time.  Denies any changes in his bowel habits.    .               Medications: Reviewed without changes. Current Outpatient Medications  Medication Sig Dispense Refill  . aspirin EC 81 MG tablet Take 81 mg by mouth daily. Swallow whole.    . busPIRone (BUSPAR) 5 MG tablet Take 5 mg by mouth 2 (two) times daily as needed (for anxiety).    . fluticasone (FLONASE) 50 MCG/ACT nasal spray Place 1 spray into both nostrils daily as needed for allergies.     . INLYTA 5 MG tablet TAKE 1 TABLET BY MOUTH TWICE DAILY. TAKE 12 HOURS APART WITH WATER, WITH OR WITHOUT FOOD  (Patient not taking: Reported on 01/18/2020) 60 tablet 0  . lidocaine-prilocaine (EMLA) cream Apply 1 application topically as needed. (Patient not taking: Reported on 01/18/2020) 30 g 0  . LORazepam (ATIVAN) 1 MG tablet Take 1 tablet (1 mg total) by mouth every 8 (eight) hours. (Patient not taking: Reported on 01/18/2020) 30 tablet 0  . losartan-hydrochlorothiazide (HYZAAR) 100-25 MG tablet Take 1 tablet by mouth daily.    . meloxicam (MOBIC) 15 MG tablet Take 15 mg by mouth as needed for pain. (Patient not taking: Reported on 01/18/2020)    . pravastatin (PRAVACHOL) 40 MG tablet Take 40 mg by mouth daily.    . prochlorperazine (COMPAZINE) 10 MG tablet Take 1 tablet (10 mg total) by mouth every 6 (six) hours as needed for nausea or vomiting. (Patient not taking: Reported on 01/18/2020) 30 tablet 0   No current facility-administered medications for this visit.     Allergies:  Allergies  Allergen Reactions  . Oxycodone Other (See Comments)    Patient stated,"it keeps me wide awake and I can't sleep."  . Septra [Sulfamethoxazole-Trimethoprim] Hives and Itching      Physical exam:        Blood pressure (!) 128/101, pulse 79, temperature 97.6 F (36.4 C), temperature source Tympanic, resp. rate 18, height 5\' 7"  (1.702 m), weight 191 lb 11.2 oz (87 kg), SpO2 99 %.        ECOG: 0   General appearance: Alert, awake without any distress. Head: Atraumatic  without abnormalities Oropharynx: Without any thrush or ulcers. Eyes: No scleral icterus. Lymph nodes: No lymphadenopathy noted in the cervical, supraclavicular, or axillary nodes Heart:regular rate and rhythm, without any murmurs or gallops.   Lung: Clear to auscultation without any rhonchi, wheezes or dullness to percussion. Abdomin: Soft, nontender without any shifting dullness or ascites. Musculoskeletal: No clubbing or cyanosis. Neurological: No motor or sensory deficits. Skin: No rashes or  lesions.                      Lab Results: Lab Results  Component Value Date   WBC 7.5 03/28/2020   HGB 17.9 (H) 03/28/2020   HCT 51.4 03/28/2020   MCV 84.4 03/28/2020   PLT 216 03/28/2020     Chemistry      Component Value Date/Time   NA 142 03/28/2020 1440   NA 141 01/18/2015 0835   K 4.2 03/28/2020 1440   K 4.1 01/18/2015 0835   CL 104 03/28/2020 1440   CO2 30 03/28/2020 1440   CO2 25 01/18/2015 0835   BUN 20 03/28/2020 1440   BUN 21.5 01/18/2015 0835   CREATININE 1.52 (H) 03/28/2020 1440   CREATININE 1.1 01/18/2015 0835      Component Value Date/Time   CALCIUM 9.8 03/28/2020 1440   CALCIUM 9.8 01/18/2015 0835   ALKPHOS 55 03/28/2020 1440   ALKPHOS 66 01/18/2015 0835   AST 16 03/28/2020 1440   AST 15 01/18/2015 0835   ALT 12 03/28/2020 1440   ALT 16 01/18/2015 0835   BILITOT 0.9 03/28/2020 1440   BILITOT 0.91 01/18/2015 0835      IMPRESSION: 1. Stable exam. No new or progressive findings to suggest recurrent or metastatic disease in the chest, abdomen, or pelvis. 2. Status post right nephrectomy. Small retrocaval nodule/lymph nodes described previously are stable in the interval. 3. Stable 3 mm left lower lobe pulmonary nodule. Continued attention on follow-up recommended. 4. Left colonic diverticulosis without diverticulitis. 5. Aortic Atherosclerosis (ICD10-I70.0) and Emphysema (ICD10-J43.9).    Impression and Plan:  75 year old man with:    1. Stage IV clear-cell renal cell carcinoma diagnosed in 2020 after presenting with advanced disease involving the spermatic cord and questionable hepatic metastasis.   He is currently on active surveillance without any evidence of recurrent disease.  CT scan obtained on March 29, 2020 was personally reviewed and showed no evidence of measurable disease at this time.  The natural course of this disease and treatment options for the future were discussed.  Different salvage therapy including  oral targeted agents such as cabozantinib or immunotherapy retreatment with ipilimumab and nivolumab were discussed.  For the time being we will continue with active surveillance and repeat imaging studies in 6 months.   2.  Hypertension: Mildly elevated today but has been within normal range otherwise.   3.  Secondary polycythemia: His hemoglobin is mildly elevated but asymptomatic at this time.  4.  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  No additional intervention is recommended.  5. Followup: In 6 months for repeat follow-up   30 minutes were dedicated to this visit.  The time was spent on reviewing disease status, discussing treatment options and future plan of care review.  Zola Button, MD 1/20/202210:29 AM

## 2020-04-05 ENCOUNTER — Other Ambulatory Visit: Payer: Medicare Other

## 2020-08-27 IMAGING — CT CT ABD-PEL WO/W CM
4 of 17 series · 9 of 46 positions shown, 15 images · IV contrast (omnipaque)
Comparison: 04/12/2019 CT chest, abdomen and pelvis.

CLINICAL DATA: Metastatic renal cell carcinoma status post right
nephrectomy June 2018 and right radical orchiectomy 10/29/2018.
Ongoing chemotherapy. Restaging.

EXAM:
CT CHEST, ABDOMEN, AND PELVIS WITH CONTRAST
TECHNIQUE: Multidetector CT imaging of the chest, abdomen and pelvis was
performed following the standard protocol during bolus
administration of intravenous contrast.
CONTRAST:  100mL OMNIPAQUE IOHEXOL 300 MG/ML  SOLN

[Series 2: axial pre · axial · non-contrast · 0.78mm/px · z∈[+1293,+1374]mm · 2 of 83 slices shown]
[im 28/83  soft-tissue]
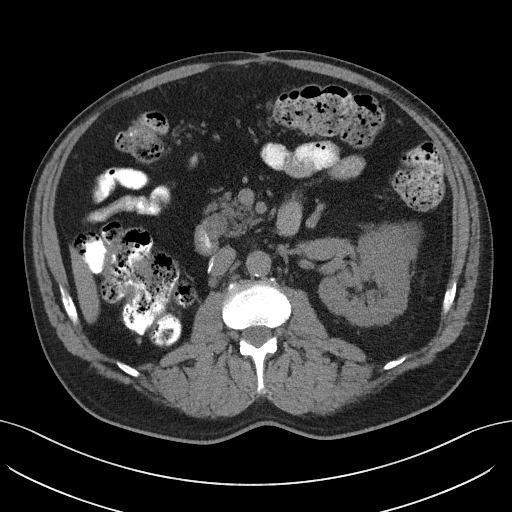
[im 55/83  soft-tissue]
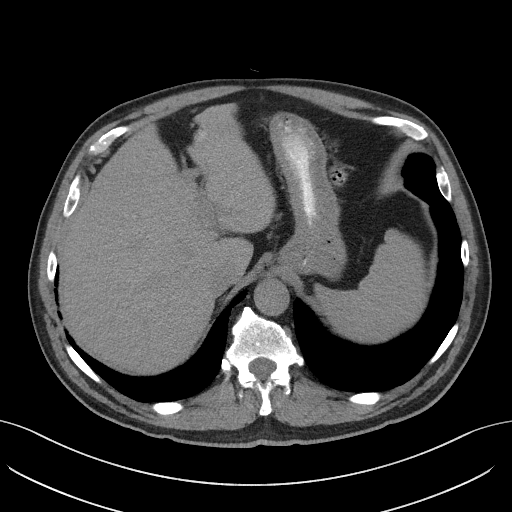

[Series 3: coronal pre · coronal · non-contrast · 0.48mm/px · 1 of 113 slices shown, 2 images]
[im 57/113  soft-tissue]
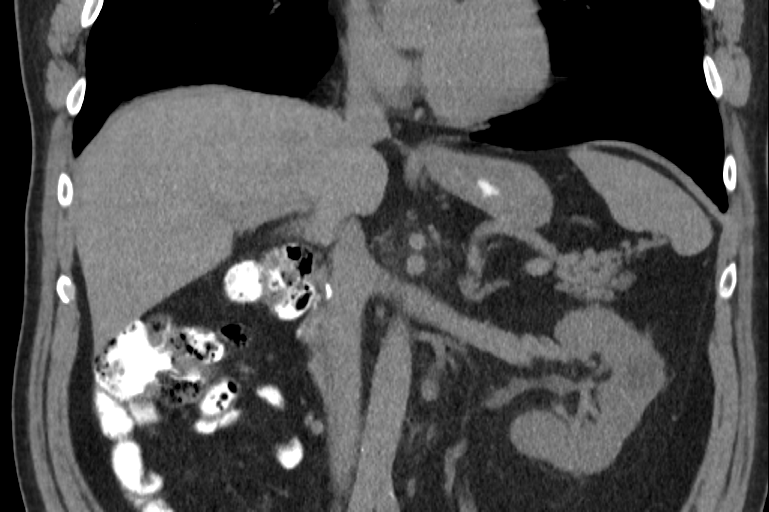
[im 57/113  bone]
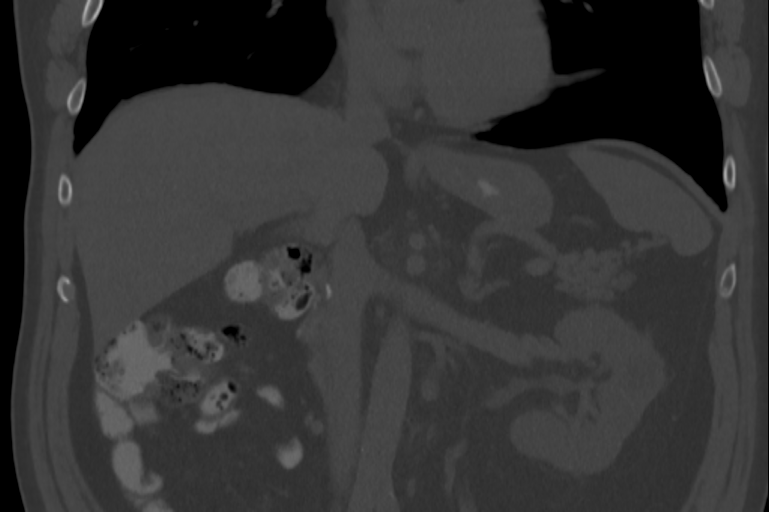

[Series 11: chest with · axial · 0.78mm/px · z∈[+1444,+1555]mm · 2 of 112 slices shown]
[im 38/112  soft-tissue]
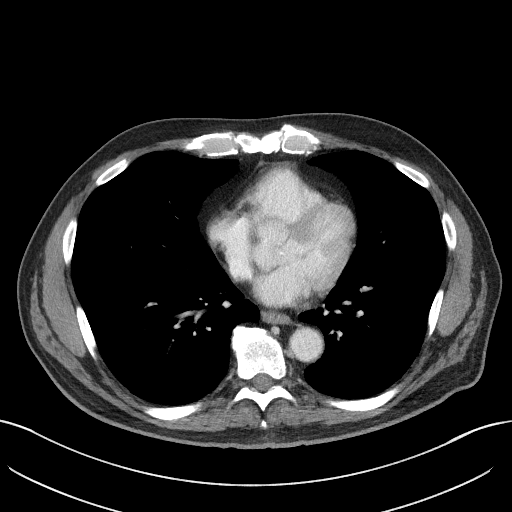
[im 75/112  soft-tissue]
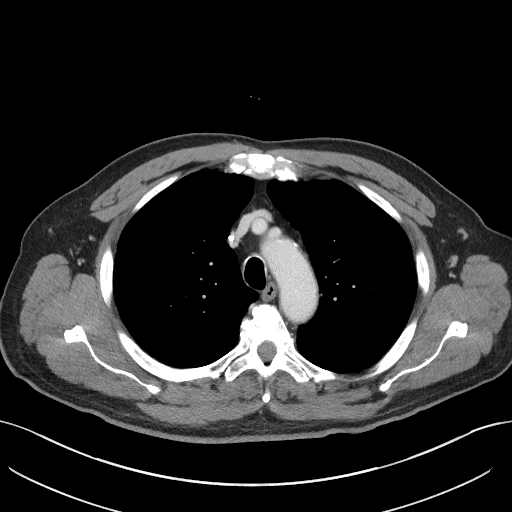

[Series 16: axial nephro · axial · 0.78mm/px · z∈[+1100,+1355]mm · 4 of 143 slices shown, 9 images]
[im 29/143  soft-tissue]
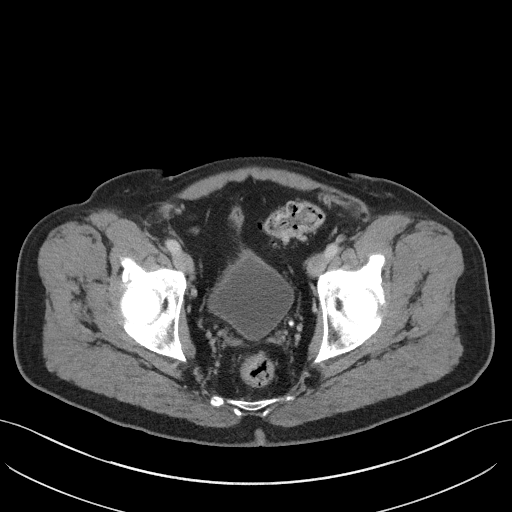
[im 29/143  lung]
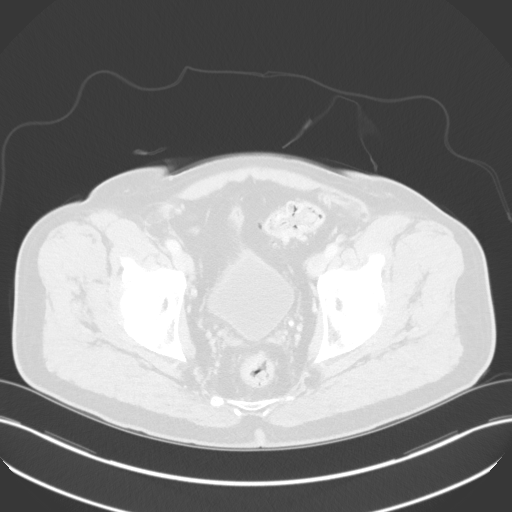
[im 29/143  bone]
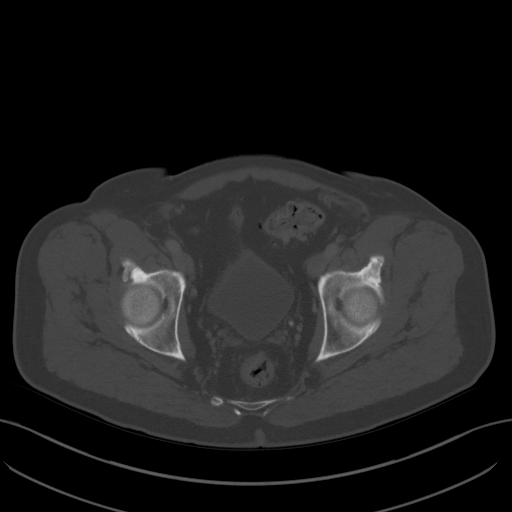
[im 57/143  soft-tissue]
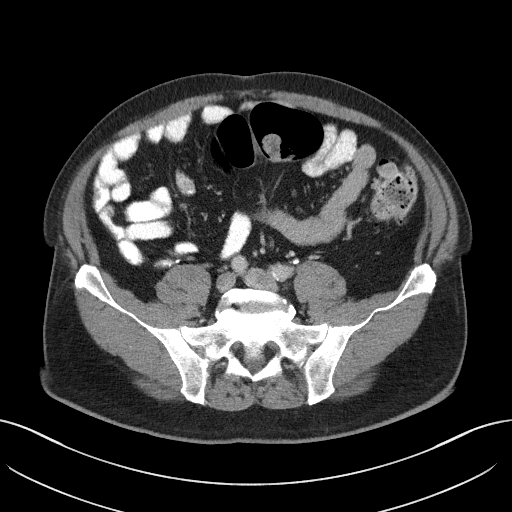
[im 57/143  lung]
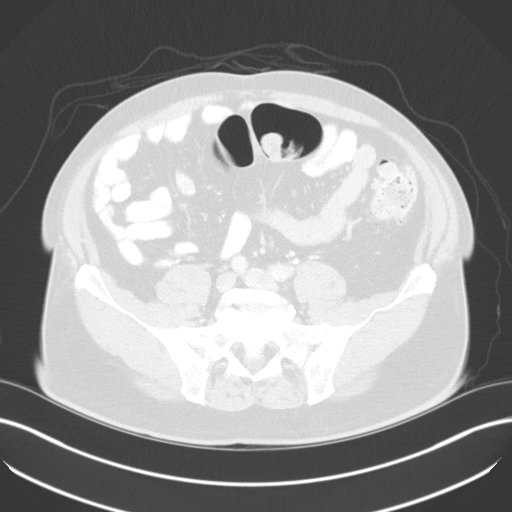
[im 86/143  soft-tissue]
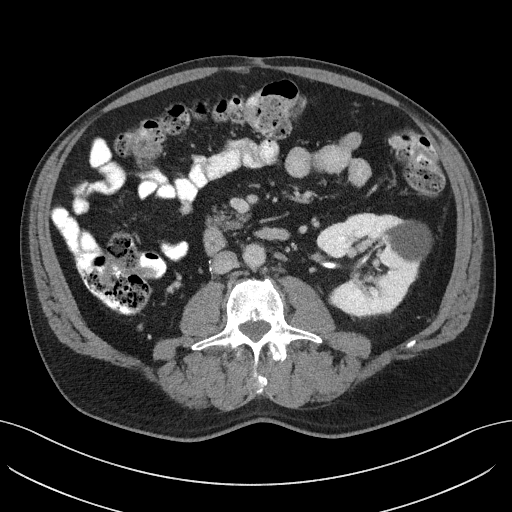
[im 86/143  lung]
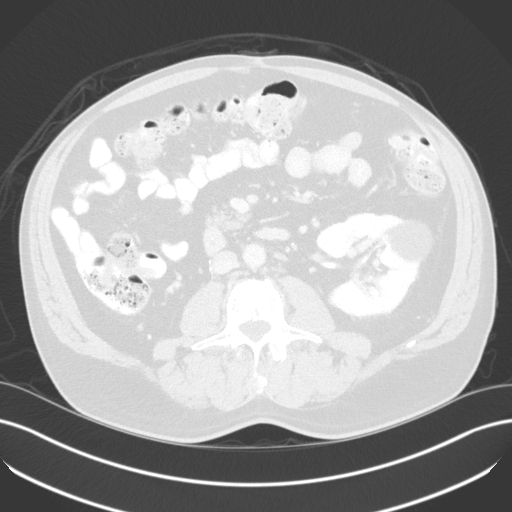
[im 114/143  soft-tissue]
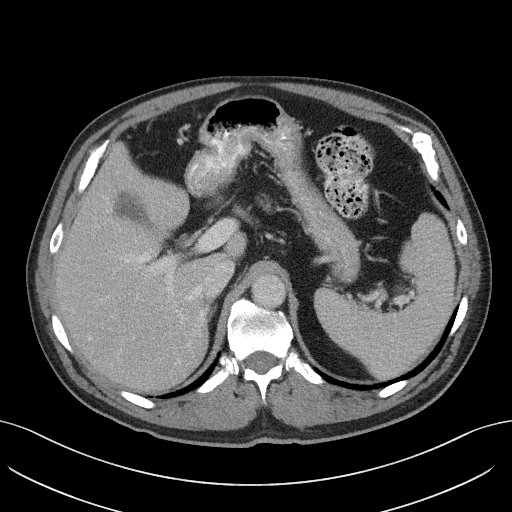
[im 114/143  lung]
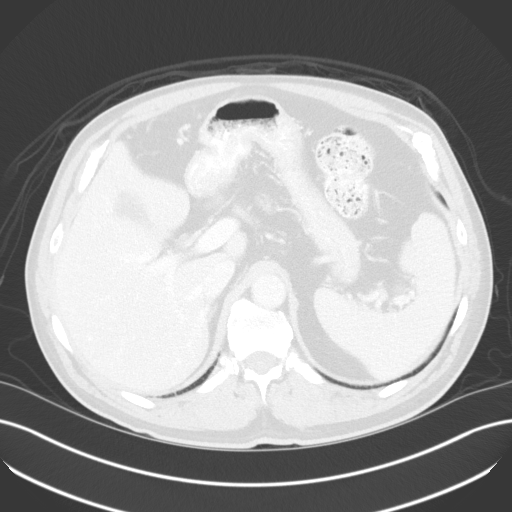

[9 of 46 positions shown; findings below may reference images not displayed]

FINDINGS: CT CHEST FINDINGS

Cardiovascular: Normal heart size. No significant pericardial
effusion/thickening. Right internal jugular Port-A-Cath terminates
in the middle third of the SVC. Atherosclerotic nonaneurysmal
thoracic aorta. Normal caliber pulmonary arteries. No central
pulmonary emboli.

Mediastinum/Nodes: Hypodense 1.0 cm inferior right thyroid nodule is
stable. Not clinically significant; no follow-up imaging recommended
(ref: [HOSPITAL]. [DATE]): 143-50). Unremarkable
esophagus. No pathologically enlarged axillary, mediastinal or hilar
lymph nodes.

Lungs/Pleura: No pneumothorax. No pleural effusion. Mild
centrilobular and paraseptal emphysema. No acute consolidative
airspace disease, lung masses or significant pulmonary nodules.

Musculoskeletal: No aggressive appearing focal osseous lesions. Mild
thoracic spondylosis.

CT ABDOMEN PELVIS FINDINGS

Hepatobiliary: Normal liver size. Previously described tiny
low-attenuation 0.7 cm anterior inferior right liver lesion is not
discretely visualized on today's scan. No liver masses. Normal
gallbladder with no radiopaque cholelithiasis. No biliary ductal
dilatation.

Pancreas: Normal, with no mass or duct dilation.

Spleen: Normal size. No mass.

Adrenals/Urinary Tract: No discrete adrenal nodules. Status post
right nephrectomy. No residual or recurrent mass in the right
nephrectomy bed. Tiny residual 0.7 x 0.6 cm soft tissue nodule along
the course of right testicular vein in the right lower quadrant
(series 16/image 97), decreased from 1.0 x 0.8 cm on 04/12/2019 CT.
No left hydronephrosis. Simple 3.7 cm interpolar left renal cyst.
Additional scattered subcentimeter hypodense left renal cortical
lesions are too small to characterize and unchanged. No new left
renal lesions. No left renal stones. Normal bladder.

Stomach/Bowel: Normal non-distended stomach. Normal caliber small
bowel with no small bowel wall thickening. Normal appendix. Oral
contrast transits to the colon. Moderate left colonic
diverticulosis, with no large bowel wall thickening or significant
pericolonic fat stranding.

Vascular/Lymphatic: Mildly atherosclerotic nonaneurysmal abdominal
aorta. Patent portal, splenic, hepatic and left renal veins. No
pathologically enlarged lymph nodes in the abdomen or pelvis.
Top-normal 0.8 cm right paracaval node (series 16/image 50) is
stable.

Reproductive: Normal size prostate with stable brachytherapy seeds.

Other: No pneumoperitoneum, ascites or focal fluid collection.

Musculoskeletal: No aggressive appearing focal osseous lesions. Mild
lumbar spondylosis.
IMPRESSION: 1. Continued positive response to therapy. Solitary right liver
metastasis has resolved. Tiny residual soft tissue nodule in the
right lower quadrant along the course of the right testicular vein,
decreased.
2. No new or progressive metastatic disease in the chest, abdomen or
pelvis.
3. Aortic Atherosclerosis (DLZB8-AQY.Y) and Emphysema (DLZB8-4SC.A).
Additional chronic findings as detailed.

## 2020-09-02 ENCOUNTER — Telehealth: Payer: Self-pay | Admitting: Oncology

## 2020-09-02 NOTE — Telephone Encounter (Signed)
R/s appt per 6/20 sch msg. Pt aware.  

## 2020-10-02 ENCOUNTER — Other Ambulatory Visit: Payer: Medicare Other

## 2020-10-04 ENCOUNTER — Telehealth: Payer: Self-pay | Admitting: Oncology

## 2020-10-04 NOTE — Telephone Encounter (Signed)
Rescheduled 07/28 provider appointment to 08/18 per provider pal, patient has been called and notified.

## 2020-10-08 DIAGNOSIS — R7989 Other specified abnormal findings of blood chemistry: Secondary | ICD-10-CM | POA: Diagnosis not present

## 2020-10-08 DIAGNOSIS — E785 Hyperlipidemia, unspecified: Secondary | ICD-10-CM | POA: Diagnosis not present

## 2020-10-08 DIAGNOSIS — Z125 Encounter for screening for malignant neoplasm of prostate: Secondary | ICD-10-CM | POA: Diagnosis not present

## 2020-10-08 DIAGNOSIS — R7309 Other abnormal glucose: Secondary | ICD-10-CM | POA: Diagnosis not present

## 2020-10-08 DIAGNOSIS — I10 Essential (primary) hypertension: Secondary | ICD-10-CM | POA: Diagnosis not present

## 2020-10-08 DIAGNOSIS — Z8546 Personal history of malignant neoplasm of prostate: Secondary | ICD-10-CM | POA: Diagnosis not present

## 2020-10-09 ENCOUNTER — Ambulatory Visit: Payer: Medicare Other | Admitting: Oncology

## 2020-10-14 DIAGNOSIS — I700 Atherosclerosis of aorta: Secondary | ICD-10-CM | POA: Diagnosis not present

## 2020-10-14 DIAGNOSIS — Z23 Encounter for immunization: Secondary | ICD-10-CM | POA: Diagnosis not present

## 2020-10-14 DIAGNOSIS — R7989 Other specified abnormal findings of blood chemistry: Secondary | ICD-10-CM | POA: Diagnosis not present

## 2020-10-14 DIAGNOSIS — D45 Polycythemia vera: Secondary | ICD-10-CM | POA: Diagnosis not present

## 2020-10-14 DIAGNOSIS — E785 Hyperlipidemia, unspecified: Secondary | ICD-10-CM | POA: Diagnosis not present

## 2020-10-14 DIAGNOSIS — I10 Essential (primary) hypertension: Secondary | ICD-10-CM | POA: Diagnosis not present

## 2020-10-14 DIAGNOSIS — K219 Gastro-esophageal reflux disease without esophagitis: Secondary | ICD-10-CM | POA: Diagnosis not present

## 2020-10-14 DIAGNOSIS — Z8546 Personal history of malignant neoplasm of prostate: Secondary | ICD-10-CM | POA: Diagnosis not present

## 2020-10-14 DIAGNOSIS — Z0000 Encounter for general adult medical examination without abnormal findings: Secondary | ICD-10-CM | POA: Diagnosis not present

## 2020-10-14 DIAGNOSIS — R7309 Other abnormal glucose: Secondary | ICD-10-CM | POA: Diagnosis not present

## 2020-10-14 DIAGNOSIS — C649 Malignant neoplasm of unspecified kidney, except renal pelvis: Secondary | ICD-10-CM | POA: Diagnosis not present

## 2020-10-28 ENCOUNTER — Other Ambulatory Visit: Payer: Self-pay

## 2020-10-28 ENCOUNTER — Ambulatory Visit (HOSPITAL_COMMUNITY)
Admission: RE | Admit: 2020-10-28 | Discharge: 2020-10-28 | Disposition: A | Discharge status: Home / Self Care | Payer: Medicare Other | Source: Ambulatory Visit | Attending: Oncology | Admitting: Oncology

## 2020-10-28 ENCOUNTER — Inpatient Hospital Stay: Payer: Medicare Other | Attending: Oncology

## 2020-10-28 DIAGNOSIS — I10 Essential (primary) hypertension: Secondary | ICD-10-CM | POA: Diagnosis not present

## 2020-10-28 DIAGNOSIS — Z905 Acquired absence of kidney: Secondary | ICD-10-CM | POA: Diagnosis not present

## 2020-10-28 DIAGNOSIS — I700 Atherosclerosis of aorta: Secondary | ICD-10-CM | POA: Diagnosis not present

## 2020-10-28 DIAGNOSIS — N2889 Other specified disorders of kidney and ureter: Secondary | ICD-10-CM

## 2020-10-28 DIAGNOSIS — C649 Malignant neoplasm of unspecified kidney, except renal pelvis: Secondary | ICD-10-CM

## 2020-10-28 DIAGNOSIS — D751 Secondary polycythemia: Secondary | ICD-10-CM | POA: Diagnosis not present

## 2020-10-28 DIAGNOSIS — Z7982 Long term (current) use of aspirin: Secondary | ICD-10-CM | POA: Diagnosis not present

## 2020-10-28 DIAGNOSIS — R918 Other nonspecific abnormal finding of lung field: Secondary | ICD-10-CM

## 2020-10-28 DIAGNOSIS — R911 Solitary pulmonary nodule: Secondary | ICD-10-CM | POA: Diagnosis not present

## 2020-10-28 DIAGNOSIS — Z85528 Personal history of other malignant neoplasm of kidney: Secondary | ICD-10-CM | POA: Diagnosis not present

## 2020-10-28 DIAGNOSIS — Z79899 Other long term (current) drug therapy: Secondary | ICD-10-CM | POA: Diagnosis not present

## 2020-10-28 DIAGNOSIS — Z9079 Acquired absence of other genital organ(s): Secondary | ICD-10-CM | POA: Diagnosis not present

## 2020-10-28 DIAGNOSIS — C641 Malignant neoplasm of right kidney, except renal pelvis: Secondary | ICD-10-CM | POA: Diagnosis not present

## 2020-10-28 DIAGNOSIS — E039 Hypothyroidism, unspecified: Secondary | ICD-10-CM

## 2020-10-28 DIAGNOSIS — J439 Emphysema, unspecified: Secondary | ICD-10-CM | POA: Diagnosis not present

## 2020-10-28 LAB — CMP (CANCER CENTER ONLY)
ALT: 14 U/L (ref 0–44)
AST: 17 U/L (ref 15–41)
Albumin: 4.1 g/dL (ref 3.5–5.0)
Alkaline Phosphatase: 55 U/L (ref 38–126)
Anion gap: 10 (ref 5–15)
BUN: 22 mg/dL (ref 8–23)
CO2: 27 mmol/L (ref 22–32)
Calcium: 9.6 mg/dL (ref 8.9–10.3)
Chloride: 106 mmol/L (ref 98–111)
Creatinine: 1.54 mg/dL — ABNORMAL HIGH (ref 0.61–1.24)
GFR, Estimated: 47 mL/min — ABNORMAL LOW (ref >60)
Glucose, Bld: 104 mg/dL — ABNORMAL HIGH (ref 70–99)
Potassium: 4.4 mmol/L (ref 3.5–5.1)
Sodium: 143 mmol/L (ref 135–145)
Total Bilirubin: 0.7 mg/dL (ref 0.3–1.2)
Total Protein: 7.3 g/dL (ref 6.5–8.1)

## 2020-10-28 LAB — CBC WITH DIFFERENTIAL (CANCER CENTER ONLY)
Abs Immature Granulocytes: 0.05 10*3/uL (ref 0.00–0.07)
Basophils Absolute: 0.1 10*3/uL (ref 0.0–0.1)
Basophils Relative: 1 %
Eosinophils Absolute: 0.4 10*3/uL (ref 0.0–0.5)
Eosinophils Relative: 7 %
HCT: 53.5 % — ABNORMAL HIGH (ref 39.0–52.0)
Hemoglobin: 18.1 g/dL — ABNORMAL HIGH (ref 13.0–17.0)
Immature Granulocytes: 1 %
Lymphocytes Relative: 22 %
Lymphs Abs: 1.3 10*3/uL (ref 0.7–4.0)
MCH: 28.8 pg (ref 26.0–34.0)
MCHC: 33.8 g/dL (ref 30.0–36.0)
MCV: 85.1 fL (ref 80.0–100.0)
Monocytes Absolute: 0.6 10*3/uL (ref 0.1–1.0)
Monocytes Relative: 10 %
Neutro Abs: 3.4 10*3/uL (ref 1.7–7.7)
Neutrophils Relative %: 59 %
Platelet Count: 196 10*3/uL (ref 150–400)
RBC: 6.29 MIL/uL — ABNORMAL HIGH (ref 4.22–5.81)
RDW: 13.2 % (ref 11.5–15.5)
WBC Count: 5.8 10*3/uL (ref 4.0–10.5)
nRBC: 0 % (ref 0.0–0.2)

## 2020-10-28 LAB — TSH: TSH: 1.331 u[IU]/mL (ref 0.320–4.118)

## 2020-10-28 MED ORDER — IOHEXOL 350 MG/ML SOLN
80.0000 mL | Freq: Once | INTRAVENOUS | Status: AC | PRN
  Administered 2020-10-28: 80 mL via INTRAVENOUS

## 2020-10-31 ENCOUNTER — Encounter: Payer: Self-pay | Admitting: Oncology

## 2020-10-31 ENCOUNTER — Other Ambulatory Visit: Payer: Self-pay

## 2020-10-31 ENCOUNTER — Inpatient Hospital Stay (HOSPITAL_BASED_OUTPATIENT_CLINIC_OR_DEPARTMENT_OTHER): Payer: Medicare Other | Admitting: Oncology

## 2020-10-31 VITALS — BP 145/94 | HR 62 | Temp 97.2°F | Resp 18 | Ht 67.0 in | Wt 191.8 lb

## 2020-10-31 DIAGNOSIS — Z9079 Acquired absence of other genital organ(s): Secondary | ICD-10-CM | POA: Diagnosis not present

## 2020-10-31 DIAGNOSIS — I10 Essential (primary) hypertension: Secondary | ICD-10-CM | POA: Diagnosis not present

## 2020-10-31 DIAGNOSIS — R918 Other nonspecific abnormal finding of lung field: Secondary | ICD-10-CM | POA: Diagnosis not present

## 2020-10-31 DIAGNOSIS — Z85528 Personal history of other malignant neoplasm of kidney: Secondary | ICD-10-CM | POA: Diagnosis not present

## 2020-10-31 DIAGNOSIS — Z7982 Long term (current) use of aspirin: Secondary | ICD-10-CM | POA: Diagnosis not present

## 2020-10-31 DIAGNOSIS — Z905 Acquired absence of kidney: Secondary | ICD-10-CM | POA: Diagnosis not present

## 2020-10-31 DIAGNOSIS — C649 Malignant neoplasm of unspecified kidney, except renal pelvis: Secondary | ICD-10-CM

## 2020-10-31 DIAGNOSIS — N2889 Other specified disorders of kidney and ureter: Secondary | ICD-10-CM

## 2020-10-31 DIAGNOSIS — D751 Secondary polycythemia: Secondary | ICD-10-CM | POA: Diagnosis not present

## 2020-10-31 NOTE — Progress Notes (Signed)
Hematology and Oncology Follow Up Visit  [name] [id number] [birth year] 75 y.o. 10/31/2020 9:00 AM Jennette Bill, [place], [place]   Principle Diagnosis: 75 year old man with kidney cancer diagnosed in 2020.  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  He has no active disease after receiving therapy.   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   Prior Therapy:   Therapeutic phlebotomy as needed.  He status post radical nephrectomy April 2020.  The final pathology showed clear cell histology invading into the perineural fat indicating stage T3a.  Tumor margins are negative.  He status post orchiectomy completed on 10/29/2018.  He final pathology showed metastatic carcinoma consistent with clear cell histology.   Axitinib 5 mg daily with pembrolizumab 200 mg every 3 weeks started on 01/26/2019.   Therapy discontinued in May 2021 due to patient preference and complete response to therapy.  He completed 11 cycles of therapy.     Current therapy:   Active surveillance and intermittent phlebotomy.  Interim History:  [name] presents today for a follow-up visit.  Since the last visit, he reports no major changes in his health.  He denies any recent hospitalizations or illnesses.  He denies any chest pain or shortness of breath.  He does have mild pruritus which has improved since the last visit.  He denies any bone pain or pathological fractures.  He denies any constitutional symptoms or weight loss.  Continues to enjoy fishing without any decline in his performance status.    .               Medications: Unchanged on review. Current Outpatient Medications  Medication Sig Dispense Refill   aspirin EC 81 MG tablet Take 81 mg by mouth daily. Swallow whole.     busPIRone (BUSPAR) 5 MG tablet Take 5 mg by mouth 2 (two) times daily as needed (for anxiety).      fluticasone (FLONASE) 50 MCG/ACT nasal spray Place 1 spray into both nostrils daily as needed for allergies.      INLYTA 5 MG tablet TAKE 1 TABLET BY MOUTH TWICE DAILY. TAKE 12 HOURS APART WITH WATER, WITH OR WITHOUT FOOD (Patient not taking: Reported on 01/18/2020) 60 tablet 0   lidocaine-prilocaine (EMLA) cream Apply 1 application topically as needed. (Patient not taking: Reported on 01/18/2020) 30 g 0   LORazepam (ATIVAN) 1 MG tablet Take 1 tablet (1 mg total) by mouth every 8 (eight) hours. (Patient not taking: Reported on 01/18/2020) 30 tablet 0   losartan-hydrochlorothiazide (HYZAAR) 100-25 MG tablet Take 1 tablet by mouth daily.     meloxicam (MOBIC) 15 MG tablet Take 15 mg by mouth as needed for pain. (Patient not taking: Reported on 01/18/2020)     pravastatin (PRAVACHOL) 40 MG tablet Take 40 mg by mouth daily.     prochlorperazine (COMPAZINE) 10 MG tablet Take 1 tablet (10 mg total) by mouth every 6 (six) hours as needed for nausea or vomiting. (Patient not taking: Reported on 01/18/2020) 30 tablet 0   No current facility-administered medications for this visit.     Allergies:  Allergies  Allergen Reactions   Oxycodone Other (See Comments)    Patient stated,"it keeps me wide awake and I can't sleep."   Septra [Sulfamethoxazole-Trimethoprim] Hives and Itching      Physical exam:           Blood pressure (!) 145/94, pulse 62, temperature (!) 97.2 F (  36.2 C), temperature source Oral, resp. rate 18, height '5\' 7"'$  (1.702 m), weight 191 lb 12.8 oz (87 kg), SpO2 98 %.      ECOG: 0   General appearance: Comfortable appearing without any discomfort Head: Normocephalic without any trauma Oropharynx: Mucous membranes are moist and pink without any thrush or ulcers. Eyes: Pupils are equal and round reactive to light. Lymph nodes: No cervical, supraclavicular, inguinal or axillary lymphadenopathy.   Heart:regular rate and rhythm.  S1 and S2 without leg edema. Lung: Clear  without any rhonchi or wheezes.  No dullness to percussion. Abdomin: Soft, nontender, nondistended with good bowel sounds.  No hepatosplenomegaly. Musculoskeletal: No joint deformity or effusion.  Full range of motion noted. Neurological: No deficits noted on motor, sensory and deep tendon reflex exam. Skin: No petechial rash or dryness.  Appeared moist.                        Lab Results: Lab Results  Component Value Date   WBC 5.8 10/28/2020   HGB 18.1 (H) 10/28/2020   HCT 53.5 (H) 10/28/2020   MCV 85.1 10/28/2020   PLT 196 10/28/2020     Chemistry      Component Value Date/Time   NA 143 10/28/2020 0739   NA 141 01/18/2015 0835   K 4.4 10/28/2020 0739   K 4.1 01/18/2015 0835   CL 106 10/28/2020 0739   CO2 27 10/28/2020 0739   CO2 25 01/18/2015 0835   BUN 22 10/28/2020 0739   BUN 21.5 01/18/2015 0835   CREATININE 1.54 (H) 10/28/2020 0739   CREATININE 1.1 01/18/2015 0835      Component Value Date/Time   CALCIUM 9.6 10/28/2020 0739   CALCIUM 9.8 01/18/2015 0835   ALKPHOS 55 10/28/2020 0739   ALKPHOS 66 01/18/2015 0835   AST 17 10/28/2020 0739   AST 15 01/18/2015 0835   ALT 14 10/28/2020 0739   ALT 16 01/18/2015 0835   BILITOT 0.7 10/28/2020 0739   BILITOT 0.91 01/18/2015 0835        IMPRESSION: Status post right nephrectomy with no evidence of new or progressive disease in the chest, abdomen or pelvis.   Stable small retrocaval lymph nodes.   Stable left lower lobe pulmonary nodule.   Aortic Atherosclerosis (ICD10-I70.0) and Emphysema (ICD10-J43.9).     Impression and Plan:  75 year old man with:    1.  Kidney cancer diagnosed in 2020.  He developed stage IV clear-cell renal cell carcinoma and currently without any active disease after achieving a complete response to therapy outlined above.   His disease status was updated at this time and treatment choices were reviewed.  CT scan obtained on October 28, 2020 was personally  reviewed and showed no evidence of metastatic disease.  Based on these findings I recommended continued active surveillance and reinstitute therapy if he developed disease progression.  He had developed significant toxicity associated with immunotherapy but did not relate pruritus but did defer that option for the time being.  I recommended continued active surveillance and repeat imaging studies in 6 months.   2.  Hypertension: Blood pressure is mildly elevated but overall under control.   3.  Secondary polycythemia: Risks and benefits of reinitiating phlebotomy were reviewed at this time.  He has deferred this option given the lack of benefits previously.  4.  Pruritus: Continues to improve at this time.  This is related to autoimmune condition and immunotherapy.  5. Followup: He will return  in 6 months for repeat follow-up.   30 minutes were spent on this encounter.  The time was dedicated to reviewing laboratory data, disease status update, treatment choices discussion and future plan of care review.  Zola Button, MD 8/18/20229:00 AM

## 2020-12-06 IMAGING — CT CT CHEST W/ CM
2 of 13 series · 10 of 46 positions shown, 15 images · IV contrast (omnipaque)
Comparison: CT scan 07/11/2019

CLINICAL DATA: History of right nephrectomy for renal cell cancer.
Patient also has a history of prostate cancer.

EXAM:
CT CHEST WITH CONTRAST
CT ABDOMEN AND PELVIS WITH AND WITHOUT CONTRAST
TECHNIQUE: Multidetector CT imaging of the chest was performed during
intravenous contrast administration. Multidetector CT imaging of the
abdomen and pelvis was performed following the standard protocol
before and during bolus administration of intravenous contrast.
CONTRAST:  100mL OMNIPAQUE IOHEXOL 300 MG/ML  SOLN

[Series 4: coronal pre · coronal · non-contrast · 0.44mm/px · 2 of 114 slices shown]
[im 38/114  soft-tissue]
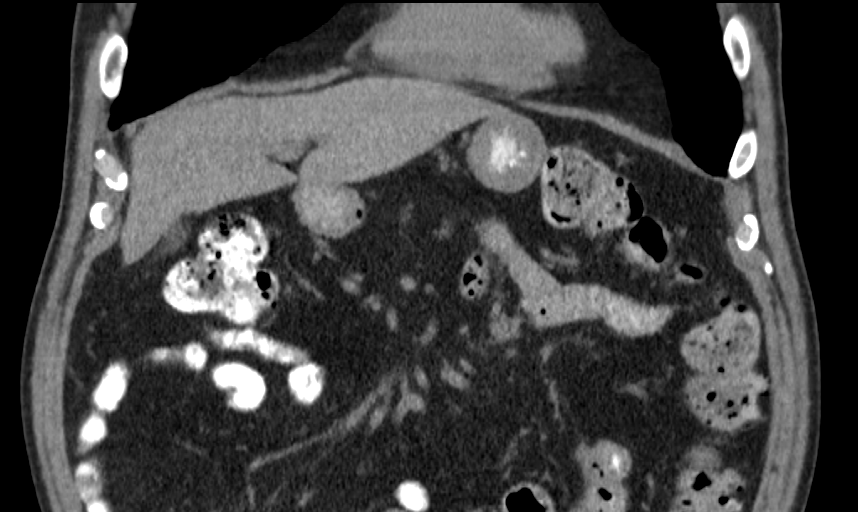
[im 76/114  soft-tissue]
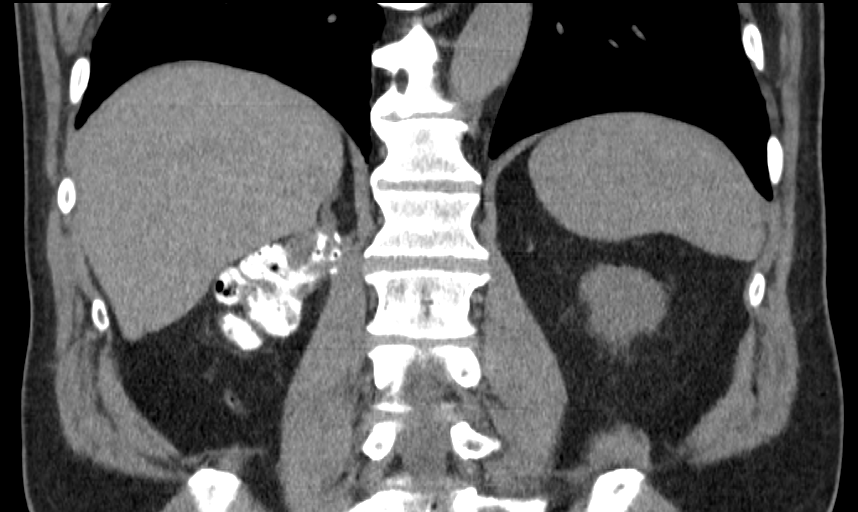

[Series 11: axial nephro · axial · 0.76mm/px · z∈[-580,-97]mm · 8 of 209 slices shown, 13 images]
[im 24/209  soft-tissue]
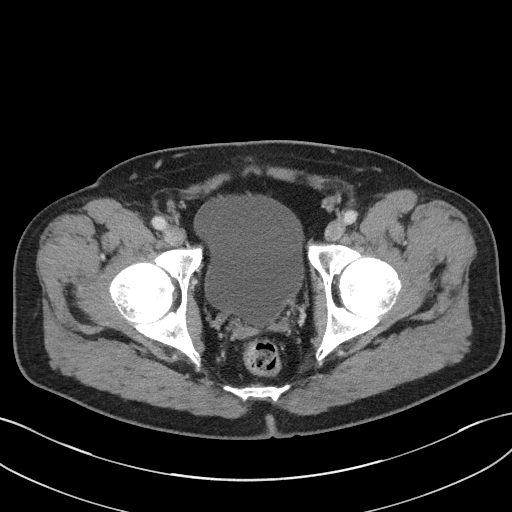
[im 24/209  bone]
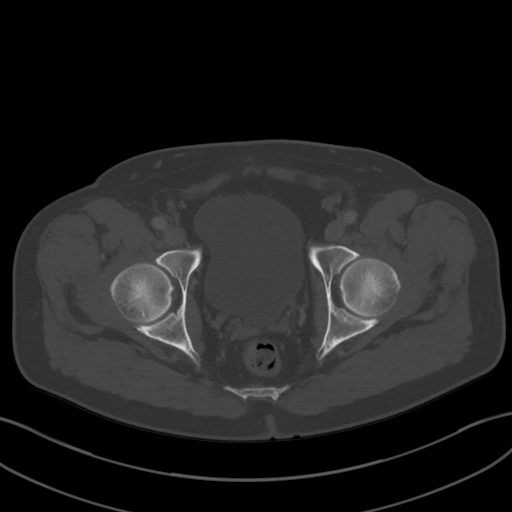
[im 47/209  soft-tissue]
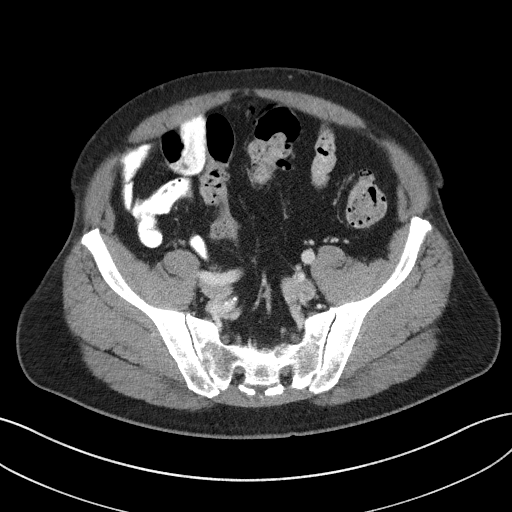
[im 70/209  soft-tissue]
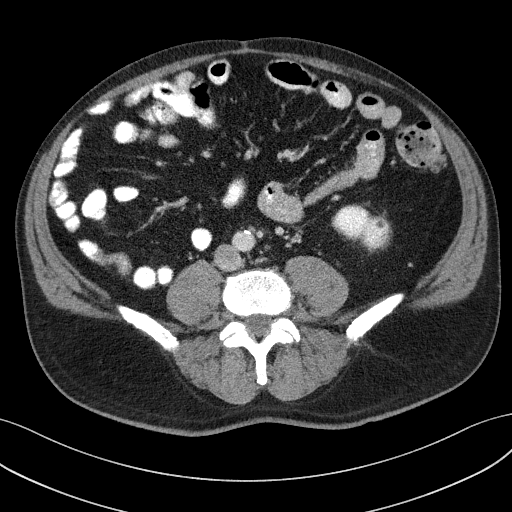
[im 93/209  soft-tissue]
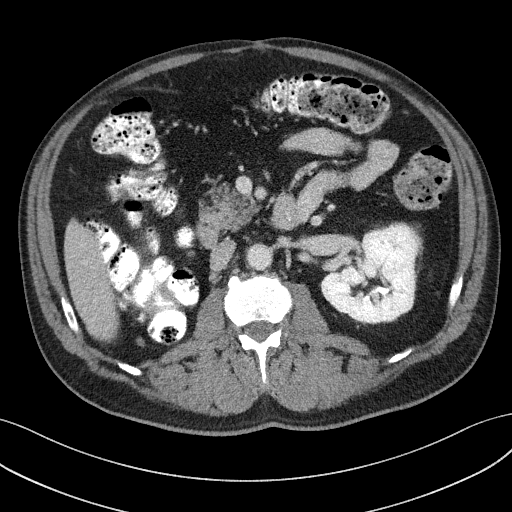
[im 116/209  soft-tissue]
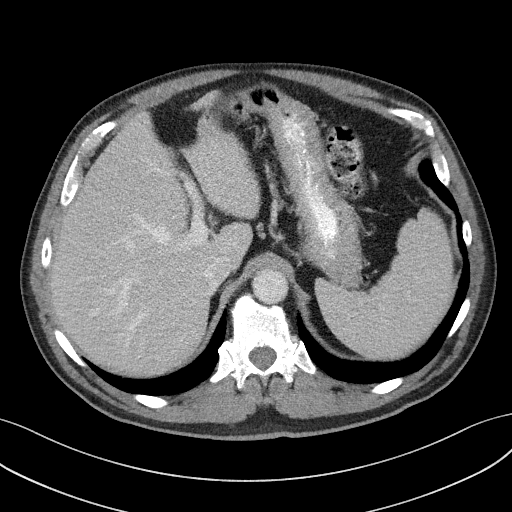
[im 116/209  lung]
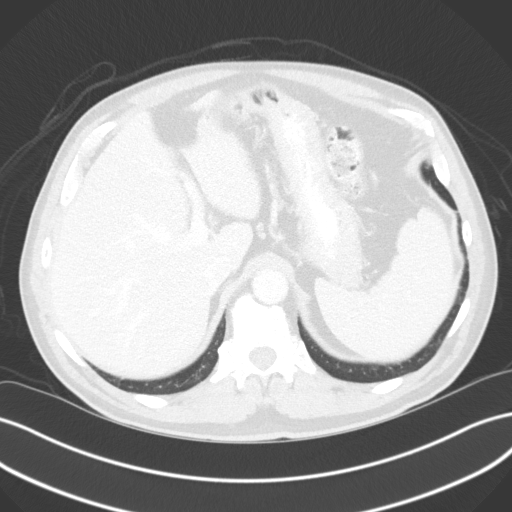
[im 139/209  soft-tissue]
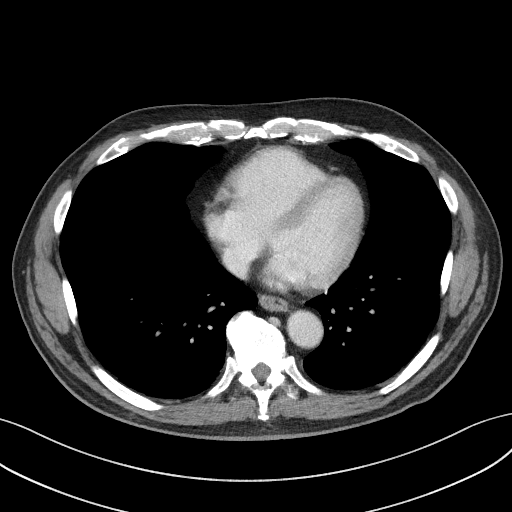
[im 139/209  lung]
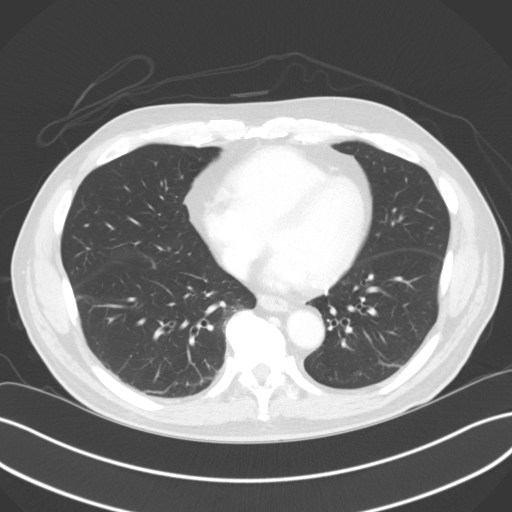
[im 162/209  soft-tissue]
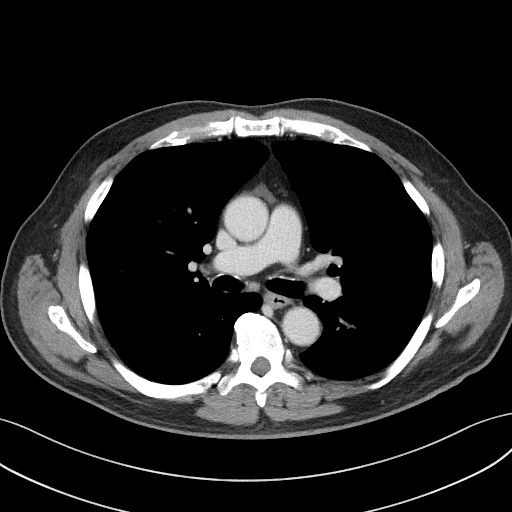
[im 162/209  lung]
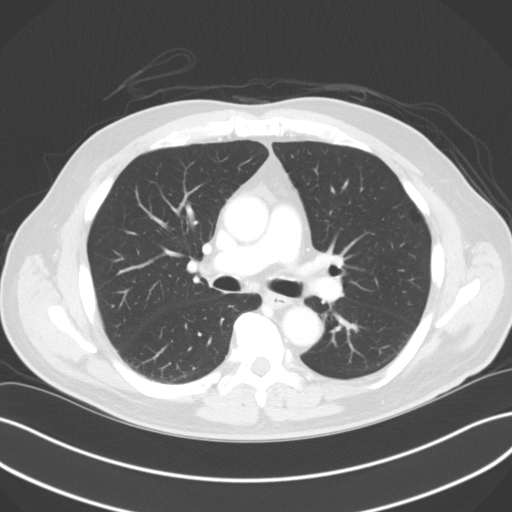
[im 185/209  soft-tissue]
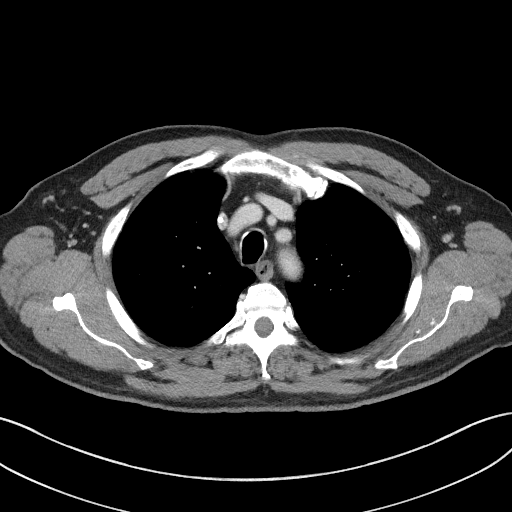
[im 185/209  lung]
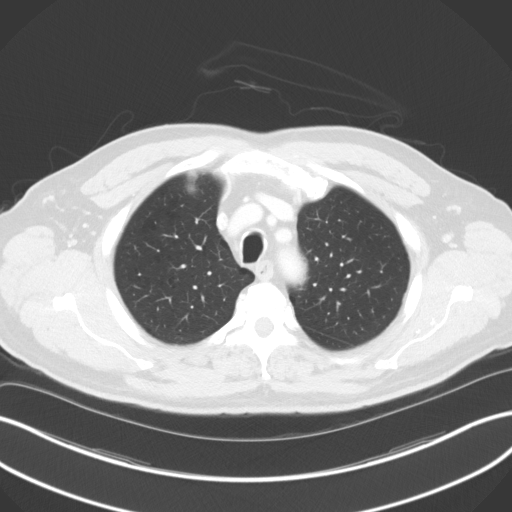

[10 of 46 positions shown; findings below may reference images not displayed]

FINDINGS: CT CHEST FINDINGS

Cardiovascular: The heart is normal in size. No pericardial
effusion. Stable mild tortuosity and calcification of the thoracic
aorta but no focal aneurysm or dissection. The branch vessels are
patent. Stable scattered coronary artery calcifications.

Mediastinum/Nodes: Small scattered mediastinal and hilar lymph nodes
are stable. No mass or adenopathy. The esophagus is grossly normal.

Lungs/Pleura: Stable emphysematous changes. No acute pulmonary
findings or worrisome pulmonary lesions. No pulmonary nodules to
suggest metastatic disease. No pleural effusions or pleural nodules.

Musculoskeletal: No chest wall mass, supraclavicular or axillary
adenopathy. The thyroid gland appears normal. Right-sided
Port-A-Cath in good position without complicating features.

No significant bony findings. No lytic or sclerotic bone lesions to
suggest metastatic disease.

CT ABDOMEN AND PELVIS FINDINGS

Hepatobiliary: No hepatic lesions to suggest metastatic disease. The
gallbladder is normal. No intra or extrahepatic biliary dilatation.
The portal and hepatic veins are patent.

Pancreas: No mass, inflammation or ductal dilatation.

Spleen: Normal size.  No focal lesions.

Adrenals/Urinary Tract: Adrenal glands are normal. The right kidney
is surgically absent. No findings for recurrent tumor in the
nephrectomy bed. Stable small right-sided retroperitoneal nodule
posterior to the IVC and measuring a maximum of 6.5 mm.

The left kidney demonstrates a stable cyst. No worrisome renal
lesions or hydroureteronephrosis. The left ureter is unremarkable.
No bladder mass or calculi.

Stomach/Bowel: The stomach, duodenum, small bowel and colon are
grossly normal without oral contrast. No inflammatory changes, mass
lesions or obstructive findings. The appendix is normal. Advanced
descending colon and sigmoid colon diverticulosis but no findings
for acute diverticulitis. There is a moderate to large amount of
stool throughout the colon which could suggest constipation.

Vascular/Lymphatic: The aorta and branch vessels are patent.
Scattered atherosclerotic calcifications. The major venous
structures are patent. Small scattered mesenteric and
retroperitoneal lymph nodes are stable. No retroperitoneal mass or
overt adenopathy.

Reproductive: Brachytherapy seeds are noted in the prostate gland.
Seminal vesicles are normal.

Other: No pelvic mass or adenopathy. No free pelvic fluid
collections. No inguinal mass or adenopathy. No abdominal wall
hernia or subcutaneous lesions.

Musculoskeletal: No significant bony findings. No lytic or sclerotic
bone lesions to suggest metastatic disease.
IMPRESSION: 1. Status post right nephrectomy. No findings for recurrent tumor,
locoregional adenopathy or metastatic disease.
2. Stable small right-sided retroperitoneal nodule.
3. Stable emphysematous changes. No acute pulmonary findings or
worrisome pulmonary lesions.
4. Moderate to large amount of stool throughout the colon could
suggest constipation.
5. Brachytherapy seeds in the prostate gland but no findings
suspicious for metastatic disease.
6. Emphysema and aortic atherosclerosis.

Aortic Atherosclerosis (MH5YU-II9.9) and Emphysema (MH5YU-LVV.S).

## 2020-12-13 DIAGNOSIS — L821 Other seborrheic keratosis: Secondary | ICD-10-CM | POA: Diagnosis not present

## 2020-12-13 DIAGNOSIS — D225 Melanocytic nevi of trunk: Secondary | ICD-10-CM | POA: Diagnosis not present

## 2020-12-13 DIAGNOSIS — L570 Actinic keratosis: Secondary | ICD-10-CM | POA: Diagnosis not present

## 2020-12-13 DIAGNOSIS — L814 Other melanin hyperpigmentation: Secondary | ICD-10-CM | POA: Diagnosis not present

## 2020-12-23 DIAGNOSIS — H2513 Age-related nuclear cataract, bilateral: Secondary | ICD-10-CM | POA: Diagnosis not present

## 2020-12-23 DIAGNOSIS — H16223 Keratoconjunctivitis sicca, not specified as Sjogren's, bilateral: Secondary | ICD-10-CM | POA: Diagnosis not present

## 2020-12-23 DIAGNOSIS — H35432 Paving stone degeneration of retina, left eye: Secondary | ICD-10-CM | POA: Diagnosis not present

## 2020-12-23 DIAGNOSIS — H353131 Nonexudative age-related macular degeneration, bilateral, early dry stage: Secondary | ICD-10-CM | POA: Diagnosis not present

## 2020-12-31 ENCOUNTER — Encounter (INDEPENDENT_AMBULATORY_CARE_PROVIDER_SITE_OTHER): Payer: Medicare Other | Admitting: Ophthalmology

## 2021-03-21 DIAGNOSIS — L570 Actinic keratosis: Secondary | ICD-10-CM | POA: Diagnosis not present

## 2021-03-24 ENCOUNTER — Encounter (INDEPENDENT_AMBULATORY_CARE_PROVIDER_SITE_OTHER): Payer: Medicare Other | Admitting: Ophthalmology

## 2021-04-17 DIAGNOSIS — Z23 Encounter for immunization: Secondary | ICD-10-CM | POA: Diagnosis not present

## 2021-04-22 DIAGNOSIS — H903 Sensorineural hearing loss, bilateral: Secondary | ICD-10-CM | POA: Diagnosis not present

## 2021-04-28 ENCOUNTER — Telehealth: Payer: Self-pay | Admitting: Oncology

## 2021-04-28 NOTE — Telephone Encounter (Signed)
Called patient regarding upcoming February appointments, patient is notified.  

## 2021-05-02 ENCOUNTER — Inpatient Hospital Stay: Payer: Medicare Other | Attending: Oncology

## 2021-05-02 ENCOUNTER — Other Ambulatory Visit: Payer: Self-pay

## 2021-05-02 ENCOUNTER — Ambulatory Visit (HOSPITAL_COMMUNITY)
Admission: RE | Admit: 2021-05-02 | Discharge: 2021-05-02 | Disposition: A | Discharge status: Home / Self Care | Payer: Medicare Other | Source: Ambulatory Visit | Attending: Oncology | Admitting: Oncology

## 2021-05-02 DIAGNOSIS — K5730 Diverticulosis of large intestine without perforation or abscess without bleeding: Secondary | ICD-10-CM | POA: Diagnosis not present

## 2021-05-02 DIAGNOSIS — E039 Hypothyroidism, unspecified: Secondary | ICD-10-CM | POA: Insufficient documentation

## 2021-05-02 DIAGNOSIS — I2510 Atherosclerotic heart disease of native coronary artery without angina pectoris: Secondary | ICD-10-CM | POA: Insufficient documentation

## 2021-05-02 DIAGNOSIS — N2889 Other specified disorders of kidney and ureter: Secondary | ICD-10-CM | POA: Diagnosis not present

## 2021-05-02 DIAGNOSIS — N281 Cyst of kidney, acquired: Secondary | ICD-10-CM | POA: Diagnosis not present

## 2021-05-02 DIAGNOSIS — J432 Centrilobular emphysema: Secondary | ICD-10-CM | POA: Diagnosis not present

## 2021-05-02 DIAGNOSIS — Z905 Acquired absence of kidney: Secondary | ICD-10-CM | POA: Insufficient documentation

## 2021-05-02 DIAGNOSIS — C649 Malignant neoplasm of unspecified kidney, except renal pelvis: Secondary | ICD-10-CM | POA: Insufficient documentation

## 2021-05-02 DIAGNOSIS — Z79899 Other long term (current) drug therapy: Secondary | ICD-10-CM | POA: Insufficient documentation

## 2021-05-02 DIAGNOSIS — R918 Other nonspecific abnormal finding of lung field: Secondary | ICD-10-CM | POA: Diagnosis not present

## 2021-05-02 DIAGNOSIS — D751 Secondary polycythemia: Secondary | ICD-10-CM | POA: Insufficient documentation

## 2021-05-02 DIAGNOSIS — I700 Atherosclerosis of aorta: Secondary | ICD-10-CM | POA: Diagnosis not present

## 2021-05-02 DIAGNOSIS — Z7982 Long term (current) use of aspirin: Secondary | ICD-10-CM | POA: Insufficient documentation

## 2021-05-02 DIAGNOSIS — I10 Essential (primary) hypertension: Secondary | ICD-10-CM | POA: Insufficient documentation

## 2021-05-02 DIAGNOSIS — Z9221 Personal history of antineoplastic chemotherapy: Secondary | ICD-10-CM | POA: Insufficient documentation

## 2021-05-02 DIAGNOSIS — J439 Emphysema, unspecified: Secondary | ICD-10-CM | POA: Insufficient documentation

## 2021-05-02 DIAGNOSIS — L299 Pruritus, unspecified: Secondary | ICD-10-CM | POA: Insufficient documentation

## 2021-05-02 DIAGNOSIS — Z9079 Acquired absence of other genital organ(s): Secondary | ICD-10-CM | POA: Insufficient documentation

## 2021-05-02 DIAGNOSIS — Z85528 Personal history of other malignant neoplasm of kidney: Secondary | ICD-10-CM | POA: Insufficient documentation

## 2021-05-02 DIAGNOSIS — R911 Solitary pulmonary nodule: Secondary | ICD-10-CM | POA: Diagnosis not present

## 2021-05-02 LAB — CMP (CANCER CENTER ONLY)
ALT: 13 U/L (ref 0–44)
AST: 18 U/L (ref 15–41)
Albumin: 4.4 g/dL (ref 3.5–5.0)
Alkaline Phosphatase: 53 U/L (ref 38–126)
Anion gap: 7 (ref 5–15)
BUN: 22 mg/dL (ref 8–23)
CO2: 29 mmol/L (ref 22–32)
Calcium: 9.6 mg/dL (ref 8.9–10.3)
Chloride: 104 mmol/L (ref 98–111)
Creatinine: 1.6 mg/dL — ABNORMAL HIGH (ref 0.61–1.24)
GFR, Estimated: 45 mL/min — ABNORMAL LOW (ref >60)
Glucose, Bld: 103 mg/dL — ABNORMAL HIGH (ref 70–99)
Potassium: 4.2 mmol/L (ref 3.5–5.1)
Sodium: 140 mmol/L (ref 135–145)
Total Bilirubin: 0.6 mg/dL (ref 0.3–1.2)
Total Protein: 7.3 g/dL (ref 6.5–8.1)

## 2021-05-02 LAB — CBC WITH DIFFERENTIAL (CANCER CENTER ONLY)
Abs Immature Granulocytes: 0.03 10*3/uL (ref 0.00–0.07)
Basophils Absolute: 0.1 10*3/uL (ref 0.0–0.1)
Basophils Relative: 1 %
Eosinophils Absolute: 0.3 10*3/uL (ref 0.0–0.5)
Eosinophils Relative: 5 %
HCT: 54 % — ABNORMAL HIGH (ref 39.0–52.0)
Hemoglobin: 18.1 g/dL — ABNORMAL HIGH (ref 13.0–17.0)
Immature Granulocytes: 1 %
Lymphocytes Relative: 24 %
Lymphs Abs: 1.4 10*3/uL (ref 0.7–4.0)
MCH: 28.6 pg (ref 26.0–34.0)
MCHC: 33.5 g/dL (ref 30.0–36.0)
MCV: 85.4 fL (ref 80.0–100.0)
Monocytes Absolute: 0.7 10*3/uL (ref 0.1–1.0)
Monocytes Relative: 11 %
Neutro Abs: 3.3 10*3/uL (ref 1.7–7.7)
Neutrophils Relative %: 58 %
Platelet Count: 219 10*3/uL (ref 150–400)
RBC: 6.32 MIL/uL — ABNORMAL HIGH (ref 4.22–5.81)
RDW: 13.1 % (ref 11.5–15.5)
WBC Count: 5.8 10*3/uL (ref 4.0–10.5)
nRBC: 0 % (ref 0.0–0.2)

## 2021-05-02 LAB — TSH: TSH: 1.409 u[IU]/mL (ref 0.320–4.118)

## 2021-05-02 MED ORDER — SODIUM CHLORIDE (PF) 0.9 % IJ SOLN
INTRAMUSCULAR | Status: AC
  Filled 2021-05-02: qty 50

## 2021-05-02 MED ORDER — IOHEXOL 300 MG/ML  SOLN
100.0000 mL | Freq: Once | INTRAMUSCULAR | Status: AC | PRN
  Administered 2021-05-02: 100 mL via INTRAVENOUS

## 2021-05-09 ENCOUNTER — Inpatient Hospital Stay (HOSPITAL_BASED_OUTPATIENT_CLINIC_OR_DEPARTMENT_OTHER): Payer: Medicare Other | Admitting: Oncology

## 2021-05-09 ENCOUNTER — Other Ambulatory Visit: Payer: Self-pay

## 2021-05-09 VITALS — BP 151/90 | HR 65 | Temp 98.1°F | Resp 17 | Ht 67.0 in | Wt 197.2 lb

## 2021-05-09 DIAGNOSIS — L299 Pruritus, unspecified: Secondary | ICD-10-CM | POA: Diagnosis not present

## 2021-05-09 DIAGNOSIS — Z9079 Acquired absence of other genital organ(s): Secondary | ICD-10-CM | POA: Diagnosis not present

## 2021-05-09 DIAGNOSIS — Z7982 Long term (current) use of aspirin: Secondary | ICD-10-CM | POA: Diagnosis not present

## 2021-05-09 DIAGNOSIS — I10 Essential (primary) hypertension: Secondary | ICD-10-CM | POA: Diagnosis not present

## 2021-05-09 DIAGNOSIS — Z905 Acquired absence of kidney: Secondary | ICD-10-CM | POA: Diagnosis not present

## 2021-05-09 DIAGNOSIS — C649 Malignant neoplasm of unspecified kidney, except renal pelvis: Secondary | ICD-10-CM | POA: Diagnosis not present

## 2021-05-09 DIAGNOSIS — D751 Secondary polycythemia: Secondary | ICD-10-CM | POA: Diagnosis not present

## 2021-05-09 DIAGNOSIS — Z9221 Personal history of antineoplastic chemotherapy: Secondary | ICD-10-CM | POA: Diagnosis not present

## 2021-05-09 DIAGNOSIS — Z79899 Other long term (current) drug therapy: Secondary | ICD-10-CM | POA: Diagnosis not present

## 2021-05-09 DIAGNOSIS — Z85528 Personal history of other malignant neoplasm of kidney: Secondary | ICD-10-CM | POA: Diagnosis not present

## 2021-05-09 NOTE — Progress Notes (Signed)
Hematology and Oncology Follow Up Visit  [name] [id number] 27-Nov-1945 76 y.o. 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  He presented with localized disease in April 2020.   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   Prior Therapy:   Therapeutic phlebotomy as needed.  He status post radical nephrectomy April 2020.  The final pathology showed clear cell histology invading into the perineural fat indicating stage T3a.  Tumor margins are negative.  He status post orchiectomy completed on 10/29/2018.  He final pathology showed metastatic carcinoma consistent with clear cell histology.   Axitinib 5 mg daily with pembrolizumab 200 mg every 3 weeks started on 01/26/2019.   Therapy discontinued in May 2021 due to patient preference and complete response to therapy.  He completed 11 cycles of therapy.     Current therapy:   Active surveillance and intermittent phlebotomy.  Interim History:  Mr. Minniefield returns today for repeat evaluation.  Since her last visit, he reports no major changes in his health.  He denies any skin rash or pruritus.  He denies any excessive fatigue, tiredness or weakness.  He did not have any headaches or syncope.  His performance status and quality of life remains unchanged.    .               Medications: Reviewed without changes. Current Outpatient Medications  Medication Sig Dispense Refill   aspirin EC 81 MG tablet Take 81 mg by mouth daily. Swallow whole.     busPIRone (BUSPAR) 5 MG tablet Take 5 mg by mouth 2 (two) times daily as needed (for anxiety).     fluticasone (FLONASE) 50 MCG/ACT nasal spray Place 1 spray into both nostrils daily as needed for allergies.      INLYTA 5 MG tablet TAKE 1 TABLET BY MOUTH TWICE DAILY. TAKE 12 HOURS APART WITH  WATER, WITH OR WITHOUT FOOD (Patient not taking: Reported on 01/18/2020) 60 tablet 0   lidocaine-prilocaine (EMLA) cream Apply 1 application topically as needed. (Patient not taking: Reported on 01/18/2020) 30 g 0   LORazepam (ATIVAN) 1 MG tablet Take 1 tablet (1 mg total) by mouth every 8 (eight) hours. (Patient not taking: Reported on 01/18/2020) 30 tablet 0   losartan-hydrochlorothiazide (HYZAAR) 100-25 MG tablet Take 1 tablet by mouth daily.     meloxicam (MOBIC) 15 MG tablet Take 15 mg by mouth as needed for pain. (Patient not taking: Reported on 01/18/2020)     pravastatin (PRAVACHOL) 40 MG tablet Take 40 mg by mouth daily.     prochlorperazine (COMPAZINE) 10 MG tablet Take 1 tablet (10 mg total) by mouth every 6 (six) hours as needed for nausea or vomiting. (Patient not taking: Reported on 01/18/2020) 30 tablet 0   No current facility-administered medications for this visit.     Allergies:  Allergies  Allergen Reactions   Oxycodone Other (See Comments)    Patient stated,"it keeps me wide awake and I can't sleep."   Septra [Sulfamethoxazole-Trimethoprim] Hives and Itching      Physical exam:     Blood pressure (!) 151/90, pulse 65, temperature 98.1 F (36.7 C), temperature source Temporal, resp. rate 17, height 5\' 7"  (1.702 m), weight 197 lb 3.2 oz (89.4 kg), SpO2 98 %.             ECOG: 0   General  appearance: Alert, awake without any distress. Head: Atraumatic without abnormalities Oropharynx: Without any thrush or ulcers. Eyes: No scleral icterus. Lymph nodes: No lymphadenopathy noted in the cervical, supraclavicular, or axillary nodes Heart:regular rate and rhythm, without any murmurs or gallops.   Lung: Clear to auscultation without any rhonchi, wheezes or dullness to percussion. Abdomin: Soft, nontender without any shifting dullness or ascites. Musculoskeletal: No clubbing or cyanosis. Neurological: No motor or sensory deficits. Skin: No rashes or  lesions.                        Lab Results: Lab Results  Component Value Date   WBC 5.8 05/02/2021   HGB 18.1 (H) 05/02/2021   HCT 54.0 (H) 05/02/2021   MCV 85.4 05/02/2021   PLT 219 05/02/2021     Chemistry      Component Value Date/Time   NA 140 05/02/2021 0754   NA 141 01/18/2015 0835   K 4.2 05/02/2021 0754   K 4.1 01/18/2015 0835   CL 104 05/02/2021 0754   CO2 29 05/02/2021 0754   CO2 25 01/18/2015 0835   BUN 22 05/02/2021 0754   BUN 21.5 01/18/2015 0835   CREATININE 1.60 (H) 05/02/2021 0754   CREATININE 1.1 01/18/2015 0835      Component Value Date/Time   CALCIUM 9.6 05/02/2021 0754   CALCIUM 9.8 01/18/2015 0835   ALKPHOS 53 05/02/2021 0754   ALKPHOS 66 01/18/2015 0835   AST 18 05/02/2021 0754   AST 15 01/18/2015 0835   ALT 13 05/02/2021 0754   ALT 16 01/18/2015 0835   BILITOT 0.6 05/02/2021 0754   BILITOT 0.91 01/18/2015 0835      IMPRESSION: 1. Status post right nephrectomy. No evidence of recurrent or metastatic disease in the chest, abdomen, or pelvis. 2. Stable tiny nodule of the superior segment left lower lobe, almost certainly benign and incidental. Attention on follow-up. 3. Prostate brachytherapy. 4. Emphysema. 5. Coronary artery disease.   Impression and Plan:  76 year old man with:    1.  Sage IV clear-cell renal cell carcinoma diagnosed in August 2020.  He achieved a complete response after surgical resection and immunotherapy.   He continues to be on active surveillance without any evidence of relapsed disease.  CT scan obtained on May 02, 2021 was personally reviewed and showed no evidence of disease relapse.  I recommended continued active surveillance and reinstitute therapy only upon relapse.        2.  Hypertension: Blood pressure is mildly elevated but overall under reasonable control.   3.  Secondary polycythemia: His hemoglobin remains elevated and remains asymptomatic.  Hemoglobin is  unchanged.  4.  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  5. Followup: In 6 months for repeat evaluation.  We will repeat imaging studies in 12 months.   30 minutes were dedicated to this visit.  The time was spent on reviewing laboratory data, disease status update and outlining future plan of care review.  Zola Button, MD 2/24/20239:32 AM

## 2021-05-16 ENCOUNTER — Telehealth: Payer: Self-pay | Admitting: Oncology

## 2021-05-16 NOTE — Telephone Encounter (Signed)
Scheduled per 02/24 los, patient has been called and notified. ?

## 2021-08-19 ENCOUNTER — Other Ambulatory Visit: Payer: Self-pay | Admitting: Nurse Practitioner

## 2021-09-10 ENCOUNTER — Telehealth: Payer: Self-pay | Admitting: Oncology

## 2021-09-10 NOTE — Telephone Encounter (Signed)
Called patient regarding upcoming August appointment, patient has been called and notified.  

## 2021-10-14 DIAGNOSIS — R7989 Other specified abnormal findings of blood chemistry: Secondary | ICD-10-CM | POA: Diagnosis not present

## 2021-10-14 DIAGNOSIS — Z8546 Personal history of malignant neoplasm of prostate: Secondary | ICD-10-CM | POA: Diagnosis not present

## 2021-10-14 DIAGNOSIS — R7309 Other abnormal glucose: Secondary | ICD-10-CM | POA: Diagnosis not present

## 2021-10-14 DIAGNOSIS — E785 Hyperlipidemia, unspecified: Secondary | ICD-10-CM | POA: Diagnosis not present

## 2021-10-14 DIAGNOSIS — I10 Essential (primary) hypertension: Secondary | ICD-10-CM | POA: Diagnosis not present

## 2021-10-14 DIAGNOSIS — Z125 Encounter for screening for malignant neoplasm of prostate: Secondary | ICD-10-CM | POA: Diagnosis not present

## 2021-10-20 DIAGNOSIS — K219 Gastro-esophageal reflux disease without esophagitis: Secondary | ICD-10-CM | POA: Diagnosis not present

## 2021-10-20 DIAGNOSIS — E7800 Pure hypercholesterolemia, unspecified: Secondary | ICD-10-CM | POA: Diagnosis not present

## 2021-10-20 DIAGNOSIS — I10 Essential (primary) hypertension: Secondary | ICD-10-CM | POA: Diagnosis not present

## 2021-10-20 DIAGNOSIS — H6120 Impacted cerumen, unspecified ear: Secondary | ICD-10-CM | POA: Diagnosis not present

## 2021-10-20 DIAGNOSIS — I700 Atherosclerosis of aorta: Secondary | ICD-10-CM | POA: Diagnosis not present

## 2021-10-20 DIAGNOSIS — N1832 Chronic kidney disease, stage 3b: Secondary | ICD-10-CM | POA: Diagnosis not present

## 2021-10-20 DIAGNOSIS — D45 Polycythemia vera: Secondary | ICD-10-CM | POA: Diagnosis not present

## 2021-10-20 DIAGNOSIS — J439 Emphysema, unspecified: Secondary | ICD-10-CM | POA: Diagnosis not present

## 2021-10-20 DIAGNOSIS — Z8546 Personal history of malignant neoplasm of prostate: Secondary | ICD-10-CM | POA: Diagnosis not present

## 2021-10-20 DIAGNOSIS — Z0000 Encounter for general adult medical examination without abnormal findings: Secondary | ICD-10-CM | POA: Diagnosis not present

## 2021-10-20 DIAGNOSIS — M5136 Other intervertebral disc degeneration, lumbar region: Secondary | ICD-10-CM | POA: Diagnosis not present

## 2021-11-07 ENCOUNTER — Inpatient Hospital Stay: Payer: Medicare Other | Attending: Oncology

## 2021-11-07 ENCOUNTER — Inpatient Hospital Stay (HOSPITAL_BASED_OUTPATIENT_CLINIC_OR_DEPARTMENT_OTHER): Payer: Medicare Other | Admitting: Oncology

## 2021-11-07 ENCOUNTER — Other Ambulatory Visit: Payer: Self-pay

## 2021-11-07 VITALS — BP 135/84 | HR 64 | Temp 98.1°F | Resp 15 | Ht 67.0 in | Wt 190.2 lb

## 2021-11-07 DIAGNOSIS — E039 Hypothyroidism, unspecified: Secondary | ICD-10-CM

## 2021-11-07 DIAGNOSIS — R918 Other nonspecific abnormal finding of lung field: Secondary | ICD-10-CM

## 2021-11-07 DIAGNOSIS — I10 Essential (primary) hypertension: Secondary | ICD-10-CM | POA: Diagnosis not present

## 2021-11-07 DIAGNOSIS — C649 Malignant neoplasm of unspecified kidney, except renal pelvis: Secondary | ICD-10-CM | POA: Diagnosis not present

## 2021-11-07 DIAGNOSIS — Z79899 Other long term (current) drug therapy: Secondary | ICD-10-CM | POA: Insufficient documentation

## 2021-11-07 DIAGNOSIS — D751 Secondary polycythemia: Secondary | ICD-10-CM | POA: Insufficient documentation

## 2021-11-07 DIAGNOSIS — Z9079 Acquired absence of other genital organ(s): Secondary | ICD-10-CM | POA: Diagnosis not present

## 2021-11-07 DIAGNOSIS — Z905 Acquired absence of kidney: Secondary | ICD-10-CM | POA: Insufficient documentation

## 2021-11-07 DIAGNOSIS — Z85528 Personal history of other malignant neoplasm of kidney: Secondary | ICD-10-CM | POA: Diagnosis not present

## 2021-11-07 DIAGNOSIS — Z7982 Long term (current) use of aspirin: Secondary | ICD-10-CM | POA: Insufficient documentation

## 2021-11-07 DIAGNOSIS — N2889 Other specified disorders of kidney and ureter: Secondary | ICD-10-CM

## 2021-11-07 LAB — CBC WITH DIFFERENTIAL (CANCER CENTER ONLY)
Abs Immature Granulocytes: 0.03 10*3/uL (ref 0.00–0.07)
Basophils Absolute: 0.1 10*3/uL (ref 0.0–0.1)
Basophils Relative: 1 %
Eosinophils Absolute: 0.3 10*3/uL (ref 0.0–0.5)
Eosinophils Relative: 5 %
HCT: 50.3 % (ref 39.0–52.0)
Hemoglobin: 17.7 g/dL — ABNORMAL HIGH (ref 13.0–17.0)
Immature Granulocytes: 1 %
Lymphocytes Relative: 25 %
Lymphs Abs: 1.3 10*3/uL (ref 0.7–4.0)
MCH: 29.4 pg (ref 26.0–34.0)
MCHC: 35.2 g/dL (ref 30.0–36.0)
MCV: 83.6 fL (ref 80.0–100.0)
Monocytes Absolute: 0.6 10*3/uL (ref 0.1–1.0)
Monocytes Relative: 10 %
Neutro Abs: 3 10*3/uL (ref 1.7–7.7)
Neutrophils Relative %: 58 %
Platelet Count: 202 10*3/uL (ref 150–400)
RBC: 6.02 MIL/uL — ABNORMAL HIGH (ref 4.22–5.81)
RDW: 13 % (ref 11.5–15.5)
WBC Count: 5.3 10*3/uL (ref 4.0–10.5)
nRBC: 0 % (ref 0.0–0.2)

## 2021-11-07 LAB — CMP (CANCER CENTER ONLY)
ALT: 15 U/L (ref 0–44)
AST: 19 U/L (ref 15–41)
Albumin: 4.5 g/dL (ref 3.5–5.0)
Alkaline Phosphatase: 59 U/L (ref 38–126)
Anion gap: 5 (ref 5–15)
BUN: 22 mg/dL (ref 8–23)
CO2: 27 mmol/L (ref 22–32)
Calcium: 9.8 mg/dL (ref 8.9–10.3)
Chloride: 106 mmol/L (ref 98–111)
Creatinine: 1.19 mg/dL (ref 0.61–1.24)
GFR, Estimated: >60 mL/min (ref >60)
Glucose, Bld: 106 mg/dL — ABNORMAL HIGH (ref 70–99)
Potassium: 4 mmol/L (ref 3.5–5.1)
Sodium: 138 mmol/L (ref 135–145)
Total Bilirubin: 0.7 mg/dL (ref 0.3–1.2)
Total Protein: 7.1 g/dL (ref 6.5–8.1)

## 2021-11-07 LAB — TSH: TSH: 1.501 u[IU]/mL (ref 0.350–4.500)

## 2021-11-07 NOTE — Progress Notes (Signed)
Hematology and Oncology Follow Up Visit  [name] [id number] 1945-07-28 77 y.o. 11/07/2021 10:17 AM [name], DO   Principle Diagnosis: 76 year old man with kidney cancer diagnosed in April 2020.  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  He presented with localized disease in April 2020.   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   Prior Therapy:   Therapeutic phlebotomy as needed.  He status post radical nephrectomy April 2020.  The final pathology showed clear cell histology invading into the perineural fat indicating stage T3a.  Tumor margins are negative.  He status post orchiectomy completed on 10/29/2018.  He final pathology showed metastatic carcinoma consistent with clear cell histology.   Axitinib 5 mg daily with pembrolizumab 200 mg every 3 weeks started on 01/26/2019.   Therapy discontinued in May 2021 due to patient preference and complete response to therapy.  He completed 11 cycles of therapy.     Current therapy:  Active surveillance and intermittent phlebotomy.  Interim History:  [name] returns today for a follow-up.  Since the last visit, he reports feeling well without any complaints.  He denies any nausea, vomiting or abdominal pain.  He denies any hospitalizations or illnesses.  He denies any skin rashes or lesions.  He denies any worsening pruritus.   .               Medications: Updated on review. Current Outpatient Medications  Medication Sig Dispense Refill   aspirin EC 81 MG tablet Take 81 mg by mouth daily. Swallow whole.     busPIRone (BUSPAR) 5 MG tablet Take 5 mg by mouth 2 (two) times daily as needed (for anxiety).     fluticasone (FLONASE) 50 MCG/ACT nasal spray Place 1 spray into both nostrils daily as needed for allergies.      INLYTA 5 MG tablet TAKE 1 TABLET BY MOUTH TWICE DAILY. TAKE 12 HOURS APART WITH WATER,  WITH OR WITHOUT FOOD (Patient not taking: Reported on 01/18/2020) 60 tablet 0   lidocaine-prilocaine (EMLA) cream Apply 1 application topically as needed. (Patient not taking: Reported on 01/18/2020) 30 g 0   LORazepam (ATIVAN) 1 MG tablet Take 1 tablet (1 mg total) by mouth every 8 (eight) hours. (Patient not taking: Reported on 01/18/2020) 30 tablet 0   losartan-hydrochlorothiazide (HYZAAR) 100-25 MG tablet Take 1 tablet by mouth daily.     meloxicam (MOBIC) 15 MG tablet Take 15 mg by mouth as needed for pain. (Patient not taking: Reported on 01/18/2020)     pravastatin (PRAVACHOL) 40 MG tablet Take 40 mg by mouth daily.     prochlorperazine (COMPAZINE) 10 MG tablet Take 1 tablet (10 mg total) by mouth every 6 (six) hours as needed for nausea or vomiting. (Patient not taking: Reported on 01/18/2020) 30 tablet 0   No current facility-administered medications for this visit.     Allergies:  Allergies  Allergen Reactions   Oxycodone Other (See Comments)    Patient stated,"it keeps me wide awake and I can't sleep."   Septra [Sulfamethoxazole-Trimethoprim] Hives and Itching      Physical exam:  Blood pressure 135/84, pulse 64, temperature 98.1 F (36.7 C), temperature source Temporal, resp. rate 15, height '5\' 7"'$  (1.702 m), weight 190 lb 3.2 oz (86.3 kg), SpO2 98 %.   ECOG: 0   General appearance: Comfortable appearing without any discomfort Head: Normocephalic without any trauma Oropharynx: Mucous membranes are moist and  pink without any thrush or ulcers. Eyes: Pupils are equal and round reactive to light. Lymph nodes: No cervical, supraclavicular, inguinal or axillary lymphadenopathy.   Heart:regular rate and rhythm.  S1 and S2 without leg edema. Lung: Clear without any rhonchi or wheezes.  No dullness to percussion. Abdomin: Soft, nontender, nondistended with good bowel sounds.  No hepatosplenomegaly. Musculoskeletal: No joint deformity or effusion.  Full range of motion  noted. Neurological: No deficits noted on motor, sensory and deep tendon reflex exam. Skin: No petechial rash or dryness.  Appeared moist.                         Lab Results: Lab Results  Component Value Date   WBC 5.8 05/02/2021   HGB 18.1 (H) 05/02/2021   HCT 54.0 (H) 05/02/2021   MCV 85.4 05/02/2021   PLT 219 05/02/2021     Chemistry      Component Value Date/Time   NA 140 05/02/2021 0754   NA 141 01/18/2015 0835   K 4.2 05/02/2021 0754   K 4.1 01/18/2015 0835   CL 104 05/02/2021 0754   CO2 29 05/02/2021 0754   CO2 25 01/18/2015 0835   BUN 22 05/02/2021 0754   BUN 21.5 01/18/2015 0835   CREATININE 1.60 (H) 05/02/2021 0754   CREATININE 1.1 01/18/2015 0835      Component Value Date/Time   CALCIUM 9.6 05/02/2021 0754   CALCIUM 9.8 01/18/2015 0835   ALKPHOS 53 05/02/2021 0754   ALKPHOS 66 01/18/2015 0835   AST 18 05/02/2021 0754   AST 15 01/18/2015 0835   ALT 13 05/02/2021 0754   ALT 16 01/18/2015 0835   BILITOT 0.6 05/02/2021 0754   BILITOT 0.91 01/18/2015 0835        Impression and Plan:  76 year old man with:    1.  Kidney cancer diagnosed in 2020.  He developed Stage IV clear-cell with spermatic cord involvement and remains disease-free at this time.  The natural course of his disease was reviewed and salvage treatment options were discussed.  Imaging studies obtained in February 2023 showed no evidence of metastatic disease and had a complete response to systemic therapy.  I recommended continued active surveillance and reintroducing salvage systemic therapy if he has relapsed disease.  He is agreeable with this plan and I will update his staging scan before the next visit.       2.  Hypertension: His blood pressure is back within normal range.   3.  Secondary polycythemia: Risks and benefits of proceeding with phlebotomy were discussed at this time.  His hemoglobin has had decreasing continues to get closer to normal range.  We  will defer the option of phlebotomy at this time.  4.  Pruritus: Related to immunotherapy and is resolved at this time.  5. Followup: He will return in 6 months for repeat follow-up and imaging studies.   30 minutes were spent on this visit.  The time was dedicated to reviewing laboratory data, disease status update and outlining future plan of care discussion.  Zola Button, MD 8/25/202310:17 AM

## 2021-11-18 DIAGNOSIS — M25561 Pain in right knee: Secondary | ICD-10-CM | POA: Diagnosis not present

## 2021-11-21 DIAGNOSIS — M25561 Pain in right knee: Secondary | ICD-10-CM | POA: Diagnosis not present

## 2021-11-26 DIAGNOSIS — S83241A Other tear of medial meniscus, current injury, right knee, initial encounter: Secondary | ICD-10-CM | POA: Diagnosis not present

## 2021-11-26 DIAGNOSIS — M25561 Pain in right knee: Secondary | ICD-10-CM | POA: Diagnosis not present

## 2021-11-26 DIAGNOSIS — S83281A Other tear of lateral meniscus, current injury, right knee, initial encounter: Secondary | ICD-10-CM | POA: Diagnosis not present

## 2021-12-11 DIAGNOSIS — R300 Dysuria: Secondary | ICD-10-CM | POA: Diagnosis not present

## 2021-12-18 DIAGNOSIS — N3000 Acute cystitis without hematuria: Secondary | ICD-10-CM | POA: Diagnosis not present

## 2021-12-19 DIAGNOSIS — M94261 Chondromalacia, right knee: Secondary | ICD-10-CM | POA: Diagnosis not present

## 2021-12-19 DIAGNOSIS — G8918 Other acute postprocedural pain: Secondary | ICD-10-CM | POA: Diagnosis not present

## 2021-12-19 DIAGNOSIS — S83281A Other tear of lateral meniscus, current injury, right knee, initial encounter: Secondary | ICD-10-CM | POA: Diagnosis not present

## 2021-12-19 DIAGNOSIS — X58XXXA Exposure to other specified factors, initial encounter: Secondary | ICD-10-CM | POA: Diagnosis not present

## 2021-12-19 DIAGNOSIS — Y999 Unspecified external cause status: Secondary | ICD-10-CM | POA: Diagnosis not present

## 2021-12-19 DIAGNOSIS — S83241A Other tear of medial meniscus, current injury, right knee, initial encounter: Secondary | ICD-10-CM | POA: Diagnosis not present

## 2021-12-19 DIAGNOSIS — S83231A Complex tear of medial meniscus, current injury, right knee, initial encounter: Secondary | ICD-10-CM | POA: Diagnosis not present

## 2021-12-25 DIAGNOSIS — S83241D Other tear of medial meniscus, current injury, right knee, subsequent encounter: Secondary | ICD-10-CM | POA: Diagnosis not present

## 2021-12-25 DIAGNOSIS — R262 Difficulty in walking, not elsewhere classified: Secondary | ICD-10-CM | POA: Diagnosis not present

## 2021-12-29 DIAGNOSIS — H35432 Paving stone degeneration of retina, left eye: Secondary | ICD-10-CM | POA: Diagnosis not present

## 2021-12-29 DIAGNOSIS — R262 Difficulty in walking, not elsewhere classified: Secondary | ICD-10-CM | POA: Diagnosis not present

## 2021-12-29 DIAGNOSIS — S83241D Other tear of medial meniscus, current injury, right knee, subsequent encounter: Secondary | ICD-10-CM | POA: Diagnosis not present

## 2021-12-29 DIAGNOSIS — H2513 Age-related nuclear cataract, bilateral: Secondary | ICD-10-CM | POA: Diagnosis not present

## 2021-12-29 DIAGNOSIS — H353131 Nonexudative age-related macular degeneration, bilateral, early dry stage: Secondary | ICD-10-CM | POA: Diagnosis not present

## 2021-12-30 DIAGNOSIS — M25461 Effusion, right knee: Secondary | ICD-10-CM | POA: Diagnosis not present

## 2022-01-01 DIAGNOSIS — S83241D Other tear of medial meniscus, current injury, right knee, subsequent encounter: Secondary | ICD-10-CM | POA: Diagnosis not present

## 2022-01-01 DIAGNOSIS — R262 Difficulty in walking, not elsewhere classified: Secondary | ICD-10-CM | POA: Diagnosis not present

## 2022-01-05 DIAGNOSIS — R262 Difficulty in walking, not elsewhere classified: Secondary | ICD-10-CM | POA: Diagnosis not present

## 2022-01-05 DIAGNOSIS — S83241D Other tear of medial meniscus, current injury, right knee, subsequent encounter: Secondary | ICD-10-CM | POA: Diagnosis not present

## 2022-01-08 DIAGNOSIS — S83241D Other tear of medial meniscus, current injury, right knee, subsequent encounter: Secondary | ICD-10-CM | POA: Diagnosis not present

## 2022-01-08 DIAGNOSIS — R262 Difficulty in walking, not elsewhere classified: Secondary | ICD-10-CM | POA: Diagnosis not present

## 2022-01-08 DIAGNOSIS — M25461 Effusion, right knee: Secondary | ICD-10-CM | POA: Diagnosis not present

## 2022-01-12 DIAGNOSIS — S83241D Other tear of medial meniscus, current injury, right knee, subsequent encounter: Secondary | ICD-10-CM | POA: Diagnosis not present

## 2022-01-12 DIAGNOSIS — R262 Difficulty in walking, not elsewhere classified: Secondary | ICD-10-CM | POA: Diagnosis not present

## 2022-01-19 DIAGNOSIS — C6290 Malignant neoplasm of unspecified testis, unspecified whether descended or undescended: Secondary | ICD-10-CM | POA: Diagnosis not present

## 2022-01-19 DIAGNOSIS — C641 Malignant neoplasm of right kidney, except renal pelvis: Secondary | ICD-10-CM | POA: Diagnosis not present

## 2022-01-19 DIAGNOSIS — C61 Malignant neoplasm of prostate: Secondary | ICD-10-CM | POA: Diagnosis not present

## 2022-01-19 DIAGNOSIS — N281 Cyst of kidney, acquired: Secondary | ICD-10-CM | POA: Diagnosis not present

## 2022-02-09 ENCOUNTER — Encounter (INDEPENDENT_AMBULATORY_CARE_PROVIDER_SITE_OTHER): Payer: Medicare Other | Admitting: Ophthalmology

## 2022-02-10 ENCOUNTER — Encounter (INDEPENDENT_AMBULATORY_CARE_PROVIDER_SITE_OTHER): Payer: Medicare Other | Admitting: Ophthalmology

## 2022-02-10 DIAGNOSIS — H35033 Hypertensive retinopathy, bilateral: Secondary | ICD-10-CM

## 2022-02-10 DIAGNOSIS — H353122 Nonexudative age-related macular degeneration, left eye, intermediate dry stage: Secondary | ICD-10-CM | POA: Diagnosis not present

## 2022-02-10 DIAGNOSIS — I10 Essential (primary) hypertension: Secondary | ICD-10-CM

## 2022-02-10 DIAGNOSIS — H43813 Vitreous degeneration, bilateral: Secondary | ICD-10-CM | POA: Diagnosis not present

## 2022-02-16 DIAGNOSIS — M5410 Radiculopathy, site unspecified: Secondary | ICD-10-CM | POA: Diagnosis not present

## 2022-02-16 DIAGNOSIS — M542 Cervicalgia: Secondary | ICD-10-CM | POA: Diagnosis not present

## 2022-02-16 DIAGNOSIS — M5030 Other cervical disc degeneration, unspecified cervical region: Secondary | ICD-10-CM | POA: Diagnosis not present

## 2022-02-17 ENCOUNTER — Telehealth: Payer: Self-pay | Admitting: Oncology

## 2022-02-17 NOTE — Telephone Encounter (Signed)
Called patient per dr. Alen Blew transition. Patient r/s with new provider and notified.

## 2022-02-23 DIAGNOSIS — M5412 Radiculopathy, cervical region: Secondary | ICD-10-CM | POA: Diagnosis not present

## 2022-03-03 DIAGNOSIS — M5412 Radiculopathy, cervical region: Secondary | ICD-10-CM | POA: Diagnosis not present

## 2022-03-03 DIAGNOSIS — M542 Cervicalgia: Secondary | ICD-10-CM | POA: Diagnosis not present

## 2022-03-11 DIAGNOSIS — M4722 Other spondylosis with radiculopathy, cervical region: Secondary | ICD-10-CM | POA: Diagnosis not present

## 2022-03-11 DIAGNOSIS — Z6828 Body mass index (BMI) 28.0-28.9, adult: Secondary | ICD-10-CM | POA: Diagnosis not present

## 2022-03-19 DIAGNOSIS — M4722 Other spondylosis with radiculopathy, cervical region: Secondary | ICD-10-CM | POA: Diagnosis not present

## 2022-03-19 DIAGNOSIS — M5412 Radiculopathy, cervical region: Secondary | ICD-10-CM | POA: Diagnosis not present

## 2022-04-13 DIAGNOSIS — M4722 Other spondylosis with radiculopathy, cervical region: Secondary | ICD-10-CM | POA: Diagnosis not present

## 2022-04-13 DIAGNOSIS — M5412 Radiculopathy, cervical region: Secondary | ICD-10-CM | POA: Diagnosis not present

## 2022-04-13 DIAGNOSIS — Z6828 Body mass index (BMI) 28.0-28.9, adult: Secondary | ICD-10-CM | POA: Diagnosis not present

## 2022-04-29 DIAGNOSIS — I10 Essential (primary) hypertension: Secondary | ICD-10-CM | POA: Diagnosis not present

## 2022-04-29 DIAGNOSIS — M5030 Other cervical disc degeneration, unspecified cervical region: Secondary | ICD-10-CM | POA: Diagnosis not present

## 2022-05-11 ENCOUNTER — Inpatient Hospital Stay: Payer: Medicare Other | Attending: Internal Medicine

## 2022-05-11 ENCOUNTER — Ambulatory Visit (HOSPITAL_COMMUNITY)
Admission: RE | Admit: 2022-05-11 | Discharge: 2022-05-11 | Disposition: A | Discharge status: Home / Self Care | Payer: Medicare Other | Source: Ambulatory Visit | Attending: Oncology | Admitting: Oncology

## 2022-05-11 ENCOUNTER — Encounter (HOSPITAL_COMMUNITY): Payer: Self-pay

## 2022-05-11 DIAGNOSIS — Z85528 Personal history of other malignant neoplasm of kidney: Secondary | ICD-10-CM | POA: Insufficient documentation

## 2022-05-11 DIAGNOSIS — D751 Secondary polycythemia: Secondary | ICD-10-CM | POA: Insufficient documentation

## 2022-05-11 DIAGNOSIS — R918 Other nonspecific abnormal finding of lung field: Secondary | ICD-10-CM

## 2022-05-11 DIAGNOSIS — K5730 Diverticulosis of large intestine without perforation or abscess without bleeding: Secondary | ICD-10-CM | POA: Diagnosis not present

## 2022-05-11 DIAGNOSIS — N2889 Other specified disorders of kidney and ureter: Secondary | ICD-10-CM | POA: Insufficient documentation

## 2022-05-11 DIAGNOSIS — N289 Disorder of kidney and ureter, unspecified: Secondary | ICD-10-CM | POA: Diagnosis not present

## 2022-05-11 DIAGNOSIS — J432 Centrilobular emphysema: Secondary | ICD-10-CM | POA: Diagnosis not present

## 2022-05-11 DIAGNOSIS — I10 Essential (primary) hypertension: Secondary | ICD-10-CM | POA: Diagnosis not present

## 2022-05-11 DIAGNOSIS — C649 Malignant neoplasm of unspecified kidney, except renal pelvis: Secondary | ICD-10-CM

## 2022-05-11 DIAGNOSIS — R911 Solitary pulmonary nodule: Secondary | ICD-10-CM | POA: Diagnosis not present

## 2022-05-11 DIAGNOSIS — J8410 Pulmonary fibrosis, unspecified: Secondary | ICD-10-CM | POA: Diagnosis not present

## 2022-05-11 DIAGNOSIS — E039 Hypothyroidism, unspecified: Secondary | ICD-10-CM

## 2022-05-11 DIAGNOSIS — Z79899 Other long term (current) drug therapy: Secondary | ICD-10-CM | POA: Diagnosis present

## 2022-05-11 LAB — CBC WITH DIFFERENTIAL (CANCER CENTER ONLY)
Abs Immature Granulocytes: 0.04 10*3/uL (ref 0.00–0.07)
Basophils Absolute: 0 10*3/uL (ref 0.0–0.1)
Basophils Relative: 1 %
Eosinophils Absolute: 0.3 10*3/uL (ref 0.0–0.5)
Eosinophils Relative: 5 %
HCT: 54.2 % — ABNORMAL HIGH (ref 39.0–52.0)
Hemoglobin: 18.6 g/dL — ABNORMAL HIGH (ref 13.0–17.0)
Immature Granulocytes: 1 %
Lymphocytes Relative: 26 %
Lymphs Abs: 1.4 10*3/uL (ref 0.7–4.0)
MCH: 29.2 pg (ref 26.0–34.0)
MCHC: 34.3 g/dL (ref 30.0–36.0)
MCV: 85.1 fL (ref 80.0–100.0)
Monocytes Absolute: 0.5 10*3/uL (ref 0.1–1.0)
Monocytes Relative: 10 %
Neutro Abs: 3 10*3/uL (ref 1.7–7.7)
Neutrophils Relative %: 57 %
Platelet Count: 239 10*3/uL (ref 150–400)
RBC: 6.37 MIL/uL — ABNORMAL HIGH (ref 4.22–5.81)
RDW: 13 % (ref 11.5–15.5)
WBC Count: 5.1 10*3/uL (ref 4.0–10.5)
nRBC: 0 % (ref 0.0–0.2)

## 2022-05-11 LAB — TSH: TSH: 1.445 u[IU]/mL (ref 0.350–4.500)

## 2022-05-11 LAB — CMP (CANCER CENTER ONLY)
ALT: 15 U/L (ref 0–44)
AST: 18 U/L (ref 15–41)
Albumin: 4.4 g/dL (ref 3.5–5.0)
Alkaline Phosphatase: 51 U/L (ref 38–126)
Anion gap: 7 (ref 5–15)
BUN: 21 mg/dL (ref 8–23)
CO2: 29 mmol/L (ref 22–32)
Calcium: 9.4 mg/dL (ref 8.9–10.3)
Chloride: 104 mmol/L (ref 98–111)
Creatinine: 1.33 mg/dL — ABNORMAL HIGH (ref 0.61–1.24)
GFR, Estimated: 56 mL/min — ABNORMAL LOW
Glucose, Bld: 98 mg/dL (ref 70–99)
Potassium: 4.2 mmol/L (ref 3.5–5.1)
Sodium: 140 mmol/L (ref 135–145)
Total Bilirubin: 0.8 mg/dL (ref 0.3–1.2)
Total Protein: 7.4 g/dL (ref 6.5–8.1)

## 2022-05-11 MED ORDER — SODIUM CHLORIDE (PF) 0.9 % IJ SOLN
INTRAMUSCULAR | Status: AC
  Filled 2022-05-11: qty 50

## 2022-05-11 MED ORDER — IOHEXOL 300 MG/ML  SOLN
80.0000 mL | Freq: Once | INTRAMUSCULAR | Status: AC | PRN
  Administered 2022-05-11: 80 mL via INTRAVENOUS

## 2022-05-13 ENCOUNTER — Ambulatory Visit: Payer: Medicare Other | Admitting: Oncology

## 2022-05-13 ENCOUNTER — Telehealth: Payer: Self-pay | Admitting: Internal Medicine

## 2022-05-13 DIAGNOSIS — M542 Cervicalgia: Secondary | ICD-10-CM | POA: Diagnosis not present

## 2022-05-13 DIAGNOSIS — M5412 Radiculopathy, cervical region: Secondary | ICD-10-CM | POA: Diagnosis not present

## 2022-05-13 NOTE — Telephone Encounter (Signed)
Rescheduled 03/05 to 03/18 due to scheduling conflict.Patient has been called and voicemail was left.

## 2022-05-18 ENCOUNTER — Other Ambulatory Visit: Payer: Self-pay

## 2022-05-18 ENCOUNTER — Ambulatory Visit: Payer: Medicare Other | Attending: Neurosurgery

## 2022-05-18 DIAGNOSIS — M436 Torticollis: Secondary | ICD-10-CM | POA: Insufficient documentation

## 2022-05-18 DIAGNOSIS — M5412 Radiculopathy, cervical region: Secondary | ICD-10-CM | POA: Diagnosis not present

## 2022-05-18 DIAGNOSIS — R29898 Other symptoms and signs involving the musculoskeletal system: Secondary | ICD-10-CM | POA: Diagnosis not present

## 2022-05-18 DIAGNOSIS — Z7409 Other reduced mobility: Secondary | ICD-10-CM | POA: Diagnosis not present

## 2022-05-18 NOTE — Therapy (Signed)
OUTPATIENT PHYSICAL THERAPY CERVICAL EVALUATION   Patient Name: [name] MRN: [id number] DOB:[birth year], 77 y.o., male Today's Date: 05/18/2022  END OF SESSION:  PT End of Session - 05/18/22 0758     Visit Number 1    Date for PT Re-Evaluation 08/10/22    PT Start Time 0800    PT Stop Time 0845    PT Time Calculation (min) 45 min    Activity Tolerance Patient tolerated treatment well    Behavior During Therapy [facility] for tasks assessed/performed             Past Medical History:  Diagnosis Date   Anxiety    Arthritis    Diverticulitis    Hypertension    Inguinal hernia    Metastatic renal cell carcinoma ([place]) dx'd 2020   Polycythemia    phlebotomy 06/15/13   Prostate cancer ([place]) dx'd 2008   seed implant   Wears glasses    Wears hearing aid    both ears   Past Surgical History:  Procedure Laterality Date   COLONOSCOPY     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   Procedure: EXCISION LEFT DUPUYTRENS RING AND SMALL FINGERS;  Surgeon: Cammie Sickle., MD;  Location: [place];  Service: Orthopedics;  Laterality: Left;   ESOPHAGOGASTRODUODENOSCOPY (EGD) WITH PROPOFOL N/A 05/12/2018   Procedure: ESOPHAGOGASTRODUODENOSCOPY (EGD) WITH PROPOFOL;  Surgeon: Laurence Spates, MD;  [facility];  Service: Endoscopy;  Laterality: N/A;   HERNIA REPAIR  2010   lt ing with lt hydrocele   IR IMAGING GUIDED PORT INSERTION  01/20/2019   IR REMOVAL TUN ACCESS W/ PORT W/O FL MOD SED  11/21/2019   ORCHIECTOMY Right 10/29/2018   Procedure: ORCHIECTOMY radical;  Surgeon: Alexis Frock, MD;  [facility];  Service: Urology;  Laterality: Right;  [address] [place] IMPLANT  2006   hx of prostate   ROBOT ASSISTED LAPAROSCOPIC NEPHRECTOMY Right 06/22/2018   Procedure: XI ROBOTIC ASSISTED LAPAROSCOPIC NEPHRECTOMY;  Surgeon: Alexis Frock, MD;  [facility];  Service: Urology;  Laterality: Right;  3 HRS   SHOULDER ARTHROSCOPY  11/13   right    Patient Active Problem List   Diagnosis Date Noted   Pruritus 01/18/2020   Primary malignant neoplasm of kidney with metastasis from kidney to other site [facility]) 01/13/2019   Goals of care, counseling/discussion 01/13/2019   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   Renal mass 06/22/2018   Polycythemia, secondary 09/01/2013    PCP: Janie Morning  REFERRING PROVIDER:Nundkumar, Barkley Boards DIAG: 03/19/22  THERAPY DIAG:  Radiculopathy, cervical region - Plan: PT plan of care cert/re-cert  Stiffness of cervical spine - Plan: PT plan of care cert/re-cert  Right arm weakness - Plan: PT plan of care cert/re-cert  Impaired functional mobility and activity tolerance - Plan: PT plan of care cert/re-cert  Rationale for Evaluation and Treatment: Rehabilitation  ONSET DATE: 03/19/22  SUBJECTIVE:  SUBJECTIVE STATEMENT: Pain R post arm points to triceps, and medial scapular border steroids really helped, since off them and after surgery pain is severe.  Can take Goody powders but aren't really supposed to take them due to only one kidney.  Pain increases with any R cervical side bending  PERTINENT HISTORY:  C5/6 fusion, and " something at c 7 9 weeks ago  PAIN:  Are you having pain? Yes: NPRS scale: 8/10 Pain location: R medial scapular border, R post upper arm Pain description: deep , penetrating, sharp pain Aggravating factors: cervical spine movement Relieving factors: any anti inflammatory meds  PRECAUTIONS: Cervical  WEIGHT BEARING RESTRICTIONS: No  FALLS:  Has patient fallen in last 6 months? No  LIVING ENVIRONMENT: Lives with: lives with their family and lives with their spouse Lives in: House/apartment Stairs: Yes: External: 4 steps; can reach both Has following equipment at  home: None  OCCUPATION: retired  PLOF: Independent  PATIENT GOALS: get some relief from this pain  NEXT MD VISIT: unknown  OBJECTIVE:   DIAGNOSTIC FINDINGS:  None found  PATIENT SURVEYS:  NDI 60  COGNITION: Overall cognitive status: Within functional limits for tasks assessed  SENSATION: WFL  POSTURE:  Maintains neck in position of slight L lateral flexion to 10 degrees  PALPATION: Tender R posterior cervical paraspinals C 3, 4, 5, R upper traps, R coracoid process   CERVICAL ROM:   Active ROM A/PROM (deg) eval  Flexion wfl  Extension 10  Right lateral flexion 0  Left lateral flexion 25  Right rotation 25  Left rotation 25   (Blank rows = not tested)  UPPER EXTREMITY ROM: full AROM vs gravity B UE's  UPPER EXTREMITY MMT:  MMT not specifically assessed today, due to pain/ irritability  CERVICAL SPECIAL TESTS:  Upper limb tension test (ULTT): Positive for ULTT B radial nerve Spurling's +  TODAY'S TREATMENT:                                                                                                                              DATE: 05/18/22: Manual: Dry needling:  2 sites, R upper traps, R multifidus C 7.  Marked muscle twitch R upper traps  Interferential current posterior c -spine, 4 pads, crossed current, x 10 min for pain management  Instructed in isometric cervical spine for L cervical rotation, and cervical forward flexion, for pain management, provided written instructions  PATIENT EDUCATION:  Education details: POC, goals Person educated: Patient Education method: Explanation, Demonstration, and Tactile cues Education comprehension: verbalized understanding  HOME EXERCISE PROGRAM: Access Code: [email or web address] URL: [email or web address] Date: 05/18/2022 Prepared by: Mallie Linnemann  Exercises - Standing Isometric Cervical Flexion with Manual Resistance  - 1 x daily - 7 x weekly - 3 sets - 10 reps - Seated Isometric Cervical Rotation  -  1 x daily - 7 x weekly - 3 sets - 10 reps  ASSESSMENT:  CLINICAL IMPRESSION: Patient is a 77 y.o.  male who was seen today for physical therapy evaluation and treatment for R cervical spine radiculopathy.  His specific radiographs and surgical information is not available today. He is right at 8 weeks post op.  His pain is consistent with C 7 radiculopathy. Very provocative with cervical r side bending motion.  His surgeon specifically requested dry needling today on the prescription.  Also utilized IFC to allow the patient to assess the feasibility of TENs unit . Should also benefit from neural glides R UE with radial nerve emphasis and progression with additional cervical isometrics. Does need more thorough eval of strength R UE and to address those deficits more specifically.  OBJECTIVE IMPAIRMENTS: decreased activity tolerance, decreased ROM, decreased strength, hypomobility, impaired perceived functional ability, impaired flexibility, and pain.   ACTIVITY LIMITATIONS: carrying, lifting, sleeping, bed mobility, dressing, and reach over head  PARTICIPATION LIMITATIONS: meal prep, driving, community activity, and yard work  PERSONAL FACTORS: 1 comorbidity: hyperlipidemia  are also affecting patient's functional outcome.   REHAB POTENTIAL: Good  CLINICAL DECISION MAKING: Stable/uncomplicated  EVALUATION COMPLEXITY: Low   GOALS: Goals reviewed with patient? Yes  SHORT TERM GOALS: Target date: 06/01/22  I HEP Baseline: initiated today 05/18/22 Goal status: INITIAL    LONG TERM GOALS: Target date: 08/10/22  Improve radicular pain, improve cervical spine R side bending motion to 10 degrees without radicular Sx to allow for better tolerance for sleeping,field of vision, ADL's Baseline: 0 degrees R cervical side bending with UE Sx provocation Goal status: INITIAL  2.  NDI improve from 60 % deficit to 40 % or better Baseline: 60% Goal status: INITIAL  3.  Improve R UE strength for  grasp , shoulder MMT to within 80% of L UE, will test when less provocative Sx Baseline: patient states weak grasp and wrist extension R although not formally tested today Goal status: INITIAL   PLAN:  PT FREQUENCY: 1-2x/week  PT DURATION: 12 weeks  PLANNED INTERVENTIONS: Therapeutic exercises, Therapeutic activity, Neuromuscular re-education, Balance training, Gait training, Patient/Family education, Self Care, Joint mobilization, and Dry Needling  PLAN FOR NEXT SESSION: Assess how he responded to today's dry needling and isometrics, e stim. Progress with nerve glides   Datron Brakebill L Cay Kath, PT 05/18/2022, 5:03 PM

## 2022-05-19 ENCOUNTER — Ambulatory Visit: Payer: Medicare Other | Admitting: Hematology and Oncology

## 2022-05-19 ENCOUNTER — Ambulatory Visit: Payer: Medicare Other | Admitting: Oncology

## 2022-05-19 ENCOUNTER — Ambulatory Visit: Payer: Medicare Other | Admitting: Internal Medicine

## 2022-05-25 ENCOUNTER — Ambulatory Visit: Payer: Medicare Other

## 2022-05-25 ENCOUNTER — Other Ambulatory Visit: Payer: Self-pay

## 2022-05-25 DIAGNOSIS — M436 Torticollis: Secondary | ICD-10-CM | POA: Diagnosis not present

## 2022-05-25 DIAGNOSIS — Z7409 Other reduced mobility: Secondary | ICD-10-CM

## 2022-05-25 DIAGNOSIS — M5412 Radiculopathy, cervical region: Secondary | ICD-10-CM

## 2022-05-25 DIAGNOSIS — R29898 Other symptoms and signs involving the musculoskeletal system: Secondary | ICD-10-CM | POA: Diagnosis not present

## 2022-05-25 NOTE — Therapy (Signed)
OUTPATIENT PHYSICAL THERAPY CERVICAL EVALUATION   Patient Name: [name] MRN: [id number] DOB:08-May-1945, 77 y.o., male Today's Date: 05/25/2022  END OF SESSION:  PT End of Session - 05/25/22 1003     Visit Number 2    Date for PT Re-Evaluation 08/10/22    PT Start Time 0850    PT Stop Time 0930    PT Time Calculation (min) 40 min             Past Medical History:  Diagnosis Date   Anxiety    Arthritis    Diverticulitis    Hypertension    Inguinal hernia    Metastatic renal cell carcinoma ([place]) dx'd 2020   Polycythemia    phlebotomy 06/15/13   Prostate cancer ([place]) dx'd 2008   seed implant   Wears glasses    Wears hearing aid    both ears   Past Surgical History:  Procedure Laterality Date   COLONOSCOPY     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   Procedure: EXCISION LEFT DUPUYTRENS RING AND SMALL FINGERS;  Surgeon: Cammie Sickle., MD;  Location: [place];  Service: Orthopedics;  Laterality: Left;   ESOPHAGOGASTRODUODENOSCOPY (EGD) WITH PROPOFOL N/A 05/12/2018   Procedure: ESOPHAGOGASTRODUODENOSCOPY (EGD) WITH PROPOFOL;  Surgeon: Laurence Spates, MD;  [facility];  Service: Endoscopy;  Laterality: N/A;   HERNIA REPAIR  2010   lt ing with lt hydrocele   IR IMAGING GUIDED PORT INSERTION  01/20/2019   IR REMOVAL TUN ACCESS W/ PORT W/O FL MOD SED  11/21/2019   ORCHIECTOMY Right 10/29/2018   Procedure: ORCHIECTOMY radical;  Surgeon: Alexis Frock, MD;  [facility];  Service: Urology;  Laterality: Right;  [address] [place] IMPLANT  2006   hx of prostate   ROBOT ASSISTED LAPAROSCOPIC NEPHRECTOMY Right 06/22/2018   Procedure: XI ROBOTIC ASSISTED LAPAROSCOPIC NEPHRECTOMY;  Surgeon: Alexis Frock, MD;  [facility];  Service: Urology;  Laterality: Right;  3 HRS   SHOULDER ARTHROSCOPY  11/13   right   Patient Active Problem List   Diagnosis Date Noted   Pruritus 01/18/2020   Primary malignant neoplasm of kidney  with metastasis from kidney to other site [facility]) 01/13/2019   Goals of care, counseling/discussion 01/13/2019   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  Seemed like initial visit I could sleep better, but now I'm right back where I was last week.  I can't take much more of this, having to take too many goody powders  PERTINENT HISTORY:  C5/6 fusion, and " something at c 7 9 weeks ago  PAIN:  Are you having pain? Yes: NPRS scale: 8/10 Pain location: R medial scapular border, R post upper arm Pain description: deep , penetrating, sharp pain Aggravating factors: cervical spine movement Relieving factors: any anti inflammatory meds  PRECAUTIONS: Cervical  WEIGHT BEARING RESTRICTIONS: No  FALLS:  Has patient fallen in last 6 months? No  LIVING ENVIRONMENT: Lives with: lives with their family and lives with their spouse Lives in: House/apartment Stairs: Yes: External: 4 steps; can reach both Has following equipment at home: None  OCCUPATION: retired  PLOF: Independent  PATIENT GOALS: get some relief from this pain  NEXT MD VISIT: unknown  OBJECTIVE:   DIAGNOSTIC FINDINGS:  None found  PATIENT SURVEYS:  NDI 60  COGNITION: Overall cognitive status: Within functional limits for tasks assessed  SENSATION: WFL  POSTURE:  Maintains neck in  position of slight L lateral flexion to 10 degrees  PALPATION: Tender R posterior cervical paraspinals C 3, 4, 5, R upper traps, R coracoid process   CERVICAL ROM:   Active ROM A/PROM (deg) eval  Flexion wfl  Extension 10  Right lateral flexion 0  Left lateral flexion 25  Right rotation 25  Left rotation 25   (Blank rows = not tested)  UPPER EXTREMITY ROM: full AROM vs gravity B UE's  UPPER EXTREMITY MMT:  MMT not specifically assessed today, due to pain/ irritability  CERVICAL SPECIAL TESTS:  Upper limb tension test (ULTT): Positive for ULTT B radial nerve Spurling's +  TODAY'S TREATMENT:                                                                                                                              DATE:  05/25/22:  Initially positioned patient in supine, head of treatment table elevated, for electrical stimulation, IFC, x 15 min total, with ice upper back.  Crossed the e stim from cervical paraspinals to B rhomboids, 4 pads  Manual:  deep, pressure, palpation along R upper traps, rhomboids, levator scapulae.  Dry needling with 60 mm needle, R rhomboid and R levator scapula over 2 points each with twitch response.  Verbally instructed in isometric B shoulder ER with arms by trunk 10 sec holds, printed and copied to my chart.  Did not have patient perform prior to leaving due to very good response to the other techniques.   05/18/22: Manual: Dry needling:  2 sites, R upper traps, R multifidus C 7.  Marked muscle twitch R upper traps  Interferential current posterior c -spine, 4 pads, crossed current, x 10 min for pain management  Instructed in isometric cervical spine for L cervical rotation, and cervical forward  flexion, for pain management, provided written instructions  PATIENT EDUCATION:  Education details: POC, goals Person educated: Patient Education method: Explanation, Demonstration, and Tactile cues Education comprehension: verbalized  understanding  HOME EXERCISE PROGRAM: Access Code: [email or web address] URL: [email or web address] Date: 05/25/2022 Prepared by: Danuel Felicetti  Exercises - Shoulder External Rotation and Scapular Retraction with Resistance  - 1 x daily - 7 x weekly - 1 sets - 10 reps - 10 sec hold Access Code: [email or web address] URL: [email or web address] Date: 05/18/2022 Prepared by: Weston Fulco  Exercises - Standing Isometric Cervical Flexion with Manual Resistance  - 1 x daily - 7 x weekly - 3 sets - 10 reps - Seated Isometric Cervical Rotation  - 1 x daily - 7 x weekly - 3 sets - 10 reps  ASSESSMENT:  CLINICAL IMPRESSION: Patient is a 77 y.o. male who was seen today for physical therapy treatment for R cervical spine radiculopathy.  Today initially was demonstrating very painful positioning of cervical spine with had in slight R side bending, also facial grimacing with head nods.  Marked improvement with cervical spine positioning and movement at end of session .  He is to see his surgeon this Thursday, to discuss possible injection.  Will continue to address his highly irritable neural pain and progress with appropriate therex as indicated. OBJECTIVE IMPAIRMENTS: decreased activity tolerance, decreased ROM, decreased strength, hypomobility, impaired perceived functional ability, impaired flexibility, and pain.   ACTIVITY LIMITATIONS: carrying, lifting, sleeping, bed mobility, dressing, and reach over head  PARTICIPATION LIMITATIONS: meal prep, driving, community activity, and yard work  PERSONAL FACTORS: 1 comorbidity: hyperlipidemia  are also affecting patient's functional outcome.   REHAB POTENTIAL: Good  CLINICAL DECISION MAKING: Stable/uncomplicated  EVALUATION COMPLEXITY: Low   GOALS: Goals reviewed with patient? Yes  SHORT TERM GOALS: Target date: 06/01/22  I HEP Baseline: initiated today 05/18/22 Goal status: INITIAL    LONG TERM GOALS: Target date: 08/10/22  Improve  radicular pain, improve cervical spine R side bending motion to 10 degrees without radicular Sx to allow for better tolerance for sleeping,field of vision, ADL's Baseline: 0 degrees R cervical side bending with UE Sx provocation Goal status: INITIAL  2.  NDI improve from 60 % deficit to 40 % or better Baseline: 60% Goal status: INITIAL  3.  Improve R UE strength for grasp , shoulder MMT to within 80% of L UE, will test when less provocative Sx Baseline: patient states weak grasp and wrist extension R although not formally tested today Goal status: INITIAL   PLAN:  PT FREQUENCY: 1-2x/week  PT DURATION: 12 weeks  PLANNED INTERVENTIONS: Therapeutic exercises, Therapeutic activity, Neuromuscular re-education, Balance training, Gait training, Patient/Family education, Self Care, Joint mobilization, and Dry Needling  PLAN FOR NEXT SESSION: Assess how he responded to today's dry needling and isometrics, e stim. Progress with nerve glides   Devonne Kitchen L Edman Lipsey, PT 05/25/2022, 12:36 PM

## 2022-05-29 DIAGNOSIS — M5412 Radiculopathy, cervical region: Secondary | ICD-10-CM | POA: Diagnosis not present

## 2022-06-01 ENCOUNTER — Ambulatory Visit: Payer: Medicare Other | Admitting: Physical Therapy

## 2022-06-01 ENCOUNTER — Inpatient Hospital Stay: Payer: Medicare Other | Attending: Internal Medicine | Admitting: Internal Medicine

## 2022-06-01 ENCOUNTER — Inpatient Hospital Stay: Payer: Medicare Other

## 2022-06-01 VITALS — BP 159/95 | HR 59 | Temp 97.9°F | Resp 18 | Wt 201.1 lb

## 2022-06-01 DIAGNOSIS — D751 Secondary polycythemia: Secondary | ICD-10-CM | POA: Diagnosis not present

## 2022-06-01 DIAGNOSIS — Z905 Acquired absence of kidney: Secondary | ICD-10-CM | POA: Diagnosis not present

## 2022-06-01 DIAGNOSIS — C641 Malignant neoplasm of right kidney, except renal pelvis: Secondary | ICD-10-CM

## 2022-06-01 DIAGNOSIS — Z79899 Other long term (current) drug therapy: Secondary | ICD-10-CM | POA: Diagnosis present

## 2022-06-01 DIAGNOSIS — Z85528 Personal history of other malignant neoplasm of kidney: Secondary | ICD-10-CM | POA: Diagnosis not present

## 2022-06-01 NOTE — Progress Notes (Signed)
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 2) reactive polycythemia diagnosed in 2010.  PRIOR THERAPY: 1) status post radical nephrectomy in April 2020 and the final pathology showed clear-cell histology invading into the perineural fat indicating stage IIIa with negative tumor margin. 2) status post orchiectomy completed October 29, 2018 and the final pathology showed metastatic carcinoma consistent with clear-cell histology. 3) status post treatment with Keytruda 200 Mg IV every 3 weeks in addition to axitinib 5 mg p.o. daily started January 26, 2019.  Treatment discontinued in May 2021 based on the patient's preference and complete response.  Status post 11 cycles.  CURRENT THERAPY: Observation and intermittent phlebotomy for the polycythemia.  INTERVAL HISTORY: [name] 77 y.o. male came to the clinic today to establish care with me after his primary oncologist Dr. Alen Blew left the practice.  The patient is feeling fine today with no concerning complaints except for chronic back pain and he underwent surgery recently by Dr. Kathyrn Sheriff.  He continues to have pain and he received steroid injection recently.  He denied having any current chest pain, shortness of breath, cough or hemoptysis.  He has no nausea, vomiting, diarrhea or constipation.  He has no headache or visual changes.  He is here today for evaluation with repeat blood work as well as CT scan of the chest, abdomen and pelvis for restaging of his disease. Family history significant for father with heart disease and died at age 41 and mother was healthy and died at age 6 from old age. The patient is  married and has 1 son.  He used to work as Research officer, political party.  He has no history of alcohol, smoking or drug abuse.  MEDICAL HISTORY: Past Medical History:  Diagnosis Date   Anxiety    Arthritis    Diverticulitis    Hypertension    Inguinal hernia    Metastatic renal cell carcinoma ([place]) dx'd 2020   Polycythemia    phlebotomy 06/15/13   Prostate cancer ([place]) dx'd 2008   seed implant   Wears glasses    Wears hearing aid    both ears    ALLERGIES:  is allergic to oxycodone and septra [sulfamethoxazole-trimethoprim].  MEDICATIONS:  Current Outpatient Medications  Medication Sig Dispense Refill   aspirin EC 81 MG tablet Take 81 mg by mouth daily. Swallow whole.     busPIRone (BUSPAR) 5 MG tablet Take 5 mg by mouth 2 (two) times daily as needed (for anxiety).     fluticasone (FLONASE) 50 MCG/ACT nasal spray Place 1 spray into both nostrils daily as needed for allergies.      INLYTA 5 MG tablet TAKE 1 TABLET BY MOUTH TWICE DAILY. TAKE 12 HOURS APART WITH WATER, WITH OR WITHOUT FOOD (Patient not taking: Reported on 01/18/2020) 60 tablet 0   lidocaine-prilocaine (EMLA) cream Apply 1 application topically as needed. (Patient not taking: Reported on 01/18/2020) 30 g 0   LORazepam (ATIVAN) 1 MG tablet Take 1 tablet (1 mg total) by mouth every 8 (eight) hours. (Patient not taking: Reported on 01/18/2020) 30 tablet 0   losartan-hydrochlorothiazide (HYZAAR) 100-25 MG tablet Take 1 tablet by mouth daily.     meloxicam (MOBIC) 15  MG tablet Take 15 mg by mouth as needed for pain. (Patient not taking: Reported on 01/18/2020)     pravastatin (PRAVACHOL) 40 MG tablet Take 40 mg by mouth daily.     prochlorperazine (COMPAZINE) 10 MG tablet Take 1 tablet (10 mg total) by mouth every 6 (six) hours as needed for nausea or vomiting. (Patient not taking: Reported on 01/18/2020) 30 tablet 0   No current facility-administered medications for this visit.    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   Procedure: EXCISION LEFT DUPUYTRENS RING AND SMALL FINGERS;  Surgeon: Cammie Sickle., MD;  Location: [facility];  Service: Orthopedics;  Laterality: Left;   ESOPHAGOGASTRODUODENOSCOPY (EGD) WITH PROPOFOL N/A 05/12/2018   Procedure: ESOPHAGOGASTRODUODENOSCOPY (EGD) WITH PROPOFOL;  Surgeon: Laurence Spates, MD;  Location: [facility];  Service: Endoscopy;  Laterality: N/A;   HERNIA REPAIR  2010   lt ing with lt hydrocele   IR IMAGING GUIDED PORT INSERTION  01/20/2019   IR REMOVAL TUN ACCESS W/ PORT W/O FL MOD SED  11/21/2019   ORCHIECTOMY Right 10/29/2018   Procedure: ORCHIECTOMY radical;  Surgeon: Alexis Frock, MD;  Location: [facility];  Service: Urology;  Laterality: Right;  8 [place] IMPLANT  2006   hx of prostate   ROBOT ASSISTED LAPAROSCOPIC NEPHRECTOMY Right 06/22/2018   Procedure: XI ROBOTIC ASSISTED LAPAROSCOPIC NEPHRECTOMY;  Surgeon: Alexis Frock, MD;  Location: [facility];  Service: Urology;  Laterality: Right;  3 HRS   SHOULDER ARTHROSCOPY  11/13   right    REVIEW OF SYSTEMS:  Constitutional: positive for fatigue Eyes: negative Ears, nose, mouth, throat, and face: negative Respiratory: negative Cardiovascular: negative Gastrointestinal: negative Genitourinary:negative Integument/breast: negative Hematologic/lymphatic: negative Musculoskeletal:positive for back pain Neurological: negative Behavioral/Psych: negative Endocrine: negative Allergic/Immunologic: negative   PHYSICAL EXAMINATION: General appearance: alert, cooperative, fatigued, and no distress Head: Normocephalic, without obvious abnormality, atraumatic Neck: no adenopathy, no JVD, supple, symmetrical, trachea midline, and thyroid not enlarged, symmetric, no tenderness/mass/nodules Lymph nodes: Cervical, supraclavicular, and axillary nodes normal. Resp: clear to auscultation bilaterally Back:  symmetric, no curvature. ROM normal. No CVA tenderness. Cardio: regular rate and rhythm, S1, S2 normal, no murmur, click, rub or gallop GI: soft, non-tender; bowel sounds normal; no masses,  no organomegaly Extremities: extremities normal, atraumatic, no cyanosis or edema Neurologic: Alert and oriented X 3, normal strength and tone. Normal symmetric reflexes. Normal coordination and gait  ECOG PERFORMANCE STATUS: 1 - Symptomatic but completely ambulatory  Blood pressure (!) 159/95, pulse (!) 59, temperature 97.9 F (36.6 C), temperature source Oral, resp. rate 18, weight 201 lb 1 oz (91.2 kg), SpO2 100 %.  LABORATORY DATA: Lab Results  Component Value Date   WBC 5.1 05/11/2022   HGB 18.6 (H) 05/11/2022   HCT 54.2 (H) 05/11/2022   MCV 85.1 05/11/2022   PLT 239 05/11/2022      Chemistry      Component Value Date/Time   NA 140 05/11/2022 1010   NA 141 01/18/2015 0835   K 4.2 05/11/2022 1010   K 4.1 01/18/2015 0835   CL 104 05/11/2022 1010   CO2 29 05/11/2022 1010   CO2 25 01/18/2015 0835   BUN 21 05/11/2022 1010   BUN 21.5 01/18/2015 0835   CREATININE 1.33 (H) 05/11/2022 1010   CREATININE 1.1 01/18/2015 0835      Component Value Date/Time   CALCIUM 9.4 05/11/2022  1010   CALCIUM 9.8 01/18/2015 0835   ALKPHOS 51 05/11/2022 1010   ALKPHOS 66 01/18/2015 0835   AST 18 05/11/2022 1010   AST 15 01/18/2015 0835   ALT 15 05/11/2022 1010   ALT 16 01/18/2015 0835   BILITOT 0.8 05/11/2022 1010   BILITOT 0.91 01/18/2015 0835       RADIOGRAPHIC STUDIES: CT Chest W Contrast  Result Date: 05/11/2022 CLINICAL DATA:  History of recurrent renal cell carcinoma. * Tracking Code: BO * EXAM: CT CHEST WITH CONTRAST CT ABDOMEN AND PELVIS WITH AND WITHOUT CONTRAST TECHNIQUE: Multidetector CT imaging of the chest was performed during intravenous contrast administration. Multidetector CT imaging of the abdomen and pelvis was performed following the standard protocol before and during bolus  administration of intravenous contrast. RADIATION DOSE REDUCTION: This exam was performed according to the departmental dose-optimization program which includes automated exposure control, adjustment of the mA and/or kV according to patient size and/or use of iterative reconstruction technique. CONTRAST:  27mL OMNIPAQUE IOHEXOL 300 MG/ML  SOLN COMPARISON:  Multiple priors including most recent CT May 02, 2021 FINDINGS: CT CHEST FINDINGS Cardiovascular: Aortic atherosclerosis. Normal caliber thoracic aorta. No central pulmonary embolus on this nondedicated study. Normal size heart. Trace pericardial effusion is within physiologic normal limits and unchanged from prior. Mediastinum/Nodes: No suspicious thyroid nodule. No pathologically enlarged mediastinal, hilar or axillary lymph nodes. The esophagus is grossly unremarkable. Lungs/Pleura: Mild biapical pleuroparenchymal scarring. Mild paraseptal and centrilobular emphysema. Stable 3 mm pulmonary nodule in the left lower lobe on image 95/15. No new suspicious pulmonary nodules or masses. Calcified granuloma in the right upper lobe on image 57/5. Scattered atelectasis/scarring. No pleural effusion. No pneumothorax. Musculoskeletal: No aggressive lytic or blastic lesion of bone. Anterior cervical fusion hardware. Thoracic diffuse idiopathic skeletal hyperostosis. CT ABDOMEN AND PELVIS FINDINGS Hepatobiliary: No suspicious hepatic lesion. Gallbladder is unremarkable. No biliary ductal dilation. Pancreas: No pancreatic ductal dilation or evidence of acute inflammation. Spleen: No splenomegaly. Adrenals/Urinary Tract: Adrenal glands are within normal limits. Right kidney surgically absent without new suspicious enhancing nodularity in the nephrectomy bed. No left-sided hydronephrosis. Nonenhancing fluid density 4.3 cm left renal lesion is compatible with a cyst and considered benign requiring no independent imaging follow-up. Additional hypodense renal lesions are  technically too small to accurately characterize but statistically likely to reflect cysts. Urinary bladder is unremarkable for degree of distension Stomach/Bowel: Radiopaque enteric contrast material traverses the descending colon. Stomach is unremarkable for degree of distension. No pathologic dilation of small or large bowel. Normal appendix. Moderate volume of formed stool in the colon. Colonic diverticulosis without findings of acute diverticulitis Vascular/Lymphatic: Aortic atherosclerosis. Smooth IVC contours no pathologically enlarged abdominal or pelvic lymph nodes Reproductive: Brachytherapy seeds in the prostate. Other: Fat containing right inguinal hernia no significant abdominopelvic free fluid Musculoskeletal: No aggressive lytic or blastic lesion of bone. Well-circumscribed lucent lesion in the right inter trochanteric femur measuring 18 mm on image 67/12, with narrow zone of transition, is stable dating back to at least October 27, 2018 compatible with a benign finding. Multilevel degenerative changes spine. IMPRESSION: 1. Stable examination status post right nephrectomy without evidence of local recurrence or metastatic disease within the chest, abdomen, or pelvis. 2. Stable 3 mm left lower lobe pulmonary nodule. Continued attention on follow-up imaging suggested. 3. Colonic diverticulosis without findings of acute diverticulitis. 4. Moderate volume of formed stool in the colon. Correlate for constipation. 5. Aortic Atherosclerosis (ICD10-I70.0) and Emphysema (ICD10-J43.9). Electronically Signed   By: Andree Moro.D.  On: 05/11/2022 13:58   CT Abdomen Pelvis W Wo Contrast  Result Date: 05/11/2022 CLINICAL DATA:  History of recurrent renal cell carcinoma. * Tracking Code: BO * EXAM: CT CHEST WITH CONTRAST CT ABDOMEN AND PELVIS WITH AND WITHOUT CONTRAST TECHNIQUE: Multidetector CT imaging of the chest was performed during intravenous contrast administration. Multidetector CT imaging of the  abdomen and pelvis was performed following the standard protocol before and during bolus administration of intravenous contrast. RADIATION DOSE REDUCTION: This exam was performed according to the departmental dose-optimization program which includes automated exposure control, adjustment of the mA and/or kV according to patient size and/or use of iterative reconstruction technique. CONTRAST:  80mL OMNIPAQUE IOHEXOL 300 MG/ML  SOLN COMPARISON:  Multiple priors including most recent CT May 02, 2021 FINDINGS: CT CHEST FINDINGS Cardiovascular: Aortic atherosclerosis. Normal caliber thoracic aorta. No central pulmonary embolus on this nondedicated study. Normal size heart. Trace pericardial effusion is within physiologic normal limits and unchanged from prior. Mediastinum/Nodes: No suspicious thyroid nodule. No pathologically enlarged mediastinal, hilar or axillary lymph nodes. The esophagus is grossly unremarkable. Lungs/Pleura: Mild biapical pleuroparenchymal scarring. Mild paraseptal and centrilobular emphysema. Stable 3 mm pulmonary nodule in the left lower lobe on image 95/15. No new suspicious pulmonary nodules or masses. Calcified granuloma in the right upper lobe on image 57/5. Scattered atelectasis/scarring. No pleural effusion. No pneumothorax. Musculoskeletal: No aggressive lytic or blastic lesion of bone. Anterior cervical fusion hardware. Thoracic diffuse idiopathic skeletal hyperostosis. CT ABDOMEN AND PELVIS FINDINGS Hepatobiliary: No suspicious hepatic lesion. Gallbladder is unremarkable. No biliary ductal dilation. Pancreas: No pancreatic ductal dilation or evidence of acute inflammation. Spleen: No splenomegaly. Adrenals/Urinary Tract: Adrenal glands are within normal limits. Right kidney surgically absent without new suspicious enhancing nodularity in the nephrectomy bed. No left-sided hydronephrosis. Nonenhancing fluid density 4.3 cm left renal lesion is compatible with a cyst and considered  benign requiring no independent imaging follow-up. Additional hypodense renal lesions are technically too small to accurately characterize but statistically likely to reflect cysts. Urinary bladder is unremarkable for degree of distension Stomach/Bowel: Radiopaque enteric contrast material traverses the descending colon. Stomach is unremarkable for degree of distension. No pathologic dilation of small or large bowel. Normal appendix. Moderate volume of formed stool in the colon. Colonic diverticulosis without findings of acute diverticulitis Vascular/Lymphatic: Aortic atherosclerosis. Smooth IVC contours no pathologically enlarged abdominal or pelvic lymph nodes Reproductive: Brachytherapy seeds in the prostate. Other: Fat containing right inguinal hernia no significant abdominopelvic free fluid Musculoskeletal: No aggressive lytic or blastic lesion of bone. Well-circumscribed lucent lesion in the right inter trochanteric femur measuring 18 mm on image 67/12, with narrow zone of transition, is stable dating back to at least October 27, 2018 compatible with a benign finding. Multilevel degenerative changes spine. IMPRESSION: 1. Stable examination status post right nephrectomy without evidence of local recurrence or metastatic disease within the chest, abdomen, or pelvis. 2. Stable 3 mm left lower lobe pulmonary nodule. Continued attention on follow-up imaging suggested. 3. Colonic diverticulosis without findings of acute diverticulitis. 4. Moderate volume of formed stool in the colon. Correlate for constipation. 5. Aortic Atherosclerosis (ICD10-I70.0) and Emphysema (ICD10-J43.9). Electronically Signed   By: Dahlia Bailiff M.D.   On: 05/11/2022 13:58    ASSESSMENT AND PLAN: This is a very pleasant 77 years old white male with history of a stage IV clear-cell renal cell carcinoma initially diagnosed as a stage III in April 2020 with evidence of spermatic cord involvement in August 2020.  He is status post right  radical nephrectomy in April 2020 and the final pathology showed clear-cell renal cell carcinoma invading into the perineural fat indicating stage T3a with negative margin.  Status post orchiectomy in August 2020 and the final pathology showed metastatic carcinoma consistent with clear-cell histology. He is status post 11 cycle of treatment with Keytruda and axitinib between January 26, 2019 through May 2021 discontinued based on The patient's request and had complete response at that time. He is currently on observation and feeling fine with no concerning complaints. The patient had repeat CT scan of the chest, abdomen and pelvis performed recently.  I personally and independently reviewed the scan and discussed the result with the patient today.  His scan showed no concerning findings for disease recurrence or metastasis. I recommended for him to continue on observation with repeat CT scan of the chest, abdomen and pelvis in 6 months. Regarding the polycythemia, I will arrange for the patient to receive therapeutic phlebotomy today. He was advised to call immediately if he has any other concerning symptoms in the interval. The patient voices understanding of current disease status and treatment options and is in agreement with the current care plan.  All questions were answered. The patient knows to call the clinic with any problems, questions or concerns. We can certainly see the patient much sooner if necessary.  The total time spent in the appointment was 40 minutes.  Disclaimer: This note was dictated with voice recognition software. Similar sounding words can inadvertently be transcribed and may not be corrected upon review.

## 2022-06-01 NOTE — Progress Notes (Signed)
[name] presents today for phlebotomy per MD orders. Phlebotomy procedure started at 1504 and ended at 1509. 512 grams removed. 16G Phlebotomy kit used in RAC Patient declined 30 minute observation period, declined food and drink. VSS at time of discharge.  Patient tolerated procedure well. IV needle removed intact.

## 2022-06-01 NOTE — Patient Instructions (Signed)

## 2022-06-04 ENCOUNTER — Ambulatory Visit: Payer: Medicare Other | Admitting: Physical Therapy

## 2022-06-08 ENCOUNTER — Ambulatory Visit: Payer: Medicare Other

## 2022-06-08 ENCOUNTER — Encounter: Payer: Self-pay | Admitting: Physical Therapy

## 2022-06-08 ENCOUNTER — Ambulatory Visit: Payer: Medicare Other | Admitting: Physical Therapy

## 2022-06-08 DIAGNOSIS — R29898 Other symptoms and signs involving the musculoskeletal system: Secondary | ICD-10-CM | POA: Diagnosis not present

## 2022-06-08 DIAGNOSIS — M436 Torticollis: Secondary | ICD-10-CM | POA: Diagnosis not present

## 2022-06-08 DIAGNOSIS — M5412 Radiculopathy, cervical region: Secondary | ICD-10-CM | POA: Diagnosis not present

## 2022-06-08 DIAGNOSIS — Z7409 Other reduced mobility: Secondary | ICD-10-CM | POA: Diagnosis not present

## 2022-06-08 NOTE — Therapy (Signed)
OUTPATIENT PHYSICAL THERAPY CERVICAL TREATMENT   Patient Name: [name] MRN: [id number] DOB:1945-05-12, 77 y.o., male Today's Date: 06/08/2022  END OF SESSION:  PT End of Session - 06/08/22 0939     Visit Number 3    Date for PT Re-Evaluation 08/10/22    PT Start Time 0932    PT Stop Time [id number]    PT Time Calculation (min) 63 min    Activity Tolerance Patient tolerated treatment well    Behavior During Therapy [facility] for tasks assessed/performed             Past Medical History:  Diagnosis Date   Anxiety    Arthritis    Diverticulitis    Hypertension    Inguinal hernia    Metastatic renal cell carcinoma ([place]) dx'd 2020   Polycythemia    phlebotomy 06/15/13   Prostate cancer ([place]) dx'd 2008   seed implant   Wears glasses    Wears hearing aid    both ears   Past Surgical History:  Procedure Laterality Date   COLONOSCOPY     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   Procedure: EXCISION LEFT DUPUYTRENS RING AND SMALL FINGERS;  Surgeon: Cammie Sickle., MD;  Location: [place];  Service: Orthopedics;  Laterality: Left;   ESOPHAGOGASTRODUODENOSCOPY (EGD) WITH PROPOFOL N/A 05/12/2018   Procedure: ESOPHAGOGASTRODUODENOSCOPY (EGD) WITH PROPOFOL;  Surgeon: Laurence Spates, MD;  [facility];  Service: Endoscopy;  Laterality: N/A;   HERNIA REPAIR  2010   lt ing with lt hydrocele   IR IMAGING GUIDED PORT INSERTION  01/20/2019   IR REMOVAL TUN ACCESS W/ PORT W/O FL MOD SED  11/21/2019   ORCHIECTOMY Right 10/29/2018   Procedure: ORCHIECTOMY radical;  Surgeon: Alexis Frock, MD;  [facility];  Service: Urology;  Laterality: Right;  97 Echo IMPLANT  2006   hx of prostate   ROBOT ASSISTED LAPAROSCOPIC NEPHRECTOMY Right 06/22/2018   Procedure: XI ROBOTIC ASSISTED LAPAROSCOPIC NEPHRECTOMY;  Surgeon: Alexis Frock, MD;  [facility];  Service: Urology;  Laterality: Right;  3 HRS   SHOULDER ARTHROSCOPY  11/13   right    Patient Active Problem List   Diagnosis Date Noted   Pruritus 01/18/2020   Primary malignant neoplasm of kidney with metastasis from kidney to other site [facility]) 01/13/2019   Goals of care, counseling/discussion 01/13/2019   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 Scapular and the right arm, he had an injection in the back and it helped for a day but the pain has returned.  PERTINENT HISTORY:  C5/6 fusion, and " something at c 7 9 weeks ago  PAIN:  Are you having pain? Yes: NPRS scale: 8/10 Pain location: R medial scapular border, R post upper arm Pain description: deep , penetrating, sharp pain Aggravating factors: cervical spine movement Relieving factors: any anti inflammatory meds  PRECAUTIONS: Cervical  WEIGHT BEARING RESTRICTIONS: No  FALLS:  Has patient fallen in last 6 months? No  LIVING ENVIRONMENT: Lives with: lives with their family and lives with their spouse Lives in: House/apartment Stairs: Yes: External: 4 steps; can reach both Has following equipment at home: None  OCCUPATION: retired  PLOF: Independent  PATIENT GOALS: get some relief from this pain  NEXT MD VISIT: unknown  OBJECTIVE:   DIAGNOSTIC FINDINGS:  None found  PATIENT SURVEYS:  NDI 60  COGNITION: Overall cognitive status: Within functional limits for tasks  assessed  SENSATION: WFL  POSTURE:  Maintains neck in position of slight L lateral flexion to 10 degrees  PALPATION: Tender R posterior cervical paraspinals C 3, 4, 5, R upper traps, R coracoid process   CERVICAL ROM:   Active ROM A/PROM (deg) eval  Flexion wfl  Extension 10  Right lateral flexion 0  Left lateral flexion 25  Right rotation 25  Left rotation 25   (Blank rows = not tested)  UPPER EXTREMITY ROM: full AROM vs gravity B UE's  UPPER EXTREMITY MMT:  MMT not specifically assessed today, due to pain/ irritability  CERVICAL SPECIAL TESTS:  Upper limb tension test (ULTT): Positive for ULTT B radial nerve Spurling's +  TODAY'S TREATMENT:                                                                                                                              DATE:  06/08/22 UBE level 1 x 4 minutes total Back to wall 5# shrugs with upper trap and levator stretches Red tband ER and horizontal abduction W backs 5# shrugs with easy upper trap and levator stretches Cross arm stretch Rounding thoracic stretch by grabbing doorknob DN to the rhomboid significant spasms, some teres STM to the above Ice/IFC to the right rhomboid   05/25/22:  Initially positioned patient in supine, head of treatment table elevated, for electrical stimulation, IFC, x 15 min total, with ice upper back.  Crossed the e stim from cervical paraspinals to B rhomboids, 4 pads  Manual:  deep, pressure, palpation along R upper traps, rhomboids, levator scapulae.  Dry needling with 60 mm needle, R rhomboid and R levator scapula over 2 points each with twitch response.  Verbally instructed in isometric B shoulder ER with arms by trunk 10 sec holds, printed and copied to my chart.  Did not have patient perform prior to  leaving due to very good response to the other techniques.   05/18/22: Manual: Dry needling:  2 sites, R upper traps, R multifidus C 7.  Marked muscle twitch R upper  traps  Interferential current posterior c -spine, 4 pads, crossed current, x 10 min for pain management  Instructed in isometric cervical spine for L cervical rotation, and cervical forward flexion, for pain management, provided written instructions  PATIENT EDUCATION:  Education details: POC, goals Person educated: Patient Education method: Explanation, Demonstration, and Tactile cues Education comprehension: verbalized understanding  HOME EXERCISE PROGRAM: Access Code: [email or web address] URL: [email or web address] Date: 05/25/2022 Prepared by: Amy Speaks  Exercises - Shoulder External Rotation and Scapular Retraction with Resistance  - 1 x daily - 7 x weekly - 1 sets - 10 reps - 10 sec hold Access Code: [email or web address] URL: [email or web address] Date: 05/18/2022 Prepared by: Amy Speaks  Exercises - Standing Isometric Cervical Flexion with Manual Resistance  - 1 x daily - 7 x weekly - 3 sets - 10 reps - Seated Isometric Cervical Rotation  - 1 x daily - 7 x weekly - 3 sets - 10 reps  ASSESSMENT:  CLINICAL IMPRESSION: Patient very frustrated with his pain levels.  Still very painful seems to start from the rhomboid area, reports recent MRI showed "good surgery".  I started some exercises to get the mms moving, we did DN and got significant LTR's in the right rhomboid, some in the teres as well, did STM with the theragun and then the ice estim, he told me upon leaving that about 90% of his pain was gone. OBJECTIVE IMPAIRMENTS: decreased activity tolerance, decreased ROM, decreased strength, hypomobility, impaired perceived functional ability, impaired flexibility, and pain.   ACTIVITY LIMITATIONS: carrying, lifting, sleeping, bed mobility, dressing, and reach over head  PARTICIPATION LIMITATIONS: meal prep, driving, community activity, and yard work  PERSONAL FACTORS: 1 comorbidity: hyperlipidemia  are also affecting patient's functional outcome.   REHAB POTENTIAL:  Good  CLINICAL DECISION MAKING: Stable/uncomplicated  EVALUATION COMPLEXITY: Low   GOALS: Goals reviewed with patient? Yes  SHORT TERM GOALS: Target date: 06/01/22  I HEP Baseline: initiated today 05/18/22 Goal status: INITIAL    LONG TERM GOALS: Target date: 08/10/22  Improve radicular pain, improve cervical spine R side bending motion to 10 degrees without radicular Sx to allow for better tolerance for sleeping,field of vision, ADL's Baseline: 0 degrees R cervical side bending with UE Sx provocation Goal status: INITIAL  2.  NDI improve from 60 % deficit to 40 % or better Baseline: 60% Goal status: ongoing  3.  Improve R UE strength for grasp , shoulder MMT to within 80% of L UE, will test when less provocative Sx Baseline: patient states weak grasp and wrist extension R although not formally tested today Goal status: ongoing   PLAN:  PT FREQUENCY: 1-2x/week  PT DURATION: 12 weeks  PLANNED INTERVENTIONS: Therapeutic exercises, Therapeutic activity, Neuromuscular re-education, Balance training, Gait training, Patient/Family education, Self Care, Joint mobilization, and Dry Needling  PLAN FOR NEXT SESSION: Assess how he responded to today's dry needling and isometrics, e stim. Progress with nerve glides, movements, stretches   Ksean Vale W, PT 06/08/2022, 10:27 AM

## 2022-06-11 ENCOUNTER — Encounter: Payer: Self-pay | Admitting: Physical Therapy

## 2022-06-11 ENCOUNTER — Ambulatory Visit: Payer: Medicare Other | Admitting: Physical Therapy

## 2022-06-11 DIAGNOSIS — R29898 Other symptoms and signs involving the musculoskeletal system: Secondary | ICD-10-CM

## 2022-06-11 DIAGNOSIS — M5412 Radiculopathy, cervical region: Secondary | ICD-10-CM

## 2022-06-11 DIAGNOSIS — M436 Torticollis: Secondary | ICD-10-CM

## 2022-06-11 DIAGNOSIS — Z7409 Other reduced mobility: Secondary | ICD-10-CM | POA: Diagnosis not present

## 2022-06-11 NOTE — Therapy (Signed)
OUTPATIENT PHYSICAL THERAPY CERVICAL TREATMENT   Patient Name: [name] MRN: [id number] DOB:[birth year], 77 y.o., male Today's Date: 06/11/2022  END OF SESSION:  PT End of Session - 06/11/22 0759     Visit Number 4    Date for PT Re-Evaluation 08/10/22    PT Start Time 0758    PT Stop Time 0843    PT Time Calculation (min) 45 min    Activity Tolerance Patient tolerated treatment well    Behavior During Therapy [facility] for tasks assessed/performed             Past Medical History:  Diagnosis Date   Anxiety    Arthritis    Diverticulitis    Hypertension    Inguinal hernia    Metastatic renal cell carcinoma ([place]) dx'd 2020   Polycythemia    phlebotomy 06/15/13   Prostate cancer ([place]) dx'd 2008   seed implant   Wears glasses    Wears hearing aid    both ears   Past Surgical History:  Procedure Laterality Date   COLONOSCOPY     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   Procedure: EXCISION LEFT DUPUYTRENS RING AND SMALL FINGERS;  Surgeon: Cammie Sickle., MD;  Location: [place];  Service: Orthopedics;  Laterality: Left;   ESOPHAGOGASTRODUODENOSCOPY (EGD) WITH PROPOFOL N/A 05/12/2018   Procedure: ESOPHAGOGASTRODUODENOSCOPY (EGD) WITH PROPOFOL;  Surgeon: Laurence Spates, MD;  [facility];  Service: Endoscopy;  Laterality: N/A;   HERNIA REPAIR  2010   lt ing with lt hydrocele   IR IMAGING GUIDED PORT INSERTION  01/20/2019   IR REMOVAL TUN ACCESS W/ PORT W/O FL MOD SED  11/21/2019   ORCHIECTOMY Right 10/29/2018   Procedure: ORCHIECTOMY radical;  Surgeon: Alexis Frock, MD;  [facility];  Service: Urology;  Laterality: Right;  4 [place] IMPLANT  2006   hx of prostate   ROBOT ASSISTED LAPAROSCOPIC NEPHRECTOMY Right 06/22/2018   Procedure: XI ROBOTIC ASSISTED LAPAROSCOPIC NEPHRECTOMY;  Surgeon: Alexis Frock, MD;  [facility];  Service: Urology;  Laterality: Right;  3 HRS   SHOULDER ARTHROSCOPY  11/13   right    Patient Active Problem List   Diagnosis Date Noted   Pruritus 01/18/2020   Primary malignant neoplasm of kidney with metastasis from kidney to other site [facility]) 01/13/2019   Goals of care, counseling/discussion 01/13/2019   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 Reports that he felt about 80% better when he left after last visit but then 30 minutes later same pain  PERTINENT HISTORY:  C5/6 fusion, and " something at c 7 9 weeks ago  PAIN:  Are you having pain? Yes: NPRS scale: 8/10 Pain location: R medial scapular border, R post upper arm Pain description: deep , penetrating, sharp pain Aggravating factors: cervical spine movement Relieving factors: any anti inflammatory meds  PRECAUTIONS: Cervical  WEIGHT BEARING RESTRICTIONS: No  FALLS:  Has patient fallen in last 6 months? No  LIVING ENVIRONMENT: Lives with: lives with their family and lives with their spouse Lives in: House/apartment Stairs: Yes: External: 4 steps; can reach both Has following equipment at home: None  OCCUPATION: retired  PLOF: Independent  PATIENT GOALS: get some relief from this pain  NEXT MD VISIT: unknown  OBJECTIVE:   DIAGNOSTIC FINDINGS:  None found  PATIENT SURVEYS:  NDI 60  COGNITION: Overall cognitive status: Within functional limits for tasks  assessed  SENSATION: WFL  POSTURE:  Maintains neck in position of slight L lateral flexion to 10 degrees  PALPATION: Tender R posterior cervical paraspinals C 3, 4, 5, R upper traps, R coracoid process   CERVICAL ROM:   Active ROM A/PROM (deg) eval  Flexion wfl  Extension 10  Right lateral flexion 0  Left lateral flexion 25  Right rotation 25  Left rotation 25   (Blank rows = not tested)  UPPER EXTREMITY ROM: full AROM vs gravity B UE's  UPPER EXTREMITY MMT:  MMT not specifically assessed today, due to pain/ irritability  CERVICAL SPECIAL TESTS:  Upper limb tension test (ULTT): Positive for ULTT B radial nerve Spurling's +  TODAY'S TREATMENT:                                                                                                                              DATE:  06/11/22 Adson's Test negative Manual cervical distraction, occipital release Nerve glides right UE Passive cervical ROM Manual shoulder depression  06/08/22 UBE level 1 x 4 minutes total Back to wall 5# shrugs with upper trap and levator stretches Red tband ER and horizontal abduction W backs 5# shrugs with easy upper trap and levator stretches Cross arm stretch Rounding thoracic stretch by grabbing doorknob DN to the rhomboid significant spasms, some teres STM to the above Ice/IFC to the right rhomboid   05/25/22:  Initially positioned patient in supine, head of treatment table elevated, for electrical stimulation, IFC, x 15 min total, with ice upper back.  Crossed the e stim from cervical paraspinals to B rhomboids, 4 pads  Manual:  deep, pressure, palpation along R upper traps, rhomboids, levator scapulae.  Dry needling with 60 mm needle, R rhomboid and R levator scapula over 2 points each with twitch response.  Verbally instructed in isometric B shoulder ER with arms by trunk  10 sec holds, printed and copied to my chart.  Did not have patient perform prior to leaving due to very good  response to the other techniques.   05/18/22: Manual: Dry needling:  2 sites, R upper traps, R multifidus C 7.  Marked muscle twitch R upper traps  Interferential current posterior c -spine, 4 pads, crossed current, x 10 min for pain management  Instructed in isometric cervical spine for L cervical rotation, and cervical forward flexion, for pain management, provided written instructions  PATIENT EDUCATION:  Education details: POC, goals Person educated: Patient Education method: Explanation, Demonstration, and Tactile cues Education comprehension: verbalized understanding  HOME EXERCISE PROGRAM: Access Code: [email or web address] URL: [email or web address] Date: 05/25/2022 Prepared by: Amy Speaks  Exercises - Shoulder External Rotation and Scapular Retraction with Resistance  - 1 x daily - 7 x weekly - 1 sets - 10 reps - 10 sec hold Access Code: [email or web address] URL: [email or web address] Date: 05/18/2022 Prepared by: Amy Speaks  Exercises - Standing Isometric Cervical Flexion with Manual Resistance  - 1 x daily - 7 x weekly - 3 sets - 10 reps - Seated Isometric Cervical Rotation  - 1 x daily - 7 x weekly - 3 sets - 10 reps  ASSESSMENT:  CLINICAL IMPRESSION: Patient very frustrated with his pain levels.  Continues to have pain, gets very temporary relief from PT, he will see MD Monday.  I did look at TOS and he had a negative Adson's test for me this morning.  Has a lot of spasms and gaurding due to pain OBJECTIVE IMPAIRMENTS: decreased activity tolerance, decreased ROM, decreased strength, hypomobility, impaired perceived functional ability, impaired flexibility, and pain.   ACTIVITY LIMITATIONS: carrying, lifting, sleeping, bed mobility, dressing, and reach over head  PARTICIPATION LIMITATIONS: meal prep, driving, community activity, and yard work  PERSONAL FACTORS: 1 comorbidity: hyperlipidemia  are also affecting patient's functional outcome.   REHAB POTENTIAL:  Good  CLINICAL DECISION MAKING: Stable/uncomplicated  EVALUATION COMPLEXITY: Low   GOALS: Goals reviewed with patient? Yes  SHORT TERM GOALS: Target date: 06/01/22  I HEP Baseline: initiated today 05/18/22 Goal status: INITIAL    LONG TERM GOALS: Target date: 08/10/22  Improve radicular pain, improve cervical spine R side bending motion to 10 degrees without radicular Sx to allow for better tolerance for sleeping,field of vision, ADL's Baseline: 0 degrees R cervical side bending with UE Sx provocation Goal status: INITIAL  2.  NDI improve from 60 % deficit to 40 % or better Baseline: 60% Goal status: ongoing  3.  Improve R UE strength for grasp , shoulder MMT to within 80% of L UE, will test when less provocative Sx Baseline: patient states weak grasp and wrist extension R although not formally tested today Goal status: ongoing   PLAN:  PT FREQUENCY: 1-2x/week  PT DURATION: 12 weeks  PLANNED INTERVENTIONS: Therapeutic exercises, Therapeutic activity, Neuromuscular re-education, Balance training, Gait training, Patient/Family education, Self Care, Joint mobilization, and Dry Needling  PLAN FOR NEXT SESSION: he will See MD on Monday, I asked him to call and let us know what the result/outcome is    Sumner Boast, PT 06/11/2022, 8:00 AM

## 2022-06-15 ENCOUNTER — Ambulatory Visit: Payer: Medicare Other

## 2022-06-15 DIAGNOSIS — M4722 Other spondylosis with radiculopathy, cervical region: Secondary | ICD-10-CM | POA: Diagnosis not present

## 2022-06-15 DIAGNOSIS — Z6828 Body mass index (BMI) 28.0-28.9, adult: Secondary | ICD-10-CM | POA: Diagnosis not present

## 2022-06-30 DIAGNOSIS — Z981 Arthrodesis status: Secondary | ICD-10-CM | POA: Diagnosis not present

## 2022-06-30 DIAGNOSIS — M5412 Radiculopathy, cervical region: Secondary | ICD-10-CM | POA: Diagnosis not present

## 2022-06-30 DIAGNOSIS — M4802 Spinal stenosis, cervical region: Secondary | ICD-10-CM | POA: Diagnosis not present

## 2022-07-22 DIAGNOSIS — M4722 Other spondylosis with radiculopathy, cervical region: Secondary | ICD-10-CM | POA: Diagnosis not present

## 2022-07-28 DIAGNOSIS — M25511 Pain in right shoulder: Secondary | ICD-10-CM | POA: Diagnosis not present

## 2022-08-11 ENCOUNTER — Other Ambulatory Visit: Payer: Self-pay

## 2022-08-11 DIAGNOSIS — R202 Paresthesia of skin: Secondary | ICD-10-CM

## 2022-08-14 ENCOUNTER — Ambulatory Visit (INDEPENDENT_AMBULATORY_CARE_PROVIDER_SITE_OTHER): Payer: Medicare Other | Admitting: Neurology

## 2022-08-14 DIAGNOSIS — R202 Paresthesia of skin: Secondary | ICD-10-CM | POA: Diagnosis not present

## 2022-08-14 DIAGNOSIS — M5412 Radiculopathy, cervical region: Secondary | ICD-10-CM

## 2022-08-14 DIAGNOSIS — G5621 Lesion of ulnar nerve, right upper limb: Secondary | ICD-10-CM

## 2022-08-14 NOTE — Procedures (Signed)
[facility]  [address], [address]  [place], [place] [address] Tel: [phone] Fax: [phone] Test Date:  08/14/2022  Patient: [name] DOB: July 12, 1945 Physician: Nita Sickle, DO  Sex: Male Height: 5\' 7"  Ref Phys: [name], MD  ID#: [id number]   Technician:    History: This is a 77 year old man s/p ACDF C5-6 and C6-7 referred for evaluation of right arm pain, paresthesia, and weakness.   NCV & EMG Findings: Extensive electrodiagnostic testing of the upper extremity shows:  Right ulnar sensory response shows prolonged latency (3.7 ms).  Right median, radial, medial antebrachial, and lateral antebrachial cutaneous sensory responses are within normal limits. Right ulnar motor response shows prolonged latency (3.8 ms) and decreased conduction velocity (A Elbow-B Elbow, 38 m/s).  Right median motor responses within normal limits.  Chronic motor axon loss changes are seen affecting the C8 myotome, without accompanying active denervation.  These findings are more severe in the ulnar innervated muscles.  Impression: Right ulnar neuropathy with slowing across the elbow, demyelinating, moderate. Chronic C8 radiculopathy affecting the right upper extremity, mild. There is no evidence of a brachial plexopathy affecting the right upper extremity.   ___________________________ Nita Sickle, DO    Nerve Conduction Studies   Stim Site NR Peak (ms) Norm Peak (ms) O-P Amp (V) Norm O-P Amp  Right Lat Ante Brach Cutan Anti Sensory (Lat Forearm)  32 C  Lat Biceps    2.2 <2.8 11.5 >10  Right Med Ante Brach [place]an Anti Sensory (Med Forearm)  32 C  Elbow    2.6  5.4   Right Median Anti Sensory (2nd Digit)  32 C  Wrist    3.6 <3.8 24.1 >10  Right Radial Anti Sensory (Base 1st Digit)  32 C  Wrist    2.4 <2.8 20.3 >10  Right Ulnar Anti Sensory (5th Digit)  32 C  Wrist    *3.7 <3.2 8.4 >5     Stim Site NR Onset (ms) Norm Onset (ms) O-P Amp (mV) Norm O-P Amp  Site1 Site2 Delta-0 (ms) Dist (cm) Vel (m/s) Norm Vel (m/s)  Right Median Motor (Abd Poll Brev)  32 C  Wrist    3.7 <4.0 11.0 >5 Elbow Wrist 5.3 30.0 57 >50  Elbow    9.0  10.4         Right Ulnar Motor (Abd Dig Minimi)  32 C  Wrist    *3.8 <3.1 8.1 >7 B Elbow Wrist 3.6 23.0 64 >50  B Elbow    7.4  7.3  A Elbow B Elbow 2.6 10.0 *38 >50  A Elbow    10.0  6.4          Electromyography   Side Muscle Ins.Act Fibs Fasc Recrt Amp Dur Poly Activation Comment  Right 1stDorInt Nml Nml Nml *2- *1+ *1+ *1+ Nml N/A  Right Abd Poll Brev Nml Nml Nml Nml Nml Nml Nml Nml N/A  Right ABD Dig Min Nml Nml Nml *2- *1+ *1+ *1+ Nml N/A  Right FlexPolLong Nml Nml Nml *1- *1+ *1+ *1+ Nml N/A  Right Ext Indicis Nml Nml Nml *1- *1+ *1+ *1+ Nml N/A  Right PronatorTeres Nml Nml Nml Nml Nml Nml Nml Nml N/A  Right Biceps Nml Nml Nml Nml Nml Nml Nml Nml N/A  Right Triceps Nml Nml Nml *1- *1+ *1+ *1+ Nml N/A  Right Deltoid Nml Nml Nml Nml Nml Nml Nml Nml N/A  Right Infraspinatus Nml Nml Nml Nml Nml Nml  Nml Nml N/A  Right Cervical Parasp Low Nml Nml Nml Nml Nml Nml Nml Nml N/A  Right FlexCarpiUln Nml Nml Nml *2- *1+ *1+ *1+ Nml N/A      Waveforms:

## 2022-08-18 DIAGNOSIS — M4722 Other spondylosis with radiculopathy, cervical region: Secondary | ICD-10-CM | POA: Diagnosis not present

## 2022-08-18 DIAGNOSIS — M542 Cervicalgia: Secondary | ICD-10-CM | POA: Diagnosis not present

## 2022-08-19 DIAGNOSIS — D334 Benign neoplasm of spinal cord: Secondary | ICD-10-CM | POA: Diagnosis not present

## 2022-08-20 ENCOUNTER — Other Ambulatory Visit: Payer: Self-pay | Admitting: Neurosurgery

## 2022-08-24 NOTE — Pre-Procedure Instructions (Signed)
Surgical Instructions   Your procedure is scheduled on Wednesday, June 12th. Report to [facility] Main Entrance "A" at 10:30 A.M., then check in with the Admitting office. Any questions or running late day of surgery: call [phone]  Questions prior to your surgery date: call [phone], Monday-Friday, 8am-4pm. If you experience any cold or flu symptoms such as cough, fever, chills, shortness of breath, etc. between now and your scheduled surgery, please notify us at the above number.     Remember:  Do not eat or drink after midnight the night before your surgery     Take these medicines the morning of surgery with A SIP OF WATER  pravastatin (PRAVACHOL)     May take these medicines IF NEEDED: busPIRone (BUSPAR)  Carboxymethylcellulose Sodium (THERATEARS OP)  fluticasone (FLONASE)  traMADol (ULTRAM)    Follow your surgeon's instructions on when to stop Aspirin.  If no instructions were given by your surgeon then you will need to call the office to get those instructions.     One week prior to surgery, STOP taking any Aleve, Naproxen, Ibuprofen, Motrin, Advil, Goody's, BC's, all herbal medications, fish oil, and non-prescription vitamins.                     Do NOT Smoke (Tobacco/Vaping) for 24 hours prior to your procedure.  If you use a CPAP at night, you may bring your mask/headgear for your overnight stay.   You will be asked to remove any contacts, glasses, piercing's, hearing aid's, dentures/partials prior to surgery. Please bring cases for these items if needed.    Patients discharged the day of surgery will not be allowed to drive home, and someone needs to stay with them for 24 hours.  SURGICAL WAITING ROOM VISITATION Patients may have no more than 2 support people in the waiting area - these visitors may rotate.   Pre-op nurse will coordinate an appropriate time for 1 ADULT support person, who may not rotate, to accompany patient in pre-op.  Children under the  age of 55 must have an adult with them who is not the patient and must remain in the main waiting area with an adult.  If the patient needs to stay at the hospital during part of their recovery, the visitor guidelines for inpatient rooms apply.  Please refer to the [facility] website for the visitor guidelines for any additional information.   If you received a COVID test during your pre-op visit  it is requested that you wear a mask when out in public, stay away from anyone that may not be feeling well and notify your surgeon if you develop symptoms. If you have been in contact with anyone that has tested positive in the last 10 days please notify you surgeon.      Pre-operative 5 CHG Bath Instructions   You can play a key role in reducing the risk of infection after surgery. Your skin needs to be as free of germs as possible. You can reduce the number of germs on your skin by washing with CHG (chlorhexidine gluconate) soap before surgery. CHG is an antiseptic soap that kills germs and continues to kill germs even after washing.   DO NOT use if you have an allergy to chlorhexidine/CHG or antibacterial soaps. If your skin becomes reddened or irritated, stop using the CHG and notify one of our RNs at [phone].   Please shower with the CHG soap starting 4 days before surgery using the following  schedule:     Please keep in mind the following:  DO NOT shave, including legs and underarms, starting the day of your first shower.   You may shave your face at any point before/day of surgery.  Place clean sheets on your bed the day you start using CHG soap. Use a clean washcloth (not used since being washed) for each shower. DO NOT sleep with pets once you start using the CHG.   CHG Shower Instructions:  If you choose to wash your hair and private area, wash first with your normal shampoo/soap.  After you use shampoo/soap, rinse your hair and body thoroughly to remove shampoo/soap residue.   Turn the water OFF and apply about 3 tablespoons (45 ml) of CHG soap to a CLEAN washcloth.  Apply CHG soap ONLY FROM YOUR NECK DOWN TO YOUR TOES (washing for 3-5 minutes)  DO NOT use CHG soap on face, private areas, open wounds, or sores.  Pay special attention to the area where your surgery is being performed.  If you are having back surgery, having someone wash your back for you may be helpful. Wait 2 minutes after CHG soap is applied, then you may rinse off the CHG soap.  Pat dry with a clean towel  Put on clean clothes/pajamas   If you choose to wear lotion, please use ONLY the CHG-compatible lotions on the back of this paper.   Additional instructions for the day of surgery: DO NOT APPLY any lotions, deodorants, cologne, or perfumes.   Do not bring valuables to the hospital. [facility] is not responsible for any belongings/valuables. Do not wear nail polish, gel polish, artificial nails, or any other type of covering on natural nails (fingers and toes) Do not wear jewelry or makeup Put on clean/comfortable clothes.  Please brush your teeth.  Ask your nurse before applying any prescription medications to the skin.     CHG Compatible Lotions   Aveeno Moisturizing lotion  Cetaphil Moisturizing Cream  Cetaphil Moisturizing Lotion  Clairol Herbal Essence Moisturizing Lotion, Dry Skin  Clairol Herbal Essence Moisturizing Lotion, Extra Dry Skin  Clairol Herbal Essence Moisturizing Lotion, Normal Skin  Curel Age Defying Therapeutic Moisturizing Lotion with Alpha Hydroxy  Curel Extreme Care Body Lotion  Curel Soothing Hands Moisturizing Hand Lotion  Curel Therapeutic Moisturizing Cream, Fragrance-Free  Curel Therapeutic Moisturizing Lotion, Fragrance-Free  Curel Therapeutic Moisturizing Lotion, Original Formula  Eucerin Daily Replenishing Lotion  Eucerin Dry Skin Therapy Plus Alpha Hydroxy Crme  Eucerin Dry Skin Therapy Plus Alpha Hydroxy Lotion  Eucerin Original Crme   Eucerin Original Lotion  Eucerin Plus Crme Eucerin Plus Lotion  Eucerin TriLipid Replenishing Lotion  Keri Anti-Bacterial Hand Lotion  Keri Deep Conditioning Original Lotion Dry Skin Formula Softly Scented  Keri Deep Conditioning Original Lotion, Fragrance Free Sensitive Skin Formula  Keri Lotion Fast Absorbing Fragrance Free Sensitive Skin Formula  Keri Lotion Fast Absorbing Softly Scented Dry Skin Formula  Keri Original Lotion  Keri Skin Renewal Lotion Keri Silky Smooth Lotion  Keri Silky Smooth Sensitive Skin Lotion  Nivea Body Creamy Conditioning Oil  Nivea Body Extra Enriched Lotion  Nivea Body Original Lotion  Nivea Body Sheer Moisturizing Lotion Nivea Crme  Nivea Skin Firming Lotion  NutraDerm 30 Skin Lotion  NutraDerm Skin Lotion  NutraDerm Therapeutic Skin Cream  NutraDerm Therapeutic Skin Lotion  ProShield Protective Hand Cream  Provon moisturizing lotion  Please read over the following fact sheets that you were given.

## 2022-08-25 ENCOUNTER — Encounter (HOSPITAL_COMMUNITY)
Admission: RE | Admit: 2022-08-25 | Discharge: 2022-08-25 | Disposition: A | Discharge status: Home / Self Care | Payer: Medicare Other | Source: Ambulatory Visit | Attending: Neurosurgery | Admitting: Neurosurgery

## 2022-08-25 ENCOUNTER — Other Ambulatory Visit: Payer: Self-pay

## 2022-08-25 ENCOUNTER — Encounter (HOSPITAL_COMMUNITY): Payer: Self-pay

## 2022-08-25 VITALS — BP 144/87 | HR 61 | Temp 98.3°F | Resp 17 | Ht 69.0 in | Wt 190.8 lb

## 2022-08-25 DIAGNOSIS — M5412 Radiculopathy, cervical region: Secondary | ICD-10-CM | POA: Diagnosis not present

## 2022-08-25 DIAGNOSIS — J449 Chronic obstructive pulmonary disease, unspecified: Secondary | ICD-10-CM | POA: Diagnosis not present

## 2022-08-25 DIAGNOSIS — J439 Emphysema, unspecified: Secondary | ICD-10-CM | POA: Diagnosis not present

## 2022-08-25 DIAGNOSIS — Z7982 Long term (current) use of aspirin: Secondary | ICD-10-CM | POA: Diagnosis not present

## 2022-08-25 DIAGNOSIS — Z79899 Other long term (current) drug therapy: Secondary | ICD-10-CM | POA: Diagnosis not present

## 2022-08-25 DIAGNOSIS — Z85528 Personal history of other malignant neoplasm of kidney: Secondary | ICD-10-CM | POA: Diagnosis not present

## 2022-08-25 DIAGNOSIS — D497 Neoplasm of unspecified behavior of endocrine glands and other parts of nervous system: Secondary | ICD-10-CM | POA: Diagnosis not present

## 2022-08-25 DIAGNOSIS — D4989 Neoplasm of unspecified behavior of other specified sites: Secondary | ICD-10-CM | POA: Diagnosis not present

## 2022-08-25 DIAGNOSIS — M6281 Muscle weakness (generalized): Secondary | ICD-10-CM | POA: Diagnosis not present

## 2022-08-25 DIAGNOSIS — D429 Neoplasm of uncertain behavior of meninges, unspecified: Secondary | ICD-10-CM | POA: Diagnosis not present

## 2022-08-25 DIAGNOSIS — R509 Fever, unspecified: Secondary | ICD-10-CM | POA: Diagnosis not present

## 2022-08-25 DIAGNOSIS — M25511 Pain in right shoulder: Secondary | ICD-10-CM | POA: Diagnosis not present

## 2022-08-25 DIAGNOSIS — M79601 Pain in right arm: Secondary | ICD-10-CM | POA: Diagnosis present

## 2022-08-25 DIAGNOSIS — D447 Neoplasm of uncertain behavior of aortic body and other paraganglia: Secondary | ICD-10-CM | POA: Diagnosis not present

## 2022-08-25 DIAGNOSIS — D3610 Benign neoplasm of peripheral nerves and autonomic nervous system, unspecified: Secondary | ICD-10-CM | POA: Diagnosis not present

## 2022-08-25 DIAGNOSIS — Z974 Presence of external hearing-aid: Secondary | ICD-10-CM | POA: Diagnosis not present

## 2022-08-25 DIAGNOSIS — D334 Benign neoplasm of spinal cord: Secondary | ICD-10-CM | POA: Diagnosis not present

## 2022-08-25 DIAGNOSIS — F419 Anxiety disorder, unspecified: Secondary | ICD-10-CM | POA: Diagnosis not present

## 2022-08-25 DIAGNOSIS — Z905 Acquired absence of kidney: Secondary | ICD-10-CM | POA: Diagnosis not present

## 2022-08-25 DIAGNOSIS — I10 Essential (primary) hypertension: Secondary | ICD-10-CM | POA: Diagnosis not present

## 2022-08-25 DIAGNOSIS — M1990 Unspecified osteoarthritis, unspecified site: Secondary | ICD-10-CM | POA: Diagnosis present

## 2022-08-25 DIAGNOSIS — Z87891 Personal history of nicotine dependence: Secondary | ICD-10-CM | POA: Diagnosis not present

## 2022-08-25 DIAGNOSIS — D421 Neoplasm of uncertain behavior of spinal meninges: Secondary | ICD-10-CM | POA: Diagnosis not present

## 2022-08-25 DIAGNOSIS — D492 Neoplasm of unspecified behavior of bone, soft tissue, and skin: Secondary | ICD-10-CM | POA: Diagnosis present

## 2022-08-25 DIAGNOSIS — J9811 Atelectasis: Secondary | ICD-10-CM | POA: Diagnosis not present

## 2022-08-25 DIAGNOSIS — Z8546 Personal history of malignant neoplasm of prostate: Secondary | ICD-10-CM | POA: Diagnosis not present

## 2022-08-25 DIAGNOSIS — Z01818 Encounter for other preprocedural examination: Secondary | ICD-10-CM

## 2022-08-25 DIAGNOSIS — Z981 Arthrodesis status: Secondary | ICD-10-CM | POA: Diagnosis not present

## 2022-08-25 HISTORY — DX: Emphysema, unspecified: J43.9

## 2022-08-25 LAB — CBC
HCT: 52.8 % — ABNORMAL HIGH (ref 39.0–52.0)
Hemoglobin: 17.4 g/dL — ABNORMAL HIGH (ref 13.0–17.0)
MCH: 28.5 pg (ref 26.0–34.0)
MCHC: 33 g/dL (ref 30.0–36.0)
MCV: 86.4 fL (ref 80.0–100.0)
Platelets: 237 10*3/uL (ref 150–400)
RBC: 6.11 MIL/uL — ABNORMAL HIGH (ref 4.22–5.81)
RDW: 13 % (ref 11.5–15.5)
WBC: 6 10*3/uL (ref 4.0–10.5)
nRBC: 0 % (ref 0.0–0.2)

## 2022-08-25 LAB — BASIC METABOLIC PANEL
Anion gap: 8 (ref 5–15)
BUN: 18 mg/dL (ref 8–23)
CO2: 28 mmol/L (ref 22–32)
Calcium: 9.7 mg/dL (ref 8.9–10.3)
Chloride: 101 mmol/L (ref 98–111)
Creatinine, Ser: 1.39 mg/dL — ABNORMAL HIGH (ref 0.61–1.24)
GFR, Estimated: 53 mL/min — ABNORMAL LOW (ref >60)
Glucose, Bld: 109 mg/dL — ABNORMAL HIGH (ref 70–99)
Potassium: 3.8 mmol/L (ref 3.5–5.1)
Sodium: 137 mmol/L (ref 135–145)

## 2022-08-25 LAB — SURGICAL PCR SCREEN
MRSA, PCR: NEGATIVE
Staphylococcus aureus: NEGATIVE

## 2022-08-25 LAB — TYPE AND SCREEN
ABO/RH(D): O POS
Antibody Screen: NEGATIVE

## 2022-08-25 NOTE — Progress Notes (Signed)
PCP - Dr. Irena Reichmann Cardiologist - denies  PPM/ICD - denies   Chest x-ray - 01/02/19 EKG - 08/25/22 Stress Test - denies ECHO - denies Cardiac Cath - denies  Sleep Study - denies   DM- denies  Blood Thinner Instructions: n/a Aspirin Instructions: Hold 5-7 days. Last dose 6/6  ERAS Protcol - no, NPO   COVID TEST- n/a   Anesthesia review: no  Patient denies shortness of breath, fever, cough and chest pain at PAT appointment   All instructions explained to the patient, with a verbal understanding of the material. Patient agrees to go over the instructions while at home for a better understanding.  The opportunity to ask questions was provided.

## 2022-08-26 ENCOUNTER — Inpatient Hospital Stay (HOSPITAL_COMMUNITY): Payer: Medicare Other

## 2022-08-26 ENCOUNTER — Other Ambulatory Visit: Payer: Self-pay

## 2022-08-26 ENCOUNTER — Encounter (HOSPITAL_COMMUNITY): Payer: Self-pay | Admitting: Neurosurgery

## 2022-08-26 ENCOUNTER — Inpatient Hospital Stay (HOSPITAL_COMMUNITY)
Admission: RE | Admit: 2022-08-26 | Discharge: 2022-09-01 | DRG: 473 | Disposition: A | Discharge status: Home Health | Payer: Medicare Other | Attending: Neurosurgery | Admitting: Neurosurgery

## 2022-08-26 ENCOUNTER — Inpatient Hospital Stay (HOSPITAL_COMMUNITY)
Admission: RE | Disposition: A | Discharge status: Home / Self Care | Payer: Self-pay | Source: Home / Self Care | Attending: Neurosurgery

## 2022-08-26 ENCOUNTER — Inpatient Hospital Stay (HOSPITAL_COMMUNITY): Payer: Medicare Other | Admitting: Certified Registered"

## 2022-08-26 DIAGNOSIS — Z905 Acquired absence of kidney: Secondary | ICD-10-CM | POA: Diagnosis not present

## 2022-08-26 DIAGNOSIS — Z85528 Personal history of other malignant neoplasm of kidney: Secondary | ICD-10-CM

## 2022-08-26 DIAGNOSIS — D4989 Neoplasm of unspecified behavior of other specified sites: Secondary | ICD-10-CM | POA: Diagnosis not present

## 2022-08-26 DIAGNOSIS — Z974 Presence of external hearing-aid: Secondary | ICD-10-CM

## 2022-08-26 DIAGNOSIS — I10 Essential (primary) hypertension: Secondary | ICD-10-CM | POA: Diagnosis present

## 2022-08-26 DIAGNOSIS — Z87891 Personal history of nicotine dependence: Secondary | ICD-10-CM | POA: Diagnosis not present

## 2022-08-26 DIAGNOSIS — Z79899 Other long term (current) drug therapy: Secondary | ICD-10-CM | POA: Diagnosis not present

## 2022-08-26 DIAGNOSIS — J449 Chronic obstructive pulmonary disease, unspecified: Secondary | ICD-10-CM

## 2022-08-26 DIAGNOSIS — D3610 Benign neoplasm of peripheral nerves and autonomic nervous system, unspecified: Principal | ICD-10-CM | POA: Diagnosis present

## 2022-08-26 DIAGNOSIS — Z981 Arthrodesis status: Secondary | ICD-10-CM | POA: Diagnosis not present

## 2022-08-26 DIAGNOSIS — D421 Neoplasm of uncertain behavior of spinal meninges: Secondary | ICD-10-CM | POA: Diagnosis present

## 2022-08-26 DIAGNOSIS — J439 Emphysema, unspecified: Secondary | ICD-10-CM | POA: Diagnosis not present

## 2022-08-26 DIAGNOSIS — M5412 Radiculopathy, cervical region: Secondary | ICD-10-CM | POA: Diagnosis present

## 2022-08-26 DIAGNOSIS — Z01818 Encounter for other preprocedural examination: Secondary | ICD-10-CM

## 2022-08-26 DIAGNOSIS — Z8546 Personal history of malignant neoplasm of prostate: Secondary | ICD-10-CM | POA: Diagnosis not present

## 2022-08-26 DIAGNOSIS — D429 Neoplasm of uncertain behavior of meninges, unspecified: Secondary | ICD-10-CM | POA: Diagnosis not present

## 2022-08-26 DIAGNOSIS — M6281 Muscle weakness (generalized): Secondary | ICD-10-CM | POA: Diagnosis not present

## 2022-08-26 DIAGNOSIS — J9811 Atelectasis: Secondary | ICD-10-CM | POA: Diagnosis not present

## 2022-08-26 DIAGNOSIS — M1990 Unspecified osteoarthritis, unspecified site: Secondary | ICD-10-CM | POA: Diagnosis present

## 2022-08-26 DIAGNOSIS — D447 Neoplasm of uncertain behavior of aortic body and other paraganglia: Secondary | ICD-10-CM | POA: Diagnosis not present

## 2022-08-26 DIAGNOSIS — M79601 Pain in right arm: Secondary | ICD-10-CM | POA: Diagnosis present

## 2022-08-26 DIAGNOSIS — D492 Neoplasm of unspecified behavior of bone, soft tissue, and skin: Secondary | ICD-10-CM | POA: Diagnosis present

## 2022-08-26 DIAGNOSIS — M25511 Pain in right shoulder: Secondary | ICD-10-CM | POA: Diagnosis not present

## 2022-08-26 DIAGNOSIS — R509 Fever, unspecified: Secondary | ICD-10-CM | POA: Diagnosis not present

## 2022-08-26 DIAGNOSIS — D334 Benign neoplasm of spinal cord: Secondary | ICD-10-CM | POA: Diagnosis not present

## 2022-08-26 DIAGNOSIS — D497 Neoplasm of unspecified behavior of endocrine glands and other parts of nervous system: Secondary | ICD-10-CM | POA: Diagnosis not present

## 2022-08-26 DIAGNOSIS — F419 Anxiety disorder, unspecified: Secondary | ICD-10-CM | POA: Diagnosis present

## 2022-08-26 DIAGNOSIS — Z7982 Long term (current) use of aspirin: Secondary | ICD-10-CM | POA: Diagnosis not present

## 2022-08-26 HISTORY — PX: POSTERIOR CERVICAL FUSION/FORAMINOTOMY: SHX5038

## 2022-08-26 SURGERY — POSTERIOR CERVICAL FUSION/FORAMINOTOMY LEVEL 1
Anesthesia: General | Site: Spine Cervical | Laterality: Right

## 2022-08-26 MED ORDER — SODIUM CHLORIDE 0.9 % IV SOLN: 250.0000 mL | INTRAVENOUS | Status: DC

## 2022-08-26 MED ORDER — SENNA 8.6 MG PO TABS
1.0000 | ORAL_TABLET | Freq: Two times a day (BID) | ORAL | Status: DC
  Administered 2022-08-26 – 2022-09-01 (×12): 8.6 mg via ORAL
  Filled 2022-08-26 (×12): qty 1

## 2022-08-26 MED ORDER — ROCURONIUM BROMIDE 10 MG/ML (PF) SYRINGE
PREFILLED_SYRINGE | INTRAVENOUS | Status: DC | PRN
  Administered 2022-08-26: 40 mg via INTRAVENOUS

## 2022-08-26 MED ORDER — LIDOCAINE 2% (20 MG/ML) 5 ML SYRINGE
INTRAMUSCULAR | Status: DC | PRN
  Administered 2022-08-26: 60 mg via INTRAVENOUS

## 2022-08-26 MED ORDER — PROPOFOL 1000 MG/100ML IV EMUL
INTRAVENOUS | Status: AC
  Filled 2022-08-26: qty 200

## 2022-08-26 MED ORDER — FLUTICASONE PROPIONATE 50 MCG/ACT NA SUSP: 1.0000 | Freq: Every day | NASAL | Status: DC | PRN

## 2022-08-26 MED ORDER — DEXAMETHASONE SODIUM PHOSPHATE 10 MG/ML IJ SOLN
INTRAMUSCULAR | Status: DC | PRN
  Administered 2022-08-26: 10 mg via INTRAVENOUS

## 2022-08-26 MED ORDER — PROPOFOL 10 MG/ML IV BOLUS
INTRAVENOUS | Status: DC | PRN
  Administered 2022-08-26: 20 mg via INTRAVENOUS
  Administered 2022-08-26: 130 mg via INTRAVENOUS
  Administered 2022-08-26: 30 mg via INTRAVENOUS

## 2022-08-26 MED ORDER — DOCUSATE SODIUM 100 MG PO CAPS
100.0000 mg | ORAL_CAPSULE | Freq: Two times a day (BID) | ORAL | Status: DC
  Administered 2022-08-26 – 2022-09-01 (×12): 100 mg via ORAL
  Filled 2022-08-26 (×12): qty 1

## 2022-08-26 MED ORDER — SUCCINYLCHOLINE CHLORIDE 200 MG/10ML IV SOSY
PREFILLED_SYRINGE | INTRAVENOUS | Status: AC
  Filled 2022-08-26: qty 10

## 2022-08-26 MED ORDER — CARBOXYMETHYLCELLULOSE SODIUM 0.25 % OP SOLN: OPHTHALMIC | Status: DC | PRN

## 2022-08-26 MED ORDER — CEFAZOLIN SODIUM-DEXTROSE 2-4 GM/100ML-% IV SOLN
2.0000 g | INTRAVENOUS | Status: AC
  Administered 2022-08-26: 2 g via INTRAVENOUS

## 2022-08-26 MED ORDER — PHENYLEPHRINE 80 MCG/ML (10ML) SYRINGE FOR IV PUSH (FOR BLOOD PRESSURE SUPPORT)
PREFILLED_SYRINGE | INTRAVENOUS | Status: AC
  Filled 2022-08-26: qty 10

## 2022-08-26 MED ORDER — ADHERUS DURAL SEALANT
PACK | TOPICAL | Status: DC | PRN
  Administered 2022-08-26: 1 via TOPICAL

## 2022-08-26 MED ORDER — LIDOCAINE 2% (20 MG/ML) 5 ML SYRINGE
INTRAMUSCULAR | Status: AC
  Filled 2022-08-26: qty 5

## 2022-08-26 MED ORDER — SODIUM CHLORIDE 0.9 % IV SOLN
0.1000 ug/kg/min | INTRAVENOUS | Status: DC
  Filled 2022-08-26: qty 2000

## 2022-08-26 MED ORDER — POLYETHYLENE GLYCOL 3350 17 G PO PACK
17.0000 g | PACK | Freq: Every day | ORAL | Status: DC | PRN
  Administered 2022-08-28 – 2022-08-30 (×3): 17 g via ORAL
  Filled 2022-08-26 (×4): qty 1

## 2022-08-26 MED ORDER — THROMBIN 5000 UNITS EX SOLR
CUTANEOUS | Status: AC
  Filled 2022-08-26: qty 5000

## 2022-08-26 MED ORDER — ROCURONIUM BROMIDE 10 MG/ML (PF) SYRINGE
PREFILLED_SYRINGE | INTRAVENOUS | Status: AC
  Filled 2022-08-26: qty 10

## 2022-08-26 MED ORDER — BISACODYL 10 MG RE SUPP
10.0000 mg | Freq: Every day | RECTAL | Status: DC | PRN
  Administered 2022-08-30: 10 mg via RECTAL
  Filled 2022-08-26: qty 1

## 2022-08-26 MED ORDER — OXYCODONE HCL 5 MG PO TABS
10.0000 mg | ORAL_TABLET | ORAL | Status: DC | PRN
  Administered 2022-08-26 – 2022-08-28 (×8): 10 mg via ORAL
  Filled 2022-08-26 (×8): qty 2

## 2022-08-26 MED ORDER — DEXAMETHASONE SODIUM PHOSPHATE 10 MG/ML IJ SOLN
INTRAMUSCULAR | Status: AC
  Filled 2022-08-26: qty 1

## 2022-08-26 MED ORDER — SODIUM CHLORIDE 0.9 % IV SOLN: 0.2000 ug/kg/min | INTRAVENOUS | Status: DC

## 2022-08-26 MED ORDER — PHENOL 1.4 % MT LIQD: 1.0000 | OROMUCOSAL | Status: DC | PRN

## 2022-08-26 MED ORDER — CEFAZOLIN SODIUM-DEXTROSE 2-4 GM/100ML-% IV SOLN
INTRAVENOUS | Status: AC
  Filled 2022-08-26: qty 100

## 2022-08-26 MED ORDER — PROPOFOL 10 MG/ML IV BOLUS
INTRAVENOUS | Status: AC
  Filled 2022-08-26: qty 20

## 2022-08-26 MED ORDER — OXYCODONE HCL 5 MG PO TABS
5.0000 mg | ORAL_TABLET | ORAL | Status: DC | PRN
  Administered 2022-08-27 (×2): 5 mg via ORAL
  Filled 2022-08-26 (×2): qty 1

## 2022-08-26 MED ORDER — FENTANYL CITRATE (PF) 100 MCG/2ML IJ SOLN
INTRAMUSCULAR | Status: AC
  Filled 2022-08-26: qty 2

## 2022-08-26 MED ORDER — ACETAMINOPHEN 500 MG PO TABS
1000.0000 mg | ORAL_TABLET | Freq: Once | ORAL | Status: AC
  Administered 2022-08-26: 1000 mg via ORAL
  Filled 2022-08-26: qty 2

## 2022-08-26 MED ORDER — HYDROMORPHONE HCL 1 MG/ML IJ SOLN
0.5000 mg | INTRAMUSCULAR | Status: DC | PRN
  Administered 2022-08-26 – 2022-08-31 (×7): 0.5 mg via INTRAVENOUS
  Filled 2022-08-26 (×7): qty 0.5

## 2022-08-26 MED ORDER — SODIUM CHLORIDE 0.9% FLUSH
3.0000 mL | Freq: Two times a day (BID) | INTRAVENOUS | Status: DC
  Administered 2022-08-26 – 2022-09-01 (×12): 3 mL via INTRAVENOUS

## 2022-08-26 MED ORDER — PROCHLORPERAZINE MALEATE 10 MG PO TABS: 10.0000 mg | ORAL_TABLET | Freq: Four times a day (QID) | ORAL | Status: DC | PRN

## 2022-08-26 MED ORDER — LOSARTAN POTASSIUM-HCTZ 100-25 MG PO TABS: 1.0000 | ORAL_TABLET | Freq: Every day | ORAL | Status: DC

## 2022-08-26 MED ORDER — EPHEDRINE SULFATE-NACL 50-0.9 MG/10ML-% IV SOSY
PREFILLED_SYRINGE | INTRAVENOUS | Status: DC | PRN
  Administered 2022-08-26 (×2): 10 mg via INTRAVENOUS
  Administered 2022-08-26 (×2): 5 mg via INTRAVENOUS

## 2022-08-26 MED ORDER — LIDOCAINE-EPINEPHRINE 1 %-1:100000 IJ SOLN
INTRAMUSCULAR | Status: AC
  Filled 2022-08-26: qty 1

## 2022-08-26 MED ORDER — CHLORHEXIDINE GLUCONATE CLOTH 2 % EX PADS: 6.0000 | MEDICATED_PAD | Freq: Once | CUTANEOUS | Status: DC

## 2022-08-26 MED ORDER — ACETAMINOPHEN 325 MG PO TABS
650.0000 mg | ORAL_TABLET | ORAL | Status: DC | PRN
  Administered 2022-08-27 – 2022-09-01 (×13): 650 mg via ORAL
  Filled 2022-08-26 (×14): qty 2

## 2022-08-26 MED ORDER — PANTOPRAZOLE SODIUM 40 MG IV SOLR
40.0000 mg | Freq: Every day | INTRAVENOUS | Status: DC
  Administered 2022-08-26 – 2022-08-27 (×2): 40 mg via INTRAVENOUS
  Filled 2022-08-26 (×2): qty 10

## 2022-08-26 MED ORDER — POLYVINYL ALCOHOL 1.4 % OP SOLN
1.0000 [drp] | OPHTHALMIC | Status: DC | PRN
  Administered 2022-08-29 – 2022-09-01 (×2): 1 [drp] via OPHTHALMIC
  Filled 2022-08-26: qty 15

## 2022-08-26 MED ORDER — FENTANYL CITRATE (PF) 250 MCG/5ML IJ SOLN
INTRAMUSCULAR | Status: AC
  Filled 2022-08-26: qty 5

## 2022-08-26 MED ORDER — LOSARTAN POTASSIUM 50 MG PO TABS
100.0000 mg | ORAL_TABLET | Freq: Every day | ORAL | Status: DC
  Administered 2022-08-26 – 2022-09-01 (×7): 100 mg via ORAL
  Filled 2022-08-26 (×7): qty 2

## 2022-08-26 MED ORDER — THROMBIN 5000 UNITS EX SOLR
OROMUCOSAL | Status: DC | PRN
  Administered 2022-08-26 (×3): 5 mL via TOPICAL

## 2022-08-26 MED ORDER — EPHEDRINE 5 MG/ML INJ
INTRAVENOUS | Status: AC
  Filled 2022-08-26: qty 5

## 2022-08-26 MED ORDER — CHLORHEXIDINE GLUCONATE 0.12 % MT SOLN
OROMUCOSAL | Status: AC
  Filled 2022-08-26: qty 15

## 2022-08-26 MED ORDER — PHENYLEPHRINE 80 MCG/ML (10ML) SYRINGE FOR IV PUSH (FOR BLOOD PRESSURE SUPPORT)
PREFILLED_SYRINGE | INTRAVENOUS | Status: DC | PRN
  Administered 2022-08-26 (×2): 80 ug via INTRAVENOUS
  Administered 2022-08-26: 240 ug via INTRAVENOUS

## 2022-08-26 MED ORDER — SODIUM CHLORIDE 0.9 % IV SOLN
0.2000 ug/kg/min | INTRAVENOUS | Status: DC
  Administered 2022-08-26: .2 ug/kg/min via INTRAVENOUS
  Filled 2022-08-26 (×2): qty 2000

## 2022-08-26 MED ORDER — ONDANSETRON HCL 4 MG PO TABS
4.0000 mg | ORAL_TABLET | Freq: Four times a day (QID) | ORAL | Status: DC | PRN
  Filled 2022-08-26: qty 1

## 2022-08-26 MED ORDER — PRAVASTATIN SODIUM 40 MG PO TABS
40.0000 mg | ORAL_TABLET | Freq: Every day | ORAL | Status: DC
  Administered 2022-08-27 – 2022-09-01 (×6): 40 mg via ORAL
  Filled 2022-08-26 (×6): qty 1

## 2022-08-26 MED ORDER — FENTANYL CITRATE (PF) 250 MCG/5ML IJ SOLN
INTRAMUSCULAR | Status: DC | PRN
  Administered 2022-08-26 (×2): 50 ug via INTRAVENOUS
  Administered 2022-08-26: 25 ug via INTRAVENOUS
  Administered 2022-08-26 (×2): 50 ug via INTRAVENOUS
  Administered 2022-08-26: 25 ug via INTRAVENOUS

## 2022-08-26 MED ORDER — THROMBIN 20000 UNITS EX SOLR
CUTANEOUS | Status: AC
  Filled 2022-08-26: qty 20000

## 2022-08-26 MED ORDER — BUPIVACAINE HCL (PF) 0.5 % IJ SOLN
INTRAMUSCULAR | Status: AC
  Filled 2022-08-26: qty 30

## 2022-08-26 MED ORDER — GABAPENTIN 300 MG PO CAPS
300.0000 mg | ORAL_CAPSULE | Freq: Three times a day (TID) | ORAL | Status: DC
  Administered 2022-08-26 – 2022-09-01 (×17): 300 mg via ORAL
  Filled 2022-08-26 (×18): qty 1

## 2022-08-26 MED ORDER — BACITRACIN ZINC 500 UNIT/GM EX OINT
TOPICAL_OINTMENT | CUTANEOUS | Status: DC | PRN
  Administered 2022-08-26: 1 via TOPICAL

## 2022-08-26 MED ORDER — 0.9 % SODIUM CHLORIDE (POUR BTL) OPTIME
TOPICAL | Status: DC | PRN
  Administered 2022-08-26: 1000 mL

## 2022-08-26 MED ORDER — ACETAMINOPHEN 650 MG RE SUPP: 650.0000 mg | RECTAL | Status: DC | PRN

## 2022-08-26 MED ORDER — MENTHOL 3 MG MT LOZG: 1.0000 | LOZENGE | OROMUCOSAL | Status: DC | PRN

## 2022-08-26 MED ORDER — ONDANSETRON HCL 4 MG/2ML IJ SOLN
INTRAMUSCULAR | Status: DC | PRN
  Administered 2022-08-26: 4 mg via INTRAVENOUS

## 2022-08-26 MED ORDER — LIDOCAINE-EPINEPHRINE 1 %-1:100000 IJ SOLN
INTRAMUSCULAR | Status: DC | PRN
  Administered 2022-08-26: 5 mL

## 2022-08-26 MED ORDER — ONDANSETRON HCL 4 MG/2ML IJ SOLN
INTRAMUSCULAR | Status: AC
  Filled 2022-08-26: qty 2

## 2022-08-26 MED ORDER — LACTATED RINGERS IV SOLN: INTRAVENOUS | Status: DC

## 2022-08-26 MED ORDER — ORAL CARE MOUTH RINSE: 15.0000 mL | Freq: Once | OROMUCOSAL | Status: AC

## 2022-08-26 MED ORDER — HYDROCHLOROTHIAZIDE 25 MG PO TABS
25.0000 mg | ORAL_TABLET | Freq: Every day | ORAL | Status: DC
  Administered 2022-08-26 – 2022-09-01 (×7): 25 mg via ORAL
  Filled 2022-08-26 (×7): qty 1

## 2022-08-26 MED ORDER — FENTANYL CITRATE (PF) 100 MCG/2ML IJ SOLN
25.0000 ug | INTRAMUSCULAR | Status: DC | PRN
  Administered 2022-08-26 (×2): 50 ug via INTRAVENOUS

## 2022-08-26 MED ORDER — METHOCARBAMOL 500 MG PO TABS
500.0000 mg | ORAL_TABLET | Freq: Four times a day (QID) | ORAL | Status: DC | PRN
  Administered 2022-08-27 (×2): 500 mg via ORAL
  Filled 2022-08-26 (×2): qty 1

## 2022-08-26 MED ORDER — METHOCARBAMOL 1000 MG/10ML IJ SOLN: 500.0000 mg | Freq: Four times a day (QID) | INTRAVENOUS | Status: DC | PRN

## 2022-08-26 MED ORDER — BUSPIRONE HCL 10 MG PO TABS
5.0000 mg | ORAL_TABLET | Freq: Two times a day (BID) | ORAL | Status: DC | PRN
  Administered 2022-08-27 – 2022-09-01 (×4): 5 mg via ORAL
  Filled 2022-08-26 (×4): qty 1

## 2022-08-26 MED ORDER — CHLORHEXIDINE GLUCONATE 0.12 % MT SOLN
15.0000 mL | Freq: Once | OROMUCOSAL | Status: AC
  Administered 2022-08-26: 15 mL via OROMUCOSAL

## 2022-08-26 MED ORDER — ALBUMIN HUMAN 5 % IV SOLN: INTRAVENOUS | Status: DC | PRN

## 2022-08-26 MED ORDER — LACTATED RINGERS IV SOLN: INTRAVENOUS | Status: DC | PRN

## 2022-08-26 MED ORDER — CEFAZOLIN SODIUM-DEXTROSE 2-4 GM/100ML-% IV SOLN
2.0000 g | Freq: Three times a day (TID) | INTRAVENOUS | Status: AC
  Administered 2022-08-26 – 2022-08-27 (×2): 2 g via INTRAVENOUS
  Filled 2022-08-26 (×2): qty 100

## 2022-08-26 MED ORDER — PROPOFOL 500 MG/50ML IV EMUL
INTRAVENOUS | Status: DC | PRN
  Administered 2022-08-26: 150 ug/kg/min via INTRAVENOUS

## 2022-08-26 MED ORDER — BACITRACIN ZINC 500 UNIT/GM EX OINT
TOPICAL_OINTMENT | CUTANEOUS | Status: AC
  Filled 2022-08-26: qty 28.35

## 2022-08-26 MED ORDER — SODIUM CHLORIDE 0.9% FLUSH: 3.0000 mL | INTRAVENOUS | Status: DC | PRN

## 2022-08-26 MED ORDER — BUPIVACAINE HCL (PF) 0.5 % IJ SOLN
INTRAMUSCULAR | Status: DC | PRN
  Administered 2022-08-26: 5 mL

## 2022-08-26 MED ORDER — ONDANSETRON HCL 4 MG/2ML IJ SOLN: 4.0000 mg | Freq: Four times a day (QID) | INTRAMUSCULAR | Status: DC | PRN

## 2022-08-26 MED ORDER — FLEET ENEMA 7-19 GM/118ML RE ENEM
1.0000 | ENEMA | Freq: Once | RECTAL | Status: AC | PRN
  Administered 2022-09-01: 1 via RECTAL
  Filled 2022-08-26: qty 1

## 2022-08-26 MED ORDER — PHENYLEPHRINE HCL-NACL 20-0.9 MG/250ML-% IV SOLN
INTRAVENOUS | Status: DC | PRN
  Administered 2022-08-26: 40 ug/min via INTRAVENOUS

## 2022-08-26 MED ORDER — SODIUM CHLORIDE 0.9 % IV SOLN: INTRAVENOUS | Status: DC

## 2022-08-26 MED ORDER — THROMBIN 20000 UNITS EX SOLR: CUTANEOUS | Status: DC | PRN

## 2022-08-26 SURGICAL SUPPLY — 74 items
ADH SKN CLS APL DERMABOND .7 (GAUZE/BANDAGES/DRESSINGS) ×2
APL SKNCLS STERI-STRIP NONHPOA (GAUZE/BANDAGES/DRESSINGS)
BAG COUNTER SPONGE SURGICOUNT (BAG) ×3 IMPLANT
BAG SPNG CNTER NS LX DISP (BAG) ×2
BENZOIN TINCTURE PRP APPL 2/3 (GAUZE/BANDAGES/DRESSINGS) IMPLANT
BLADE CLIPPER SURG (BLADE) ×3 IMPLANT
BLADE ULTRA TIP 2M (BLADE) IMPLANT
BUR 15 MATCH 2.2 (BUR) IMPLANT
BUR MATCHSTICK NEURO 3.0 LAGG (BURR) ×3 IMPLANT
BUR PRECISION FLUTE 5.0 (BURR) IMPLANT
BURR 15 MATCH 2.2 (BUR) ×2
CANISTER SUCT 3000ML PPV (MISCELLANEOUS) ×3 IMPLANT
COVERAGE SUPPORT O-ARM STEALTH (MISCELLANEOUS) ×2 IMPLANT
DERMABOND ADVANCED .7 DNX12 (GAUZE/BANDAGES/DRESSINGS) IMPLANT
DRAPE C-ARM 42X72 X-RAY (DRAPES) ×6 IMPLANT
DRAPE LAPAROTOMY 100X72 PEDS (DRAPES) ×3 IMPLANT
DRAPE SHEET LG 3/4 BI-LAMINATE (DRAPES) ×12 IMPLANT
DRSG OPSITE POSTOP 4X8 (GAUZE/BANDAGES/DRESSINGS) IMPLANT
DURAPREP 6ML APPLICATOR 50/CS (WOUND CARE) ×3 IMPLANT
ELECT REM PT RETURN 9FT ADLT (ELECTROSURGICAL) ×2
ELECTRODE REM PT RTRN 9FT ADLT (ELECTROSURGICAL) ×3 IMPLANT
FEE COVERAGE SUPPORT O-ARM (MISCELLANEOUS) ×3 IMPLANT
FEE INTRAOP CADWELL SUPPLY NCS (MISCELLANEOUS) IMPLANT
FEE INTRAOP MONITOR IMPULS NCS (MISCELLANEOUS) IMPLANT
GAUZE 4X4 16PLY ~~LOC~~+RFID DBL (SPONGE) IMPLANT
GAUZE SPONGE 4X4 12PLY STRL (GAUZE/BANDAGES/DRESSINGS) IMPLANT
GLOVE BIOGEL PI IND STRL 7.5 (GLOVE) ×6 IMPLANT
GLOVE ECLIPSE 7.0 STRL STRAW (GLOVE) ×3 IMPLANT
GLOVE EXAM NITRILE XL STR (GLOVE) IMPLANT
GOWN STRL REUS W/ TWL LRG LVL3 (GOWN DISPOSABLE) IMPLANT
GOWN STRL REUS W/ TWL XL LVL3 (GOWN DISPOSABLE) ×3 IMPLANT
GOWN STRL REUS W/TWL 2XL LVL3 (GOWN DISPOSABLE) IMPLANT
GOWN STRL REUS W/TWL LRG LVL3 (GOWN DISPOSABLE)
GOWN STRL REUS W/TWL XL LVL3 (GOWN DISPOSABLE) ×2
GRAFT BN 5X1XSPNE CVD POST DBM (Bone Implant) IMPLANT
GRAFT BONE MAGNIFUSE 1X5CM (Bone Implant) ×2 IMPLANT
GRAFT DURAGEN MATRIX 1WX1L (Tissue) IMPLANT
HEMOSTAT POWDER KIT SURGIFOAM (HEMOSTASIS) ×3 IMPLANT
INTRAOP CADWELL SUPPLY FEE NCS (MISCELLANEOUS) ×2
INTRAOP DISP SUPPLY FEE NCS (MISCELLANEOUS) ×2
INTRAOP MONITOR FEE IMPULS NCS (MISCELLANEOUS) ×2
KIT BASIN OR (CUSTOM PROCEDURE TRAY) ×3 IMPLANT
KIT TURNOVER KIT B (KITS) ×3 IMPLANT
MARKER SPHERE PSV REFLC NDI (MISCELLANEOUS) ×15 IMPLANT
NEEDLE HYPO 22GX1.5 SAFETY (NEEDLE) ×3 IMPLANT
NS IRRIG 1000ML POUR BTL (IV SOLUTION) ×3 IMPLANT
PACK LAMINECTOMY NEURO (CUSTOM PROCEDURE TRAY) ×3 IMPLANT
PAD ARMBOARD 7.5X6 YLW CONV (MISCELLANEOUS) ×9 IMPLANT
PIN MAYFIELD SKULL DISP (PIN) ×3 IMPLANT
PROBE MONO 100X0.75X1.9 NCS (MISCELLANEOUS) IMPLANT
ROD PRE-CUT 3.5X30 (Rod) IMPLANT
SCREW MA PED IFIX 4.5X18 (Screw) IMPLANT
SCREW MULTI AX 25X5.5 TRNS (Screw) IMPLANT
SCREW MULTI AXIAL 5.5X25 (Screw) ×4 IMPLANT
SET SCREW INFINITY IFIX THOR (Screw) IMPLANT
SOL ELECTROSURG ANTI STICK (MISCELLANEOUS) ×2
SOLUTION ELECTROSURG ANTI STCK (MISCELLANEOUS) ×3 IMPLANT
SPIKE FLUID TRANSFER (MISCELLANEOUS) ×3 IMPLANT
SPONGE SURGIFOAM ABS GEL 100 (HEMOSTASIS) IMPLANT
SPONGE T-LAP 4X18 ~~LOC~~+RFID (SPONGE) IMPLANT
STAPLER VISISTAT 35W (STAPLE) ×3 IMPLANT
STRIP CLOSURE SKIN 1/2X4 (GAUZE/BANDAGES/DRESSINGS) IMPLANT
SUT ETHILON 3 0 FSL (SUTURE) IMPLANT
SUT NURALON 4 0 TF (SUTURE) IMPLANT
SUT VIC AB 0 CT1 18XCR BRD8 (SUTURE) ×3 IMPLANT
SUT VIC AB 0 CT1 8-18 (SUTURE) ×2
SUT VIC AB 2-0 CT1 18 (SUTURE) IMPLANT
SUT VICRYL 3-0 RB1 18 ABS (SUTURE) IMPLANT
TIP NONSTICK .5MMX23CM (INSTRUMENTS) ×2
TIP NONSTICK .5X23 (INSTRUMENTS) IMPLANT
TOWEL GREEN STERILE (TOWEL DISPOSABLE) ×3 IMPLANT
TOWEL GREEN STERILE FF (TOWEL DISPOSABLE) ×3 IMPLANT
UNDERPAD 30X36 HEAVY ABSORB (UNDERPADS AND DIAPERS) ×3 IMPLANT
WATER STERILE IRR 1000ML POUR (IV SOLUTION) ×3 IMPLANT

## 2022-08-26 NOTE — Anesthesia Preprocedure Evaluation (Addendum)
Anesthesia Evaluation  Patient identified by MRN, date of birth, ID band Patient awake    Reviewed: Allergy & Precautions, NPO status , Patient's Chart, lab work & pertinent test results  Airway Mallampati: III  TM Distance: >3 FB Neck ROM: Full    Dental no notable dental hx. (+) Teeth Intact, Dental Advisory Given   Pulmonary COPD, former smoker   Pulmonary exam normal breath sounds clear to auscultation       Cardiovascular hypertension, Pt. on medications Normal cardiovascular exam Rhythm:Regular Rate:Normal     Neuro/Psych  PSYCHIATRIC DISORDERS Anxiety     Right arm pain from tumor negative neurological ROS     GI/Hepatic negative GI ROS, Neg liver ROS,,,  Endo/Other  negative endocrine ROS    Renal/GU Renal InsufficiencyRenal diseaseLab Results      Component                Value               Date                      CREATININE               1.39 (H)            08/25/2022                BUN                      18                  08/25/2022                NA                       137                 08/25/2022                K                        3.8                 08/25/2022                CL                       101                 08/25/2022                CO2                      28                  08/25/2022             negative genitourinary   Musculoskeletal  (+) Arthritis ,    Abdominal   Peds  Hematology negative hematology ROS (+) polycythemia   Anesthesia Other Findings   Reproductive/Obstetrics                             Anesthesia Physical Anesthesia Plan  ASA: 3  Anesthesia Plan: General   Post-op Pain Management: Tylenol PO (pre-op)*   Induction: Intravenous  PONV  Risk Score and Plan: 2 and Dexamethasone, Ondansetron and Treatment may vary due to age or medical condition  Airway Management Planned: Oral ETT  Additional Equipment: Arterial  line  Intra-op Plan:   Post-operative Plan: Extubation in OR  Informed Consent: I have reviewed the patients History and Physical, chart, labs and discussed the procedure including the risks, benefits and alternatives for the proposed anesthesia with the patient or authorized representative who has indicated his/her understanding and acceptance.     Dental advisory given  Plan Discussed with: CRNA  Anesthesia Plan Comments:        Anesthesia Quick Evaluation

## 2022-08-26 NOTE — Anesthesia Procedure Notes (Signed)
Procedure Name: Intubation Date/Time: 08/26/2022 12:55 PM  Performed by: Alwyn Ren, CRNAPre-anesthesia Checklist: Patient identified, Emergency Drugs available, Suction available and Patient being monitored Patient Re-evaluated:Patient Re-evaluated prior to induction Oxygen Delivery Method: Circle system utilized Preoxygenation: Pre-oxygenation with 100% oxygen Induction Type: IV induction Ventilation: Two handed mask ventilation required Laryngoscope Size: Glidescope and 4 Grade View: Grade I Tube type: Oral Tube size: 7.5 mm Number of attempts: 1 Airway Equipment and Method: Rigid stylet and Video-laryngoscopy Placement Confirmation: ETT inserted through vocal cords under direct vision, positive ETCO2 and breath sounds checked- equal and bilateral Secured at: 23 cm Tube secured with: Tape Dental Injury: Teeth and Oropharynx as per pre-operative assessment  Difficulty Due To: Difficult Airway- due to reduced neck mobility Comments: 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

## 2022-08-26 NOTE — Anesthesia Procedure Notes (Addendum)
Arterial Line Insertion Start/End6/02/2023 1:25 PM, 08/26/2022 1:30 PM Performed by: Elmer Picker, MD, Alwyn Ren, CRNA, anesthesiologist  Patient location: Pre-op. Preanesthetic checklist: patient identified, IV checked, site marked, risks and benefits discussed, surgical consent, monitors and equipment checked, pre-op evaluation, timeout performed and anesthesia consent Lidocaine 1% used for infiltration Left, radial was placed Catheter size: 20 G Hand hygiene performed  and maximum sterile barriers used   Attempts: 1 Procedure performed using ultrasound guided technique. Ultrasound Notes:anatomy identified, needle tip was noted to be adjacent to the nerve/plexus identified and no ultrasound evidence of intravascular and/or intraneural injection Following insertion, dressing applied. Post procedure assessment: normal and unchanged  Patient tolerated the procedure well with no immediate complications.

## 2022-08-26 NOTE — Progress Notes (Signed)
Orthopedic Tech Progress Note Patient Details:  [name] [birth year] [id number]  Ortho Devices Type of Ortho Device: Soft collar Ortho Device/Splint Interventions: Ordered, Application, Adjustment   Post Interventions Patient Tolerated: Well  Darleen Crocker 08/26/2022, 8:18 PM

## 2022-08-26 NOTE — Transfer of Care (Signed)
Immediate Anesthesia Transfer of Care Note  Patient: [name]  Procedure(s) Performed: CERVICAL SEVEN-THORACIC ONE LAMINOTOMY AND FACETECTOMY FOR RESECTION OF INTRADURAL TUMOR WITH FUSION (Right: Spine Cervical) Application of O-Arm (Right)  Patient Location: PACU  Anesthesia Type:General  Level of Consciousness: lethargic  Airway & Oxygen Therapy: Patient Spontanous Breathing  Post-op Assessment: Report given to RN  Post vital signs: Reviewed and stable  Last Vitals:  Vitals Value Taken Time  BP 120/76 08/26/22 1833  Temp 36.4 C 08/26/22 1833  Pulse 80 08/26/22 1837  Resp 30 08/26/22 1837  SpO2 95 % 08/26/22 1837  Vitals shown include unvalidated device data.  Last Pain:  Vitals:   08/26/22 1055  TempSrc:   PainSc: 5          Complications: No notable events documented.

## 2022-08-26 NOTE — H&P (Signed)
Chief Complaint   Right arm pain  History of Present Illness  [name] is a 77 y.o. male initially presented to the outpatient neurosurgery clinic with severe right-sided shoulder and arm pain thought to be related to multilevel cervical spondylosis.  Patient initially went ACDF C5-6 C6-7 with partial improvement in arm pain.  He continued to have severe right-sided shoulder and primarily scapular pain.  He underwent a second ACDF C3-4 C4-5 with minimal improvement in symptoms.  Further workup included MRI cervical spine with contrast as well as MRI of the shoulder and EMG/NCS.  These indicated chronic C8 radiculopathy with an enhancing dumbbell shaped mass within the right C7-T1 foramen suggestive of schwannoma.  With the patient's severe pain he elected to proceed with surgical resection.   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   Procedure: EXCISION LEFT DUPUYTRENS RING AND SMALL FINGERS;  Surgeon: Wyn Forster., MD;  Location: [facility];  Service: Orthopedics;  Laterality: Left;   ESOPHAGOGASTRODUODENOSCOPY (EGD) WITH PROPOFOL N/A 05/12/2018   Procedure: ESOPHAGOGASTRODUODENOSCOPY (EGD) WITH PROPOFOL;  Surgeon: Carman Ching, MD;  Location: [facility];  Service: Endoscopy;  Laterality: N/A;   HERNIA REPAIR Left 2010   lt ing with lt hydrocele- inguinal   IR IMAGING GUIDED PORT INSERTION  01/20/2019   IR REMOVAL TUN ACCESS W/ PORT W/O FL MOD SED  11/21/2019   ORCHIECTOMY Right 10/29/2018    Procedure: ORCHIECTOMY radical;  Surgeon: Sebastian Ache, MD;  Location: [facility];  Service: Urology;  Laterality: Right;  75 MINS   RADIOACTIVE SEED IMPLANT  2006   hx of prostate   ROBOT ASSISTED LAPAROSCOPIC NEPHRECTOMY Right 06/22/2018   Procedure: XI ROBOTIC ASSISTED LAPAROSCOPIC NEPHRECTOMY;  Surgeon: Sebastian Ache, MD;  Location: [facility];  Service: Urology;  Laterality: Right;  3 HRS   SHOULDER ARTHROSCOPY  01/2012   right    Social History   Social History   Tobacco Use   Smoking status: Former    Types: Cigarettes    Quit date: 06/15/1980    Years since quitting: 42.2   Smokeless tobacco: Never  Vaping Use   Vaping Use: Never used  Substance Use Topics   Alcohol use: Never   Drug use: No    Medications   Prior to Admission medications   Medication Sig Start Date End Date Taking? Authorizing Provider  busPIRone (BUSPAR) 5 MG tablet Take 5 mg by mouth 2 (two) times daily as needed (for anxiety).   Yes [provider]  Carboxymethylcellulose Sodium (THERATEARS OP) Place 1 drop into both eyes as needed (dry eyes).   Yes [provider]  fluticasone (FLONASE) 50 MCG/ACT nasal spray Place 1 spray into both nostrils daily as needed for allergies.    Yes [provider]  losartan-hydrochlorothiazide (HYZAAR) 100-25 MG tablet Take 1 tablet by mouth daily. 08/29/18  Yes [provider]  pravastatin (PRAVACHOL) 40  MG tablet Take 40 mg by mouth daily.   Yes [provider]  traMADol (ULTRAM) 50 MG tablet Take 50 mg by mouth every 6 (six) hours as needed for moderate pain. 08/21/22  Yes [provider]  aspirin EC 81 MG tablet Take 81 mg by mouth daily. Swallow whole.    [provider]  INLYTA 5 MG tablet TAKE 1 TABLET BY MOUTH TWICE DAILY. TAKE 12 HOURS APART WITH WATER, WITH OR WITHOUT FOOD Patient not taking: No sig reported 07/12/19   Benjiman Core, MD  lidocaine-prilocaine (EMLA) cream Apply 1 application topically as  needed. Patient not taking: Reported on 01/18/2020 01/13/19   Benjiman Core, MD  LORazepam (ATIVAN) 1 MG tablet Take 1 tablet (1 mg total) by mouth every 8 (eight) hours. Patient not taking: Reported on 01/18/2020 02/15/19   Benjiman Core, MD  prochlorperazine (COMPAZINE) 10 MG tablet Take 1 tablet (10 mg total) by mouth every 6 (six) hours as needed for nausea or vomiting. Patient not taking: Reported on 01/18/2020 01/13/19   Benjiman Core, MD    Allergies   Allergies  Allergen Reactions   Oxycodone Other (See Comments)    Patient stated,"it keeps me wide awake and I can't sleep."  Pt has taken this medication recently mixed with acetaminophen and that works fine   Septra [Sulfamethoxazole-Trimethoprim] Hives and Itching    Review of Systems  ROS  Neurologic Exam  Awake, alert, oriented Memory and concentration grossly intact Speech fluent, appropriate CN grossly intact Motor exam: Upper Extremities Deltoid Bicep Tricep Grip  Right 5/5 5/5 5/5 5/5  Left 5/5 5/5 5/5 5/5     4-/5 right finger abduction, 4-/5 finger extension  Lower Extremities IP Quad PF DF EHL  Right 5/5 5/5 5/5 5/5 5/5  Left 5/5 5/5 5/5 5/5 5/5   Sensation grossly intact to LT  Imaging  Contrasted MRI cervical spine does not reveal any residual right-sided foraminal stenosis C3-4 through C6-7.  There is an enhancing mass within the right C7-T1 foramen.  Impression  - 77 y.o. male with continued right C8 radiculopathy likely related to enhancing lesion involving the C8 root.  Plan  -Patient has elected to proceed with resection of the C8 lesion with instrumented stabilization C7-T1  I have reviewed the details of the operation and the expected postoperative course and recovery with the patient at length in the office, on the phone, and in the preoperative area today.  We have again discussed the potential risk of significant hand weakness postoperatively in addition to general risks of spine  surgery and anesthesia.  All his questions today were answered.  Patient does wish to proceed as above.   [name], MD [facility]

## 2022-08-26 NOTE — Brief Op Note (Signed)
08/26/2022  6:21 PM  PATIENT:  [name]  77 y.o. male  PRE-OPERATIVE DIAGNOSIS:  SCHWANNOMA OF SPINAL CORD  POST-OPERATIVE DIAGNOSIS:  SCHWANNOMA OF SPINAL CORD  PROCEDURE:  Procedure(s) with comments: CERVICAL SEVEN-THORACIC ONE LAMINOTOMY AND FACETECTOMY FOR RESECTION OF INTRADURAL TUMOR WITH FUSION (Right) - 3C Application of O-Arm (Right)  SURGEON:  Surgeon(s) and Role:    * [name], MD - Primary    * Tressie Stalker, MD - Assisting  PHYSICIAN ASSISTANT:   ASSISTANTS: Dr. Tressie Stalker, MD   ANESTHESIA:   general  EBL:  700 mL   BLOOD ADMINISTERED:none  DRAINS: none   LOCAL MEDICATIONS USED:  BUPIVICAINE  and XYLOCAINE   SPECIMEN:  Source of Specimen:  Cervical intradural tumor  DISPOSITION OF SPECIMEN:  PATHOLOGY  COUNTS:  YES  TOURNIQUET:  * No tourniquets in log *  DICTATION: .Note written in EPIC  PLAN OF CARE: Admit to inpatient   PATIENT DISPOSITION:  PACU - hemodynamically stable.   Delay start of Pharmacological VTE agent (>24hrs) due to surgical blood loss or risk of bleeding: yes

## 2022-08-26 NOTE — Op Note (Signed)
NEUROSURGERY OPERATIVE NOTE   PREOP DIAGNOSIS:  Right C8 intradural extramedullary tumor   POSTOP DIAGNOSIS: Same  PROCEDURE: Right C7-T1 laminotomy with radical facetectomy for resection of intradural extramedullary tumor Posterior non-segmental instrumentation pedicle screw/rod - Medtronic Vertex screws Posterolateral arthrodesis, C7-T1 Use of morcellized bone autograft/allograft Use of intraoperative stereotaxy  Use of intraoperative microscope for microdissection  SURGEON: Dr. [name], MD  ASSISTANT: Dr. Tressie Stalker, MD  ANESTHESIA: General Endotracheal  EBL: 700cc  SPECIMENS: Intradural tumor for permanent pathology  DRAINS: None  COMPLICATIONS: None immediate  CONDITION: Hemodynamically stable to PACU  HISTORY: [name] is a 77 y.o. male who has been followed in the outpatient neurosurgery clinic complaining of right-sided neck and arm pain over the last 6 months.  Initially thought to be related to cervical spondylosis, the patient initially underwent C5-6 ACDF with partial improvement of symptoms.  Unfortunately with residual pain he then underwent C3-4 ACDF without significant improvement.  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  Because of severe pain he elected to proceed with surgical resection and instrumented stabilization.  The risks, benefits, and alternatives to surgery were all reviewed in detail with the patient.  After all his questions were answered informed consent was obtained and witnessed.  PROCEDURE IN DETAIL: The patient was brought to the operating room. After induction of general anesthesia, the patient was positioned on the operative table in the prone position and the Mayfield head holder. All pressure points were meticulously padded. Skin incision was then marked out and prepped and draped in the usual sterile  fashion.  After timeout was conducted, midline skin incision was infiltrated with local anesthetic with epinephrine.  Incision was then made sharply and carried down through the subcutaneous tissue.  The nuchal fascia was identified and divided in the midline.  The large spinous processes at C7, T1, somewhat large spinous process at C6 and smaller at C5 was identified.  Initial lateral x-ray was done to confirm our location at the correct levels.  Further dissection was carried out in the subperiosteal plane to fully identify and expose the C7 and T1 lateral masses.  A spinous process clamp for stereotaxy was then placed on the C7 spinous process.  Intraoperative CT scan was taken and accuracy was confirmed to be excellent.  Utilizing the stereotactic system, entry points for bilateral C7 and T1 pedicle screws were identified, drilled, and tapped.  Sound was used to check the trajectory and was noted to have good circumferential bone.  Seal was used for hemostasis.  4.5 x 20 mm screw was placed at C7 on the left while similar size screw was placed in T1 on the left.  At this point attention was turned to decompression and exposure for the intradural tumor resection.  High-speed drill was used to complete a right C7-T1 laminotomy.  The facet joint on the right was then completely resected.  The C8 nerve root was identified.  The dura over the nerve root sleeve was then incised sharply under the microscope and carried medially.  We did identify a tumor within the foramen, the medial border of which was identified in the lateral aspect of the spinal canal.  Tumor initially was quite red and did appear to be separate from the C8 nerve root which appeared to be displaced superiorly within the foramen.  The number was dissected away from the C8 nerve root, and utilization of intraoperative stimulation did  not reveal any stimulation of the C8 nerve root itself.  We then dissected more medially within the canal.   There did appear to be a small nerve root fascicle traveling towards the C8 foramen to which the tumor was attached.  Again, stimulation did not reveal any responses in the right arm.  This fascicle was then coagulated and divided.  This allowed [place] to mobilize the tumor laterally into the foramen.  It was then truncated and sent for permanent pathology.  Further dissection was carried out to remove the majority of the tumor within the foramen as it moved ventrally.  Once this was done, hemostasis was secured using a combination of bipolar electrocautery and morselized Gelfoam and thrombin.  Inspection of the dural edges revealed that there was no good way to primarily close the dura.  I therefore cover the region with morselized Gelfoam with thrombin and a piece of collagen onlay.  The remaining 2 pedicle screws were then placed at C7 and T1.  The region was then covered with polyethylene glycol sealant.  30 mm pre-bent rod was placed within the screws.  Setscrews were placed and final tightened.  The left lamina and facet complex was then decorticated with a high-speed drill in preparation for posterolateral arthrodesis.  Bone dust and bone harvested during decompression was then placed over the decorticated bone.  5 cm strips of Magnifuse was also placed over the left C7 and T1 lamina and lateral mass.  At this point the self-retaining retractors were removed.  Hemostasis on the muscle edges was secured with bipolar electrocautery.  The wound was then closed in multiple layers using a combination of interrupted 0 and 3-0 Vicryl stitches.  Skin was closed with Dermabond.  At the end of the case all sponge, needle, instrument, and cottonoid counts were correct.  Patient was then transferred to the stretcher and the Mayfield head holder was removed.  He was then extubated and taken to the postanesthesia care unit in stable hemodynamic condition.   [name] Renshaw, MD [facility]

## 2022-08-27 ENCOUNTER — Encounter (HOSPITAL_COMMUNITY): Payer: Self-pay | Admitting: Neurosurgery

## 2022-08-27 LAB — POCT I-STAT 7, (LYTES, BLD GAS, ICA,H+H)
Acid-base deficit: 3 mmol/L — ABNORMAL HIGH (ref 0.0–2.0)
Bicarbonate: 23.6 mmol/L (ref 20.0–28.0)
Calcium, Ion: 1.17 mmol/L (ref 1.15–1.40)
HCT: 41 % (ref 39.0–52.0)
Hemoglobin: 13.9 g/dL (ref 13.0–17.0)
O2 Saturation: 99 %
Patient temperature: 36.1
Potassium: 3.7 mmol/L (ref 3.5–5.1)
Sodium: 140 mmol/L (ref 135–145)
TCO2: 25 mmol/L (ref 22–32)
pCO2 arterial: 45.2 mmHg (ref 32–48)
pH, Arterial: 7.321 — ABNORMAL LOW (ref 7.35–7.45)
pO2, Arterial: 163 mmHg — ABNORMAL HIGH (ref 83–108)

## 2022-08-27 MED FILL — Thrombin For Soln 5000 Unit: CUTANEOUS | Qty: 5000 | Status: AC

## 2022-08-27 NOTE — Anesthesia Postprocedure Evaluation (Signed)
Anesthesia Post Note  Patient: [name]  Procedure(s) Performed: CERVICAL SEVEN-THORACIC ONE LAMINOTOMY AND FACETECTOMY FOR RESECTION OF INTRADURAL TUMOR WITH FUSION (Right: Spine Cervical) Application of O-Arm (Right)     Patient location during evaluation: PACU Anesthesia Type: General Level of consciousness: awake Pain management: pain level controlled Vital Signs Assessment: post-procedure vital signs reviewed and stable Respiratory status: spontaneous breathing, nonlabored ventilation and respiratory function stable Cardiovascular status: blood pressure returned to baseline and stable Postop Assessment: no apparent nausea or vomiting Anesthetic complications: no   No notable events documented.  Last Vitals:  Vitals:   08/26/22 2306 08/27/22 0330  BP: (!) 130/91 111/70  Pulse: (!) 109 95  Resp: 20 16  Temp: 36.8 C 36.6 C  SpO2: 96% 95%    Last Pain:  Vitals:   08/27/22 0629  TempSrc:   PainSc: 7                  Ashvin Adelson P Lenka Zhao

## 2022-08-27 NOTE — Evaluation (Signed)
Physical Therapy Evaluation Patient Details Name: [name] MRN: [id number] DOB: 05-21-1945 Today's Date: 08/27/2022  History of Present Illness  Pt is a 77 y/o M admitted on 08/26/22 for scheduled surgical resection of C7-T1 schwannoma. Pt is s/p C7-T1 laminotomy & facetectomy with fusion. PMH: anxiety, arthritis, HTN, inguinal hernia, metastatic renal cell carcinoma, prostate CA  Clinical Impression  Pt seen for PT evaluation with pt agreeable to tx. Pt reporting significant pain this AM. Pt reports prior to admission he was independent without AD, living with wife in 1 level home with 3 steps with B rails to enter, enjoys golfing & fishing. On this date, pt requires min assist for bed mobility with HOB significantly elevated & use of hospital bed features. Pt is able to transfer STS with supervision & ambulate without AD with supervision, CGA at times 2/2 c/o pain. Pt negotiates 2 steps with 1 rail with min assist. Pt would benefit from acute PT services to practice log rolling for increased ease of bed mobility & stair negotiation prior to d/c home, pt unable to practice further today 2/2 c/o pain.      Recommendations for follow up therapy are one component of a multi-disciplinary discharge planning process, led by the attending physician.  Recommendations may be updated based on patient status, additional functional criteria and insurance authorization.  Follow Up Recommendations       Assistance Recommended at Discharge PRN  Patient can return home with the following  Assistance with cooking/housework;Help with stairs or ramp for entrance    Equipment Recommendations None recommended by PT  Recommendations for Other Services       Functional Status Assessment Patient has had a recent decline in their functional status and demonstrates the ability to make significant improvements in function in a reasonable and predictable amount of time.     Precautions / Restrictions  Precautions Precautions: Fall;Cervical Precaution Booklet Issued: Yes (comment) Required Braces or Orthoses: Cervical Brace Cervical Brace: Soft collar;Other (comment) (don in sitting, can remove in bed, when ambulating to bathroom, and for showering) Restrictions Weight Bearing Restrictions: No      Mobility  Bed Mobility Overal bed mobility: Needs Assistance Bed Mobility: Supine to Sit     Supine to sit: Min assist, HOB elevated     General bed mobility comments: Pt transitions from semi fowler position to sitting EOB with HOB fully elevated, holding to PT's hand PRN, use of bed rails PRN, extra time    Transfers Overall transfer level: Needs assistance Equipment used: None Transfers: Sit to/from Stand Sit to Stand: Supervision                Ambulation/Gait Ambulation/Gait assistance: Min guard, Supervision Gait Distance (Feet): 100 Feet Assistive device: None Gait Pattern/deviations: Decreased stride length Gait velocity: slightly decreased        Stairs Stairs: Yes Stairs assistance: Min assist Stair Management: One rail Left Number of Stairs: 2 (6") General stair comments: Pt negotiates 2 steps with 1 rail with min assist; reviewed need for assistance PRN for stair negotiation as pt unable to look down 2/2 cervical  precautions.  Wheelchair Mobility    Modified Rankin (Stroke Patients Only)       Balance Overall balance assessment: Needs assistance Sitting-balance support: No upper extremity supported, Feet supported Sitting balance-Leahy Scale: Good     Standing balance support: No upper extremity supported, During functional activity Standing balance-Leahy Scale: Good  Pertinent Vitals/Pain Pain Assessment Pain Assessment: 0-10 Pain Score: 7  Pain Location: posterior neck Pain Descriptors / Indicators: Grimacing, Guarding, Throbbing Pain Intervention(s): Monitored during session, Limited  activity within patient's tolerance, Repositioned (notified nurse of pt's c/o pain & request for more pain meds if able.)    Home Living Family/patient expects to be discharged to:: Private residence Living Arrangements: Spouse/significant other Available Help at Discharge: Family;Available 24 hours/day Type of Home: House Home Access: Stairs to enter Entrance Stairs-Rails: Doctor, general practice of Steps: 3   Home Layout: One level Home Equipment: Grab bars - tub/shower;Cane - quad      Prior Function Prior Level of Function : Independent/Modified Independent;Driving             Mobility Comments: denies falls, plays golf & goes fishing ADLs Comments: plays golf, fishes     Hand Dominance   Dominant Hand: Right    Extremity/Trunk Assessment   Upper Extremity Assessment Upper Extremity Assessment: Defer to OT evaluation RUE Deficits / Details: R hand numb along ulnar nerve line; pain when reaching with this UE though pain has been ongoing for months    Lower Extremity Assessment Lower Extremity Assessment: Overall WFL for tasks assessed    Cervical / Trunk Assessment Cervical / Trunk Assessment: Neck Surgery (soft collar donned throughout session)  Communication   Communication: No difficulties  Cognition Arousal/Alertness: Awake/alert Behavior During Therapy: WFL for tasks assessed/performed Overall Cognitive Status: Within Functional Limits for tasks assessed                                 General Comments: very pleasant man        General Comments General comments (skin integrity, edema, etc.): Repositioned pillows/blankets in bed for comfort    Exercises     Assessment/Plan    PT Assessment Patient needs continued PT services  PT Problem List Pain;Decreased activity tolerance;Decreased balance;Decreased mobility       PT Treatment Interventions DME instruction;Therapeutic exercise;Gait training;Balance training;Stair  training;Neuromuscular re-education;Functional mobility training;Patient/family education;Therapeutic activities    PT Goals (Current goals can be found in the Care Plan section)  Acute Rehab PT Goals Patient Stated Goal: decreased pain PT Goal Formulation: With patient Time For Goal Achievement: 09/10/22 Potential to Achieve Goals: Good    Frequency Min 3X/week     Co-evaluation               AM-PAC PT "6 Clicks" Mobility  Outcome Measure Help needed turning from your back to your side while in a flat bed without using bedrails?: A Little Help needed moving from lying on your back to sitting on the side of a flat bed without using bedrails?: A Lot Help needed moving to and from a bed to a chair (including a wheelchair)?: A Little Help needed standing up from a chair using your arms (e.g., wheelchair or bedside chair)?: A Little Help needed to walk in hospital room?: A Little Help needed climbing 3-5 steps with a railing? : A Little 6 Click Score: 17    End of Session Equipment Utilized During Treatment: Cervical collar Activity Tolerance: Patient limited by pain Patient left: in chair;with call bell/phone within reach Nurse Communication: Patient requests pain meds PT Visit Diagnosis: Pain;Difficulty in walking, not elsewhere classified (R26.2) Pain - Right/Left:  (mid) Pain - part of body:  (neck)    Time: 1610-9604 PT Time Calculation (min) (ACUTE ONLY): 20 min  Charges:   PT Evaluation $PT Eval Moderate Complexity: 1 Mod          Aleda Grana, PT, DPT 08/27/22, 12:42 PM   Sandi Mariscal 08/27/2022, 12:40 PM

## 2022-08-27 NOTE — Evaluation (Signed)
Occupational Therapy Evaluation/Discharge Patient Details Name: [name] MRN: [id number] DOB: 1945/10/23 Today's Date: 08/27/2022   History of Present Illness Pt is a 77 y/o male admitted for C7-T1 laminectomy and facetectomy for resection of intradural tumor. PMH: ACDF C3-5, ACDF C5-7, emphysema, HTN, renal cell carcinoma, prostate CA, hernia   Clinical Impression   PTA, pt lives with wife, typically completely independent in all daily tasks and active at baseline though limited by progressive R shoulder pain. Pt presents now with continued R shoulder pain with reaching + incisional pain. Educated re: cervical precautions for ADLs, lifting restrictions and soft collar mgmt. Pt requires Min A for UB/LB ADLs due to R UE pain but able to mobilize without physical assist or need for AD. Anticipate as pain resolves that pt will return quickly to PLOF. Until then,pt reports wife can provide the needed assist at DC. Defer any follow up therapy needs to neurosurgeon if R shoulder pain/impairments continue.       Recommendations for follow up therapy are one component of a multi-disciplinary discharge planning process, led by the attending physician.  Recommendations may be updated based on patient status, additional functional criteria and insurance authorization.   Assistance Recommended at Discharge PRN  Patient can return home with the following A little help with bathing/dressing/bathroom;Assistance with cooking/housework;Assist for transportation    Functional Status Assessment  Patient has had a recent decline in their functional status and demonstrates the ability to make significant improvements in function in a reasonable and predictable amount of time.  Equipment Recommendations  None recommended by OT    Recommendations for Other Services       Precautions / Restrictions Precautions Precautions: Fall;Cervical Precaution Booklet Issued: Yes (comment) Required Braces or  Orthoses: Cervical Brace Cervical Brace: Soft collar Restrictions Weight Bearing Restrictions: No      Mobility Bed Mobility Overal bed mobility: Modified Independent                  Transfers Overall transfer level: Needs assistance Equipment used: None Transfers: Sit to/from Stand Sit to Stand: Supervision           General transfer comment: for safety      Balance Overall balance assessment: Needs assistance Sitting-balance support: No upper extremity supported, Feet supported Sitting balance-Leahy Scale: Good     Standing balance support: No upper extremity supported, During functional activity Standing balance-Leahy Scale: Good                             ADL either performed or assessed with clinical judgement   ADL Overall ADL's : Needs assistance/impaired Eating/Feeding: Independent;Bed level   Grooming: Supervision/safety;Standing;Wash/dry hands   Upper Body Bathing: Minimal assistance   Lower Body Bathing: Minimal assistance   Upper Body Dressing : Minimal assistance   Lower Body Dressing: Minimal assistance   Toilet Transfer: Supervision/safety;Ambulation Toilet Transfer Details (indicate cue type and reason): without AD to/from bathroom Toileting- Clothing Manipulation and Hygiene: Supervision/safety;Sit to/from stand;Sitting/lateral lean Toileting - Clothing Manipulation Details (indicate cue type and reason): able to stand for urination at toilet without assist     Functional mobility during ADLs: Supervision/safety General ADL Comments: Limited by R shoulder pain  though able to manage fairly well; familiar with cervical precautions though provided additional education for ADLs, brace wear/mgmt and where wife can assist at home     Vision Baseline Vision/History: 1 Wears glasses Ability to See in Adequate Light: 0  Adequate Patient Visual Report: No change from baseline Vision Assessment?: No apparent visual deficits      Perception     Praxis      Pertinent Vitals/Pain Pain Assessment Pain Assessment: 0-10 Pain Score: 8  Pain Location: R shoulder when reaching; surgical site Pain Descriptors / Indicators: Grimacing, Guarding, Shooting, Sharp Pain Intervention(s): Monitored during session, Limited activity within patient's tolerance, Premedicated before session, Patient requesting pain meds-RN notified     Hand Dominance Right   Extremity/Trunk Assessment Upper Extremity Assessment Upper Extremity Assessment: RUE deficits/detail RUE Deficits / Details: R hand numb along ulnar nerve line; pain when reaching with this UE though pain has been ongoing for months   Lower Extremity Assessment Lower Extremity Assessment: Overall WFL for tasks assessed   Cervical / Trunk Assessment Cervical / Trunk Assessment: Neck Surgery   Communication Communication Communication: No difficulties   Cognition Arousal/Alertness: Awake/alert Behavior During Therapy: WFL for tasks assessed/performed Overall Cognitive Status: Within Functional Limits for tasks assessed                                       General Comments  Repositioned pillows/blankets in bed for comfort    Exercises     Shoulder Instructions      Home Living Family/patient expects to be discharged to:: Private residence Living Arrangements: Spouse/significant other Available Help at Discharge: Family;Available 24 hours/day Type of Home: House Home Access: Stairs to enter [employer] of Steps: 3 Entrance Stairs-Rails: Right;Left Home Layout: One level     Bathroom Shower/Tub: Tub/shower unit;Walk-in shower   Bathroom Toilet: Standard     Home Equipment: Grab bars - tub/shower          Prior Functioning/Environment Prior Level of Function : Independent/Modified Independent;Driving               ADLs Comments: plays golf, fishes        OT Problem List: Pain;Impaired UE functional use       OT Treatment/Interventions:      OT Goals(Current goals can be found in the care plan section) Acute Rehab OT Goals Patient Stated Goal: pain control OT Goal Formulation: All assessment and education complete, DC therapy  OT Frequency:      Co-evaluation              AM-PAC OT "6 Clicks" Daily Activity     Outcome Measure Help from another person eating meals?: None Help from another person taking care of personal grooming?: None Help from another person toileting, which includes using toliet, bedpan, or urinal?: A Little Help from another person bathing (including washing, rinsing, drying)?: A Little Help from another person to put on and taking off regular upper body clothing?: A Little Help from another person to put on and taking off regular lower body clothing?: A Little 6 Click Score: 20   End of Session Equipment Utilized During Treatment: Cervical collar Nurse Communication: Mobility status;Patient requests pain meds  Activity Tolerance: Patient tolerated treatment well;Patient limited by pain Patient left: in bed;with bed alarm set;with call bell/phone within reach  OT Visit Diagnosis: Pain Pain - Right/Left: Right Pain - part of body: Shoulder                Time: 1610-9604 OT Time Calculation (min): 23 min Charges:  OT General Charges $OT Visit: 1 Visit OT Evaluation $OT Eval Moderate Complexity: 1 Mod  Bradd Canary, OTR/L Acute Rehab Services Office: [phone]   Lorre Munroe 08/27/2022, 10:30 AM

## 2022-08-27 NOTE — Progress Notes (Addendum)
  NEUROSURGERY PROGRESS NOTE   No issues overnight. Pt reports complete resolution of preoperative right arm pain. Reports subjective improvement in right hand strength. Does report significant posterior neck/trap pain.  EXAM:  BP (!) 146/81 (BP Location: Left Arm)   Pulse 97   Temp 97.9 F (36.6 C) (Oral)   Resp 18   Ht 5\' 9"  (1.753 m)   Wt 83.9 kg   SpO2 97%   BMI 27.32 kg/m   Awake, alert, oriented  Speech fluent, appropriate  CN grossly intact  5/5 LUE 5/5 RUE delt/bicep/tricep. 4+/5 finger ext/abduction Wound dressing c/d/i  IMPRESSION:  76 y.o. male POD#1 s/p resection of right C8 intradural tumor, likely schwannoma with resolution of preop pain and improvement in right hand strength  PLAN: - Cont to mobilize as tolerated - Will change analgesic and spasmolytic to scheduled dosing for the next 48 hrs.   [name], MD [facility]

## 2022-08-28 LAB — SURGICAL PATHOLOGY

## 2022-08-28 MED ORDER — PANTOPRAZOLE SODIUM 40 MG PO TBEC
40.0000 mg | DELAYED_RELEASE_TABLET | Freq: Every day | ORAL | Status: DC
  Administered 2022-08-28 – 2022-09-01 (×4): 40 mg via ORAL
  Filled 2022-08-28 (×5): qty 1

## 2022-08-28 MED ORDER — DIAZEPAM 5 MG PO TABS
5.0000 mg | ORAL_TABLET | Freq: Three times a day (TID) | ORAL | Status: DC
  Administered 2022-08-28 – 2022-08-31 (×9): 5 mg via ORAL
  Filled 2022-08-28 (×9): qty 1

## 2022-08-28 MED ORDER — OXYCODONE HCL 5 MG PO TABS
10.0000 mg | ORAL_TABLET | ORAL | Status: DC
  Administered 2022-08-28 – 2022-09-01 (×23): 10 mg via ORAL
  Filled 2022-08-28 (×24): qty 2

## 2022-08-28 NOTE — Progress Notes (Signed)
  NEUROSURGERY PROGRESS NOTE   No issues overnight. Continued posterior neck/trap soreness. Had some N/V this am.  EXAM:  BP 136/82 (BP Location: Right Arm)   Pulse 78   Temp 98.4 F (36.9 C) (Oral)   Resp 20   Ht 5\' 9"  (1.753 m)   Wt 83.9 kg   SpO2 97%   BMI 27.32 kg/m   Awake, alert, oriented  Speech fluent, appropriate  CN grossly intact  5/5 LUE 5/5 RUE delt/bicep/tricep. 4+/5 finger ext/abduction Wound dressing c/d/i  IMPRESSION:  77 y.o. male POD#2 s/p resection of right C8 intradural tumor, likely schwannoma with resolution of preop right arm pain and improvement in right hand strength  PLAN: - Cont to mobilize as tolerated - Cont scheduled Oxy 10 q 3 Hrs and Valium 5 q 8hrs - Can d/c home this weekend when pt feels comfortable   [name], MD [facility]

## 2022-08-28 NOTE — Progress Notes (Signed)
Physical Therapy Treatment Patient Details Name: [name] MRN: [id number] DOB: 03/12/1946 Today's Date: 08/28/2022   History of Present Illness Pt is a 77 y/o M admitted on 08/26/22 for scheduled surgical resection of C7-T1 schwannoma. Pt is s/p C7-T1 laminotomy & facetectomy with fusion. PMH: anxiety, arthritis, HTN, inguinal hernia, metastatic renal cell carcinoma, prostate CA    PT Comments    Pt seen for PT tx with pt reluctantly agreeable 2/2 pain. Pt aware of need to mobilize & motivated to do so, but still with c/o pain despite being premedicated for session. PT reviewed handout with pt, educated him on technique for log rolling if/when pt feels comfortable sleeping in bed at home (vs recliner). Pt endorses nausea, transfers to sitting & with + emesis. Nurse called to room to assess. PT encouraged pt to mobilize with nursing staff as able later today. Pt left in care of nurse.    Recommendations for follow up therapy are one component of a multi-disciplinary discharge planning process, led by the attending physician.  Recommendations may be updated based on patient status, additional functional criteria and insurance authorization.  Follow Up Recommendations       Assistance Recommended at Discharge PRN  Patient can return home with the following Assistance with cooking/housework;Help with stairs or ramp for entrance   Equipment Recommendations  None recommended by PT    Recommendations for Other Services       Precautions / Restrictions Precautions Precautions: Fall;Cervical Required Braces or Orthoses: Cervical Brace Cervical Brace: Soft collar;Other (comment) (don in sitting, can remove in bed, when ambulating to the bathroom, and for showering) Restrictions Weight Bearing Restrictions: No     Mobility  Bed Mobility Overal bed mobility: Needs Assistance Bed Mobility: Supine to Sit, Sit to Supine     Supine to sit: Supervision, HOB elevated Sit to supine:  Supervision, HOB elevated   General bed mobility comments: HOB significantly elevated    Transfers                        Ambulation/Gait                   Stairs             Wheelchair Mobility    Modified Rankin (Stroke Patients Only)       Balance Overall balance assessment: Needs assistance Sitting-balance support: Feet supported, Bilateral upper extremity supported Sitting balance-Leahy Scale: Fair                                      Cognition Arousal/Alertness: Awake/alert Behavior During Therapy: WFL for tasks assessed/performed Overall Cognitive Status: Within Functional Limits for tasks assessed                                          Exercises      General Comments        Pertinent Vitals/Pain Pain Assessment Pain Assessment: Faces Faces Pain Scale: Hurts whole lot Pain Location: posterior neck, HA Pain Descriptors / Indicators: Grimacing, Guarding, Throbbing, Headache Pain Intervention(s): Monitored during session, RN gave pain meds during session (nurse notified)    Home Living  Prior Function            PT Goals (current goals can now be found in the care plan section) Acute Rehab PT Goals Patient Stated Goal: decreased pain PT Goal Formulation: With patient Time For Goal Achievement: 09/10/22 Potential to Achieve Goals: Good Progress towards PT goals: Progressing toward goals    Frequency    Min 3X/week      PT Plan Current plan remains appropriate    Co-evaluation              AM-PAC PT "6 Clicks" Mobility   Outcome Measure  Help needed turning from your back to your side while in a flat bed without using bedrails?: A Little Help needed moving from lying on your back to sitting on the side of a flat bed without using bedrails?: A Lot Help needed moving to and from a bed to a chair (including a wheelchair)?: A Little Help  needed standing up from a chair using your arms (e.g., wheelchair or bedside chair)?: A Little Help needed to walk in hospital room?: A Little Help needed climbing 3-5 steps with a railing? : A Little 6 Click Score: 17    End of Session Equipment Utilized During Treatment: Cervical collar Activity Tolerance:  (limited by vomiting, pain) Patient left: in bed;with nursing/sitter in room Nurse Communication:  (+emesis during session) PT Visit Diagnosis: Pain;Difficulty in walking, not elsewhere classified (R26.2) Pain - Right/Left:  (middle) Pain - part of body:  (neck)     Time: 9562-1308 PT Time Calculation (min) (ACUTE ONLY): 13 min  Charges:  $Therapeutic Activity: 8-22 mins                     Aleda Grana, PT, DPT 08/28/22, 9:07 AM   Sandi Mariscal 08/28/2022, 9:04 AM

## 2022-08-28 NOTE — Plan of Care (Signed)
  Problem: Safety: Goal: Ability to remain free from injury will improve 08/28/2022 0004 by Cottie Banda, Mardelle Matte, RN Outcome: Not Progressing 08/28/2022 0004 by Cottie Banda, Mardelle Matte, RN Outcome: Not Progressing   Problem: Pain Managment: Goal: General experience of comfort will improve 08/28/2022 0004 by Cottie Banda, Mardelle Matte, RN Outcome: Not Progressing 08/28/2022 0004 by Cottie Banda, Mardelle Matte, RN Outcome: Not Progressing   Problem: Elimination: Goal: Will not experience complications related to urinary retention 08/28/2022 0004 by Maryjean Morn, RN Outcome: Not Progressing 08/28/2022 0004 by Cottie Banda, Mardelle Matte, RN Outcome: Not Progressing   Problem: Elimination: Goal: Will not experience complications related to bowel motility 08/28/2022 0004 by Maryjean Morn, RN Outcome: Not Progressing 08/28/2022 0004 by Cottie Banda, Mardelle Matte, RN Outcome: Not Progressing

## 2022-08-28 NOTE — Care Management Important Message (Signed)
Important Message  Patient Details  Name: [name] MRN: [id number] Date of Birth: 03/09/1946   Medicare Important Message Given:  Yes     [name] Stefan Church 08/28/2022, 2:53 PM

## 2022-08-28 NOTE — TOC Initial Note (Signed)
Transition of Care [facility]) - Initial/Assessment Note    Patient Details  Name: [name] MRN: [id number] Date of Birth: May 15, 1945  Transition of Care [facility]) CM/SW Contact:    Kermit Balo, RN Phone Number: 08/28/2022, 3:07 PM  Clinical Narrative:                 Pt is from home with his spouse who is with him most of the time.  No follow up per PT/OT.  Spouse provides needed transportation.  Pt manages his medications and denies any issues.  ToC following for d/c needs.  Expected Discharge Plan: Home/Self Care Barriers to Discharge: Continued Medical Work up   Patient Goals and CMS Choice            Expected Discharge Plan and Services       Living arrangements for the past 2 months: Single Family Home                                      Prior Living Arrangements/Services Living arrangements for the past 2 months: Single Family Home Lives with:: Spouse Patient language and need for interpreter reviewed:: Yes Do you feel safe going back to the place where you live?: Yes        Care giver support system in place?: Yes (comment) Current home services: DME (4 prong cane/) Criminal Activity/Legal Involvement Pertinent to Current Situation/Hospitalization: No - Comment as needed  Activities of Daily Living Home Assistive Devices/Equipment: Eyeglasses, Blood pressure cuff, Hearing aid ADL Screening (condition at time of admission) Patient's cognitive ability adequate to safely complete daily activities?: Yes Is the patient deaf or have difficulty hearing?: Yes Does the patient have difficulty seeing, even when wearing glasses/contacts?: No Does the patient have difficulty concentrating, remembering, or making decisions?: No Patient able to express need for assistance with ADLs?: Yes Does the patient have difficulty dressing or bathing?: No Independently performs ADLs?: Yes (appropriate for developmental age) Does the patient have difficulty walking or  climbing stairs?: No Weakness of Legs: Right Weakness of Arms/Hands: Right  Permission Sought/Granted                  Emotional Assessment Appearance:: Appears stated age Attitude/Demeanor/Rapport: Engaged Affect (typically observed): Accepting Orientation: : Oriented to Self, Oriented to Place, Oriented to  Time, Oriented to Situation   Psych Involvement: No (comment)  Admission diagnosis:  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] Patient Active Problem List   Diagnosis Date Noted   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 [place] [address] Phone: [phone] Fax: [phone]  [facility] [address] [place] - [address] [place] [address] Phone: [phone] Fax: [phone]     Social Determinants of Health (SDOH) Social History: SDOH Screenings   Food Insecurity: No Food Insecurity (08/26/2022)  Housing: Low Risk  (08/26/2022)  Transportation Needs: No Transportation Needs (08/26/2022)  Utilities: Not At Risk (08/26/2022)  Tobacco Use: Medium Risk (08/27/2022)   SDOH Interventions:     Readmission Risk Interventions     No data to display

## 2022-08-29 MED ORDER — TAMSULOSIN HCL 0.4 MG PO CAPS
0.4000 mg | ORAL_CAPSULE | Freq: Every day | ORAL | Status: DC
  Administered 2022-08-29 – 2022-09-01 (×4): 0.4 mg via ORAL
  Filled 2022-08-29 (×4): qty 1

## 2022-08-29 NOTE — Progress Notes (Signed)
Postop day 3.  Patient still with quite a bit of incisional pain and muscular pain in his neck but no significant radiating pain numbness or weakness into his upper extremity.  He is slowly increase his activities but is having great difficulty moving around.  He feels like he needs at least another day in the hospital before considering discharge.  He is awake and alert.  He is oriented and appropriate.  His speech is fluent.  His judgment insight are intact.  Motor examination is stable.  Dressing dry.  Progressing reasonably well following laminectomy foraminotomy with resection of nerve sheath tumor.  Continue efforts at mobilization.  Likely discharge tomorrow.

## 2022-08-29 NOTE — Plan of Care (Signed)
  Problem: Skin Integrity: Goal: Will show signs of wound healing Outcome: Not Progressing   Problem: Pain Management: Goal: Pain level will decrease Outcome: Not Progressing   Problem: Clinical Measurements: Goal: Ability to maintain clinical measurements within normal limits will improve Outcome: Not Progressing Goal: Postoperative complications will be avoided or minimized Outcome: Not Progressing Goal: Diagnostic test results will improve Outcome: Not Progressing

## 2022-08-30 ENCOUNTER — Inpatient Hospital Stay (HOSPITAL_COMMUNITY): Payer: Medicare Other

## 2022-08-30 LAB — URINALYSIS, W/ REFLEX TO CULTURE (INFECTION SUSPECTED)
Bacteria, UA: NONE SEEN
Bilirubin Urine: NEGATIVE
Glucose, UA: NEGATIVE mg/dL
Ketones, ur: NEGATIVE mg/dL
Leukocytes,Ua: NEGATIVE
Nitrite: NEGATIVE
Protein, ur: 30 mg/dL — AB
Specific Gravity, Urine: 1.023 (ref 1.005–1.030)
pH: 5 (ref 5.0–8.0)

## 2022-08-30 NOTE — Progress Notes (Signed)
This RN requested Amber, NT to recheck pt's temperature.  Temperature increased from earlier despite PO tylenol given.  Primary RN Justice Britain notified and to contact neurosurgery regarding this.  Ice packs placed and cool wash cloth placed to head.  Pt sleepy, but easy to arouse.  No drainage noted from incision site.

## 2022-08-30 NOTE — Progress Notes (Signed)
Patient currently struggling with pain control.  Pain confined to his neck and shoulders.  No radicular symptoms.  He is afebrile.  His vital signs are stable.  His wound dressing is dry.  His motor and sensory function stable.  Continue efforts at pain control and mobility.

## 2022-08-30 NOTE — Progress Notes (Signed)
Retention with bladder scan of 773 ml. Has tried to urinate but unsuccesful. Hildred Priest NP notified and order for in and out cath. In and out cath completed with 800 cc urine returned. Per NP if next scan greater than 400cc. Reinsert foley

## 2022-08-31 ENCOUNTER — Encounter (HOSPITAL_COMMUNITY): Payer: Self-pay | Admitting: Neurosurgery

## 2022-08-31 MED ORDER — CHLORHEXIDINE GLUCONATE CLOTH 2 % EX PADS: 6.0000 | MEDICATED_PAD | Freq: Every day | CUTANEOUS | Status: DC

## 2022-08-31 NOTE — Progress Notes (Signed)
  NEUROSURGERY PROGRESS NOTE   No issues overnight. Improved neck pain, does report some leg spasms. No arm/hand pain. Foley introduced yesterday.  EXAM:  BP 106/63 (BP Location: Right Arm)   Pulse 86   Temp 99.7 F (37.6 C) (Oral)   Resp 20   Ht 5\' 9"  (1.753 m)   Wt 83.9 kg   SpO2 91%   BMI 27.32 kg/m   Awake, alert, oriented  Speech fluent, appropriate  CN grossly intact  5/5 BUE/BLE, very mild right hand weakness, stable Wound c/d/i  IMPRESSION:  77 y.o. male POD# 4 s/p resection of C8 lesion, C7-T1 stabilization. Recovering slowly Delayed postop urinary retention, may be narcotic related  PLAN: - Will add flomax - Can remove foley again this pm with trial of void - If voiding, can likely d/c home later today   [name], MD [facility]

## 2022-08-31 NOTE — Progress Notes (Signed)
Mobility Specialist Progress Note   08/31/22 1551  Mobility  Activity Dangled on edge of bed;Stood at bedside  Level of Assistance Moderate assist, patient does 50-74%  Assistive Device Front wheel walker  Activity Response Tolerated well  Mobility Referral Yes  $Mobility charge 1 Mobility  Mobility Specialist Start Time (ACUTE ONLY) 1515  Mobility Specialist Stop Time (ACUTE ONLY) 1552  Mobility Specialist Time Calculation (min) (ACUTE ONLY) 37 min   Received pt in bed slightly more alert but still disoriented. Pt didn't vocalize pain at beginning of session but wincing in pain w/ all movements. Pt also having difficulty initiating own movements and required tactile cues throughout despite verbal cues given. Able to get EOB and stand w/ modA for ~58min, but pt unable to stand fully upright d/t pain. Returned back to bed w/ max assist, placed call bell in reach and notified RN of session.    Frederico Hamman Mobility Specialist Please contact via SecureChat or  Rehab office at [phone]

## 2022-08-31 NOTE — Progress Notes (Addendum)
During the shift change this nurse noted Patient was laying down in the bed shivering with flushing face. He was very warm to touch, VS checked temp was 100.6, PRN tylenol given.  This nurse rechecked the temp after hours it was 103,2. Charge nurse Myriam Jacobson  and [place] neuro surgery on call provider Meyran notified. Provider ordered urine culture and chest x-ray. CXR showed Left Lung base atelectasis. This nurse called to on call provider again, provider said, "I will call hospitalist to follow this up".

## 2022-08-31 NOTE — Progress Notes (Signed)
   08/30/22 2112  Assess: MEWS Score  Temp (!) 103.2 F (39.6 C)  Assess: MEWS Score  MEWS Temp 2  MEWS Systolic 0  MEWS Pulse 0  MEWS RR 0  MEWS LOC 0  MEWS Score 2  MEWS Score Color Yellow  Assess: if the MEWS score is Yellow or Red  Were vital signs taken at a resting state? Yes  Focused Assessment Change from prior assessment (see assessment flowsheet)  Does the patient meet 2 or more of the SIRS criteria? No  MEWS guidelines implemented  Yes, yellow  Treat  MEWS Interventions Considered administering scheduled or prn medications/treatments as ordered  Take Vital Signs  Increase Vital Sign Frequency  Yellow: Q2hr x1, continue Q4hrs until patient remains green for 12hrs  Escalate  MEWS: Escalate Yellow: Discuss with charge nurse and consider notifying provider and/or RRT  Notify: Charge Nurse/RN  Name of Charge Nurse/RN Notified Roselie Awkward, RN  Provider Notification  Provider Name/Title Meyran/NP  Date Provider Notified 08/30/22  Method of Notification Call  Notification Reason Change in status  Provider response See new orders  Date of Provider Response 08/30/22  Assess: SIRS CRITERIA  SIRS Temperature  1  SIRS Pulse 1  SIRS Respirations  0  SIRS WBC 0  SIRS Score Sum  2

## 2022-08-31 NOTE — Progress Notes (Signed)
Mobility Specialist Progress Note   08/31/22 1131  Mobility  Activity Stood at bedside;Dangled on edge of bed  Level of Assistance Moderate assist, patient does 50-74%  Assistive Device Other (Comment) (HHA)  Activity Response Tolerated well  Mobility Referral Yes  $Mobility charge 1 Mobility  Mobility Specialist Start Time (ACUTE ONLY) 1109  Mobility Specialist Stop Time (ACUTE ONLY) 1130  Mobility Specialist Time Calculation (min) (ACUTE ONLY) 21 min   Pt presenting in bed lethargic and difficult to keep alert. Able to get EOB w/ modA and tactile cues to initiate movements. X1 STS attempt but pt unable to fully stand upright w/ HHA, pt expressing their medicine is making them feel "loopy". Returned pt back supine then placed in chair position, call bell in reach, bed alarm on and RN notified.   Frederico Hamman Mobility Specialist Please contact via SecureChat or  Rehab office at [phone]

## 2022-08-31 NOTE — Progress Notes (Signed)
Last tylenol given at 2352 temp at 5 am is 99.2, oxygen 100% in room air, BP WNL. Neuro intact, A/O, Foley intact clear urine in foley bag, urine culture is processing. Patient said, feeling much better then last night.Will continue to monitor.

## 2022-09-01 ENCOUNTER — Inpatient Hospital Stay (HOSPITAL_COMMUNITY): Payer: Medicare Other

## 2022-09-01 MED ORDER — OXYCODONE HCL 10 MG PO TABS: 10.0000 mg | ORAL_TABLET | Freq: Four times a day (QID) | ORAL | 0 refills | Status: AC | PRN

## 2022-09-01 MED ORDER — DIAZEPAM 5 MG PO TABS: 5.0000 mg | ORAL_TABLET | Freq: Three times a day (TID) | ORAL | 0 refills | Status: DC | PRN

## 2022-09-01 NOTE — Discharge Summary (Signed)
Physician Discharge Summary  Patient ID: [name] MRN: [id number] DOB/AGE: [birth year] 77 y.o.  Admit date: 08/26/2022 Discharge date: 09/01/2022  Admission Diagnoses:  Cervical intradural tumor  Discharge Diagnoses:  Same Principal Problem:   Spinal axis tumor   Discharged Condition: Stable  Hospital Course:  [name] is a 77 y.o. male who underwent resection of a right C8 intradural tumor and C7-T1 stabilization. He reports resolution of preop arm pain and had some improvement in hand strength postop. His postoperative incisional pain/muscular soreness slowly improved until he was ambulating with a walker, voiding normally with pain controlled with oral medication. He then requested d/c home.  Treatments: Surgery - Resection C8 schwannoma, C7-T1 stabilization/fusion  Discharge Exam: Blood pressure 125/68, pulse 87, temperature 98.8 F (37.1 C), temperature source Oral, resp. rate 18, height 5\' 9"  (1.753 m), weight 83.9 kg, SpO2 96 %. Awake, alert, oriented Speech fluent, appropriate CN grossly intact 5/5 BUE/BLE Wound c/d/i  Disposition: Discharge disposition: 01-Home or Self Care       Discharge Instructions     Call MD for:  redness, tenderness, or signs of infection (pain, swelling, redness, odor or green/yellow discharge around incision site)   Complete by: As directed    Call MD for:  temperature >100.4   Complete by: As directed    Diet - low sodium heart healthy   Complete by: As directed    Discharge instructions   Complete by: As directed    Walk at home as much as possible, at least 4 times / day   Incentive spirometry RT   Complete by: As directed    Increase activity slowly   Complete by: As directed    Lifting restrictions   Complete by: As directed    No lifting > 10 lbs   May shower / Bathe   Complete by: As directed    48 hours after surgery   May walk up steps   Complete by: As directed    Other Restrictions   Complete  by: As directed    No bending/twisting at waist      Allergies as of 09/01/2022       Reactions   Oxycodone Other (See Comments)   Patient stated,"it keeps me wide awake and I can't sleep." Pt has taken this medication recently mixed with acetaminophen and that works fine   Septra [sulfamethoxazole-trimethoprim] Hives, Itching        Medication List     STOP taking these medications    LORazepam 1 MG tablet Commonly known as: ATIVAN   prochlorperazine 10 MG tablet Commonly known as: COMPAZINE   traMADol 50 MG tablet Commonly known as: ULTRAM       TAKE these medications    aspirin EC 81 MG tablet Take 81 mg by mouth daily. Swallow whole.   busPIRone 5 MG tablet Commonly known as: BUSPAR Take 5 mg by mouth 2 (two) times daily as needed (for anxiety).   diazepam 5 MG tablet Commonly known as: VALIUM Take 1 tablet (5 mg total) by mouth every 8 (eight) hours as needed for anxiety.   fluticasone 50 MCG/ACT nasal spray Commonly known as: FLONASE Place 1 spray into both nostrils daily as needed for allergies.   Inlyta 5 MG tablet Generic drug: axitinib TAKE 1 TABLET BY MOUTH TWICE DAILY. TAKE 12 HOURS APART WITH WATER, WITH OR WITHOUT FOOD   lidocaine-prilocaine cream Commonly known as: EMLA Apply 1 application topically as needed.   losartan-hydrochlorothiazide  100-25 MG tablet Commonly known as: HYZAAR Take 1 tablet by mouth daily.   Oxycodone HCl 10 MG Tabs Take 1 tablet (10 mg total) by mouth every 6 (six) hours as needed for up to 7 days.   pravastatin 40 MG tablet Commonly known as: PRAVACHOL Take 40 mg by mouth daily.   THERATEARS OP Place 1 drop into both eyes as needed (dry eyes).               Durable Medical Equipment  (From admission, onward)           Start     Ordered   09/01/22 1258  DME Walker  Once       Question Answer Comment  Walker: With 5 Inch Wheels   Patient needs a walker to treat with the following condition  Spinal axis tumor      09/01/22 1259   09/01/22 1224  For home use only DME Walker rolling  Once       Question Answer Comment  Walker: With 5 Inch Wheels   Patient needs a walker to treat with the following condition Generalized weakness      09/01/22 1223            Follow-up Information     Care, [facility] Follow up.   Specialty: Home Health Services Why: [facility] will provide PT/OT services at the home.  They will call you to set up services in the next 24-48 hours. Contact information: [address] [place] [address] [phone]         Care, [facility] Follow up.   Why: [employer] will provide a RW to your hospital room before you are discharged home. Contact information: [address] [place] [address] [phone]         [name], MD Follow up in 2 week(s).   Specialty: Neurosurgery Contact information: [address] [place] [address] [phone]                 Signed: Jackelyn Hoehn 09/01/2022, 1:00 PM

## 2022-09-01 NOTE — Progress Notes (Signed)
Transition of Care [facility]) - Inpatient Brief Assessment   Patient Details  Name: [name] MRN: [id number] Date of Birth: [birth year]  [facility]) CM/SW Contact:    Janae Bridgeman, RN Phone Number: 09/01/2022, 10:37 AM   Clinical Narrative: No TOC needs at this time.  Transition of Care Asessment: Insurance and Status: (P) Insurance coverage has been reviewed Patient has primary care physician: (P) Yes Home environment has been reviewed: (P) Yes Prior level of function:: (P) Home with spouse Prior/Current Home Services: (P) No current home services Social Determinants of Health Reivew: (P) SDOH reviewed no interventions necessary Readmission risk has been reviewed: (P) Yes Transition of care needs: (P) no transition of care needs at this time

## 2022-09-01 NOTE — Progress Notes (Signed)
This Clinical research associate entered room virtually to infrom patient and family on rolling walker being delivered to room and options available to patient after waiting for over two hours for it to be delivered. When entering room, patient found to be sitting on side of bed with two techs at bedside. Family informed me that the patient had fallen while in bathroom. One tech went to go get the bedside nurse while the other tech stayed with patient. Patient denies LOC or head strike, states that he hit his right shoulder and is in pain due to that. Tech stated that she found patient on the toilet and patient states that he lost balance and stumbled back onto toilet but did not fall completely to the ground. Bedside nurse at bedside to assess patient. This Clinical research associate called on call neurosurgery line at [phone] to make attending aware of situation given patient had been discharged prior to this event occurring.

## 2022-09-01 NOTE — TOC Progression Note (Signed)
Transition of Care [name]) - Progression Note    Patient Details  Name: [name] MRN: [id number] Date of Birth: Nov 07, 1945  Transition of Care [facility]) CM/SW Contact  Janae Bridgeman, RN Phone Number: 09/01/2022, 12:19 PM  Clinical Narrative:    CM met with the patient and spouse at the bedside to discuss TOC to return home with home health services and DME needs.  The patient has a soft cervical collar in place at this time and was seen by PT - recommending HH services.  I offered Medicare choice regarding home health and patient/wife did not have a preference.  I called Zollie Scale and Kandee Keen, RNCM accepted for [facility] PT/OT - orders placed.  The patient did not have preference for DME company for RW - [employer] called to deliver a RW to the patient's room prior to discharge to home.  The patient 's wife will be providing transportation to home via car when patient is medically safe to discharge.   Expected Discharge Plan: Home/Self Care Barriers to Discharge: Continued Medical Work up  Expected Discharge Plan and Services     Post Acute Care Choice: Home Health, Durable Medical Equipment Living arrangements for the past 2 months: Single Family Home                   DME Agency: [employer] Date DME Agency Contacted: 09/01/22 Time DME Agency Contacted: 1218 Representative spoke with at DME Agency: [employer] to deliver a RW to the bedside prior to discharge to home HH Arranged: PT, OT HH Agency: [facility] Date [facility] Agency Contacted: 09/01/22 Time HH Agency Contacted: 1218 Representative spoke with at [facility] Agency: Kandee Keen, RNCM with [facility]   Social Determinants of Health (SDOH) Interventions SDOH Screenings   Food Insecurity: No Food Insecurity (08/26/2022)  Housing: Low Risk  (08/26/2022)  Transportation Needs: No Transportation Needs (08/26/2022)  Utilities: Not At Risk (08/26/2022)  Tobacco Use: Medium Risk (08/31/2022)    Readmission Risk  Interventions     No data to display

## 2022-09-01 NOTE — Progress Notes (Signed)
Received call from On call Neurosurgeon Patrici Ranks. Informed her or fall. Ordered shoulder x-ray. If negative provider wants to send pt home.

## 2022-09-01 NOTE — Progress Notes (Signed)
Physical Therapy Treatment Patient Details Name: [name] MRN: [id number] DOB: Oct 07, 1945 Today's Date: 09/01/2022   History of Present Illness Pt is a 77 y/o M admitted on 08/26/22 for scheduled surgical resection of C7-T1 schwannoma. Pt is s/p C7-T1 laminotomy & facetectomy with fusion. PMH: anxiety, arthritis, HTN, inguinal hernia, metastatic renal cell carcinoma, prostate CA    PT Comments    Pt with stagnation of progress today, limited by pain. Session focused on recall of precautions, bed mobility, transfers, gait and stair training, and AD management. Pt able to follow precautions with bed mobility, needing verbal cueing for sequencing. Amb to bathroom w 1x person HHA, presents with uncoordinated movement LLE>RLE and minor LOB. PT introduced RW with improved gait speed, step length, and balance. Amb 100' and completes stairs and educated on precautions again to return to supine. Discussed benefits of RW and recommendation of HHPT with patient and family who were agreeable. Pt will continue to benefit from skilled PT during their acute admission to facilitate improvements in the listed impairments.   Recommendations for follow up therapy are one component of a multi-disciplinary discharge planning process, led by the attending physician.  Recommendations may be updated based on patient status, additional functional criteria and insurance authorization.  Follow Up Recommendations       Assistance Recommended at Discharge Set up Supervision/Assistance  Patient can return home with the following Help with stairs or ramp for entrance;Assistance with cooking/housework;A little help with walking and/or transfers   Equipment Recommendations  Rolling walker (2 wheels)    Recommendations for Other Services       Precautions / Restrictions Precautions Precautions: Fall;Cervical Precaution Booklet Issued: Yes (comment) Required Braces or Orthoses: Cervical Brace Cervical Brace: Soft  collar;Other (comment) Restrictions Weight Bearing Restrictions: No     Mobility  Bed Mobility Overal bed mobility: Needs Assistance Bed Mobility: Supine to Sit, Sit to Supine     Supine to sit: Min guard, HOB elevated Sit to supine: Min assist, HOB elevated   General bed mobility comments: Supine<>sit with cues for log roll technique, increased time, use of bed rail. Sit<>supine needs therapist assist to bring BLE back into bed min A.    Transfers Overall transfer level: Needs assistance Equipment used: None Transfers: Sit to/from Stand Sit to Stand: Supervision           General transfer comment: for safety    Ambulation/Gait Ambulation/Gait assistance: Min guard, Supervision Gait Distance (Feet): 100 Feet Assistive device: None, Rolling walker (2 wheels) Gait Pattern/deviations: Step-through pattern, Decreased stride length Gait velocity: slightly decreased Gait velocity interpretation: 1.31 - 2.62 ft/sec, indicative of limited community ambulator   General Gait Details: Initially with 1x HHA but presents with uncoordinated gait pattern, LLE<RLE. Minor LOB while amb towards bathroom. RW introduced and pt amb with increased gait speed, balance, more coordinated steps, and decreased pain.   Stairs Stairs: Yes Stairs assistance: Min assist Stair Management: Two rails, Alternating pattern, Step to pattern Number of Stairs: 4 General stair comments: Alternating on ascent, step to on descent. Difficulty with placing foot directly on step due to limited cervical mobility. Bilateral support.   Wheelchair Mobility    Modified Rankin (Stroke Patients Only)       Balance Overall balance assessment: Needs assistance Sitting-balance support: Feet supported, Bilateral upper extremity supported Sitting balance-Leahy Scale: Fair     Standing balance support: Single extremity supported, During functional activity, Bilateral upper extremity supported Standing  balance-Leahy Scale: Good Standing balance comment: Unsupported  with UB ADL's at sink, does lean against sink for support.                            Cognition Arousal/Alertness: Awake/alert Behavior During Therapy: WFL for tasks assessed/performed Overall Cognitive Status: Within Functional Limits for tasks assessed                                          Exercises      General Comments        Pertinent Vitals/Pain Pain Assessment Pain Assessment: Faces Faces Pain Scale: Hurts whole lot Pain Location: posterior neck, HA Pain Descriptors / Indicators: Headache, Grimacing, Guarding, Constant, Radiating, Throbbing Pain Intervention(s): Limited activity within patient's tolerance, Monitored during session, Repositioned    Home Living                          Prior Function            PT Goals (current goals can now be found in the care plan section) Acute Rehab PT Goals Patient Stated Goal: decreased pain PT Goal Formulation: With patient Time For Goal Achievement: 09/10/22 Potential to Achieve Goals: Good Progress towards PT goals: Not progressing toward goals - comment (limited by pain)    Frequency    Min 5X/week      PT Plan Discharge plan needs to be updated;Equipment recommendations need to be updated;Frequency needs to be updated    Co-evaluation              AM-PAC PT "6 Clicks" Mobility   Outcome Measure  Help needed turning from your back to your side while in a flat bed without using bedrails?: A Little Help needed moving from lying on your back to sitting on the side of a flat bed without using bedrails?: A Little Help needed moving to and from a bed to a chair (including a wheelchair)?: A Little Help needed standing up from a chair using your arms (e.g., wheelchair or bedside chair)?: A Little Help needed to walk in hospital room?: A Little Help needed climbing 3-5 steps with a railing? : A Little 6  Click Score: 18    End of Session Equipment Utilized During Treatment: Gait belt;Cervical collar Activity Tolerance: Patient limited by pain Patient left: in bed;with call bell/phone within reach;with bed alarm set Nurse Communication: Mobility status PT Visit Diagnosis: Pain;Difficulty in walking, not elsewhere classified (R26.2)     Time: 1135-1200 PT Time Calculation (min) (ACUTE ONLY): 25 min  Charges:  $Gait Training: 8-22 mins $Therapeutic Activity: 8-22 mins                     Hendricks Milo, SPT  Acute Rehabilitation Services    Hendricks Milo 09/01/2022, 1:37 PM

## 2022-09-01 NOTE — Progress Notes (Signed)
Pt voided unmeasurable amount. Pt had large bm after enema. Pt AOx4.

## 2022-09-08 ENCOUNTER — Telehealth: Payer: Self-pay | Admitting: *Deleted

## 2022-09-08 NOTE — Progress Notes (Signed)
  Care Coordination   Note   09/08/2022 Name: FRITZ CAUTHON MRN: [id number] DOB: [birth year]  DAKING WESTERVELT is a 77 y.o. year old male who sees Irena Reichmann, [place] for primary care. I reached out to Harless Litten by phone today to offer care coordination services.  Mr. Kearse was given information about Care Coordination services today including:   The Care Coordination services include support from the care team which includes your Nurse Coordinator, Clinical Social Worker, or Pharmacist.  The Care Coordination team is here to help remove barriers to the health concerns and goals most important to you. Care Coordination services are voluntary, and the patient may decline or stop services at any time by request to their care team member.   Care Coordination Consent Status: Patient agreed to services and verbal consent obtained.   Follow up plan:  Telephone appointment with care coordination team member scheduled for:  09/11/22  Encounter Outcome:  Pt. Scheduled  [facility] Coordination Care Guide  Direct Dial: [phone]

## 2022-09-11 ENCOUNTER — Ambulatory Visit: Payer: Self-pay | Admitting: *Deleted

## 2022-09-11 NOTE — Patient Outreach (Addendum)
  Care Coordination   Initial Visit Note   09/11/2022 Name: [name] MRN: [id number] DOB: [birth year]  [name] is a 77 y.o. year old male who sees Irena Reichmann, [place] for primary care. I spoke with  [name] by phone today.  What matters to the patients health and wellness today?  Getting better after surgeries and limitations that caused.    Goals Addressed   None     SDOH assessments and interventions completed:  Yes     Care Coordination Interventions:  Yes, provided  Interventions Today    Flowsheet Row Most Recent Value  Chronic Disease   Chronic disease during today's visit Other  [reported "Sadness"/depression]  General Interventions   General Interventions Discussed/Reviewed General Interventions Discussed  Education Interventions   Education Provided Provided Education  Provided Verbal Education On Mental Health/Coping with Illness, Community Resources  [CSW validated and offered support- pt is recovering from surgery and getting better- looking forward to returning to the activities he enjoys- acknowledges the adjustment, frustration and challenges with his limitations]  Mental Health Interventions   Mental Health Discussed/Reviewed Mental Health Discussed, Coping Strategies, Other  [Pt denies concerns related to EMMI reported "sadness" or depression. Acknowledges major life changes since January due to surgeries and inability to do things he enjoys- golf, fish, yard work, Catering manager. He is optimistic- declines interventions at this time.]  Safety Interventions   Safety Discussed/Reviewed Safety Discussed  [CSW advised pt of "988" line and to ask PCP to place referral if needs arise]       Follow up plan: No further intervention required.   Encounter Outcome:  Pt. Visit Completed

## 2022-09-11 NOTE — Patient Instructions (Signed)
Visit Information  Thank you for taking time to visit with me today. Please don't hesitate to contact me if I can be of assistance to you.   Following are the goals we discussed today:   Goals Addressed   None     If you are experiencing a Mental Health or Behavioral Health Crisis or need someone to talk to, please call the Suicide and Crisis Lifeline: 988 call 911   The patient verbalized understanding of instructions, educational materials, and care plan provided today and DECLINED offer to receive copy of patient instructions, educational materials, and care plan.   No further follow up required:    Frona Yost, MSW, LCSW Clinical Social Worker [facility]-Care Coordination [phone]      

## 2022-09-12 DIAGNOSIS — Z86018 Personal history of other benign neoplasm: Secondary | ICD-10-CM | POA: Diagnosis not present

## 2022-09-12 DIAGNOSIS — Z981 Arthrodesis status: Secondary | ICD-10-CM | POA: Diagnosis not present

## 2022-09-12 DIAGNOSIS — Z4889 Encounter for other specified surgical aftercare: Secondary | ICD-10-CM | POA: Diagnosis not present

## 2022-09-12 DIAGNOSIS — Z7982 Long term (current) use of aspirin: Secondary | ICD-10-CM | POA: Diagnosis not present

## 2022-09-12 DIAGNOSIS — Z79891 Long term (current) use of opiate analgesic: Secondary | ICD-10-CM | POA: Diagnosis not present

## 2022-09-12 DIAGNOSIS — Z483 Aftercare following surgery for neoplasm: Secondary | ICD-10-CM | POA: Diagnosis not present

## 2022-09-12 DIAGNOSIS — Z9181 History of falling: Secondary | ICD-10-CM | POA: Diagnosis not present

## 2022-09-14 DIAGNOSIS — Z483 Aftercare following surgery for neoplasm: Secondary | ICD-10-CM | POA: Diagnosis not present

## 2022-09-14 DIAGNOSIS — Z86018 Personal history of other benign neoplasm: Secondary | ICD-10-CM | POA: Diagnosis not present

## 2022-09-14 DIAGNOSIS — Z981 Arthrodesis status: Secondary | ICD-10-CM | POA: Diagnosis not present

## 2022-09-14 DIAGNOSIS — Z7982 Long term (current) use of aspirin: Secondary | ICD-10-CM | POA: Diagnosis not present

## 2022-09-14 DIAGNOSIS — Z4889 Encounter for other specified surgical aftercare: Secondary | ICD-10-CM | POA: Diagnosis not present

## 2022-09-14 DIAGNOSIS — Z79891 Long term (current) use of opiate analgesic: Secondary | ICD-10-CM | POA: Diagnosis not present

## 2022-09-16 ENCOUNTER — Other Ambulatory Visit: Payer: Self-pay | Admitting: Radiation Therapy

## 2022-09-16 DIAGNOSIS — Z981 Arthrodesis status: Secondary | ICD-10-CM | POA: Diagnosis not present

## 2022-09-16 DIAGNOSIS — Z79891 Long term (current) use of opiate analgesic: Secondary | ICD-10-CM | POA: Diagnosis not present

## 2022-09-16 DIAGNOSIS — Z86018 Personal history of other benign neoplasm: Secondary | ICD-10-CM | POA: Diagnosis not present

## 2022-09-16 DIAGNOSIS — D447 Neoplasm of uncertain behavior of aortic body and other paraganglia: Secondary | ICD-10-CM

## 2022-09-16 DIAGNOSIS — Z4889 Encounter for other specified surgical aftercare: Secondary | ICD-10-CM | POA: Diagnosis not present

## 2022-09-16 DIAGNOSIS — Z483 Aftercare following surgery for neoplasm: Secondary | ICD-10-CM | POA: Diagnosis not present

## 2022-09-16 DIAGNOSIS — D334 Benign neoplasm of spinal cord: Secondary | ICD-10-CM | POA: Diagnosis not present

## 2022-09-16 DIAGNOSIS — Z7982 Long term (current) use of aspirin: Secondary | ICD-10-CM | POA: Diagnosis not present

## 2022-09-21 ENCOUNTER — Encounter: Payer: Self-pay | Admitting: Genetic Counselor

## 2022-09-22 ENCOUNTER — Inpatient Hospital Stay: Payer: Medicare Other | Attending: Internal Medicine | Admitting: Genetic Counselor

## 2022-09-22 ENCOUNTER — Encounter: Payer: Self-pay | Admitting: Genetic Counselor

## 2022-09-22 ENCOUNTER — Other Ambulatory Visit: Payer: Self-pay

## 2022-09-22 ENCOUNTER — Other Ambulatory Visit: Payer: Self-pay | Admitting: Genetic Counselor

## 2022-09-22 ENCOUNTER — Inpatient Hospital Stay: Payer: Medicare Other

## 2022-09-22 DIAGNOSIS — C649 Malignant neoplasm of unspecified kidney, except renal pelvis: Secondary | ICD-10-CM | POA: Diagnosis not present

## 2022-09-22 DIAGNOSIS — C799 Secondary malignant neoplasm of unspecified site: Secondary | ICD-10-CM | POA: Insufficient documentation

## 2022-09-22 DIAGNOSIS — Z4889 Encounter for other specified surgical aftercare: Secondary | ICD-10-CM | POA: Diagnosis not present

## 2022-09-22 DIAGNOSIS — Z87891 Personal history of nicotine dependence: Secondary | ICD-10-CM | POA: Insufficient documentation

## 2022-09-22 DIAGNOSIS — Z981 Arthrodesis status: Secondary | ICD-10-CM | POA: Diagnosis not present

## 2022-09-22 DIAGNOSIS — D492 Neoplasm of unspecified behavior of bone, soft tissue, and skin: Secondary | ICD-10-CM

## 2022-09-22 DIAGNOSIS — Z86010 Personal history of colonic polyps: Secondary | ICD-10-CM | POA: Diagnosis not present

## 2022-09-22 DIAGNOSIS — Z9079 Acquired absence of other genital organ(s): Secondary | ICD-10-CM | POA: Insufficient documentation

## 2022-09-22 DIAGNOSIS — Z8546 Personal history of malignant neoplasm of prostate: Secondary | ICD-10-CM | POA: Insufficient documentation

## 2022-09-22 DIAGNOSIS — Z85528 Personal history of other malignant neoplasm of kidney: Secondary | ICD-10-CM | POA: Diagnosis not present

## 2022-09-22 DIAGNOSIS — Z8052 Family history of malignant neoplasm of bladder: Secondary | ICD-10-CM

## 2022-09-22 DIAGNOSIS — Z905 Acquired absence of kidney: Secondary | ICD-10-CM | POA: Insufficient documentation

## 2022-09-22 DIAGNOSIS — D447 Neoplasm of uncertain behavior of aortic body and other paraganglia: Secondary | ICD-10-CM | POA: Diagnosis not present

## 2022-09-22 DIAGNOSIS — Z86018 Personal history of other benign neoplasm: Secondary | ICD-10-CM | POA: Diagnosis not present

## 2022-09-22 DIAGNOSIS — Z79899 Other long term (current) drug therapy: Secondary | ICD-10-CM | POA: Insufficient documentation

## 2022-09-22 DIAGNOSIS — Z7982 Long term (current) use of aspirin: Secondary | ICD-10-CM | POA: Insufficient documentation

## 2022-09-22 DIAGNOSIS — Z8042 Family history of malignant neoplasm of prostate: Secondary | ICD-10-CM | POA: Diagnosis not present

## 2022-09-22 DIAGNOSIS — Z79891 Long term (current) use of opiate analgesic: Secondary | ICD-10-CM | POA: Diagnosis not present

## 2022-09-22 DIAGNOSIS — C641 Malignant neoplasm of right kidney, except renal pelvis: Secondary | ICD-10-CM | POA: Insufficient documentation

## 2022-09-22 DIAGNOSIS — Z483 Aftercare following surgery for neoplasm: Secondary | ICD-10-CM | POA: Diagnosis not present

## 2022-09-22 HISTORY — DX: Neoplasm of uncertain behavior of aortic body and other paraganglia: D44.7

## 2022-09-22 LAB — GENETIC SCREENING ORDER

## 2022-09-22 NOTE — Progress Notes (Signed)
REFERRING PROVIDER: [name], MD [address] [place],  [place] [address]  PRIMARY PROVIDER:  Irena Reichmann, DO  PRIMARY REASON FOR VISIT:  1. Family history of prostate cancer   2. Family history of bladder cancer   3. 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 Paraganglioma (HCC)      HISTORY OF PRESENT ILLNESS:   [name], a 77 y.o. male, was seen for a [place] cancer genetics consultation at the request of Dr. Conchita Paris due to a personal and family history of cancer.  Mr. Highfill presents to clinic today to discuss the possibility of a hereditary predisposition to cancer, genetic testing, and to further clarify his future cancer risks, as well as potential cancer risks for family members.   In 2006, at the age of 85, [name] was diagnosed with prostate cancer.  In 2020, at the age of 81, he was diagnosed with clear cell kidney cancer, and in 2024, he was diagnosed with a paraganglioma.    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 [facility])   01/26/2019 - 08/23/2019 Chemotherapy   Patient is on Treatment Plan : HEAD/NECK Pembrolizumab Q21D       Past Medical History:  Diagnosis Date   Anxiety    Arthritis    Diverticulitis    Emphysema lung (HCC)    Family history of bladder cancer    Family history of prostate cancer    Hypertension    Inguinal hernia    Metastatic renal cell carcinoma (HCC) dx'd 2020   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   Polycythemia    phlebotomy 06/15/13   Prostate cancer (HCC) dx'd 2008   seed implant   Wears glasses    Wears hearing aid    both ears    Past Surgical History:  Procedure Laterality Date   COLONOSCOPY     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/2013   Procedure: EXCISION LEFT DUPUYTRENS RING AND SMALL FINGERS;  Surgeon: Wyn Forster., MD;  Location: [facility];  Service: Orthopedics;  Laterality: Left;   ESOPHAGOGASTRODUODENOSCOPY (EGD) WITH PROPOFOL N/A 05/12/2018   Procedure: ESOPHAGOGASTRODUODENOSCOPY (EGD) WITH PROPOFOL;  Surgeon: Carman Ching, MD;  Location: [facility];  Service: Endoscopy;  Laterality: N/A;   HERNIA REPAIR Left 2010   lt ing with lt hydrocele- inguinal   IR IMAGING GUIDED PORT INSERTION  01/20/2019   IR REMOVAL TUN ACCESS W/ PORT W/O FL MOD SED  11/21/2019   ORCHIECTOMY Right 10/29/2018   Procedure: ORCHIECTOMY radical;  Surgeon: Sebastian Ache, MD;  Location: [facility];  Service: Urology;  Laterality: Right;  75 MINS   POSTERIOR CERVICAL FUSION/FORAMINOTOMY Right 08/26/2022   Procedure: CERVICAL SEVEN-THORACIC ONE LAMINOTOMY AND FACETECTOMY FOR RESECTION OF INTRADURAL TUMOR WITH FUSION;  Surgeon: [name], MD;  Location: [facility];  Service: Neurosurgery;  Laterality: Right;  3C   RADIOACTIVE SEED IMPLANT  2006   hx of prostate   ROBOT ASSISTED LAPAROSCOPIC NEPHRECTOMY Right 06/22/2018   Procedure: XI ROBOTIC ASSISTED LAPAROSCOPIC NEPHRECTOMY;  Surgeon: Sebastian Ache, MD;  Location: [facility];  Service: Urology;  Laterality: Right;  3 HRS   SHOULDER ARTHROSCOPY  01/2012   right    Social History   Socioeconomic History   Marital status: Married  Spouse name: Not on file   Number of children: 1   Years of education: Not on file   Highest education level: Not on file  Occupational History   Not on file  Tobacco Use   Smoking status: Former    Types: Cigarettes    Quit date: 06/15/1980    Years since quitting: 42.2   Smokeless tobacco: Never  Vaping Use   Vaping Use: Never used  Substance and Sexual Activity   Alcohol use: Never   Drug use: No   Sexual activity: Not on file  Other Topics Concern   Not on file  Social History Narrative   Not on file   Social  Determinants of Health   Financial Resource Strain: Not on file  Food Insecurity: No Food Insecurity (08/26/2022)   Hunger Vital Sign    Worried About Running Out of Food in the Last Year: Never true    Ran Out of Food in the Last Year: Never true  Transportation Needs: No Transportation Needs (08/26/2022)   PRAPARE - Administrator, Civil Service (Medical): No    Lack of Transportation (Non-Medical): No  Physical Activity: Not on file  Stress: Not on file  Social Connections: Not on file     FAMILY HISTORY:  We obtained a detailed, 4-generation family history.  Significant diagnoses are listed below: Family History  Problem Relation Age of Onset   Heart attack Father    Hypertension Father    Pneumonia Sister    Stroke Brother    Leukemia Maternal Aunt    Stroke Maternal Aunt    Prostate cancer Maternal Uncle 72   Bladder Cancer Paternal Uncle 50   Heart attack Maternal Grandfather    Cancer Paternal Grandfather        NOS   Healthy Son       [name] is unaware of previous family history of genetic testing for hereditary cancer risks. Patient's maternal ancestors are of Caucasian descent, and paternal ancestors are of [place] descent. There is no reported Ashkenazi Jewish ancestry. There is no known consanguinity.  GENETIC COUNSELING ASSESSMENT: [name] is a 77 y.o. male with a personal and family history of cancer which is somewhat suggestive of a hereditary cancer syndrome and predisposition to cancer given his diagnosis of a paraganglioma. We, therefore, discussed and recommended the following at today's visit.   DISCUSSION: We discussed that, in general, most cancer is not inherited in families, but instead is sporadic or familial. Sporadic cancers occur by chance and typically happen at older ages (>50 years) as this type of cancer is caused by genetic changes acquired during an individual's lifetime. Some families have more cancers than would be expected  by chance; however, the ages or types of cancer are not consistent with a known genetic mutation or known genetic mutations have been ruled out. This type of familial cancer is thought to be due to a combination of multiple genetic, environmental, hormonal, and lifestyle factors. While this combination of factors likely increases the risk of cancer, the exact source of this risk is not currently identifiable or testable.  We discussed that 5 - 10% of cancer is hereditary.  Each type of cancer, or tumor, may have it's own risk for being hereditary.  About 40% of cases of paraganglioma's are hereditary, with most cases associated with SDHx.  There are other genes that can be associated with hereditary paraganglioma syndromes.  These include MAX, [id number] and a few others.  These are typically not associated with an increased risk for solid tumor cancers. We discussed that testing is beneficial for several reasons including knowing how to follow individuals after completing their treatment, identifying whether potential treatment options such as PARP inhibitors would be beneficial, and understand if other family members could be at risk for cancer and allow them to undergo genetic testing.   We reviewed the characteristics, features and inheritance patterns of hereditary cancer syndromes. We also discussed genetic testing, including the appropriate family members to test, the process of testing, insurance coverage and turn-around-time for results. We discussed the implications of a negative, positive, carrier and/or variant of uncertain significant result. Mr. Bellard  was offered a common hereditary cancer panel (47 genes) and an expanded pan-cancer panel (77 genes). Mr. Mika was informed of the benefits and limitations of each panel, including that expanded pan-cancer panels contain genes that do not have clear management guidelines at this point in time.  We also discussed that as the number of genes included on  a panel increases, the chances of variants of uncertain significance increases. Mr. Mulford decided to pursue genetic testing for the CancerNext-Expanded+RNA gene panel.   The CancerNext-Expanded gene panel offered by [facility] and includes sequencing and rearrangement analysis for the following 77 genes: AIP, ALK, APC*, ATM*, AXIN2, BAP1, BARD1, BMPR1A, BRCA1*, BRCA2*, BRIP1*, CDC73, CDH1*, CDK4, CDKN1B, CDKN2A, CHEK2*, CTNNA1, DICER1, FH, FLCN, KIF1B, LZTR1, MAX, MEN1, MET, MLH1*, MSH2*, MSH3, MSH6*, MUTYH*, NF1*, NF2, NTHL1, PALB2*, PHOX2B, PMS2*, POT1, PRKAR1A, PTCH1, PTEN*, RAD51C*, RAD51D*, RB1, RET, SDHA, SDHAF2, SDHB, SDHC, SDHD, SMAD4, SMARCA4, SMARCB1, SMARCE1, STK11, SUFU, TMEM127, TP53*, TSC1, TSC2, and VHL (sequencing and deletion/duplication); EGFR, EGLN1, HOXB13, KIT, MITF, PDGFRA, POLD1, and POLE (sequencing only); EPCAM and GREM1 (deletion/duplication only). DNA and RNA analyses performed for * genes.  Based on Mr. Conwell personal and family history of cancer, he meets medical criteria for genetic testing. Despite that he meets criteria, he may still have an out of pocket cost. We discussed that if his out of pocket cost for testing is over $100, the laboratory will call and confirm whether he wants to proceed with testing.  If the out of pocket cost of testing is less than $100 he will be billed by the genetic testing laboratory.   We discussed that some people do not want to undergo genetic testing due to fear of genetic discrimination.  The Genetic Information Nondiscrimination Act (GINA) was signed into federal law in 2008. GINA prohibits health insurers and most employers from discriminating against individuals based on genetic information (including the results of genetic tests and family history information). According to GINA, health insurance companies cannot consider genetic information to be a preexisting condition, nor can they use it to make decisions regarding coverage or  rates. GINA also makes it illegal for most employers to use genetic information in making decisions about hiring, firing, promotion, or terms of employment. It is important to note that GINA does not offer protections for life insurance, disability insurance, or long-term care insurance. GINA does not apply to those in the [employer], those who work for companies with less than 15 employees, and new life insurance or long-term disability insurance policies.  Health status due to a cancer diagnosis is not protected under GINA. More information about GINA can be found by visiting [email or web address].   PLAN: After considering the risks, benefits, and limitations, Mr. Palau provided informed consent to pursue genetic testing and the blood sample was sent to [facility] for analysis of the CancerNext-Expanded+RNAinsight. Results should be available within approximately 2-3 weeks' time, at which point they will be disclosed by telephone to Mr. Fellers, as will any additional recommendations warranted by these results. Mr. Quam will receive a summary of his genetic counseling visit and a copy of his results once available. This information will also be available in Epic.   Lastly, we encouraged Mr. Goren to remain in contact with cancer genetics annually so that we can continuously update the family history and inform him of any changes in cancer genetics and testing that may be of benefit for this family.   Mr. Luginbill questions were answered to his satisfaction today. Our contact information was provided should additional questions or concerns arise. Thank you for the referral and allowing [place] to share in the care of your patient.   Cruise Baumgardner P. Lowell Guitar, MS, [facility] Licensed, Patent attorney [email or web address] phone: [phone]  The patient was seen for a total of 45 minutes in face-to-face genetic counseling.  The patient brought his wife. Drs. Meliton Rattan, and/or [place]  were available for questions, if needed..    _______________________________________________________________________ For Office Staff:  Number of people involved in session: 2 Was an Intern/ student involved with case: no

## 2022-09-24 DIAGNOSIS — Z86018 Personal history of other benign neoplasm: Secondary | ICD-10-CM | POA: Diagnosis not present

## 2022-09-24 DIAGNOSIS — Z981 Arthrodesis status: Secondary | ICD-10-CM | POA: Diagnosis not present

## 2022-09-24 DIAGNOSIS — Z4889 Encounter for other specified surgical aftercare: Secondary | ICD-10-CM | POA: Diagnosis not present

## 2022-09-24 DIAGNOSIS — Z483 Aftercare following surgery for neoplasm: Secondary | ICD-10-CM | POA: Diagnosis not present

## 2022-09-24 DIAGNOSIS — Z7982 Long term (current) use of aspirin: Secondary | ICD-10-CM | POA: Diagnosis not present

## 2022-09-24 DIAGNOSIS — Z79891 Long term (current) use of opiate analgesic: Secondary | ICD-10-CM | POA: Diagnosis not present

## 2022-09-29 ENCOUNTER — Other Ambulatory Visit: Payer: Self-pay | Admitting: *Deleted

## 2022-09-29 ENCOUNTER — Inpatient Hospital Stay: Payer: Medicare Other | Admitting: Internal Medicine

## 2022-09-29 ENCOUNTER — Other Ambulatory Visit: Payer: Self-pay

## 2022-09-29 ENCOUNTER — Inpatient Hospital Stay
Admission: RE | Admit: 2022-09-29 | Discharge: 2022-09-29 | Disposition: A | Discharge status: Home / Self Care | Payer: Self-pay | Source: Ambulatory Visit | Attending: Internal Medicine | Admitting: Internal Medicine

## 2022-09-29 ENCOUNTER — Telehealth: Payer: Self-pay | Admitting: *Deleted

## 2022-09-29 ENCOUNTER — Inpatient Hospital Stay: Payer: Medicare Other

## 2022-09-29 VITALS — BP 147/92 | HR 81 | Temp 98.1°F | Resp 18 | Wt 179.1 lb

## 2022-09-29 DIAGNOSIS — Z9079 Acquired absence of other genital organ(s): Secondary | ICD-10-CM | POA: Diagnosis not present

## 2022-09-29 DIAGNOSIS — D447 Neoplasm of uncertain behavior of aortic body and other paraganglia: Secondary | ICD-10-CM

## 2022-09-29 DIAGNOSIS — C641 Malignant neoplasm of right kidney, except renal pelvis: Secondary | ICD-10-CM

## 2022-09-29 DIAGNOSIS — C799 Secondary malignant neoplasm of unspecified site: Secondary | ICD-10-CM | POA: Diagnosis not present

## 2022-09-29 DIAGNOSIS — C649 Malignant neoplasm of unspecified kidney, except renal pelvis: Secondary | ICD-10-CM

## 2022-09-29 DIAGNOSIS — Z8546 Personal history of malignant neoplasm of prostate: Secondary | ICD-10-CM | POA: Diagnosis not present

## 2022-09-29 DIAGNOSIS — Z7982 Long term (current) use of aspirin: Secondary | ICD-10-CM | POA: Diagnosis not present

## 2022-09-29 DIAGNOSIS — E039 Hypothyroidism, unspecified: Secondary | ICD-10-CM

## 2022-09-29 DIAGNOSIS — Z87891 Personal history of nicotine dependence: Secondary | ICD-10-CM | POA: Diagnosis not present

## 2022-09-29 DIAGNOSIS — Z905 Acquired absence of kidney: Secondary | ICD-10-CM | POA: Diagnosis not present

## 2022-09-29 DIAGNOSIS — Z79899 Other long term (current) drug therapy: Secondary | ICD-10-CM | POA: Diagnosis not present

## 2022-09-29 LAB — CBC WITH DIFFERENTIAL (CANCER CENTER ONLY)
Abs Immature Granulocytes: 0.03 10*3/uL (ref 0.00–0.07)
Basophils Absolute: 0.1 10*3/uL (ref 0.0–0.1)
Basophils Relative: 1 %
Eosinophils Absolute: 0.2 10*3/uL (ref 0.0–0.5)
Eosinophils Relative: 4 %
HCT: 44.5 % (ref 39.0–52.0)
Hemoglobin: 14.8 g/dL (ref 13.0–17.0)
Immature Granulocytes: 1 %
Lymphocytes Relative: 20 %
Lymphs Abs: 1.1 10*3/uL (ref 0.7–4.0)
MCH: 28.4 pg (ref 26.0–34.0)
MCHC: 33.3 g/dL (ref 30.0–36.0)
MCV: 85.4 fL (ref 80.0–100.0)
Monocytes Absolute: 0.6 10*3/uL (ref 0.1–1.0)
Monocytes Relative: 11 %
Neutro Abs: 3.4 10*3/uL (ref 1.7–7.7)
Neutrophils Relative %: 63 %
Platelet Count: 179 10*3/uL (ref 150–400)
RBC: 5.21 MIL/uL (ref 4.22–5.81)
RDW: 13.6 % (ref 11.5–15.5)
WBC Count: 5.4 10*3/uL (ref 4.0–10.5)
nRBC: 0 % (ref 0.0–0.2)

## 2022-09-29 LAB — CMP (CANCER CENTER ONLY)
ALT: 7 U/L (ref 0–44)
AST: 9 U/L — ABNORMAL LOW (ref 15–41)
Albumin: 3.9 g/dL (ref 3.5–5.0)
Alkaline Phosphatase: 53 U/L (ref 38–126)
Anion gap: 5 (ref 5–15)
BUN: 19 mg/dL (ref 8–23)
CO2: 30 mmol/L (ref 22–32)
Calcium: 9.8 mg/dL (ref 8.9–10.3)
Chloride: 106 mmol/L (ref 98–111)
Creatinine: 1.27 mg/dL — ABNORMAL HIGH (ref 0.61–1.24)
GFR, Estimated: 59 mL/min — ABNORMAL LOW (ref >60)
Glucose, Bld: 105 mg/dL — ABNORMAL HIGH (ref 70–99)
Potassium: 4.1 mmol/L (ref 3.5–5.1)
Sodium: 141 mmol/L (ref 135–145)
Total Bilirubin: 0.4 mg/dL (ref 0.3–1.2)
Total Protein: 6.6 g/dL (ref 6.5–8.1)

## 2022-09-29 LAB — TSH: TSH: 1.649 u[IU]/mL (ref 0.350–4.500)

## 2022-09-29 NOTE — Telephone Encounter (Signed)
Requested that [facility] push images from cervical spine MRI done on 08/18/22 to [facility].

## 2022-09-29 NOTE — Progress Notes (Signed)
[facility] at [facility] [address]  [place], [place] [address] [phone]   New Patient Evaluation  Date of Service: 09/29/22 Patient Name: [name] Patient MRN: [id number] Patient DOB: [birth year] Provider: Henreitta Leber, MD  Identifying Statement:  [name] is a 77 y.o. male with  C7   paraganglioma  who presents for initial consultation and evaluation.    Referring Provider: [name], MD 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  [name] presents for follow up after resection of cervical paraganglioma with Dr. Conchita Paris.  He describes several months of severe pain in his right shoulder and arm, as well as weakness in his right hand.  Following 2 unsuccessful laminectomies, he underwent contrast enhanced MRI study which demonstrated dumbell shaped mass along nerve root at C7 on the right.  He underwent resection of this on 08/26/22; following surgery he had dramatic improvement in both pain and weakness.  Presently he has post-surgical discomfort only.  No neurologic complaints.      Medications: Current Outpatient Medications on File Prior to Visit  Medication Sig Dispense Refill   aspirin EC 81 MG tablet Take 81 mg by mouth daily. Swallow whole.     busPIRone (BUSPAR) 5 MG tablet Take 5 mg by  mouth 2 (two) times daily as needed (for anxiety).     Carboxymethylcellulose Sodium (THERATEARS OP) Place 1 drop into both eyes as needed (dry eyes).     fluticasone (FLONASE) 50 MCG/ACT nasal spray Place 1 spray into both nostrils daily as needed for allergies.      losartan-hydrochlorothiazide (HYZAAR) 100-25 MG tablet Take 1 tablet by mouth daily.     pravastatin (PRAVACHOL) 40 MG tablet Take 40 mg by mouth daily.     diazepam (VALIUM) 5 MG tablet Take 1 tablet (5 mg total) by mouth every 8 (eight) hours as needed for anxiety. 28 tablet 0   INLYTA 5 MG tablet TAKE 1 TABLET BY MOUTH TWICE DAILY. TAKE 12 HOURS APART WITH WATER, WITH OR WITHOUT FOOD 60 tablet 0   lidocaine-prilocaine (EMLA) cream Apply 1 application topically as needed. 30 g 0   No current facility-administered medications on file prior to visit.    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   Polycythemia    phlebotomy 06/15/13   Prostate cancer (HCC) dx'd 2008   seed implant   Wears glasses    Wears hearing aid    both ears   Past Surgical History:  Past Surgical History:  Procedure Laterality Date   COLONOSCOPY     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   Procedure: EXCISION LEFT DUPUYTRENS RING AND SMALL FINGERS;  Surgeon: Wyn Forster., MD;  Location: [facility];  Service: Orthopedics;  Laterality: Left;    ESOPHAGOGASTRODUODENOSCOPY (EGD) WITH PROPOFOL N/A 05/12/2018   Procedure: ESOPHAGOGASTRODUODENOSCOPY (EGD) WITH PROPOFOL;  Surgeon: Carman Ching, MD;  Location: [facility];  Service: Endoscopy;  Laterality: N/A;   HERNIA REPAIR Left 2010   lt ing with lt hydrocele- inguinal   IR IMAGING GUIDED PORT INSERTION  01/20/2019   IR REMOVAL TUN ACCESS W/ PORT W/O FL MOD SED  11/21/2019   ORCHIECTOMY Right 10/29/2018   Procedure: ORCHIECTOMY radical;  Surgeon: Sebastian Ache, MD;  Location: [facility];  Service: Urology;  Laterality: Right;  75 MINS   POSTERIOR CERVICAL FUSION/FORAMINOTOMY Right 08/26/2022   Procedure: CERVICAL SEVEN-THORACIC ONE LAMINOTOMY AND FACETECTOMY FOR RESECTION OF INTRADURAL TUMOR WITH FUSION;  Surgeon: [name], MD;  Location: [facility];  Service: Neurosurgery;  Laterality: Right;  3C   RADIOACTIVE SEED IMPLANT  2006   hx of prostate   ROBOT ASSISTED LAPAROSCOPIC NEPHRECTOMY Right 06/22/2018   Procedure: XI ROBOTIC ASSISTED LAPAROSCOPIC NEPHRECTOMY;  Surgeon: Sebastian Ache, MD;  Location: [facility];  Service: Urology;  Laterality: Right;  3 HRS   SHOULDER ARTHROSCOPY  01/2012   right   Social History:  Social History   Socioeconomic History   Marital status: Married    Spouse name: Not on file   Number of children: 1   Years of education: Not on file   Highest education level: Not on file  Occupational History   Not on file  Tobacco Use   Smoking status: Former    Current packs/day: 0.00    Types: Cigarettes    Quit date: 06/15/1980    Years since quitting: 42.3   Smokeless tobacco: Never  Vaping Use   Vaping status: Never Used  Substance and Sexual Activity   Alcohol use: Never   Drug use: No   Sexual activity: Not on file  Other Topics Concern   Not on file  Social History Narrative   Not on file   Social Determinants of Health   Financial Resource Strain: Not on file  Food Insecurity: No Food Insecurity (08/26/2022)   Hunger Vital Sign     Worried About Running Out of Food in the Last Year: Never true    Ran Out of Food in the Last Year: Never true  Transportation Needs: No Transportation Needs (08/26/2022)   PRAPARE - Administrator, Civil Service (Medical): No    Lack of Transportation (Non-Medical): No  Physical Activity: Not on file  Stress: Not on file  Social Connections: Not on file  Intimate Partner Violence: Not At Risk (08/26/2022)   Humiliation, Afraid, Rape, and Kick questionnaire    Fear of Current or Ex-Partner: No    Emotionally Abused: No    Physically Abused: No    Sexually Abused: No   Family History:  Family History  Problem Relation Age of Onset   Heart attack Father    Hypertension Father    Pneumonia Sister    Stroke Brother  Leukemia Maternal Aunt    Stroke Maternal Aunt    Prostate cancer Maternal Uncle 72   Bladder Cancer Paternal Uncle 47   Heart attack Maternal Grandfather    Cancer Paternal Grandfather        NOS   Healthy Son     Review of Systems: Constitutional: Doesn't report fevers, chills or abnormal weight loss Eyes: Doesn't report blurriness of vision Ears, nose, mouth, throat, and face: Doesn't report sore throat Respiratory: Doesn't report cough, dyspnea or wheezes Cardiovascular: Doesn't report palpitation, chest discomfort  Gastrointestinal:  Doesn't report nausea, constipation, diarrhea GU: Doesn't report incontinence Skin: Doesn't report skin rashes Neurological: Per HPI Musculoskeletal: Doesn't report joint pain Behavioral/Psych: Doesn't report anxiety  Physical Exam: There were no vitals filed for this visit. KPS: 90. General: Alert, cooperative, pleasant, in no acute distress Head: Normal EENT: No conjunctival injection or scleral icterus.  Lungs: Resp effort normal Cardiac: Regular rate Abdomen: Non-distended abdomen Skin: No rashes cyanosis or petechiae. Extremities: No clubbing or edema  Neurologic Exam: Mental Status: Awake, alert,  attentive to examiner. Oriented to self and environment. Language is fluent with intact comprehension.  Cranial Nerves: Visual acuity is grossly normal. Visual fields are full. Extra-ocular movements intact. No ptosis. Face is symmetric Motor: Tone and bulk are normal. Power is full in both arms and legs. Reflexes are symmetric, no pathologic reflexes present.  Sensory: Intact to light touch Gait: Normal.   Labs: I have reviewed the data as listed    Component Value Date/Time   NA 140 08/26/2022 1725   NA 141 01/18/2015 0835   K 3.7 08/26/2022 1725   K 4.1 01/18/2015 0835   CL 101 08/25/2022 0900   CO2 28 08/25/2022 0900   CO2 25 01/18/2015 0835   GLUCOSE 109 (H) 08/25/2022 0900   GLUCOSE 111 01/18/2015 0835   BUN 18 08/25/2022 0900   BUN 21.5 01/18/2015 0835   CREATININE 1.39 (H) 08/25/2022 0900   CREATININE 1.33 (H) 05/11/2022 1010   CREATININE 1.1 01/18/2015 0835   CALCIUM 9.7 08/25/2022 0900   CALCIUM 9.8 01/18/2015 0835   PROT 7.4 05/11/2022 1010   PROT 7.1 01/18/2015 0835   ALBUMIN 4.4 05/11/2022 1010   ALBUMIN 3.8 01/18/2015 0835   AST 18 05/11/2022 1010   AST 15 01/18/2015 0835   ALT 15 05/11/2022 1010   ALT 16 01/18/2015 0835   ALKPHOS 51 05/11/2022 1010   ALKPHOS 66 01/18/2015 0835   BILITOT 0.8 05/11/2022 1010   BILITOT 0.91 01/18/2015 0835   GFRNONAA 53 (L) 08/25/2022 0900   GFRNONAA 56 (L) 05/11/2022 1010   GFRAA 50 (L) 11/21/2019 0832   GFRAA 56 (L) 10/16/2019 0819   Lab Results  Component Value Date   WBC 6.0 08/25/2022   NEUTROABS 3.0 05/11/2022   HGB 13.9 08/26/2022   HCT 41.0 08/26/2022   MCV 86.4 08/25/2022   PLT 237 08/25/2022    Imaging:  DG Shoulder Right  Result Date: 09/01/2022 CLINICAL DATA:  Recent fall with right shoulder pain, initial encounter EXAM: RIGHT SHOULDER - 2+ VIEW COMPARISON:  None Available. FINDINGS: Degenerative changes of the acromioclavicular joint are seen. No acute fracture or dislocation is noted. No soft tissue  abnormality is seen. IMPRESSION: Degenerative change without acute abnormality. Electronically Signed   By: Alcide Clever M.D.   On: 09/01/2022 19:37   DG CHEST PORT 1 VIEW  Result Date: 08/30/2022 CLINICAL DATA:  Fever and lethargy. EXAM: PORTABLE CHEST 1 VIEW COMPARISON:  Chest CT  dated 05/11/2022. FINDINGS: Left lung base atelectasis. Pneumonia is not excluded. Clinical correlation is recommended. No focal consolidation, pleural effusion, thorax. Stable cardiac silhouette. No acute osseous pathology. Lower cervical fusion. IMPRESSION: Left lung base atelectasis versus infiltrate. Electronically Signed   By: Elgie Collard M.D.   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 INTRADURAL TUMOR, RESECTION: - Paraganglioma, see comment   COMMENT:  Dr. Reynolds Bowl reviewed the case and concurs with the diagnosis.   GROSS DESCRIPTION:  Received in formalin are 1 x 1 x 0.6 cm of soft tan hemorrhagic tissue. The specimen is entirely submitted in 1 cassette.  [facility] 08/27/2022)   Final Diagnosis performed by Holley Bouche, MD.   Electronically signed 08/28/2022    Assessment/Plan Paraganglioma (HCC) - Plan: Metanephrines, plasma  We appreciate the opportunity to participate in the care of [name].  He is clinically improved following surgery.  We reviewed pathology and prognosis with him and family.  A majority of these tumors are benign, though recurrence does occur even sometimes in totally resected lesions.  There is no clinical sign of catecholamine secretion at this time.  He has post-op MRI cervical spine scheduled for later this week.  We will review that in CNS tumor board next week.  Would also recommend checking metanephrines in serum, per guidelines.  Will do that today.  Screening for potential  clinical trials was performed and discussed using eligibility criteria for active protocols at [facility], loco-regional tertiary centers, as well as national database available on GroundTransfer.at.    The patient is not a candidate for a research protocol at this time due to no suitable study identified.   We spent twenty additional minutes teaching regarding the natural history, biology, and historical experience in the treatment of brain tumors. We then discussed in detail the current recommendations for therapy focusing on the mode of administration, mechanism of action, anticipated toxicities, and quality of life issues associated with this plan. We also provided teaching sheets for the patient to take home as an additional resource.  We will give him a call next week after tumor board.  All questions were answered. The patient knows to call the clinic with any problems, questions or concerns. No barriers to learning were detected.  The total time spent in the encounter was 60 minutes and more than 50% was on counseling and review of test results   Henreitta Leber, MD Medical Director of Neuro-Oncology [facility] at [facility] 09/29/22 9:52 AM

## 2022-10-01 ENCOUNTER — Telehealth: Payer: Self-pay | Admitting: Genetic Counselor

## 2022-10-01 ENCOUNTER — Telehealth: Payer: Self-pay | Admitting: *Deleted

## 2022-10-01 ENCOUNTER — Inpatient Hospital Stay
Admission: RE | Admit: 2022-10-01 | Discharge: 2022-10-01 | Disposition: A | Discharge status: Home / Self Care | Payer: Self-pay | Source: Ambulatory Visit | Attending: Internal Medicine | Admitting: Internal Medicine

## 2022-10-01 ENCOUNTER — Encounter: Payer: Self-pay | Admitting: Genetic Counselor

## 2022-10-01 ENCOUNTER — Ambulatory Visit: Payer: Self-pay | Admitting: Genetic Counselor

## 2022-10-01 ENCOUNTER — Other Ambulatory Visit: Payer: Self-pay | Admitting: *Deleted

## 2022-10-01 DIAGNOSIS — D492 Neoplasm of unspecified behavior of bone, soft tissue, and skin: Secondary | ICD-10-CM

## 2022-10-01 DIAGNOSIS — D447 Neoplasm of uncertain behavior of aortic body and other paraganglia: Secondary | ICD-10-CM

## 2022-10-01 DIAGNOSIS — Z1379 Encounter for other screening for genetic and chromosomal anomalies: Secondary | ICD-10-CM | POA: Insufficient documentation

## 2022-10-01 DIAGNOSIS — M2548 Effusion, other site: Secondary | ICD-10-CM | POA: Diagnosis not present

## 2022-10-01 DIAGNOSIS — Z981 Arthrodesis status: Secondary | ICD-10-CM | POA: Diagnosis not present

## 2022-10-01 DIAGNOSIS — R9089 Other abnormal findings on diagnostic imaging of central nervous system: Secondary | ICD-10-CM | POA: Diagnosis not present

## 2022-10-01 DIAGNOSIS — D334 Benign neoplasm of spinal cord: Secondary | ICD-10-CM | POA: Diagnosis not present

## 2022-10-01 NOTE — Telephone Encounter (Signed)
Requested that [facility] push images from today's MRI to [facility].

## 2022-10-01 NOTE — Progress Notes (Signed)
HPI:  Mr. Magoon was previously seen in the [facility] clinic due to a personal and family history of cancer and concerns regarding a hereditary predisposition to cancer. Please refer to our prior cancer genetics clinic note for more information regarding our discussion, assessment and recommendations, at the time. Mr. Caraveo recent genetic test results were disclosed to him, as were recommendations warranted by these results. These results and recommendations are discussed in more detail below.  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  The report date is 09/30/2022.  The CancerNext-Expanded gene panel offered by [facility] and includes sequencing and rearrangement analysis for the following 77 genes: AIP, ALK, APC*, ATM*, AXIN2, BAP1, BARD1, BMPR1A, BRCA1*, BRCA2*, BRIP1*, CDC73, CDH1*, CDK4, CDKN1B, CDKN2A, CHEK2*, CTNNA1, DICER1, FH, FLCN, KIF1B, LZTR1, MAX, MEN1, MET, MLH1*, MSH2*, MSH3, MSH6*, MUTYH*, NF1*, NF2, NTHL1, PALB2*, PHOX2B, PMS2*, POT1, PRKAR1A, PTCH1, PTEN*, RAD51C*, RAD51D*, RB1, RET, SDHA, SDHAF2, SDHB, SDHC, SDHD, SMAD4, SMARCA4, SMARCB1, SMARCE1, STK11, SUFU, TMEM127, TP53*, TSC1, TSC2, and VHL (sequencing and deletion/duplication); EGFR, EGLN1, HOXB13, KIT, MITF, PDGFRA, POLD1, and POLE (sequencing only); EPCAM and GREM1 (deletion/duplication only). DNA and RNA analyses performed for * genes.      FAMILY HISTORY:  We obtained a detailed, 4-generation family history.  Significant diagnoses are listed below: Family History  Problem Relation Age of Onset   Heart  attack Father    Hypertension Father    Pneumonia Sister    Stroke Brother    Leukemia Maternal Aunt    Stroke Maternal Aunt    Prostate cancer Maternal Uncle 72   Bladder Cancer Paternal Uncle 61   Heart attack Maternal Grandfather    Cancer Paternal Grandfather        NOS   Healthy Son          Mr. Khamis is unaware of previous family history of genetic testing for hereditary cancer risks. Patient's maternal ancestors are of Caucasian descent, and paternal ancestors are of [place] descent. There is no reported Ashkenazi Jewish ancestry. There is no known consanguinity  GENETIC TEST RESULTS: Genetic testing reported out on September 30, 2022 through the CancerNext-Expanded+RNAinsight cancer panel found no pathogenic mutations. The CancerNext-Expanded gene panel offered by [facility] and includes sequencing and rearrangement analysis for the following 77 genes: AIP, ALK, APC*, ATM*, AXIN2, BAP1, BARD1, BMPR1A, BRCA1*, BRCA2*, BRIP1*, CDC73, CDH1*, CDK4, CDKN1B, CDKN2A, CHEK2*, CTNNA1, DICER1, FH, FLCN, KIF1B, LZTR1, MAX, MEN1, MET, MLH1*, MSH2*, MSH3, MSH6*, MUTYH*, NF1*, NF2, NTHL1, PALB2*, PHOX2B, PMS2*, POT1, PRKAR1A, PTCH1, PTEN*, RAD51C*, RAD51D*, RB1, RET, SDHA, SDHAF2, SDHB, SDHC, SDHD, SMAD4, SMARCA4, SMARCB1, SMARCE1, STK11, SUFU, TMEM127, TP53*, TSC1, TSC2, and VHL (sequencing and deletion/duplication); EGFR, EGLN1, HOXB13, KIT, MITF, PDGFRA, POLD1, and POLE (sequencing only); EPCAM and GREM1 (deletion/duplication only). DNA and RNA analyses performed for * genes. The test report has been scanned into EPIC and is located under the Molecular Pathology section of the Results Review tab.  A portion of the result report is included below for reference.     We discussed with Mr. Noda that because current genetic testing is not perfect, it is possible there  may be a gene mutation in one of these genes that current testing cannot detect, but that chance is small.  We also discussed, that  there could be another gene that has not yet been discovered, or that we have not yet tested, that is responsible for the cancer diagnoses in the family. It is also possible there is a hereditary cause for the cancer in the family that Mr. Zobrist did not inherit and therefore was not identified in his testing.  Therefore, it is important to remain in touch with cancer genetics in the future so that we can continue to offer Mr. Capp the most up to date genetic testing.   ADDITIONAL GENETIC TESTING: We discussed with Mr. Krasinski that his genetic testing was fairly extensive.  If there are genes identified to increase cancer risk that can be analyzed in the future, we would be happy to discuss and coordinate this testing at that time.    CANCER SCREENING RECOMMENDATIONS: Mr. Schurman test result is considered negative (normal).  This means that we have not identified a hereditary cause for his personal and family history of cancer at this time. Most cancers happen by chance and this negative test suggests that his cancer may fall into this category.    Possible reasons for Mr. Effertz negative genetic test include:  1. There may be a gene mutation in one of these genes that current testing methods cannot detect but that chance is small.  2. There could be another gene that has not yet been discovered, or that we have not yet tested, that is responsible for the cancer diagnoses in the family.  3.  There may be no hereditary risk for cancer in the family. The cancers in Mr. Noxon and/or his family may be sporadic/familial or due to other genetic and environmental factors. 4. It is also possible there is a hereditary cause for the cancer in the family that Mr. Poblete did not inherit.  Therefore, it is recommended he continue to follow the cancer management and screening guidelines provided by his oncology and primary healthcare provider. An individual's cancer risk and medical management are not  determined by genetic test results alone. Overall cancer risk assessment incorporates additional factors, including personal medical history, family history, and any available genetic information that may result in a personalized plan for cancer prevention and surveillance   RECOMMENDATIONS FOR FAMILY MEMBERS:  Individuals in this family might be at some increased risk of developing cancer, over the general population risk, simply due to the family history of cancer.  We recommended women in this family have a yearly mammogram beginning at age 31, or 8 years younger than the earliest onset of cancer, an annual clinical breast exam, and perform monthly breast self-exams. Women in this family should also have a gynecological exam as recommended by their primary provider. All family members should be referred for colonoscopy starting at age 75.  FOLLOW-UP: Lastly, we discussed with Mr. Pfluger that cancer genetics is a rapidly advancing field and it is possible that new genetic tests will be appropriate for him and/or his family members in the future. We encouraged him to remain in contact with cancer genetics on an annual basis so we can update his personal and family histories and let him know of advances in cancer genetics that may benefit this family.   Our contact number was provided. Mr. Placencia questions were answered to his satisfaction, and he knows he is welcome to call us at anytime  with additional questions or concerns.   Maylon Cos, MS, [facility] Licensed, Certified Genetic Counselor [email or web address]

## 2022-10-01 NOTE — Telephone Encounter (Signed)
Revealed negative genetic testing.  Discussed that we do not know why he has prostate, kidney cancer or why there is cancer in the family. It could be due to a different gene that we are not testing, or maybe our current technology may not be able to pick something up.  It will be important for him to keep in contact with genetics to keep up with whether additional testing may be needed.

## 2022-10-02 ENCOUNTER — Encounter: Payer: Self-pay | Admitting: Neurosurgery

## 2022-10-06 ENCOUNTER — Inpatient Hospital Stay: Payer: Medicare Other | Admitting: Internal Medicine

## 2022-10-06 DIAGNOSIS — D447 Neoplasm of uncertain behavior of aortic body and other paraganglia: Secondary | ICD-10-CM

## 2022-10-06 NOTE — Progress Notes (Signed)
I connected with [name] on 10/06/22 at  3:00 PM EDT by telephone visit and verified that I am speaking with the correct person using two identifiers.  I discussed the limitations, risks, security and privacy concerns of performing an evaluation and management service by telemedicine and the availability of in-person appointments. I also discussed with the patient that there may be a patient responsible charge related to this service. The patient expressed understanding and agreed to proceed.   Other persons participating in the visit and their role in the encounter:  n/a  Patient's location:  Home Provider's location:  Office  Chief Complaint:  Paraganglioma [facility])  History of Present Ilness: [name] reports no clinical changes today.  He continues to have severe pain in his neck.  This is frustratingly unimproved in recent weeks, despite surgery more than a month prior.  He feels pressure in his neck and some symptoms running down his right arm.    Observations: Language and cognition at baseline  Imaging:  [facility] Clinician Interpretation: I have personally reviewed the CNS images as listed.  My interpretation, in the context of the patient's clinical presentation, is  gross total resection, large fluid collection in neck possibly consistent with CSF leak or extravasation.  No results found.  Assessment and Plan: Paraganglioma (HCC)  Clinically refractory post-surgical pain is likely secondary to well visualized fluid collection in posterior neck.  We will reach out to [facility] to assess these imaging and clinical concerns.  Dr. Conchita Paris may be out on PAL at this time.  Otherwise we will plan to repeat MRI cervical spine in 6 months, no further staging or systemic imaging.  Follow Up Instructions: RTC after next MRI study.  I discussed the assessment and treatment plan with the patient.  The patient was provided an opportunity to ask questions and all were  answered.  The patient agreed with the plan and demonstrated understanding of the instructions.    The patient was advised to call back or seek an in-person evaluation if the symptoms worsen or if the condition fails to improve as anticipated.    Henreitta Leber, MD   I provided 22 minutes of non face-to-face telephone visit time during this encounter, and > 50% was spent counseling as documented under my assessment & plan.

## 2022-10-19 DIAGNOSIS — I10 Essential (primary) hypertension: Secondary | ICD-10-CM | POA: Diagnosis not present

## 2022-10-19 DIAGNOSIS — R7309 Other abnormal glucose: Secondary | ICD-10-CM | POA: Diagnosis not present

## 2022-10-19 DIAGNOSIS — Z8546 Personal history of malignant neoplasm of prostate: Secondary | ICD-10-CM | POA: Diagnosis not present

## 2022-10-19 DIAGNOSIS — R7989 Other specified abnormal findings of blood chemistry: Secondary | ICD-10-CM | POA: Diagnosis not present

## 2022-10-26 DIAGNOSIS — K219 Gastro-esophageal reflux disease without esophagitis: Secondary | ICD-10-CM | POA: Diagnosis not present

## 2022-10-26 DIAGNOSIS — M5136 Other intervertebral disc degeneration, lumbar region: Secondary | ICD-10-CM | POA: Diagnosis not present

## 2022-10-26 DIAGNOSIS — I10 Essential (primary) hypertension: Secondary | ICD-10-CM | POA: Diagnosis not present

## 2022-10-26 DIAGNOSIS — I700 Atherosclerosis of aorta: Secondary | ICD-10-CM | POA: Diagnosis not present

## 2022-10-26 DIAGNOSIS — D45 Polycythemia vera: Secondary | ICD-10-CM | POA: Diagnosis not present

## 2022-10-26 DIAGNOSIS — E7800 Pure hypercholesterolemia, unspecified: Secondary | ICD-10-CM | POA: Diagnosis not present

## 2022-10-26 DIAGNOSIS — Z0000 Encounter for general adult medical examination without abnormal findings: Secondary | ICD-10-CM | POA: Diagnosis not present

## 2022-10-26 DIAGNOSIS — J439 Emphysema, unspecified: Secondary | ICD-10-CM | POA: Diagnosis not present

## 2022-10-26 DIAGNOSIS — R7309 Other abnormal glucose: Secondary | ICD-10-CM | POA: Diagnosis not present

## 2022-10-26 DIAGNOSIS — N1832 Chronic kidney disease, stage 3b: Secondary | ICD-10-CM | POA: Diagnosis not present

## 2022-10-26 DIAGNOSIS — Z8546 Personal history of malignant neoplasm of prostate: Secondary | ICD-10-CM | POA: Diagnosis not present

## 2022-10-30 ENCOUNTER — Telehealth: Payer: Self-pay

## 2022-10-30 ENCOUNTER — Telehealth: Payer: Self-pay | Admitting: Internal Medicine

## 2022-10-30 ENCOUNTER — Other Ambulatory Visit: Payer: Self-pay | Admitting: Physician Assistant

## 2022-10-30 DIAGNOSIS — D492 Neoplasm of unspecified behavior of bone, soft tissue, and skin: Secondary | ICD-10-CM

## 2022-10-30 DIAGNOSIS — C649 Malignant neoplasm of unspecified kidney, except renal pelvis: Secondary | ICD-10-CM

## 2022-10-30 NOTE — Telephone Encounter (Signed)
Returned pt's voicemail regarding lab appt prior to CT Scan scheduled on 11/30/2022 and f/u appt w/Dr. Arbutus Ped.  Scheduled pt for lab appt on 11/30/2022 prior to CT Scan and stated this nurse will send a scheduling message to the Scheduling Team to schedule pt for f/u appt to go over CT Scan results.

## 2022-11-19 ENCOUNTER — Other Ambulatory Visit (HOSPITAL_COMMUNITY): Payer: Self-pay | Admitting: Neurosurgery

## 2022-11-19 DIAGNOSIS — G96198 Other disorders of meninges, not elsewhere classified: Secondary | ICD-10-CM

## 2022-11-24 DIAGNOSIS — Z23 Encounter for immunization: Secondary | ICD-10-CM | POA: Diagnosis not present

## 2022-11-30 ENCOUNTER — Inpatient Hospital Stay: Payer: Medicare Other | Attending: Internal Medicine

## 2022-11-30 ENCOUNTER — Ambulatory Visit (HOSPITAL_COMMUNITY)
Admission: RE | Admit: 2022-11-30 | Discharge: 2022-11-30 | Disposition: A | Discharge status: Home / Self Care | Payer: Medicare Other | Source: Ambulatory Visit | Attending: Internal Medicine | Admitting: Internal Medicine

## 2022-11-30 DIAGNOSIS — Z905 Acquired absence of kidney: Secondary | ICD-10-CM | POA: Insufficient documentation

## 2022-11-30 DIAGNOSIS — C641 Malignant neoplasm of right kidney, except renal pelvis: Secondary | ICD-10-CM | POA: Insufficient documentation

## 2022-11-30 DIAGNOSIS — Z9079 Acquired absence of other genital organ(s): Secondary | ICD-10-CM | POA: Diagnosis not present

## 2022-11-30 DIAGNOSIS — J439 Emphysema, unspecified: Secondary | ICD-10-CM | POA: Diagnosis not present

## 2022-11-30 DIAGNOSIS — N281 Cyst of kidney, acquired: Secondary | ICD-10-CM | POA: Diagnosis not present

## 2022-11-30 DIAGNOSIS — D751 Secondary polycythemia: Secondary | ICD-10-CM | POA: Diagnosis not present

## 2022-11-30 DIAGNOSIS — D447 Neoplasm of uncertain behavior of aortic body and other paraganglia: Secondary | ICD-10-CM | POA: Insufficient documentation

## 2022-11-30 LAB — CBC WITH DIFFERENTIAL (CANCER CENTER ONLY)
Abs Immature Granulocytes: 0.04 10*3/uL (ref 0.00–0.07)
Basophils Absolute: 0.1 10*3/uL (ref 0.0–0.1)
Basophils Relative: 1 %
Eosinophils Absolute: 0.3 10*3/uL (ref 0.0–0.5)
Eosinophils Relative: 5 %
HCT: 51.8 % (ref 39.0–52.0)
Hemoglobin: 17.3 g/dL — ABNORMAL HIGH (ref 13.0–17.0)
Immature Granulocytes: 1 %
Lymphocytes Relative: 23 %
Lymphs Abs: 1.5 10*3/uL (ref 0.7–4.0)
MCH: 28.1 pg (ref 26.0–34.0)
MCHC: 33.4 g/dL (ref 30.0–36.0)
MCV: 84.2 fL (ref 80.0–100.0)
Monocytes Absolute: 0.6 10*3/uL (ref 0.1–1.0)
Monocytes Relative: 9 %
Neutro Abs: 4.1 10*3/uL (ref 1.7–7.7)
Neutrophils Relative %: 61 %
Platelet Count: 226 10*3/uL (ref 150–400)
RBC: 6.15 MIL/uL — ABNORMAL HIGH (ref 4.22–5.81)
RDW: 13.7 % (ref 11.5–15.5)
WBC Count: 6.6 10*3/uL (ref 4.0–10.5)
nRBC: 0 % (ref 0.0–0.2)

## 2022-11-30 LAB — CMP (CANCER CENTER ONLY)
ALT: 10 U/L (ref 0–44)
AST: 12 U/L — ABNORMAL LOW (ref 15–41)
Albumin: 4.3 g/dL (ref 3.5–5.0)
Alkaline Phosphatase: 49 U/L (ref 38–126)
Anion gap: 7 (ref 5–15)
BUN: 19 mg/dL (ref 8–23)
CO2: 28 mmol/L (ref 22–32)
Calcium: 9.7 mg/dL (ref 8.9–10.3)
Chloride: 106 mmol/L (ref 98–111)
Creatinine: 1.29 mg/dL — ABNORMAL HIGH (ref 0.61–1.24)
GFR, Estimated: 57 mL/min — ABNORMAL LOW (ref >60)
Glucose, Bld: 109 mg/dL — ABNORMAL HIGH (ref 70–99)
Potassium: 3.9 mmol/L (ref 3.5–5.1)
Sodium: 141 mmol/L (ref 135–145)
Total Bilirubin: 0.8 mg/dL (ref 0.3–1.2)
Total Protein: 7.2 g/dL (ref 6.5–8.1)

## 2022-11-30 LAB — LACTATE DEHYDROGENASE: LDH: 104 U/L (ref 98–192)

## 2022-12-07 ENCOUNTER — Ambulatory Visit: Payer: Medicare Other | Admitting: Internal Medicine

## 2022-12-07 DIAGNOSIS — G9782 Other postprocedural complications and disorders of nervous system: Secondary | ICD-10-CM | POA: Diagnosis not present

## 2022-12-07 DIAGNOSIS — G9600 Cerebrospinal fluid leak, unspecified: Secondary | ICD-10-CM | POA: Diagnosis not present

## 2022-12-14 NOTE — Progress Notes (Unsigned)
[facility] OFFICE PROGRESS NOTE  Irena Reichmann, DO 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 2) reactive polycythemia diagnosed in 2010.  PRIOR THERAPY: 1) status post radical nephrectomy in April 2020 and the final pathology showed clear-cell histology invading into the perineural fat indicating stage IIIa with negative tumor margin. 2) status post orchiectomy completed October 29, 2018 and the final pathology showed metastatic carcinoma consistent with clear-cell histology. 3) status post treatment with Keytruda 200 Mg IV every 3 weeks in addition to axitinib 5 mg p.o. daily started January 26, 2019.  Treatment discontinued in May 2021 based on the patient's preference and complete response.  Status post 11 cycles.  CURRENT THERAPY: Observation and intermittent phlebotomy for the polycythemia.   INTERVAL HISTORY: [name] 77 y.o. male returns to the clinic today for a follow-up visit.  The patient was previously followed by Dr. Clelia Croft and establish care with Dr. Arbutus Ped in March 2024.  The patient is followed for polycythemia and renal cell carcinoma.  The patient is seen every 6 months.   In the interval since last being seen, the patient had resection of a cervical paraganglioma with Dr. Conchita Paris. He describes several months of severe pain in his right shoulder and arm, as well as weakness in his right hand. Following 2 unsuccessful laminectomies, he underwent contrast enhanced MRI study which demonstrated mass along nerve root at C7 on the right. He underwent resection of this on 08/26/22; following surgery he had improvement in both pain and weakness.  He has establish care with Dr. Barbaraann Cao.  He was also seen by genetic counseling. His next MR cervical spine is  scheduled for January.   However, he started having pain again in his neck. He has a fluid collection in his neck. He is scheduled for surgery next week with [facility].   Otherwise he denies any recent fever, chills, night sweats, or unexplained weight loss (except he lost 30 lbs after his neck surgery but started to gain it back).  Denies any adenopathy.  Denies any chest pain, cough, or hemoptysis.  He reports some mild shortness of breath due to some pressure from the leakage of the cerebrospinal fluid.  He reports he had some low back pain for several years secondary to his prior surgery and arthritis.  Denies any changes.  Denies any blood in the urine, malodorous urine, or dysuria.  Denies any nausea, vomiting, diarrhea, or constipation.  He is here today for evaluation and to review his recent CT scan the scan.    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  MEDICATIONS:  Current Outpatient Medications  Medication Sig Dispense Refill   aspirin EC 81 MG tablet Take 81 mg  by mouth daily. Swallow whole.     busPIRone (BUSPAR) 5 MG tablet Take 5 mg by mouth 2 (two) times daily as needed (for anxiety).     Carboxymethylcellulose Sodium (THERATEARS OP) Place 1 drop into both eyes as needed (dry eyes).     diazepam (VALIUM) 5 MG tablet Take 1 tablet (5 mg total) by mouth every 8 (eight) hours as needed for anxiety. 28 tablet 0   fluticasone (FLONASE) 50 MCG/ACT nasal spray Place 1 spray into both nostrils  daily as needed for allergies.      INLYTA 5 MG tablet TAKE 1 TABLET BY MOUTH TWICE DAILY. TAKE 12 HOURS APART WITH WATER, WITH OR WITHOUT FOOD 60 tablet 0   lidocaine-prilocaine (EMLA) cream Apply 1 application topically as needed. 30 g 0   losartan-hydrochlorothiazide (HYZAAR) 100-25 MG tablet Take 1 tablet by mouth daily.     pravastatin (PRAVACHOL) 40 MG tablet Take 40 mg by mouth daily.     No current facility-administered medications for this visit.    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   Procedure: EXCISION LEFT DUPUYTRENS RING AND SMALL FINGERS;  Surgeon: Wyn Forster., MD;  Location: [facility];  Service: Orthopedics;  Laterality: Left;   ESOPHAGOGASTRODUODENOSCOPY (EGD) WITH PROPOFOL N/A 05/12/2018   Procedure: ESOPHAGOGASTRODUODENOSCOPY (EGD) WITH PROPOFOL;  Surgeon: Carman Ching, MD;  Location: [facility];  Service: Endoscopy;  Laterality: N/A;   HERNIA REPAIR Left 2010   lt ing with lt hydrocele- inguinal   IR IMAGING GUIDED PORT INSERTION  01/20/2019   IR REMOVAL TUN ACCESS W/ PORT W/O FL MOD SED  11/21/2019   ORCHIECTOMY Right 10/29/2018   Procedure: ORCHIECTOMY radical;  Surgeon: Sebastian Ache, MD;  Location: [facility];  Service: Urology;  Laterality: Right;  75 MINS   POSTERIOR CERVICAL FUSION/FORAMINOTOMY Right 08/26/2022   Procedure: CERVICAL SEVEN-THORACIC ONE LAMINOTOMY AND FACETECTOMY FOR RESECTION OF INTRADURAL TUMOR WITH FUSION;  Surgeon: [name], MD;  Location: [facility];  Service: Neurosurgery;  Laterality: Right;  3C   RADIOACTIVE SEED IMPLANT  2006   hx of prostate   ROBOT ASSISTED LAPAROSCOPIC NEPHRECTOMY Right 06/22/2018   Procedure: XI ROBOTIC ASSISTED LAPAROSCOPIC NEPHRECTOMY;  Surgeon: Sebastian Ache, MD;  Location: [facility];  Service: Urology;  Laterality: Right;  3 HRS   SHOULDER ARTHROSCOPY  01/2012   right    REVIEW OF  SYSTEMS:   Review of Systems  Constitutional: Negative for appetite change, chills, fatigue, fever and unexpected weight change.  HENT: Negative for mouth sores, nosebleeds, sore throat and trouble swallowing.   Eyes: Negative for eye problems and icterus.  Respiratory: Positive for mild shortness of breath. Negative for cough, hemoptysis and wheezing.   Cardiovascular: Negative for chest pain and leg swelling.  Gastrointestinal: Negative for abdominal pain, constipation, diarrhea, nausea and vomiting.  Genitourinary: Negative for bladder incontinence, difficulty urinating, dysuria, frequency and hematuria.   Musculoskeletal: Positive for neck pain. Negative for back pain, gait problem,  and neck stiffness.  Skin: Negative for itching and rash.  Neurological: Negative for dizziness, extremity weakness, gait problem, headaches, light-headedness and seizures.  Hematological: Negative for adenopathy. Does not bruise/bleed easily.  Psychiatric/Behavioral: Negative for confusion, depression and sleep disturbance. The patient is not nervous/anxious.     PHYSICAL EXAMINATION:  There were no vitals taken for this visit.  ECOG PERFORMANCE STATUS: 1  Physical Exam  Constitutional: Oriented to person,  place, and time and well-developed, well-nourished, and in no distress.  HENT:  Head: Normocephalic and atraumatic.  Mouth/Throat: Oropharynx is clear and moist. No oropharyngeal exudate.  Eyes: Conjunctivae are normal. Right eye exhibits no discharge. Left eye exhibits no discharge. No scleral icterus.  Neck: Normal range of motion. Neck supple.  Cardiovascular: Normal rate, regular rhythm, normal heart sounds and intact distal pulses.   Pulmonary/Chest: Effort normal and breath sounds normal. No respiratory distress. No wheezes. No rales.  Abdominal: Soft. Bowel sounds are normal. Exhibits no distension and no mass. There is no tenderness.  Musculoskeletal: Positive for decreased ROM of the neck.  Normal range of motion. Exhibits no edema.  Lymphadenopathy:    No cervical adenopathy.  Neurological: Alert and oriented to person, place, and time. Exhibits normal muscle tone. Gait normal. Coordination normal.  Skin: Skin is warm and dry. No rash noted. Not diaphoretic. No erythema. No pallor.  Psychiatric: Mood, memory and judgment normal.  Vitals reviewed.  LABORATORY DATA: Lab Results  Component Value Date   WBC 6.6 11/30/2022   HGB 17.3 (H) 11/30/2022   HCT 51.8 11/30/2022   MCV 84.2 11/30/2022   PLT 226 11/30/2022      Chemistry      Component Value Date/Time   NA 141 11/30/2022 0731   NA 141 01/18/2015 0835   K 3.9 11/30/2022 0731   K 4.1 01/18/2015 0835   CL 106 11/30/2022 0731   CO2 28 11/30/2022 0731   CO2 25 01/18/2015 0835   BUN 19 11/30/2022 0731   BUN 21.5 01/18/2015 0835   CREATININE 1.29 (H) 11/30/2022 0731   CREATININE 1.1 01/18/2015 0835      Component Value Date/Time   CALCIUM 9.7 11/30/2022 0731   CALCIUM 9.8 01/18/2015 0835   ALKPHOS 49 11/30/2022 0731   ALKPHOS 66 01/18/2015 0835   AST 12 (L) 11/30/2022 0731   AST 15 01/18/2015 0835   ALT 10 11/30/2022 0731   ALT 16 01/18/2015 0835   BILITOT 0.8 11/30/2022 0731   BILITOT 0.91 01/18/2015 0835       RADIOGRAPHIC STUDIES:  CT CHEST ABDOMEN PELVIS WO CONTRAST  Result Date: 12/14/2022 CLINICAL DATA:  Stage IV clear cell renal cell carcinoma with spermatic cord involvement EXAM: CT CHEST, ABDOMEN AND PELVIS WITHOUT CONTRAST TECHNIQUE: Multidetector CT imaging of the chest, abdomen and pelvis was performed following the standard protocol without IV contrast. RADIATION DOSE REDUCTION: This exam was performed according to the departmental dose-optimization program which includes automated exposure control, adjustment of the mA and/or kV according to patient size and/or use of iterative reconstruction technique. COMPARISON:  CT chest abdomen pelvis dated 05/11/2022. Partial comparison to MRI cervical  spine dated 10/01/2022. FINDINGS: CT CHEST FINDINGS Cardiovascular: The numbness wall heart is normal in size. Trace inferior pericardial fluid. No evidence of thoracic aortic aneurysm. Atherosclerotic calcifications of the aortic arch. Moderate coronary atherosclerosis of the LAD and left circumflex. Mediastinum/Nodes: No suspicious mediastinal lymphadenopathy. Visualized thyroid is unremarkable. Lungs/Pleura: Mild centrilobular and paraseptal emphysematous changes, upper lung predominant. Stable 3 mm nodule in the superior segment left lower lobe (series 6/image 98), benign. Stable calcified granuloma in the right upper lobe (series 6, image 85). No new/suspicious pulmonary nodules. No focal consolidation. Mild scarring in the bilateral lower lobes. No pleural effusion or pneumothorax. Musculoskeletal: Postsurgical changes involving the lower cervical spine. Associated 8.3 x 6.9 cm posterior fluid collection (series 2/image 1), incompletely visualized. This previously measured 6.5 x 6.4 cm on MR, suggesting mild progression.  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 Degenerative changes of the thoracic spine. CT ABDOMEN PELVIS FINDINGS Hepatobiliary: Unenhanced liver is unremarkable. Gallbladder is unremarkable. No intrahepatic or extrahepatic duct dilatation. Pancreas: Within normal limits. Spleen: Within normal limits. Adrenals/Urinary Tract: Adrenal glands are within normal limits. Status post right nephrectomy. No abnormal soft tissue in the surgical bed. 4.6 cm lateral interpolar left renal cyst (series 2/image 39), measuring some fluid density, benign (Bosniak I). No follow-up is recommended. No renal calculi or hydronephrosis. Bladder is underdistended. Stomach/Bowel: Stomach is within normal limits. No evidence of bowel obstruction. Normal appendix (series 2/image 82). Left colonic diverticulosis, without evidence of diverticulitis. Vascular/Lymphatic: No evidence of abdominal aortic aneurysm.  Atherosclerotic calcifications of the abdominal aorta and branch vessels. No suspicious abdominopelvic lymphadenopathy. Reproductive: Brachytherapy seeds in the prostate. Other: No abdominopelvic ascites. Tiny fat containing right inguinal hernia. Mild fat in the left inguinal canal. Musculoskeletal: Degenerative changes of the thoracic spine. No focal osseous lesions. IMPRESSION: Status post right nephrectomy. No evidence of recurrent or metastatic disease. Brachytherapy seeds in the prostate. Postsurgical changes involving the lower cervical spine. Associated 8.3 cm posterior fluid collection, incompletely visualized, progressive from prior MRI and favoring CSF leak/pseudomeningocele. Aortic Atherosclerosis (ICD10-I70.0) and Emphysema (ICD10-J43.9). Electronically Signed   By: Charline Bills M.D.   On: 12/14/2022 02:06     ASSESSMENT/PLAN:  This is a very pleasant 77 year old Caucasian male with a history of stage IV renal cell carcinoma that was diagnosed as a stage III in April 2020.  He had evidence of spermatic cord involvement in August 2020.  He is status post right radical nephrectomy in April 2020 and the pathology showed clear-cell renal cell carcinoma invading the perineural fat indicating stage T3a with negative margins.  He is also status ports orchiectomy in August 2020 and the final pathology showed metastatic carcinoma consistent with clear-cell histology.  He underwent 11 cycles of Keytruda and axitinib between January 26, 2019 through May 2021 discontinued based on The patient's request and had complete response at that time. He is currently on observation and feeling fine with no concerning complaints.  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  Patient had some surgical complications with fluid collection and he is expected to have surgery at [facility] next week.  He is followed with Dr. Barbaraann Cao and saw genetic  counseling.  He is expected to have a repeat MRI of the cervical spine and January 2025  The patient recently had a restaging CT scan performed.  The patient was seen with Dr. Arbutus Ped today.  Dr. Arbutus Ped personally independently reviewed the scan and discussed the results with the patient today.  The scan showed no evidence of disease progression.  Scan mention the associate a 0.3 cm posterior fluid collection which is incompletely visualized favoring CSF leakage/but pseudomeningocele she is having surgery next week at [facility] for this.  We will see him back for follow-up visit in 6 months for evaluation repeat CT scan of the chest, abdomen, pelvis.    The patient's hematocrit is 51.8 and his hemoglobin is 17.3  we  will arrange for phlebotomy today due to his polycythemia.  The patient was advised to call immediately if she has any concerning symptoms in the interval. The patient voices understanding of current disease status and treatment options and is in agreement with the current care plan. All questions were answered. The patient knows to call the clinic with any problems,  questions or concerns. We can certainly see the patient much sooner if necessary   No orders of the defined types were placed in this encounter.   Oryon Gary L Melvern Ramone, PA-C 12/14/22  ADDENDUM: Hematology/Oncology Attending: I had a face-to-face encounter with the patient today.  I reviewed his record, lab, scan and recommended his care plan.  This is a very pleasant 77 years old white male with history of stage IV clear-cell renal cell carcinoma of the right kidney initially diagnosed as localized disease in 2020 followed by spermatic cord involvement in August 2020.  The patient also has a history of reactive polycythemia since 2010.  He was treated in the past with right radical nephrectomy followed by orchiectomy followed by treatment with [facility] and axitinib for 11 cycles discontinued in May 2021 based on the  patient his request and he had complete response at that time.  The patient has been in observation since that time.  He presented to the clinic today for evaluation with repeat CT scan of the chest, abdomen and pelvis as well as repeat blood work. I personally and independently reviewed his imaging studies and there is no evidence for disease recurrence or metastasis.  I recommended for him to continue on observation with repeat CT scan of the chest, abdomen and pelvis in 6 months. For the reactive polycythemia, the patient has elevated hemoglobin and hematocrit and we will consider him for phlebotomy today.  He also has resection of a cervical paraganglioma and recent MRI of the cervical spine showed mass along the nerve root at C7 on the right.  He is expected to have another surgery on his neck soon at [facility]. I recommended for the patient to proceed with phlebotomy today to reduce his hemoglobin and hematocrit. He will come back for follow-up visit in 6 months for evaluation with repeat lab work as well as imaging studies. The patient was advised to call immediately if he has any other concerning symptoms in the interval. The total time spent in the appointment was 30 minutes. Disclaimer: This note was dictated with voice recognition software. Similar sounding words can inadvertently be transcribed and may be missed upon review. Lajuana Matte, MD

## 2022-12-15 ENCOUNTER — Inpatient Hospital Stay: Payer: Medicare Other | Admitting: Physician Assistant

## 2022-12-16 ENCOUNTER — Inpatient Hospital Stay: Payer: Medicare Other

## 2022-12-16 ENCOUNTER — Inpatient Hospital Stay: Payer: Medicare Other | Attending: Internal Medicine | Admitting: Physician Assistant

## 2022-12-16 VITALS — BP 126/86 | HR 90 | Temp 98.6°F | Resp 18 | Wt 185.6 lb

## 2022-12-16 VITALS — BP 125/81 | HR 70 | Resp 18

## 2022-12-16 DIAGNOSIS — D751 Secondary polycythemia: Secondary | ICD-10-CM | POA: Diagnosis not present

## 2022-12-16 DIAGNOSIS — C641 Malignant neoplasm of right kidney, except renal pelvis: Secondary | ICD-10-CM | POA: Diagnosis not present

## 2022-12-16 DIAGNOSIS — C649 Malignant neoplasm of unspecified kidney, except renal pelvis: Secondary | ICD-10-CM | POA: Diagnosis not present

## 2022-12-16 NOTE — Progress Notes (Signed)
Per Dr Arbutus Ped & C. Heilingoeter PA, ok to proceed with phlebotomy today with labs from 11/30/22.    [name] presents today for phlebotomy per MD orders. 16 g phlebotomy set to L antecubital. Phlebotomy procedure started at 1531 and ended at 1537. 524 grams removed. Patient tolerated procedure well. IV needle removed intact. Patient declined to stay for 30 minute post observation time, VS WNL, left in stable condition.

## 2022-12-21 DIAGNOSIS — G9600 Cerebrospinal fluid leak, unspecified: Secondary | ICD-10-CM | POA: Diagnosis not present

## 2022-12-21 DIAGNOSIS — G96198 Other disorders of meninges, not elsewhere classified: Secondary | ICD-10-CM | POA: Diagnosis not present

## 2022-12-21 DIAGNOSIS — Z86018 Personal history of other benign neoplasm: Secondary | ICD-10-CM | POA: Diagnosis not present

## 2022-12-21 DIAGNOSIS — G9782 Other postprocedural complications and disorders of nervous system: Secondary | ICD-10-CM | POA: Diagnosis not present

## 2022-12-23 DIAGNOSIS — G96198 Other disorders of meninges, not elsewhere classified: Secondary | ICD-10-CM | POA: Diagnosis not present

## 2022-12-23 DIAGNOSIS — Z86018 Personal history of other benign neoplasm: Secondary | ICD-10-CM | POA: Diagnosis not present

## 2022-12-23 DIAGNOSIS — Z87891 Personal history of nicotine dependence: Secondary | ICD-10-CM | POA: Diagnosis not present

## 2022-12-23 DIAGNOSIS — G9782 Other postprocedural complications and disorders of nervous system: Secondary | ICD-10-CM | POA: Diagnosis not present

## 2022-12-23 DIAGNOSIS — M542 Cervicalgia: Secondary | ICD-10-CM | POA: Diagnosis not present

## 2022-12-23 DIAGNOSIS — Z981 Arthrodesis status: Secondary | ICD-10-CM | POA: Diagnosis not present

## 2022-12-23 DIAGNOSIS — G9600 Cerebrospinal fluid leak, unspecified: Secondary | ICD-10-CM | POA: Diagnosis not present

## 2022-12-29 ENCOUNTER — Ambulatory Visit: Payer: Medicare Other | Admitting: Internal Medicine

## 2022-12-30 ENCOUNTER — Encounter: Payer: Self-pay | Admitting: Physician Assistant

## 2022-12-30 NOTE — Telephone Encounter (Signed)
TC

## 2023-01-12 DIAGNOSIS — Z87891 Personal history of nicotine dependence: Secondary | ICD-10-CM | POA: Diagnosis not present

## 2023-01-12 DIAGNOSIS — Z79899 Other long term (current) drug therapy: Secondary | ICD-10-CM | POA: Diagnosis not present

## 2023-01-12 DIAGNOSIS — I10 Essential (primary) hypertension: Secondary | ICD-10-CM | POA: Diagnosis not present

## 2023-01-12 DIAGNOSIS — G9600 Cerebrospinal fluid leak, unspecified: Secondary | ICD-10-CM | POA: Diagnosis not present

## 2023-01-12 DIAGNOSIS — Z7982 Long term (current) use of aspirin: Secondary | ICD-10-CM | POA: Diagnosis not present

## 2023-01-12 DIAGNOSIS — M960 Pseudarthrosis after fusion or arthrodesis: Secondary | ICD-10-CM | POA: Diagnosis not present

## 2023-01-12 DIAGNOSIS — Z8546 Personal history of malignant neoplasm of prostate: Secondary | ICD-10-CM | POA: Diagnosis not present

## 2023-01-12 DIAGNOSIS — Z01818 Encounter for other preprocedural examination: Secondary | ICD-10-CM | POA: Diagnosis not present

## 2023-01-12 DIAGNOSIS — J439 Emphysema, unspecified: Secondary | ICD-10-CM | POA: Diagnosis present

## 2023-01-12 DIAGNOSIS — I959 Hypotension, unspecified: Secondary | ICD-10-CM | POA: Diagnosis not present

## 2023-01-12 DIAGNOSIS — D751 Secondary polycythemia: Secondary | ICD-10-CM | POA: Diagnosis present

## 2023-01-12 DIAGNOSIS — I129 Hypertensive chronic kidney disease with stage 1 through stage 4 chronic kidney disease, or unspecified chronic kidney disease: Secondary | ICD-10-CM | POA: Diagnosis present

## 2023-01-12 DIAGNOSIS — G9609 Other spinal cerebrospinal fluid leak: Secondary | ICD-10-CM | POA: Diagnosis present

## 2023-01-12 DIAGNOSIS — G8929 Other chronic pain: Secondary | ICD-10-CM | POA: Diagnosis present

## 2023-01-12 DIAGNOSIS — G8918 Other acute postprocedural pain: Secondary | ICD-10-CM | POA: Diagnosis not present

## 2023-01-12 DIAGNOSIS — M5031 Other cervical disc degeneration,  high cervical region: Secondary | ICD-10-CM | POA: Diagnosis not present

## 2023-01-12 DIAGNOSIS — G96198 Other disorders of meninges, not elsewhere classified: Secondary | ICD-10-CM | POA: Diagnosis present

## 2023-01-12 DIAGNOSIS — J449 Chronic obstructive pulmonary disease, unspecified: Secondary | ICD-10-CM | POA: Diagnosis not present

## 2023-01-12 DIAGNOSIS — G9782 Other postprocedural complications and disorders of nervous system: Secondary | ICD-10-CM | POA: Diagnosis present

## 2023-01-12 DIAGNOSIS — N182 Chronic kidney disease, stage 2 (mild): Secondary | ICD-10-CM | POA: Diagnosis present

## 2023-01-12 DIAGNOSIS — N183 Chronic kidney disease, stage 3 unspecified: Secondary | ICD-10-CM | POA: Diagnosis not present

## 2023-01-12 DIAGNOSIS — Z905 Acquired absence of kidney: Secondary | ICD-10-CM | POA: Diagnosis not present

## 2023-01-12 DIAGNOSIS — G9611 Dural tear: Secondary | ICD-10-CM | POA: Diagnosis not present

## 2023-01-12 DIAGNOSIS — N1831 Chronic kidney disease, stage 3a: Secondary | ICD-10-CM | POA: Diagnosis not present

## 2023-01-12 DIAGNOSIS — R1310 Dysphagia, unspecified: Secondary | ICD-10-CM | POA: Diagnosis present

## 2023-01-12 DIAGNOSIS — Z981 Arthrodesis status: Secondary | ICD-10-CM | POA: Diagnosis not present

## 2023-01-12 DIAGNOSIS — Z85528 Personal history of other malignant neoplasm of kidney: Secondary | ICD-10-CM | POA: Diagnosis not present

## 2023-01-12 DIAGNOSIS — K219 Gastro-esophageal reflux disease without esophagitis: Secondary | ICD-10-CM | POA: Diagnosis present

## 2023-01-12 DIAGNOSIS — F419 Anxiety disorder, unspecified: Secondary | ICD-10-CM | POA: Diagnosis present

## 2023-01-15 DIAGNOSIS — Z8546 Personal history of malignant neoplasm of prostate: Secondary | ICD-10-CM | POA: Diagnosis not present

## 2023-01-15 DIAGNOSIS — F419 Anxiety disorder, unspecified: Secondary | ICD-10-CM | POA: Diagnosis present

## 2023-01-15 DIAGNOSIS — Z981 Arthrodesis status: Secondary | ICD-10-CM | POA: Diagnosis not present

## 2023-01-15 DIAGNOSIS — Z85528 Personal history of other malignant neoplasm of kidney: Secondary | ICD-10-CM | POA: Diagnosis not present

## 2023-01-15 DIAGNOSIS — R1310 Dysphagia, unspecified: Secondary | ICD-10-CM | POA: Diagnosis present

## 2023-01-15 DIAGNOSIS — G8929 Other chronic pain: Secondary | ICD-10-CM | POA: Diagnosis present

## 2023-01-15 DIAGNOSIS — Z905 Acquired absence of kidney: Secondary | ICD-10-CM | POA: Diagnosis not present

## 2023-01-15 DIAGNOSIS — G9609 Other spinal cerebrospinal fluid leak: Secondary | ICD-10-CM | POA: Diagnosis present

## 2023-01-15 DIAGNOSIS — M960 Pseudarthrosis after fusion or arthrodesis: Secondary | ICD-10-CM | POA: Diagnosis not present

## 2023-01-15 DIAGNOSIS — G9782 Other postprocedural complications and disorders of nervous system: Secondary | ICD-10-CM | POA: Diagnosis present

## 2023-01-15 DIAGNOSIS — D751 Secondary polycythemia: Secondary | ICD-10-CM | POA: Diagnosis present

## 2023-01-15 DIAGNOSIS — K219 Gastro-esophageal reflux disease without esophagitis: Secondary | ICD-10-CM | POA: Diagnosis present

## 2023-01-15 DIAGNOSIS — G96198 Other disorders of meninges, not elsewhere classified: Secondary | ICD-10-CM | POA: Diagnosis present

## 2023-01-15 DIAGNOSIS — Z79899 Other long term (current) drug therapy: Secondary | ICD-10-CM | POA: Diagnosis not present

## 2023-01-15 DIAGNOSIS — I959 Hypotension, unspecified: Secondary | ICD-10-CM | POA: Diagnosis not present

## 2023-01-15 DIAGNOSIS — N182 Chronic kidney disease, stage 2 (mild): Secondary | ICD-10-CM | POA: Diagnosis present

## 2023-01-15 DIAGNOSIS — J439 Emphysema, unspecified: Secondary | ICD-10-CM | POA: Diagnosis present

## 2023-01-15 DIAGNOSIS — M5031 Other cervical disc degeneration,  high cervical region: Secondary | ICD-10-CM | POA: Diagnosis not present

## 2023-01-15 DIAGNOSIS — G8918 Other acute postprocedural pain: Secondary | ICD-10-CM | POA: Diagnosis not present

## 2023-01-15 DIAGNOSIS — N183 Chronic kidney disease, stage 3 unspecified: Secondary | ICD-10-CM | POA: Diagnosis not present

## 2023-01-15 DIAGNOSIS — G9600 Cerebrospinal fluid leak, unspecified: Secondary | ICD-10-CM | POA: Diagnosis not present

## 2023-01-15 DIAGNOSIS — Z87891 Personal history of nicotine dependence: Secondary | ICD-10-CM | POA: Diagnosis not present

## 2023-01-15 DIAGNOSIS — G9611 Dural tear: Secondary | ICD-10-CM | POA: Diagnosis not present

## 2023-01-15 DIAGNOSIS — J449 Chronic obstructive pulmonary disease, unspecified: Secondary | ICD-10-CM | POA: Diagnosis not present

## 2023-01-15 DIAGNOSIS — Z7982 Long term (current) use of aspirin: Secondary | ICD-10-CM | POA: Diagnosis not present

## 2023-01-15 DIAGNOSIS — I129 Hypertensive chronic kidney disease with stage 1 through stage 4 chronic kidney disease, or unspecified chronic kidney disease: Secondary | ICD-10-CM | POA: Diagnosis present

## 2023-01-16 DIAGNOSIS — G9611 Dural tear: Secondary | ICD-10-CM | POA: Diagnosis not present

## 2023-01-16 DIAGNOSIS — G8918 Other acute postprocedural pain: Secondary | ICD-10-CM | POA: Diagnosis not present

## 2023-01-16 DIAGNOSIS — J449 Chronic obstructive pulmonary disease, unspecified: Secondary | ICD-10-CM | POA: Diagnosis not present

## 2023-01-16 DIAGNOSIS — N182 Chronic kidney disease, stage 2 (mild): Secondary | ICD-10-CM | POA: Diagnosis not present

## 2023-01-16 DIAGNOSIS — G9600 Cerebrospinal fluid leak, unspecified: Secondary | ICD-10-CM | POA: Diagnosis not present

## 2023-01-16 DIAGNOSIS — I959 Hypotension, unspecified: Secondary | ICD-10-CM | POA: Diagnosis not present

## 2023-01-16 DIAGNOSIS — G9782 Other postprocedural complications and disorders of nervous system: Secondary | ICD-10-CM | POA: Diagnosis not present

## 2023-01-17 DIAGNOSIS — G8918 Other acute postprocedural pain: Secondary | ICD-10-CM | POA: Diagnosis not present

## 2023-01-17 DIAGNOSIS — K219 Gastro-esophageal reflux disease without esophagitis: Secondary | ICD-10-CM | POA: Diagnosis not present

## 2023-01-17 DIAGNOSIS — G9782 Other postprocedural complications and disorders of nervous system: Secondary | ICD-10-CM | POA: Diagnosis not present

## 2023-01-17 DIAGNOSIS — N182 Chronic kidney disease, stage 2 (mild): Secondary | ICD-10-CM | POA: Diagnosis not present

## 2023-01-17 DIAGNOSIS — J449 Chronic obstructive pulmonary disease, unspecified: Secondary | ICD-10-CM | POA: Diagnosis not present

## 2023-01-17 DIAGNOSIS — G9600 Cerebrospinal fluid leak, unspecified: Secondary | ICD-10-CM | POA: Diagnosis not present

## 2023-01-17 DIAGNOSIS — G9611 Dural tear: Secondary | ICD-10-CM | POA: Diagnosis not present

## 2023-01-19 DIAGNOSIS — G9600 Cerebrospinal fluid leak, unspecified: Secondary | ICD-10-CM | POA: Diagnosis not present

## 2023-01-19 DIAGNOSIS — G9782 Other postprocedural complications and disorders of nervous system: Secondary | ICD-10-CM | POA: Diagnosis not present

## 2023-01-20 DIAGNOSIS — Z981 Arthrodesis status: Secondary | ICD-10-CM | POA: Diagnosis not present

## 2023-01-20 DIAGNOSIS — M5031 Other cervical disc degeneration,  high cervical region: Secondary | ICD-10-CM | POA: Diagnosis not present

## 2023-01-23 ENCOUNTER — Emergency Department (HOSPITAL_COMMUNITY): Payer: Medicare Other

## 2023-01-23 ENCOUNTER — Emergency Department (HOSPITAL_COMMUNITY)
Admission: EM | Admit: 2023-01-23 | Discharge: 2023-01-23 | Disposition: A | Discharge status: Home / Self Care | Payer: Medicare Other | Attending: Emergency Medicine | Admitting: Emergency Medicine

## 2023-01-23 ENCOUNTER — Encounter (HOSPITAL_COMMUNITY): Payer: Self-pay

## 2023-01-23 ENCOUNTER — Other Ambulatory Visit: Payer: Self-pay

## 2023-01-23 DIAGNOSIS — I441 Atrioventricular block, second degree: Secondary | ICD-10-CM | POA: Diagnosis not present

## 2023-01-23 DIAGNOSIS — G9600 Cerebrospinal fluid leak, unspecified: Secondary | ICD-10-CM | POA: Diagnosis not present

## 2023-01-23 DIAGNOSIS — G039 Meningitis, unspecified: Secondary | ICD-10-CM | POA: Diagnosis present

## 2023-01-23 DIAGNOSIS — I2699 Other pulmonary embolism without acute cor pulmonale: Secondary | ICD-10-CM | POA: Diagnosis not present

## 2023-01-23 DIAGNOSIS — J439 Emphysema, unspecified: Secondary | ICD-10-CM | POA: Diagnosis present

## 2023-01-23 DIAGNOSIS — I615 Nontraumatic intracerebral hemorrhage, intraventricular: Secondary | ICD-10-CM | POA: Diagnosis not present

## 2023-01-23 DIAGNOSIS — R4182 Altered mental status, unspecified: Secondary | ICD-10-CM | POA: Diagnosis not present

## 2023-01-23 DIAGNOSIS — R1084 Generalized abdominal pain: Secondary | ICD-10-CM | POA: Diagnosis not present

## 2023-01-23 DIAGNOSIS — E872 Acidosis, unspecified: Secondary | ICD-10-CM | POA: Diagnosis not present

## 2023-01-23 DIAGNOSIS — E878 Other disorders of electrolyte and fluid balance, not elsewhere classified: Secondary | ICD-10-CM | POA: Diagnosis present

## 2023-01-23 DIAGNOSIS — M546 Pain in thoracic spine: Secondary | ICD-10-CM | POA: Diagnosis not present

## 2023-01-23 DIAGNOSIS — G038 Meningitis due to other specified causes: Secondary | ICD-10-CM | POA: Diagnosis not present

## 2023-01-23 DIAGNOSIS — N183 Chronic kidney disease, stage 3 unspecified: Secondary | ICD-10-CM | POA: Diagnosis not present

## 2023-01-23 DIAGNOSIS — J449 Chronic obstructive pulmonary disease, unspecified: Secondary | ICD-10-CM | POA: Diagnosis not present

## 2023-01-23 DIAGNOSIS — K5730 Diverticulosis of large intestine without perforation or abscess without bleeding: Secondary | ICD-10-CM | POA: Diagnosis not present

## 2023-01-23 DIAGNOSIS — N281 Cyst of kidney, acquired: Secondary | ICD-10-CM | POA: Diagnosis not present

## 2023-01-23 DIAGNOSIS — E782 Mixed hyperlipidemia: Secondary | ICD-10-CM | POA: Diagnosis present

## 2023-01-23 DIAGNOSIS — I472 Ventricular tachycardia, unspecified: Secondary | ICD-10-CM | POA: Diagnosis not present

## 2023-01-23 DIAGNOSIS — Z7982 Long term (current) use of aspirin: Secondary | ICD-10-CM | POA: Diagnosis not present

## 2023-01-23 DIAGNOSIS — G9340 Encephalopathy, unspecified: Secondary | ICD-10-CM | POA: Diagnosis not present

## 2023-01-23 DIAGNOSIS — Z982 Presence of cerebrospinal fluid drainage device: Secondary | ICD-10-CM | POA: Diagnosis not present

## 2023-01-23 DIAGNOSIS — I440 Atrioventricular block, first degree: Secondary | ICD-10-CM | POA: Diagnosis present

## 2023-01-23 DIAGNOSIS — I4430 Unspecified atrioventricular block: Secondary | ICD-10-CM | POA: Diagnosis not present

## 2023-01-23 DIAGNOSIS — Z79899 Other long term (current) drug therapy: Secondary | ICD-10-CM | POA: Diagnosis not present

## 2023-01-23 DIAGNOSIS — R410 Disorientation, unspecified: Secondary | ICD-10-CM | POA: Diagnosis not present

## 2023-01-23 DIAGNOSIS — F419 Anxiety disorder, unspecified: Secondary | ICD-10-CM | POA: Diagnosis present

## 2023-01-23 DIAGNOSIS — R489 Unspecified symbolic dysfunctions: Secondary | ICD-10-CM | POA: Diagnosis not present

## 2023-01-23 DIAGNOSIS — G9389 Other specified disorders of brain: Secondary | ICD-10-CM | POA: Diagnosis not present

## 2023-01-23 DIAGNOSIS — R1312 Dysphagia, oropharyngeal phase: Secondary | ICD-10-CM | POA: Diagnosis present

## 2023-01-23 DIAGNOSIS — G9341 Metabolic encephalopathy: Secondary | ICD-10-CM | POA: Diagnosis present

## 2023-01-23 DIAGNOSIS — Z4659 Encounter for fitting and adjustment of other gastrointestinal appliance and device: Secondary | ICD-10-CM | POA: Diagnosis not present

## 2023-01-23 DIAGNOSIS — Z8546 Personal history of malignant neoplasm of prostate: Secondary | ICD-10-CM | POA: Diagnosis not present

## 2023-01-23 DIAGNOSIS — Z452 Encounter for adjustment and management of vascular access device: Secondary | ICD-10-CM | POA: Diagnosis not present

## 2023-01-23 DIAGNOSIS — Z905 Acquired absence of kidney: Secondary | ICD-10-CM | POA: Diagnosis not present

## 2023-01-23 DIAGNOSIS — E785 Hyperlipidemia, unspecified: Secondary | ICD-10-CM | POA: Diagnosis not present

## 2023-01-23 DIAGNOSIS — N182 Chronic kidney disease, stage 2 (mild): Secondary | ICD-10-CM | POA: Diagnosis present

## 2023-01-23 DIAGNOSIS — G9782 Other postprocedural complications and disorders of nervous system: Secondary | ICD-10-CM | POA: Diagnosis present

## 2023-01-23 DIAGNOSIS — J984 Other disorders of lung: Secondary | ICD-10-CM | POA: Diagnosis not present

## 2023-01-23 DIAGNOSIS — Z981 Arthrodesis status: Secondary | ICD-10-CM | POA: Diagnosis not present

## 2023-01-23 DIAGNOSIS — R931 Abnormal findings on diagnostic imaging of heart and coronary circulation: Secondary | ICD-10-CM | POA: Diagnosis not present

## 2023-01-23 DIAGNOSIS — E8809 Other disorders of plasma-protein metabolism, not elsewhere classified: Secondary | ICD-10-CM | POA: Diagnosis present

## 2023-01-23 DIAGNOSIS — M542 Cervicalgia: Secondary | ICD-10-CM | POA: Diagnosis not present

## 2023-01-23 DIAGNOSIS — K4020 Bilateral inguinal hernia, without obstruction or gangrene, not specified as recurrent: Secondary | ICD-10-CM | POA: Diagnosis not present

## 2023-01-23 DIAGNOSIS — I10 Essential (primary) hypertension: Secondary | ICD-10-CM | POA: Insufficient documentation

## 2023-01-23 DIAGNOSIS — R531 Weakness: Secondary | ICD-10-CM | POA: Insufficient documentation

## 2023-01-23 DIAGNOSIS — D3610 Benign neoplasm of peripheral nerves and autonomic nervous system, unspecified: Secondary | ICD-10-CM | POA: Diagnosis not present

## 2023-01-23 DIAGNOSIS — K219 Gastro-esophageal reflux disease without esophagitis: Secondary | ICD-10-CM | POA: Diagnosis not present

## 2023-01-23 DIAGNOSIS — J9690 Respiratory failure, unspecified, unspecified whether with hypoxia or hypercapnia: Secondary | ICD-10-CM | POA: Diagnosis not present

## 2023-01-23 DIAGNOSIS — Q283 Other malformations of cerebral vessels: Secondary | ICD-10-CM | POA: Diagnosis not present

## 2023-01-23 DIAGNOSIS — I4510 Unspecified right bundle-branch block: Secondary | ICD-10-CM | POA: Diagnosis not present

## 2023-01-23 DIAGNOSIS — J9600 Acute respiratory failure, unspecified whether with hypoxia or hypercapnia: Secondary | ICD-10-CM | POA: Diagnosis not present

## 2023-01-23 DIAGNOSIS — J9811 Atelectasis: Secondary | ICD-10-CM | POA: Diagnosis not present

## 2023-01-23 DIAGNOSIS — G9609 Other spinal cerebrospinal fluid leak: Secondary | ICD-10-CM | POA: Diagnosis not present

## 2023-01-23 DIAGNOSIS — Z85528 Personal history of other malignant neoplasm of kidney: Secondary | ICD-10-CM | POA: Insufficient documentation

## 2023-01-23 DIAGNOSIS — Z87891 Personal history of nicotine dependence: Secondary | ICD-10-CM | POA: Diagnosis not present

## 2023-01-23 DIAGNOSIS — R109 Unspecified abdominal pain: Secondary | ICD-10-CM | POA: Diagnosis not present

## 2023-01-23 DIAGNOSIS — M545 Low back pain, unspecified: Secondary | ICD-10-CM | POA: Diagnosis not present

## 2023-01-23 DIAGNOSIS — R918 Other nonspecific abnormal finding of lung field: Secondary | ICD-10-CM | POA: Diagnosis not present

## 2023-01-23 DIAGNOSIS — R836 Abnormal cytological findings in cerebrospinal fluid: Secondary | ICD-10-CM | POA: Diagnosis not present

## 2023-01-23 DIAGNOSIS — D631 Anemia in chronic kidney disease: Secondary | ICD-10-CM | POA: Diagnosis present

## 2023-01-23 DIAGNOSIS — I129 Hypertensive chronic kidney disease with stage 1 through stage 4 chronic kidney disease, or unspecified chronic kidney disease: Secondary | ICD-10-CM | POA: Diagnosis not present

## 2023-01-23 DIAGNOSIS — G96198 Other disorders of meninges, not elsewhere classified: Secondary | ICD-10-CM | POA: Diagnosis not present

## 2023-01-23 DIAGNOSIS — J90 Pleural effusion, not elsewhere classified: Secondary | ICD-10-CM | POA: Diagnosis not present

## 2023-01-23 DIAGNOSIS — M4803 Spinal stenosis, cervicothoracic region: Secondary | ICD-10-CM | POA: Diagnosis not present

## 2023-01-23 DIAGNOSIS — G253 Myoclonus: Secondary | ICD-10-CM | POA: Diagnosis not present

## 2023-01-23 DIAGNOSIS — D751 Secondary polycythemia: Secondary | ICD-10-CM | POA: Diagnosis not present

## 2023-01-23 DIAGNOSIS — J9809 Other diseases of bronchus, not elsewhere classified: Secondary | ICD-10-CM | POA: Diagnosis not present

## 2023-01-23 LAB — CBC WITH DIFFERENTIAL/PLATELET
Abs Immature Granulocytes: 0.08 10*3/uL — ABNORMAL HIGH (ref 0.00–0.07)
Basophils Absolute: 0 10*3/uL (ref 0.0–0.1)
Basophils Relative: 1 %
Eosinophils Absolute: 0.1 10*3/uL (ref 0.0–0.5)
Eosinophils Relative: 2 %
HCT: 46.6 % (ref 39.0–52.0)
Hemoglobin: 15.1 g/dL (ref 13.0–17.0)
Immature Granulocytes: 1 %
Lymphocytes Relative: 11 %
Lymphs Abs: 0.9 10*3/uL (ref 0.7–4.0)
MCH: 27.7 pg (ref 26.0–34.0)
MCHC: 32.4 g/dL (ref 30.0–36.0)
MCV: 85.5 fL (ref 80.0–100.0)
Monocytes Absolute: 0.8 10*3/uL (ref 0.1–1.0)
Monocytes Relative: 10 %
Neutro Abs: 5.9 10*3/uL (ref 1.7–7.7)
Neutrophils Relative %: 75 %
Platelets: 312 10*3/uL (ref 150–400)
RBC: 5.45 MIL/uL (ref 4.22–5.81)
RDW: 13.3 % (ref 11.5–15.5)
WBC: 7.8 10*3/uL (ref 4.0–10.5)
nRBC: 0 % (ref 0.0–0.2)

## 2023-01-23 LAB — I-STAT CHEM 8, ED
BUN: 22 mg/dL (ref 8–23)
Calcium, Ion: 1.32 mmol/L (ref 1.15–1.40)
Chloride: 107 mmol/L (ref 98–111)
Creatinine, Ser: 1.2 mg/dL (ref 0.61–1.24)
Glucose, Bld: 129 mg/dL — ABNORMAL HIGH (ref 70–99)
HCT: 47 % (ref 39.0–52.0)
Hemoglobin: 16 g/dL (ref 13.0–17.0)
Potassium: 3.7 mmol/L (ref 3.5–5.1)
Sodium: 141 mmol/L (ref 135–145)
TCO2: 22 mmol/L (ref 22–32)

## 2023-01-23 LAB — COMPREHENSIVE METABOLIC PANEL
ALT: 13 U/L (ref 0–44)
AST: 20 U/L (ref 15–41)
Albumin: 3.5 g/dL (ref 3.5–5.0)
Alkaline Phosphatase: 67 U/L (ref 38–126)
Anion gap: 9 (ref 5–15)
BUN: 20 mg/dL (ref 8–23)
CO2: 23 mmol/L (ref 22–32)
Calcium: 10.2 mg/dL (ref 8.9–10.3)
Chloride: 107 mmol/L (ref 98–111)
Creatinine, Ser: 1.19 mg/dL (ref 0.61–1.24)
GFR, Estimated: >60 mL/min (ref >60)
Glucose, Bld: 131 mg/dL — ABNORMAL HIGH (ref 70–99)
Potassium: 3.8 mmol/L (ref 3.5–5.1)
Sodium: 139 mmol/L (ref 135–145)
Total Bilirubin: 0.7 mg/dL (ref <1.2)
Total Protein: 7.4 g/dL (ref 6.5–8.1)

## 2023-01-23 LAB — URINALYSIS, ROUTINE W REFLEX MICROSCOPIC
Bilirubin Urine: NEGATIVE
Glucose, UA: NEGATIVE mg/dL
Hgb urine dipstick: NEGATIVE
Ketones, ur: NEGATIVE mg/dL
Leukocytes,Ua: NEGATIVE
Nitrite: NEGATIVE
Protein, ur: NEGATIVE mg/dL
Specific Gravity, Urine: 1.019 (ref 1.005–1.030)
pH: 7 (ref 5.0–8.0)

## 2023-01-23 LAB — I-STAT CG4 LACTIC ACID, ED: Lactic Acid, Venous: 0.9 mmol/L (ref 0.5–1.9)

## 2023-01-23 MED ORDER — VANCOMYCIN HCL 1500 MG/300ML IV SOLN
1500.0000 mg | Freq: Once | INTRAVENOUS | Status: AC
  Administered 2023-01-23: 1500 mg via INTRAVENOUS
  Filled 2023-01-23: qty 300

## 2023-01-23 MED ORDER — SODIUM CHLORIDE 0.9 % IV SOLN
2.0000 g | Freq: Once | INTRAVENOUS | Status: AC
  Administered 2023-01-23: 2 g via INTRAVENOUS
  Filled 2023-01-23: qty 20

## 2023-01-23 MED ORDER — GADOBUTROL 1 MMOL/ML IV SOLN
8.0000 mL | Freq: Once | INTRAVENOUS | Status: AC | PRN
  Administered 2023-01-23: 8 mL via INTRAVENOUS

## 2023-01-23 MED ORDER — MORPHINE SULFATE (PF) 4 MG/ML IV SOLN
4.0000 mg | Freq: Once | INTRAVENOUS | Status: AC
  Administered 2023-01-23: 4 mg via INTRAVENOUS
  Filled 2023-01-23: qty 1

## 2023-01-23 MED ORDER — IOHEXOL 350 MG/ML SOLN
75.0000 mL | Freq: Once | INTRAVENOUS | Status: AC | PRN
  Administered 2023-01-23: 75 mL via INTRAVENOUS

## 2023-01-23 MED ORDER — ONDANSETRON HCL 4 MG/2ML IJ SOLN
4.0000 mg | Freq: Once | INTRAMUSCULAR | Status: AC
  Administered 2023-01-23: 4 mg via INTRAVENOUS
  Filled 2023-01-23: qty 2

## 2023-01-23 NOTE — ED Provider Notes (Signed)
[place] EMERGENCY DEPARTMENT AT [facility] Provider Note   CSN: [id number] Arrival date & time: 01/23/23  7846     History  Chief Complaint  Patient presents with   Altered Mental Status    [name] is a 77 y.o. male patient with past medical history of polycythemia, prostate cancer, metastatic renal cell carcinoma, hypertension presenting to emergency room with weakness.  Patient had recent surgery for dural tear.  Surgery was performed with [facility].  Patient had surgery 8 days ago.  He was released from the hospital 3 days ago and was originally able to ambulate with a walker however yesterday he started having increased generalized weakness and confusion.  His wife states she is unsure if this is related to medications.  Last night the patient felt very weak was able to slowly lower himself to the ground, EMS had to come and help him stand up.  This morning patient still had not improved.  Denies chest pain, cough, abdominal pain, fevers or chills.  Patient's wife notes urinary frequency, feeling like he needs to urinate but unable to fully empty bladder, denies dysuria.   Altered Mental Status      Home Medications Prior to Admission medications   Medication Sig Start Date End Date Taking? Authorizing Provider  aspirin EC 81 MG tablet Take 81 mg by mouth daily. Swallow whole.    [provider]  Carboxymethylcellulose Sodium (THERATEARS OP) Place 1 drop into both eyes as needed (dry eyes).    [provider]  fluticasone (FLONASE) 50 MCG/ACT nasal spray Place 1 spray into both nostrils daily as needed for allergies.     [provider]  losartan-hydrochlorothiazide (HYZAAR) 100-25 MG tablet Take 1 tablet by mouth daily. 08/29/18   [provider]  pravastatin (PRAVACHOL) 40 MG tablet Take 40 mg by mouth daily.    [provider]      Allergies    Oxycodone and Septra [sulfamethoxazole-trimethoprim]    Review of  Systems   Review of Systems  Physical Exam Updated Vital Signs BP 133/83 (BP Location: Right Arm)   Pulse 61   Temp 97.8 F (36.6 C) (Oral)   Ht 5\' 9"  (1.753 m)   Wt 81.6 kg   SpO2 100%   BMI 26.58 kg/m  Physical Exam  ED Results / Procedures / Treatments   Labs (all labs ordered are listed, but only abnormal results are displayed) Labs Reviewed  CBC WITH DIFFERENTIAL/PLATELET - Abnormal; Notable for the following components:      Result Value   Abs Immature Granulocytes 0.08 (*)    All other components within normal limits  COMPREHENSIVE METABOLIC PANEL - Abnormal; Notable for the following components:   Glucose, Bld 131 (*)    All other components within normal limits  URINALYSIS, ROUTINE W REFLEX MICROSCOPIC - Abnormal; Notable for the following components:   APPearance HAZY (*)    All other components within normal limits  I-STAT CHEM 8, ED - Abnormal; Notable for the following components:   Glucose, Bld 129 (*)    All other components within normal limits  I-STAT CG4 LACTIC ACID, ED  I-STAT CG4 LACTIC ACID, ED    EKG EKG Interpretation Date/Time:  Saturday January 23 2023 06:28:13 EST Ventricular Rate:  61 PR Interval:  292 QRS Duration:  141 QT Interval:  305 QTC Calculation: 308 R Axis:   -49  Text Interpretation: Sinus rhythm Prolonged PR interval RBBB and LAFB Inferior infarct, old  no significant change since Jun 2024 Confirmed by Pricilla Loveless [phone]) on 01/23/2023 9:13:59 AM  Radiology No results found.  Procedures Procedures    Medications Ordered in ED Medications - No data to display  ED Course/ Medical Decision Making/ A&P                                 Medical Decision Making Amount and/or Complexity of Data Reviewed Labs: ordered. Radiology: ordered.  Risk Prescription drug management.   [name] 77 y.o. presented today for weakness. Working DDx that I considered at this time includes, but not limited to, CVA/TIA,  arrhythmia, vertigo, medication s/e, orthostatic hypotension, electrolyte abnormalities, dehydration, URI, ACS, UTI, anemia   R/o DDx: These are considered less likely than current impression due to history of present illness, physical exam, lab/imaging findings   Review of prior external notes: 01/15/2023 - Postoperative CSF leak, procedure for dural repair s/p laminectomy   Pmhx: Cervical spine procedure, spinal axis tumor, history of prostate cancer, history of kidney neoplasm with nephrectomy  Unique Tests and My Interpretation:  CBC with differential: No leukocytosis, no anemia CMP: No electrolyte abnormality, glucose 131, no AKI Lactic 0.9 UA: Negative for nitrates, leukocytes   EKG: Rate, rhythm, axis, intervals all examined: Sinus rhythm, prolonged PR   Imaging:  CXR: Stable findings of left atelectasis with no acute changes CT of head no intracranial abnormality  CT abdomen and pelvis no acute abdominal pathology  MRI cervical, thoracic, and lumbar w and wo pending   Patient's bladder appears distended to me on imaging.  Will obtain postvoid residual.  Urinalysis is negative for urinary tract infection.  Problem List / ED Course / Critical interventions / Medication management  Patient reporting to emergency room 8 days postop spinal procedure.  Patient was discharged from hospital 3 days ago.  Patient typically able to ambulate with walker was able to ambulate with walker after surgery however the last 2 days had increased generalized weakness.  Patient's wife also notes some confusion does not feel he is acting himself.  She reports he was doing better 3 days ago.  Patient is slow to respond however alert and obeying commands.  No focal neurological weakness on exam.  Patient is having urinary retention thus a Foley catheter was placed.  No acute findings on head CT or abdominal scan.  MR cervical, thoracic, lumbar ordered. I ordered medication including Morphine  for pain   Reevaluation of the patient after these medicines showed that the patient stayed the same Patients vitals assessed. Upon arrival patient is hemodynamically stable.  I have reviewed the patients home medicines and have made adjustments as needed  Consult: None  Plan:  Pending MRI results Passed off to oncoming provider.  Patient will likely require admission for confusion and increased weakness, unable to ambulate.          Final Clinical Impression(s) / ED Diagnoses Final diagnoses:  None    Rx / DC Orders ED Discharge Orders     None         Smitty Knudsen, PA-C 01/23/23 1515    Pricilla Loveless, MD 01/24/23 1025

## 2023-01-23 NOTE — ED Provider Notes (Cosign Needed Addendum)
Physical Exam  BP (!) 126/104   Pulse 60   Temp 97.8 F (36.6 C) (Oral)   Resp 15   Ht 5\' 9"  (1.753 m)   Wt 81.6 kg   SpO2 100%   BMI 26.58 kg/m   Physical Exam  Procedures  Procedures  ED Course / MDM   Medical Decision Making Amount and/or Complexity of Data Reviewed Labs: ordered. Radiology: ordered.  Risk Prescription drug management.   Signout from [employer] provider as follows: This is a 77 y/o male with PMH PCV, prostate cancer, metastatic renal cell carcinoma, HTN, prior spinal fusion c/b postop CSF leak s/p extending spinal fusion to C3-T1 and a dural repair with a lumbar drain on 11/1 at [facility] (POD#8) by Dr. Kevan Rosebush, discharged 3 days ago, who presents for worsening generalized weakness and complaints of confusion by the wife. He was ambulatory previously but now so weak he cannot walk for the past 1 to 2 days. He was also having urinary frequency and incomplete voiding sensation since then. Hemodynamically stable here, no focal neuro deficits on initial provider's exam, A&Ox4, answering questions appropriately though reportedly confused still per his wife. Work up so far shows no leukocytosis, stable hemoglobin of 15, normal electrolytes and no evidence of metabolic derangement on CMP, normal renal function LFTs, no UTI on UA normal lactate, head without contrast no acute abnormality, CT abdomen pelvis with urinary retention no other acute pathology, chest x-ray negative for acute pathology.   A foley has been placed for urinary retention. MRI of C, T, an L spine are in process to rule out postop complication, abscess, or other etiology for his weakness. Plan at signout is to f/u MRI and reassess patient. If MRI negative and still unable to walk, will need admission for PT/OT.  At this point in patient's workup I assumed care from offgoing APP.  Regarding CT C spine I was called by radiology. There is a fluid collection at C7-T1, could be infection or evolving post  surgical change. No high grade stenosis in C, T, or L spine. Diffuse leptomeningeal contrast enhancement down to nerve roots, not just isolated to the C spine as would expect if it was entirely post surgical, and a small fluid collection at S4 level. DDX includes meningitis + arachnoiditis or leptomeningeal carcinomatosis (from known renal cell carcinoma).   I reassessed the patient at this time he continues to be hemodynamically stable.  He has evidence of altered mental status compared to what the family member at bedside describes that this is baseline.  He is usually normal and conversant without any deficits.  Currently he is intermittently responsive to voice and does not answer questions appropriately, does not make words and is just moaning. He is unable to answer orientation questions but is alert.  I evaluated his postsurgical incision at the midline of his cervical spine which appears clean dry and intact.  Due to extensive fusion I am not really able to range his neck to assess for rigidity.  Attempted hip flexion and extension, it causes him discomfort with the right leg but not the left, inconclusive for meningitis; though exam is limited by how altered he is.  I discussed the CT findings with patient and his wife particularly the concern for infection. Patient noted to have rigors at this time. Out of concern for meningitis and possibly evolving fever evidenced by rigor, started on Vanc and Ceftriaxone after blood cultures were obtained.    I discussed the patient with [facility]  transfer center who discussed the patient with Dr. Ermalinda Memos. They have accepted the patient in transfer and request he come ASAP to the [facility] ED. I updated patient's family. Patient's records from this encounter were compiled. Nursing called report to ED Charge at [phone]. Transported from ED in stable condition.    Karmen Stabs, MD 01/23/23 2249    Gwyneth Sprout, MD 01/24/23 [phone]

## 2023-01-23 NOTE — ED Notes (Signed)
Pt's wife, [name], left to go home while awaiting transfer to [facility]. She left contact number for any updates. [phone]

## 2023-01-23 NOTE — ED Notes (Signed)
Notified RN that pt needs leads to be put back on

## 2023-01-23 NOTE — ED Notes (Signed)
[facility] here for transport to [facility]

## 2023-01-23 NOTE — ED Notes (Signed)
Pt's wife called and made aware that pt being transported to [facility] as discussed when she was here earlier. Allyn Kenner, wife, gave verbal authorization for transport to this RN and 2nd RN, Bryan Lemma.

## 2023-01-23 NOTE — ED Notes (Signed)
Pt had a sustained run of v-tach and became unresponsive but never lost pulses. MD notified, patient placed on zoll monitor. Wife at bedside educated on care being provided to the patient.

## 2023-01-23 NOTE — ED Notes (Signed)
Bladder scan showed 

## 2023-01-23 NOTE — ED Notes (Signed)
[facility] has been called for transport to [facility] ED to ED

## 2023-01-23 NOTE — ED Notes (Signed)
Patient transported to MRI 

## 2023-01-23 NOTE — ED Triage Notes (Signed)
Pt BIBEMS from home. Pt had spina; sx at [facility] 8 days ago and sent home 3 days ago. Wife gave normal night meds at 2045 an pt became confused and unable to stand. EMS was called, helped back to bed and pt rfused to be transported. Wife called again at 0530 as pt not improved. Pt able to state name, correct year and place.

## 2023-01-24 DIAGNOSIS — Z85528 Personal history of other malignant neoplasm of kidney: Secondary | ICD-10-CM | POA: Diagnosis not present

## 2023-01-24 DIAGNOSIS — E785 Hyperlipidemia, unspecified: Secondary | ICD-10-CM | POA: Diagnosis not present

## 2023-01-24 DIAGNOSIS — G038 Meningitis due to other specified causes: Secondary | ICD-10-CM | POA: Diagnosis not present

## 2023-01-24 DIAGNOSIS — I2699 Other pulmonary embolism without acute cor pulmonale: Secondary | ICD-10-CM | POA: Diagnosis not present

## 2023-01-24 DIAGNOSIS — G039 Meningitis, unspecified: Secondary | ICD-10-CM | POA: Diagnosis present

## 2023-01-24 DIAGNOSIS — D3610 Benign neoplasm of peripheral nerves and autonomic nervous system, unspecified: Secondary | ICD-10-CM | POA: Diagnosis not present

## 2023-01-24 DIAGNOSIS — I472 Ventricular tachycardia, unspecified: Secondary | ICD-10-CM | POA: Diagnosis not present

## 2023-01-24 DIAGNOSIS — I441 Atrioventricular block, second degree: Secondary | ICD-10-CM | POA: Diagnosis not present

## 2023-01-24 DIAGNOSIS — J90 Pleural effusion, not elsewhere classified: Secondary | ICD-10-CM | POA: Diagnosis not present

## 2023-01-24 DIAGNOSIS — J9809 Other diseases of bronchus, not elsewhere classified: Secondary | ICD-10-CM | POA: Diagnosis not present

## 2023-01-24 DIAGNOSIS — R836 Abnormal cytological findings in cerebrospinal fluid: Secondary | ICD-10-CM | POA: Diagnosis not present

## 2023-01-24 DIAGNOSIS — Z7982 Long term (current) use of aspirin: Secondary | ICD-10-CM | POA: Diagnosis not present

## 2023-01-24 DIAGNOSIS — K219 Gastro-esophageal reflux disease without esophagitis: Secondary | ICD-10-CM | POA: Diagnosis present

## 2023-01-24 DIAGNOSIS — Z79899 Other long term (current) drug therapy: Secondary | ICD-10-CM | POA: Diagnosis not present

## 2023-01-24 DIAGNOSIS — J984 Other disorders of lung: Secondary | ICD-10-CM | POA: Diagnosis not present

## 2023-01-24 DIAGNOSIS — G9782 Other postprocedural complications and disorders of nervous system: Secondary | ICD-10-CM | POA: Diagnosis present

## 2023-01-24 DIAGNOSIS — G9341 Metabolic encephalopathy: Secondary | ICD-10-CM | POA: Diagnosis present

## 2023-01-24 DIAGNOSIS — Q283 Other malformations of cerebral vessels: Secondary | ICD-10-CM | POA: Diagnosis not present

## 2023-01-24 DIAGNOSIS — I615 Nontraumatic intracerebral hemorrhage, intraventricular: Secondary | ICD-10-CM | POA: Diagnosis not present

## 2023-01-24 DIAGNOSIS — I440 Atrioventricular block, first degree: Secondary | ICD-10-CM | POA: Diagnosis present

## 2023-01-24 DIAGNOSIS — D751 Secondary polycythemia: Secondary | ICD-10-CM | POA: Diagnosis present

## 2023-01-24 DIAGNOSIS — G9389 Other specified disorders of brain: Secondary | ICD-10-CM | POA: Diagnosis not present

## 2023-01-24 DIAGNOSIS — E872 Acidosis, unspecified: Secondary | ICD-10-CM | POA: Diagnosis not present

## 2023-01-24 DIAGNOSIS — I4510 Unspecified right bundle-branch block: Secondary | ICD-10-CM | POA: Diagnosis not present

## 2023-01-24 DIAGNOSIS — Z982 Presence of cerebrospinal fluid drainage device: Secondary | ICD-10-CM | POA: Diagnosis not present

## 2023-01-24 DIAGNOSIS — E782 Mixed hyperlipidemia: Secondary | ICD-10-CM | POA: Diagnosis present

## 2023-01-24 DIAGNOSIS — N183 Chronic kidney disease, stage 3 unspecified: Secondary | ICD-10-CM | POA: Diagnosis not present

## 2023-01-24 DIAGNOSIS — G253 Myoclonus: Secondary | ICD-10-CM | POA: Diagnosis not present

## 2023-01-24 DIAGNOSIS — R489 Unspecified symbolic dysfunctions: Secondary | ICD-10-CM | POA: Diagnosis not present

## 2023-01-24 DIAGNOSIS — G9609 Other spinal cerebrospinal fluid leak: Secondary | ICD-10-CM | POA: Diagnosis not present

## 2023-01-24 DIAGNOSIS — F419 Anxiety disorder, unspecified: Secondary | ICD-10-CM | POA: Diagnosis present

## 2023-01-24 DIAGNOSIS — N182 Chronic kidney disease, stage 2 (mild): Secondary | ICD-10-CM | POA: Diagnosis present

## 2023-01-24 DIAGNOSIS — J9600 Acute respiratory failure, unspecified whether with hypoxia or hypercapnia: Secondary | ICD-10-CM | POA: Diagnosis not present

## 2023-01-24 DIAGNOSIS — R918 Other nonspecific abnormal finding of lung field: Secondary | ICD-10-CM | POA: Diagnosis not present

## 2023-01-24 DIAGNOSIS — Z87891 Personal history of nicotine dependence: Secondary | ICD-10-CM | POA: Diagnosis not present

## 2023-01-24 DIAGNOSIS — R4182 Altered mental status, unspecified: Secondary | ICD-10-CM | POA: Diagnosis not present

## 2023-01-24 DIAGNOSIS — Z981 Arthrodesis status: Secondary | ICD-10-CM | POA: Diagnosis not present

## 2023-01-24 DIAGNOSIS — G96198 Other disorders of meninges, not elsewhere classified: Secondary | ICD-10-CM | POA: Diagnosis not present

## 2023-01-24 DIAGNOSIS — I129 Hypertensive chronic kidney disease with stage 1 through stage 4 chronic kidney disease, or unspecified chronic kidney disease: Secondary | ICD-10-CM | POA: Diagnosis present

## 2023-01-24 DIAGNOSIS — J439 Emphysema, unspecified: Secondary | ICD-10-CM | POA: Diagnosis present

## 2023-01-24 DIAGNOSIS — I4430 Unspecified atrioventricular block: Secondary | ICD-10-CM | POA: Diagnosis not present

## 2023-01-24 DIAGNOSIS — I10 Essential (primary) hypertension: Secondary | ICD-10-CM | POA: Diagnosis not present

## 2023-01-24 DIAGNOSIS — R931 Abnormal findings on diagnostic imaging of heart and coronary circulation: Secondary | ICD-10-CM | POA: Diagnosis not present

## 2023-01-24 DIAGNOSIS — Z452 Encounter for adjustment and management of vascular access device: Secondary | ICD-10-CM | POA: Diagnosis not present

## 2023-01-24 DIAGNOSIS — J9690 Respiratory failure, unspecified, unspecified whether with hypoxia or hypercapnia: Secondary | ICD-10-CM | POA: Diagnosis not present

## 2023-01-24 DIAGNOSIS — Z905 Acquired absence of kidney: Secondary | ICD-10-CM | POA: Diagnosis not present

## 2023-01-24 DIAGNOSIS — R1312 Dysphagia, oropharyngeal phase: Secondary | ICD-10-CM | POA: Diagnosis present

## 2023-01-24 DIAGNOSIS — E878 Other disorders of electrolyte and fluid balance, not elsewhere classified: Secondary | ICD-10-CM | POA: Diagnosis present

## 2023-01-24 DIAGNOSIS — D631 Anemia in chronic kidney disease: Secondary | ICD-10-CM | POA: Diagnosis present

## 2023-01-24 DIAGNOSIS — E8809 Other disorders of plasma-protein metabolism, not elsewhere classified: Secondary | ICD-10-CM | POA: Diagnosis present

## 2023-01-24 DIAGNOSIS — J449 Chronic obstructive pulmonary disease, unspecified: Secondary | ICD-10-CM | POA: Diagnosis not present

## 2023-01-24 DIAGNOSIS — G9340 Encephalopathy, unspecified: Secondary | ICD-10-CM | POA: Diagnosis not present

## 2023-01-24 DIAGNOSIS — Z4659 Encounter for fitting and adjustment of other gastrointestinal appliance and device: Secondary | ICD-10-CM | POA: Diagnosis not present

## 2023-01-24 DIAGNOSIS — R1084 Generalized abdominal pain: Secondary | ICD-10-CM | POA: Diagnosis not present

## 2023-01-24 DIAGNOSIS — G9600 Cerebrospinal fluid leak, unspecified: Secondary | ICD-10-CM | POA: Diagnosis not present

## 2023-01-25 DIAGNOSIS — Z4659 Encounter for fitting and adjustment of other gastrointestinal appliance and device: Secondary | ICD-10-CM | POA: Diagnosis not present

## 2023-01-25 DIAGNOSIS — R1084 Generalized abdominal pain: Secondary | ICD-10-CM | POA: Diagnosis not present

## 2023-01-25 DIAGNOSIS — G038 Meningitis due to other specified causes: Secondary | ICD-10-CM | POA: Diagnosis not present

## 2023-01-25 DIAGNOSIS — G96198 Other disorders of meninges, not elsewhere classified: Secondary | ICD-10-CM | POA: Diagnosis not present

## 2023-01-25 DIAGNOSIS — G9782 Other postprocedural complications and disorders of nervous system: Secondary | ICD-10-CM | POA: Diagnosis not present

## 2023-01-25 DIAGNOSIS — R4182 Altered mental status, unspecified: Secondary | ICD-10-CM | POA: Diagnosis not present

## 2023-01-25 DIAGNOSIS — G039 Meningitis, unspecified: Secondary | ICD-10-CM | POA: Diagnosis not present

## 2023-01-26 DIAGNOSIS — G038 Meningitis due to other specified causes: Secondary | ICD-10-CM | POA: Diagnosis not present

## 2023-01-26 DIAGNOSIS — R1084 Generalized abdominal pain: Secondary | ICD-10-CM | POA: Diagnosis not present

## 2023-01-26 DIAGNOSIS — J9809 Other diseases of bronchus, not elsewhere classified: Secondary | ICD-10-CM | POA: Diagnosis not present

## 2023-01-26 DIAGNOSIS — R918 Other nonspecific abnormal finding of lung field: Secondary | ICD-10-CM | POA: Diagnosis not present

## 2023-01-26 DIAGNOSIS — G9389 Other specified disorders of brain: Secondary | ICD-10-CM | POA: Diagnosis not present

## 2023-01-26 DIAGNOSIS — I615 Nontraumatic intracerebral hemorrhage, intraventricular: Secondary | ICD-10-CM | POA: Diagnosis not present

## 2023-01-26 DIAGNOSIS — G9782 Other postprocedural complications and disorders of nervous system: Secondary | ICD-10-CM | POA: Diagnosis not present

## 2023-01-26 DIAGNOSIS — Z982 Presence of cerebrospinal fluid drainage device: Secondary | ICD-10-CM | POA: Diagnosis not present

## 2023-01-26 DIAGNOSIS — G96198 Other disorders of meninges, not elsewhere classified: Secondary | ICD-10-CM | POA: Diagnosis not present

## 2023-01-27 DIAGNOSIS — R918 Other nonspecific abnormal finding of lung field: Secondary | ICD-10-CM | POA: Diagnosis not present

## 2023-01-27 DIAGNOSIS — G9340 Encephalopathy, unspecified: Secondary | ICD-10-CM | POA: Diagnosis not present

## 2023-01-27 DIAGNOSIS — G038 Meningitis due to other specified causes: Secondary | ICD-10-CM | POA: Diagnosis not present

## 2023-01-27 DIAGNOSIS — R1312 Dysphagia, oropharyngeal phase: Secondary | ICD-10-CM | POA: Diagnosis not present

## 2023-01-27 DIAGNOSIS — R1084 Generalized abdominal pain: Secondary | ICD-10-CM | POA: Diagnosis not present

## 2023-01-27 DIAGNOSIS — G9782 Other postprocedural complications and disorders of nervous system: Secondary | ICD-10-CM | POA: Diagnosis not present

## 2023-01-28 DIAGNOSIS — G9782 Other postprocedural complications and disorders of nervous system: Secondary | ICD-10-CM | POA: Diagnosis not present

## 2023-01-28 DIAGNOSIS — G038 Meningitis due to other specified causes: Secondary | ICD-10-CM | POA: Diagnosis not present

## 2023-01-28 DIAGNOSIS — R1084 Generalized abdominal pain: Secondary | ICD-10-CM | POA: Diagnosis not present

## 2023-01-28 DIAGNOSIS — G9340 Encephalopathy, unspecified: Secondary | ICD-10-CM | POA: Diagnosis not present

## 2023-01-28 LAB — CULTURE, BLOOD (ROUTINE X 2)
Culture: NO GROWTH
Culture: NO GROWTH
Special Requests: ADEQUATE

## 2023-01-31 DIAGNOSIS — G038 Meningitis due to other specified causes: Secondary | ICD-10-CM | POA: Diagnosis not present

## 2023-01-31 DIAGNOSIS — G9782 Other postprocedural complications and disorders of nervous system: Secondary | ICD-10-CM | POA: Diagnosis not present

## 2023-01-31 DIAGNOSIS — G9600 Cerebrospinal fluid leak, unspecified: Secondary | ICD-10-CM | POA: Diagnosis not present

## 2023-02-08 ENCOUNTER — Encounter (INDEPENDENT_AMBULATORY_CARE_PROVIDER_SITE_OTHER): Payer: Medicare Other | Admitting: Ophthalmology

## 2023-02-24 DIAGNOSIS — M5031 Other cervical disc degeneration,  high cervical region: Secondary | ICD-10-CM | POA: Diagnosis not present

## 2023-02-24 DIAGNOSIS — G9782 Other postprocedural complications and disorders of nervous system: Secondary | ICD-10-CM | POA: Diagnosis not present

## 2023-02-24 DIAGNOSIS — Z981 Arthrodesis status: Secondary | ICD-10-CM | POA: Diagnosis not present

## 2023-02-24 DIAGNOSIS — G9600 Cerebrospinal fluid leak, unspecified: Secondary | ICD-10-CM | POA: Diagnosis not present

## 2023-03-18 ENCOUNTER — Telehealth: Payer: Self-pay | Admitting: *Deleted

## 2023-03-18 NOTE — Telephone Encounter (Signed)
 PC to patient, informed him his MRI cervical spine has been authorized by his insurance & may be scheduled at any time before his appointment with Dr Barbaraann Cao on 04/06/23.  Central Scheduling phone number given, patient to schedule.

## 2023-03-24 DIAGNOSIS — H35432 Paving stone degeneration of retina, left eye: Secondary | ICD-10-CM | POA: Diagnosis not present

## 2023-03-24 DIAGNOSIS — H43811 Vitreous degeneration, right eye: Secondary | ICD-10-CM | POA: Diagnosis not present

## 2023-03-24 DIAGNOSIS — H2513 Age-related nuclear cataract, bilateral: Secondary | ICD-10-CM | POA: Diagnosis not present

## 2023-03-24 DIAGNOSIS — H353131 Nonexudative age-related macular degeneration, bilateral, early dry stage: Secondary | ICD-10-CM | POA: Diagnosis not present

## 2023-04-01 ENCOUNTER — Ambulatory Visit (HOSPITAL_COMMUNITY)
Admission: RE | Admit: 2023-04-01 | Discharge: 2023-04-01 | Disposition: A | Discharge status: Home / Self Care | Payer: Medicare Other | Source: Ambulatory Visit | Attending: Internal Medicine | Admitting: Internal Medicine

## 2023-04-01 ENCOUNTER — Ambulatory Visit (HOSPITAL_COMMUNITY)
Admission: RE | Admit: 2023-04-01 | Discharge: 2023-04-01 | Disposition: A | Discharge status: Home / Self Care | Payer: Medicare Other | Source: Ambulatory Visit | Attending: Nurse Practitioner | Admitting: Nurse Practitioner

## 2023-04-01 ENCOUNTER — Other Ambulatory Visit (HOSPITAL_COMMUNITY): Payer: Self-pay | Admitting: Nurse Practitioner

## 2023-04-01 DIAGNOSIS — D447 Neoplasm of uncertain behavior of aortic body and other paraganglia: Secondary | ICD-10-CM | POA: Diagnosis not present

## 2023-04-01 DIAGNOSIS — Z4789 Encounter for other orthopedic aftercare: Secondary | ICD-10-CM | POA: Diagnosis not present

## 2023-04-01 DIAGNOSIS — G9782 Other postprocedural complications and disorders of nervous system: Secondary | ICD-10-CM

## 2023-04-01 DIAGNOSIS — G9600 Cerebrospinal fluid leak, unspecified: Secondary | ICD-10-CM | POA: Diagnosis not present

## 2023-04-01 DIAGNOSIS — Z981 Arthrodesis status: Secondary | ICD-10-CM | POA: Diagnosis not present

## 2023-04-01 DIAGNOSIS — G9589 Other specified diseases of spinal cord: Secondary | ICD-10-CM | POA: Diagnosis not present

## 2023-04-01 MED ORDER — GADOBUTROL 1 MMOL/ML IV SOLN
8.0000 mL | Freq: Once | INTRAVENOUS | Status: AC | PRN
  Administered 2023-04-01: 8 mL via INTRAVENOUS

## 2023-04-06 ENCOUNTER — Inpatient Hospital Stay: Payer: Medicare Other | Attending: Internal Medicine | Admitting: Internal Medicine

## 2023-04-06 VITALS — BP 164/94 | HR 65 | Temp 98.6°F | Resp 13 | Wt 194.2 lb

## 2023-04-06 DIAGNOSIS — Z8546 Personal history of malignant neoplasm of prostate: Secondary | ICD-10-CM | POA: Diagnosis not present

## 2023-04-06 DIAGNOSIS — Z9079 Acquired absence of other genital organ(s): Secondary | ICD-10-CM | POA: Insufficient documentation

## 2023-04-06 DIAGNOSIS — Z9221 Personal history of antineoplastic chemotherapy: Secondary | ICD-10-CM | POA: Diagnosis not present

## 2023-04-06 DIAGNOSIS — Z85528 Personal history of other malignant neoplasm of kidney: Secondary | ICD-10-CM | POA: Insufficient documentation

## 2023-04-06 DIAGNOSIS — Z87891 Personal history of nicotine dependence: Secondary | ICD-10-CM | POA: Insufficient documentation

## 2023-04-06 DIAGNOSIS — D447 Neoplasm of uncertain behavior of aortic body and other paraganglia: Secondary | ICD-10-CM

## 2023-04-06 DIAGNOSIS — Z905 Acquired absence of kidney: Secondary | ICD-10-CM | POA: Insufficient documentation

## 2023-04-06 NOTE — Progress Notes (Signed)
[facility] [address]  [place], [place] [address] [phone]   Interval Evaluation  Date of Service: 04/06/23 Patient Name: [name] Patient MRN: [id number] Patient DOB: 02/27/1946 Provider: Henreitta Leber, MD  Identifying Statement:  [name] is a 78 y.o. male with  C7   paraganglioma  who presents for initial consultation and evaluation.    Referring Provider: Irena Reichmann, DO [address] [place],  [place] [address]  Oncologic History: Oncology History  Primary malignant neoplasm of kidney with metastasis from kidney to other site [facility])  01/13/2019 Initial Diagnosis   Primary malignant neoplasm of kidney with metastasis from kidney to other site [facility])   01/26/2019 - 08/23/2019 Chemotherapy   Patient is on Treatment Plan : HEAD/NECK Pembrolizumab Q21D     09/30/2022 Genetic Testing   Negative genetic testing on the CancerNext-Expanded+RNAinsight panel.  The report date is 09/30/2022.  The CancerNext-Expanded gene panel offered by [facility] and includes sequencing and rearrangement analysis for the following 77 genes: AIP, ALK, APC*, ATM*, AXIN2, BAP1, BARD1, BMPR1A, BRCA1*, BRCA2*, BRIP1*, CDC73, CDH1*, CDK4, CDKN1B, CDKN2A, CHEK2*, CTNNA1, DICER1, FH, FLCN, KIF1B, LZTR1, MAX, MEN1, MET, MLH1*, MSH2*, MSH3, MSH6*, MUTYH*, NF1*, NF2, NTHL1, PALB2*, PHOX2B, PMS2*, POT1, PRKAR1A, PTCH1, PTEN*, RAD51C*, RAD51D*, RB1, RET, SDHA, SDHAF2, SDHB, SDHC, SDHD, SMAD4, SMARCA4, SMARCB1, SMARCE1, STK11, SUFU, TMEM127, TP53*, TSC1, TSC2, and VHL (sequencing and deletion/duplication); EGFR, EGLN1, HOXB13, KIT, MITF, PDGFRA, POLD1, and POLE (sequencing only); EPCAM and GREM1 (deletion/duplication only). DNA and RNA analyses performed for * genes.     CNS Oncologic History 08/26/22: Laminectomy, resection of right C7 root mass (Nundkumar); path c/w paraganglioma 01/30/23: Repair of CSF leak at [facility], followed by reactive  meninigitis, ICU course    Interval History: [name] presents today for follow up after recent surgeries and ICU admission at [facility].  He feels significantly improved with regards to his neck and back pain.  He is hardly needing any pain medication.  Remains with numbness in the little fingers of his right hand, as prior.  Does have some headaches, with pain in the back of his head if turned in a certain way.  Overall neck mobility remains very decreased, he is looking forward to initiating with physical therapy.  H+P (09/29/22) Patient presents for follow up after resection of cervical paraganglioma with Dr. Conchita Paris.  He describes several months of severe pain in his right shoulder and arm, as well as weakness in his right hand.  Following 2 unsuccessful laminectomies, he underwent contrast enhanced MRI study which demonstrated dumbell shaped mass along nerve root at C7 on the right.  He underwent resection of this on 08/26/22; following surgery he had dramatic improvement in both pain and weakness.  Presently he has post-surgical discomfort only.  No neurologic complaints.      Medications: Current Outpatient Medications on File Prior to Visit  Medication Sig Dispense Refill   acetaZOLAMIDE (DIAMOX) 250 MG tablet Take 250 mg by mouth every 12 (twelve) hours.     aspirin EC 81 MG tablet Take 81 mg by mouth daily. Swallow whole. (Patient not taking: Reported on 01/23/2023)     busPIRone (BUSPAR) 5 MG tablet Take 5 mg by mouth 2 (two) times daily as needed (for anxiety).     Carboxymethylcellulose Sodium (THERATEARS OP) Place 1 drop into both eyes 3 (three) times daily as needed (for dryness).     docusate sodium (COLACE) 100 MG capsule  Take 100-200 mg by mouth daily as needed for mild constipation.     fluticasone (FLONASE) 50 MCG/ACT nasal spray Place 1 spray into both nostrils daily as needed for allergies.      HYDROmorphone (DILAUDID) 2 MG tablet Take 2 mg by mouth every 4 (four) hours as  needed (for pain- wean as directed).     losartan-hydrochlorothiazide (HYZAAR) 100-25 MG tablet Take 1 tablet by mouth daily.     methocarbamol (ROBAXIN) 750 MG tablet Take 750 mg by mouth 3 (three) times daily as needed for muscle spasms.     ondansetron (ZOFRAN-ODT) 4 MG disintegrating tablet Take 4 mg by mouth every 8 (eight) hours as needed for nausea or vomiting (dissolve orally).     PEPCID AC MAXIMUM STRENGTH 20 MG tablet Take 20 mg by mouth daily as needed for heartburn or indigestion.     pravastatin (PRAVACHOL) 40 MG tablet Take 40 mg by mouth daily.     pregabalin (LYRICA) 75 MG capsule Take 75 mg by mouth 2 (two) times daily.     TYLENOL 500 MG tablet Take 1,000 mg by mouth every 6 (six) hours as needed for mild pain (pain score 1-3) or headache.     No current facility-administered medications on file prior to visit.    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   Polycythemia    phlebotomy 06/15/13   Prostate cancer (HCC) dx'd 2008   seed implant   Wears glasses    Wears hearing aid    both ears   Past Surgical History:  Past Surgical History:  Procedure Laterality Date   COLONOSCOPY     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   Procedure: EXCISION LEFT DUPUYTRENS RING AND SMALL FINGERS;  Surgeon: Wyn Forster., MD;  Location: [facility];  Service: Orthopedics;  Laterality: Left;    ESOPHAGOGASTRODUODENOSCOPY (EGD) WITH PROPOFOL N/A 05/12/2018   Procedure: ESOPHAGOGASTRODUODENOSCOPY (EGD) WITH PROPOFOL;  Surgeon: Carman Ching, MD;  Location: [facility];  Service: Endoscopy;  Laterality: N/A;   HERNIA REPAIR Left 2010   lt ing with lt hydrocele- inguinal   IR IMAGING GUIDED PORT INSERTION  01/20/2019   IR REMOVAL TUN ACCESS W/ PORT W/O FL MOD SED  11/21/2019   ORCHIECTOMY Right 10/29/2018   Procedure: ORCHIECTOMY radical;  Surgeon: Sebastian Ache, MD;  Location: [facility];  Service: Urology;  Laterality: Right;  75 MINS   POSTERIOR CERVICAL FUSION/FORAMINOTOMY Right 08/26/2022   Procedure: CERVICAL SEVEN-THORACIC ONE LAMINOTOMY AND FACETECTOMY FOR RESECTION OF INTRADURAL TUMOR WITH FUSION;  Surgeon: [name], MD;  Location: [facility];  Service: Neurosurgery;  Laterality: Right;  3C   RADIOACTIVE SEED IMPLANT  2006   hx of prostate   ROBOT ASSISTED LAPAROSCOPIC NEPHRECTOMY Right 06/22/2018   Procedure: XI ROBOTIC ASSISTED LAPAROSCOPIC NEPHRECTOMY;  Surgeon: Sebastian Ache, MD;  Location: [facility];  Service: Urology;  Laterality: Right;  3 HRS   SHOULDER ARTHROSCOPY  01/2012   right   Social History:  Social History   Socioeconomic History   Marital status: Married    Spouse name: Not on file   Number of children: 1   Years of education: Not on file   Highest education level: Not on file  Occupational History   Not on file  Tobacco Use   Smoking status: Former    Current packs/day: 0.00    Types: Cigarettes    Quit date: 06/15/1980    Years since quitting: 42.8   Smokeless tobacco: Never  Vaping Use   Vaping status: Never Used  Substance and Sexual Activity   Alcohol use: Never   Drug use: No   Sexual activity: Not on file  Other Topics Concern   Not on file  Social History Narrative   Not on file   Social Drivers of Health   Financial Resource Strain: Low Risk  (02/03/2023)   [facility]   Overall Financial  Resource Strain (CARDIA)    Difficulty of Paying Living Expenses: Not hard at all  Food Insecurity: No Food Insecurity (02/03/2023)   Received from [facility]   Hunger Vital Sign    Worried About Running Out of Food in the Last Year: Never true    Ran Out of Food in the Last Year: Never true  Transportation Needs: No Transportation Needs (02/03/2023)   Received from [facility]    In the past 12 months, has lack of transportation kept you from medical appointments or from getting medications?: No    Lack of Transportation (Non-Medical): No  Physical Activity: Not on file  Stress: Not on file  Social Connections: Not on file  Intimate Partner Violence: Not At Risk (08/26/2022)   Humiliation, Afraid, Rape, and Kick questionnaire    Fear of Current or Ex-Partner: No    Emotionally Abused: No    Physically Abused: No    Sexually Abused: No   Family History:  Family History  Problem Relation Age of Onset   Heart attack Father    Hypertension Father    Pneumonia Sister    Stroke Brother    Leukemia Maternal Aunt    Stroke Maternal Aunt    Prostate cancer Maternal Uncle 72   Bladder Cancer Paternal Uncle 61   Heart attack Maternal Grandfather    Cancer Paternal Grandfather        NOS   Healthy Son     Review of Systems: Constitutional: Doesn't report fevers, chills or abnormal weight loss Eyes: Doesn't report blurriness of vision Ears, nose, mouth, throat, and face: Doesn't report sore throat Respiratory: Doesn't report cough, dyspnea or wheezes Cardiovascular: Doesn't report palpitation, chest discomfort  Gastrointestinal:  Doesn't report nausea, constipation, diarrhea GU: Doesn't report incontinence Skin: Doesn't report skin rashes Neurological: Per HPI Musculoskeletal: Doesn't report joint pain Behavioral/Psych: Doesn't report anxiety  Physical Exam: Vitals:   04/06/23 0936  BP: (!) 164/94  Pulse: 65   Resp: 13  Temp: 98.6 F (37 C)  SpO2: 99%   KPS: 90. General: Alert, cooperative, pleasant, in no acute distress Head: Normal EENT: No conjunctival injection or scleral icterus.  Lungs: Resp effort normal Cardiac: Regular rate Abdomen: Non-distended abdomen Skin: No rashes cyanosis or petechiae. Extremities: No clubbing or edema  Neurologic Exam: Mental Status: Awake, alert, attentive to examiner. Oriented to self  and environment. Language is fluent with intact comprehension.  Cranial Nerves: Visual acuity is grossly normal. Visual fields are full. Extra-ocular movements intact. No ptosis. Face is symmetric Motor: Tone and bulk are normal. Power is full in both arms and legs. Reflexes are symmetric, no pathologic reflexes present.  Sensory: Intact to light touch Gait: Normal.   Labs: I have reviewed the data as listed    Component Value Date/Time   NA 141 01/23/2023 0639   NA 141 01/18/2015 0835   K 3.7 01/23/2023 0639   K 4.1 01/18/2015 0835   CL 107 01/23/2023 0639   CO2 23 01/23/2023 0632   CO2 25 01/18/2015 0835   GLUCOSE 129 (H) 01/23/2023 0639   GLUCOSE 111 01/18/2015 0835   BUN 22 01/23/2023 0639   BUN 21.5 01/18/2015 0835   CREATININE 1.20 01/23/2023 0639   CREATININE 1.29 (H) 11/30/2022 0731   CREATININE 1.1 01/18/2015 0835   CALCIUM 10.2 01/23/2023 0632   CALCIUM 9.8 01/18/2015 0835   PROT 7.4 01/23/2023 0632   PROT 7.1 01/18/2015 0835   ALBUMIN 3.5 01/23/2023 0632   ALBUMIN 3.8 01/18/2015 0835   AST 20 01/23/2023 0632   AST 12 (L) 11/30/2022 0731   AST 15 01/18/2015 0835   ALT 13 01/23/2023 0632   ALT 10 11/30/2022 0731   ALT 16 01/18/2015 0835   ALKPHOS 67 01/23/2023 0632   ALKPHOS 66 01/18/2015 0835   BILITOT 0.7 01/23/2023 0632   BILITOT 0.8 11/30/2022 0731   BILITOT 0.91 01/18/2015 0835   GFRNONAA >60 01/23/2023 0632   GFRNONAA 57 (L) 11/30/2022 0731   GFRAA 50 (L) 11/21/2019 0832   GFRAA 56 (L) 10/16/2019 0819   Lab Results  Component  Value Date   WBC 7.8 01/23/2023   NEUTROABS 5.9 01/23/2023   HGB 16.0 01/23/2023   HCT 47.0 01/23/2023   MCV 85.5 01/23/2023   PLT 312 01/23/2023    Imaging:  MR CERVICAL SPINE W WO CONTRAST Result Date: 04/01/2023 CLINICAL DATA:  78 year old male with a history of cervical spine surgery. Resection of intradural tumor in 2024, reportedly paraganglioma. History of clear cell renal cell carcinoma, in remission by imaging on restaging CT in September. EXAM: MRI CERVICAL SPINE WITHOUT AND WITH CONTRAST TECHNIQUE: Multiplanar and multiecho pulse sequences of the cervical spine, to include the craniocervical junction and cervicothoracic junction, were obtained without and with intravenous contrast. CONTRAST:  8mL GADAVIST GADOBUTROL 1 MMOL/ML IV SOLN COMPARISON:  Cervical spine radiographs today. Postoperative total spine MRI 01/23/2023 and earlier. FINDINGS: Alignment: Stable and relatively normal cervical lordosis. Vertebrae: Anterior and posterior hardware susceptibility artifact C3 through the cervicothoracic junction. Background bone marrow signal within normal limits. No marrow edema or evidence of acute osseous abnormality. No abnormal enhancement identified. Cord: Cervical spinal cord morphology appears stable since November. Diffuse leptomeningeal enhancement has regressed, largely resolved. No cord edema or cord expansion identified. Subtle T2 hyperintensity within the central cord now at the C6-C7 level (series 8, image 30) suggests faint myelomalacia. No other cervical cord signal abnormality identified. Grossly negative visible upper thoracic spinal cord. No pachymeningeal thickening identified. Posterior Fossa, vertebral arteries, paraspinal tissues: Cervicomedullary junction is within normal limits. Negative visible posterior fossa. Preserved major vascular flow voids in the bilateral neck. Substantially regressed and largely resolved posterior paraspinal fluid collection. A small residual  (estimated 3-4 mL) overlies the T1 and T2 spinous processes in the midline (series 7, image 10). Surrounding enhancing granulation tissue there. And granulation tissue elsewhere in the midline  posterior neck soft tissues. No suspicious soft tissue inflammation or enhancement. Negative visible other neck soft tissues. Negative visible lung apices. Disc levels: Since November spinal canal patency appears stable to mildly improved at all levels, and no definite spinal stenosis identified now. Enhancing granulation tissue along the right lateral epidural space and neural foramen at C7-T1, right C8 nerve level), is noted with no masslike enhancement (series 12, image 35). IMPRESSION: 1. Substantially regressed and virtually resolved posterior paraspinal fluid collection since November compatible with resolving CSF leak. Only 3-4 mL estimated residual fluid now in the midline overlying the T1 and T2 spinous processes. 2. Also regressed and virtually resolved leptomeningeal enhancement also since November. Subtle myelomalacia now suspected within the central cervical spinal cord at C6-C7. Granulation tissue, including at the site of the right C8 neural foraminal paraganglioma. No residual tumor or suspicious enhancement identified. 3. Extensive previous anterior and posterior fusion, see also radiographs today reported separately. No significant spinal stenosis identified now. Electronically Signed   By: Odessa Fleming M.D.   On: 04/01/2023 15:24   DG Cervical Spine 2 or 3 views Result Date: 04/01/2023 CLINICAL DATA:  78 year old male with a history of cervical spine surgery. Resection of intradural tumor in 2024. 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 EXAM: CERVICAL SPINE - 2-3 VIEW COMPARISON:  Postoperative cervical spine MRI 01/23/2023 and earlier. FINDINGS: PA and lateral views. Anterior C3 through C7 and posterior C3 through T1 fusion hardware. Hardware appears intact. Maintained vertebral body height. Background bone  mineralization is within normal limits. No acute osseous abnormality identified. Normal prevertebral soft tissue contour. Negative visible thoracic inlet. IMPRESSION: Anterior and posterior cervical fusion with no adverse hardware features or acute osseous abnormality identified. Electronically Signed   By: Odessa Fleming M.D.   On: 04/01/2023 15:07   Assessment/Plan Paraganglioma (HCC)  Alby Burruel Dumlao is clinically improved following CSF leak, pseudomeningocele repair in November.  This had been complicated by reactive aseptic meningitis, now recovered.  MRI of the cervical spine demonstrates post-surgical changes, with no recurrence of tumor along the C7 nerve root or elsewhere.    He will likely be able to proceed with PT soon, pending approval from his surgeon.  Headaches are likely occipital neuralgia, he could benefit from nerve block if symptoms develop further or become more intrusive.  We ask that KOALII INGS return to clinic in 12 months following next cervical spine MRI, or sooner as needed.t  All questions were answered. The patient knows to call the clinic with any problems, questions or concerns. No barriers to learning were detected.  The total time spent in the encounter was 40 minutes and more than 50% was on counseling and review of test results   Henreitta Leber, MD Medical Director of Neuro-Oncology [facility] 04/06/23 9:47 AM

## 2023-04-07 ENCOUNTER — Telehealth: Payer: Self-pay | Admitting: Internal Medicine

## 2023-04-07 ENCOUNTER — Ambulatory Visit: Payer: Medicare Other | Attending: Orthopedic Surgery

## 2023-04-07 ENCOUNTER — Other Ambulatory Visit: Payer: Self-pay

## 2023-04-07 DIAGNOSIS — M436 Torticollis: Secondary | ICD-10-CM | POA: Insufficient documentation

## 2023-04-07 DIAGNOSIS — R29898 Other symptoms and signs involving the musculoskeletal system: Secondary | ICD-10-CM | POA: Diagnosis not present

## 2023-04-07 DIAGNOSIS — Z7409 Other reduced mobility: Secondary | ICD-10-CM | POA: Diagnosis not present

## 2023-04-07 DIAGNOSIS — M5412 Radiculopathy, cervical region: Secondary | ICD-10-CM | POA: Insufficient documentation

## 2023-04-07 NOTE — Therapy (Signed)
OUTPATIENT PHYSICAL THERAPY CERVICAL EVALUATION   Patient Name: [name] MRN: [id number] DOB:07-30-1945, 78 y.o., male Today's Date: 04/07/2023  END OF SESSION:  PT End of Session - 04/07/23 1241     Visit Number 1    Date for PT Re-Evaluation 06/30/23    Progress Note Due on Visit 10    PT Start Time 1515    PT Stop Time 1600    PT Time Calculation (min) 45 min    Activity Tolerance Patient tolerated treatment well    Behavior During Therapy [facility] for tasks assessed/performed             Past Medical History:  Diagnosis Date   Anxiety    Arthritis    Diverticulitis    Emphysema lung (HCC)    Family history of bladder cancer    Family history of prostate cancer    Hypertension    Inguinal hernia    Metastatic renal cell carcinoma (HCC) dx'd 2020   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   Polycythemia    phlebotomy 06/15/13   Prostate cancer (HCC) dx'd 2008   seed implant   Wears glasses    Wears hearing aid    both ears   Past Surgical History:  Procedure Laterality Date   COLONOSCOPY     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   Procedure: EXCISION LEFT DUPUYTRENS RING AND SMALL FINGERS;  Surgeon: Wyn Forster., MD;  Location: [facility];  Service: Orthopedics;  Laterality: Left;   ESOPHAGOGASTRODUODENOSCOPY (EGD) WITH PROPOFOL N/A 05/12/2018   Procedure: ESOPHAGOGASTRODUODENOSCOPY (EGD) WITH PROPOFOL;  Surgeon: Carman Ching, MD;  Location: [facility];  Service: Endoscopy;  Laterality: N/A;   HERNIA REPAIR Left 2010   lt ing with lt hydrocele- inguinal   IR IMAGING GUIDED PORT INSERTION  01/20/2019   IR REMOVAL TUN ACCESS W/ PORT W/O FL MOD SED  11/21/2019   ORCHIECTOMY Right 10/29/2018   Procedure: ORCHIECTOMY radical;  Surgeon: Sebastian Ache, MD;  Location: [facility];  Service: Urology;  Laterality: Right;  75 MINS   POSTERIOR CERVICAL FUSION/FORAMINOTOMY Right 08/26/2022   Procedure: CERVICAL SEVEN-THORACIC ONE LAMINOTOMY AND  FACETECTOMY FOR RESECTION OF INTRADURAL TUMOR WITH FUSION;  Surgeon: [name], MD;  Location: [facility];  Service: Neurosurgery;  Laterality: Right;  3C   RADIOACTIVE SEED IMPLANT  2006   hx of prostate   ROBOT ASSISTED LAPAROSCOPIC NEPHRECTOMY Right 06/22/2018   Procedure: XI ROBOTIC ASSISTED LAPAROSCOPIC NEPHRECTOMY;  Surgeon: Sebastian Ache, MD;  Location: [facility];  Service: Urology;  Laterality: Right;  3 HRS   SHOULDER ARTHROSCOPY  01/2012   right   Patient Active Problem List   Diagnosis Date Noted   Genetic testing 10/01/2022   Family history of prostate cancer 09/22/2022   Family history of bladder cancer 09/22/2022   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    THERAPY DIAG:  Stiffness of cervical spine - Plan: PT plan of care cert/re-cert  Right arm weakness - Plan: PT plan of care cert/re-cert  Impaired functional  mobility and activity tolerance - Plan: PT plan of care cert/re-cert  Rationale for Evaluation and Treatment: Rehabilitation  ONSET DATE: 01/30/23  SUBJECTIVE:                                                                                                                                                                                                         SUBJECTIVE STATEMENT: I have been through a lot but now feel better.  Want to be able to drive and play golf again. I have lost so much strength over the last year from being down Hand dominance: Right  PERTINENT HISTORY:  Malignant  neoplasm kidney, with metastasis, 08/26/22 had resection of C7 root due to paraganglioma, then developed CSF leak, underwent exploratory clean out of surgical site, now referred to PT for assistance in recovery of funciton  PAIN:  Are you having pain? Yes: NPRS scale: 0 to 3 Pain location: post c spine with terminal neck movements Pain description: block" Aggravating factors: excessive movement Relieving factors: resting  PRECAUTIONS: Other: active cancer  RED FLAGS: None     WEIGHT BEARING RESTRICTIONS: No  FALLS:  Has patient fallen in last 6 months? Yes. 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  DIAGNOSTIC FINDINGS:  Not available on epic but pt demonstrated to therapist pictures of x rays with multilevel rods B c spine extending C3 to C 7  PATIENT SURVEYS:  NDI Neck Disability Index score: 22 / 50 = 44.0 %  COGNITION: Overall cognitive status: Within functional limits for tasks assessed  SENSATION: Light touch: diminished   POSTURE: rounded shoulders, forward head, and increased thoracic kyphosis  PALPATION: Non tender B upper traps and levator.   Assessed c spine ROM in neutral and with cervical flexion to isolate upper cervical motion.  Pt more restricted with upper cervical movement   CERVICAL ROM:   Active ROM A/PROM (deg) eval  Flexion 50  Extension 10  Right lateral flexion 0  Left lateral flexion 0  Right rotation 15  Left rotation 10   (Blank rows = not tested)  UPPER EXTREMITY ROM:B shoulder /UE Rom wnl   UPPER EXTREMITY MMT:  MMT Right eval Left eval  Shoulder flexion  Shoulder extension    Shoulder abduction    Shoulder adduction    Shoulder extension    Shoulder internal rotation    Shoulder external rotation    Middle trapezius    Lower trapezius    Elbow flexion 4   Elbow extension 4-   Wrist flexion    Wrist extension    Wrist ulnar deviation    Wrist radial deviation    Wrist pronation    Wrist supination    Grip strength     (Blank rows = not tested)  CERVICAL SPECIAL TESTS:  na  FUNCTIONAL TESTS:  TBD  TREATMENT DATE: 04/06/22:  instructed in the following:                                                                                                                             Access Code: [email or web address] URL: [email or web address] Date: 04/07/2023 Prepared by: Chrishonda Hesch  Exercises - Seated Isometric Cervical Rotation  - 1 x daily - 7 x weekly - 3 sets - 10 reps - Shoulder External Rotation and Scapular Retraction with Resistance  - 1 x daily - 7 x weekly - 3 sets - 10 reps - Seated Shoulder Horizontal Abduction with Resistance  - 1 x daily - 7 x weekly - 3 sets - 10 reps   PATIENT EDUCATION:  Education details: POC, goals Person educated: Patient Education method: Explanation, Demonstration, Tactile cues, Verbal cues, and Handouts Education comprehension: verbalized understanding, returned demonstration, verbal cues required, tactile cues required, and needs further education  HOME EXERCISE PROGRAM: See above  ASSESSMENT:  CLINICAL IMPRESSION: Patient is a 78 y.o. male who was evaluated today by physical therapy due to cervical spine weakness, stiffness, and UE weakness after several surgeries cervical spine due to C7 paraganglioma.  At this time his pain is managed well . He is now 8 weeks post his last surgery and has been referred and cleared by his surgeon to initiate Rom, strengthening, endurance to his tolerance.  He has a very stiff cervical spine, which is his greatest problem in regard to his function,  also residual weakness and numbness R UE along C7 myotome, as well as overall loss of endurance and bulk muscle mass.  He should benefit from skilled PT to address his deficits and his goals.  He is very motivated.  He has started walking 1 mile when weather permits to start building his activity tolerance.  OBJECTIVE IMPAIRMENTS: decreased activity tolerance, decreased endurance, decreased ROM, decreased strength, hypomobility, impaired UE functional use, postural dysfunction, and pain.   ACTIVITY LIMITATIONS: carrying, lifting, standing, sleeping, and reach over head  PARTICIPATION LIMITATIONS: meal prep, cleaning, laundry, driving, and community activity  PERSONAL FACTORS: Age, Behavior pattern, Past/current experiences, Time since onset of injury/illness/exacerbation, and 1-2 comorbidities: Mag neoplasm kidney, and 5 surgeries due to C7 paraganglioma  are also affecting patient's functional outcome.   REHAB POTENTIAL: Good  CLINICAL DECISION MAKING: Evolving/moderate complexity  EVALUATION COMPLEXITY:  Moderate   GOALS: Goals reviewed with patient? Yes  SHORT TERM GOALS: Target date: 2 weeks, 05/19/23  I HEP Baseline: initiated at eval Goal status: INITIAL  LONG TERM GOALS: Target date: 06/30/23:  Improve  Neck Disability Index score: 22 / 50 = 44.0 %  to 30% Baseline:  Goal status: INITIAL  2.  Improve cervical spine ROM for rotation from 15 % to 40 %or greater B Baseline:  Goal status: INITIAL  3.  Improve strength R triceps from 4-/5 to 4+/5 Baseline:  Goal status: INITIAL  4.  Assess 30 sec sit to stand for a functional strength and endurance test, then set goal Baseline:  Goal status: INITIAL     PLAN:  PT FREQUENCY: 2x/week  PT DURATION: 12 weeks  PLANNED INTERVENTIONS: 97110-Therapeutic exercises, 97530- Therapeutic activity, 97112- Neuromuscular re-education, 97535- Self Care, and 34742- Manual therapy  PLAN FOR NEXT SESSION: progress with additional  strengthening B Ue's ,  postural musculature, and LE's, assess 30 sec sit to stand   Schylar Allard L Evangelynn Lochridge, PT, DPT, OCS 04/07/2023, 5:29 PM

## 2023-04-07 NOTE — Telephone Encounter (Signed)
 Marland Kitchen

## 2023-04-12 NOTE — Therapy (Signed)
OUTPATIENT PHYSICAL THERAPY CERVICAL TREATMENT   Patient Name: [name] MRN: [id number] DOB:February 05, 1946, 78 y.o., male Today's Date: 04/13/2023  END OF SESSION:  PT End of Session - 04/13/23 0928     Visit Number 2    Date for PT Re-Evaluation 06/30/23    Progress Note Due on Visit 10    PT Start Time 0929    PT Stop Time 1015    PT Time Calculation (min) 46 min    Activity Tolerance Patient tolerated treatment well    Behavior During Therapy [facility] for tasks assessed/performed              Past Medical History:  Diagnosis Date   Anxiety    Arthritis    Diverticulitis    Emphysema lung (HCC)    Family history of bladder cancer    Family history of prostate cancer    Hypertension    Inguinal hernia    Metastatic renal cell carcinoma (HCC) dx'd 2020   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   Polycythemia    phlebotomy 06/15/13   Prostate cancer (HCC) dx'd 2008   seed implant   Wears glasses    Wears hearing aid    both ears   Past Surgical History:  Procedure Laterality Date   COLONOSCOPY     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   Procedure: EXCISION LEFT DUPUYTRENS RING AND SMALL FINGERS;  Surgeon: Wyn Forster., MD;  Location: [facility];  Service: Orthopedics;  Laterality: Left;   ESOPHAGOGASTRODUODENOSCOPY (EGD) WITH PROPOFOL N/A 05/12/2018   Procedure: ESOPHAGOGASTRODUODENOSCOPY (EGD) WITH PROPOFOL;  Surgeon: Carman Ching, MD;  [facility];  Service: Endoscopy;  Laterality: N/A;   HERNIA REPAIR Left 2010   lt ing with lt hydrocele- inguinal   IR IMAGING GUIDED PORT INSERTION  01/20/2019   IR REMOVAL TUN ACCESS W/ PORT W/O FL MOD SED  11/21/2019   ORCHIECTOMY Right 10/29/2018   Procedure: ORCHIECTOMY radical;  Surgeon: Sebastian Ache, MD;  Location: [facility];  Service: Urology;  Laterality: Right;  75 MINS   POSTERIOR CERVICAL FUSION/FORAMINOTOMY Right 08/26/2022   Procedure: CERVICAL SEVEN-THORACIC ONE LAMINOTOMY AND  FACETECTOMY FOR RESECTION OF INTRADURAL TUMOR WITH FUSION;  Surgeon: [name], MD;  Location[facility];  Service: Neurosurgery;  Laterality: Right;  3C   RADIOACTIVE SEED IMPLANT  2006   hx of prostate   ROBOT ASSISTED LAPAROSCOPIC NEPHRECTOMY Right 06/22/2018   Procedure: XI ROBOTIC ASSISTED LAPAROSCOPIC NEPHRECTOMY;  Surgeon: Sebastian Ache, MD;  Location: [facility];  Service: Urology;  Laterality: Right;  3 HRS   SHOULDER ARTHROSCOPY  01/2012   right   Patient Active Problem List   Diagnosis Date Noted   Genetic testing 10/01/2022   Family history of prostate cancer 09/22/2022   Family history of bladder cancer 09/22/2022   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    THERAPY DIAG:  Stiffness of cervical spine  Right arm weakness  Impaired functional mobility and activity tolerance  Radiculopathy, cervical region  Rationale for Evaluation and  Treatment: Rehabilitation  ONSET DATE: 01/30/23  SUBJECTIVE:                                                                                                                                                                                                         SUBJECTIVE STATEMENT: I am doing okay, I don't have much motion in my neck but I want to have a whole lot more. Hand dominance: Right  PERTINENT HISTORY:  Malignant neoplasm kidney, with metastasis, 08/26/22 had resection of C7 root due to paraganglioma, then developed CSF leak, underwent exploratory clean out  of surgical site, now referred to PT for assistance in recovery of funciton  PAIN:  Are you having pain? Yes: NPRS scale: 0 to 3 Pain location: post c spine with terminal neck movements Pain description: block" Aggravating factors: excessive movement Relieving factors: resting  PRECAUTIONS: Other: active cancer  RED FLAGS: None     WEIGHT BEARING RESTRICTIONS: No  FALLS:  Has patient fallen in last 6 months? Yes. 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  DIAGNOSTIC FINDINGS:  Not available on epic but pt demonstrated to therapist pictures of x rays with multilevel rods B c spine extending C3 to C 7  PATIENT SURVEYS:  NDI Neck Disability Index score: 22 / 50 = 44.0 %  COGNITION: Overall cognitive status: Within functional limits for tasks assessed  SENSATION: Light touch: diminished   POSTURE: rounded shoulders, forward head, and increased thoracic kyphosis  PALPATION: Non tender B upper traps and levator.   Assessed c spine ROM in neutral and with cervical flexion to isolate upper cervical motion.  Pt more restricted with upper cervical movement   CERVICAL ROM:   Active ROM A/PROM (deg) eval  Flexion 50  Extension 10  Right lateral flexion 0  Left lateral flexion 0  Right rotation 15  Left rotation 10   (Blank rows = not tested)  UPPER EXTREMITY ROM:B shoulder /UE Rom wnl   UPPER EXTREMITY MMT:  MMT Right eval Left eval  Shoulder flexion    Shoulder extension    Shoulder abduction    Shoulder adduction    Shoulder extension    Shoulder internal rotation    Shoulder  external rotation     Middle trapezius    Lower trapezius    Elbow flexion 4   Elbow extension 4-   Wrist flexion    Wrist extension    Wrist ulnar deviation    Wrist radial deviation    Wrist pronation    Wrist supination    Grip strength     (Blank rows = not tested)  CERVICAL SPECIAL TESTS:  na  FUNCTIONAL TESTS:  TBD  TREATMENT DATE:  04/13/23 UBE L2 x3 mins forwards and back  Standing rows and extension red 2x10  Scapular retraction ER with red band 2x10 Horizontal abd red 2x10 AAROM c-spine with towel x10 flexion and rotation STM and manual stretches for neck  UT stretch 30s x2 both sides  Bicep curls 20# 2x10 Tricep ext 35# 2x10   04/06/22:  instructed in the following:                                                                                                                             Access Code: [email or web address] URL: [email or web address] Date: 04/07/2023 Prepared by: Amy Speaks  Exercises - Seated Isometric Cervical Rotation  - 1 x daily - 7 x weekly - 3 sets - 10 reps - Shoulder External Rotation and Scapular Retraction with Resistance  - 1 x daily - 7 x weekly - 3 sets - 10 reps - Seated Shoulder Horizontal Abduction with Resistance  - 1 x daily - 7 x weekly - 3 sets - 10 reps   PATIENT EDUCATION:  Education details: POC, goals Person educated: Patient Education method: Explanation, Demonstration, Tactile cues, Verbal cues, and Handouts Education comprehension: verbalized understanding, returned demonstration, verbal cues required, tactile cues required, and needs further education  HOME EXERCISE PROGRAM: Access Code: [email or web address] URL: [email or web address] Date: 04/13/2023 Prepared by: Cassie Freer  Exercises - Seated Isometric Cervical Rotation  - 2 x daily - 7 x weekly - 3 sets - 10 reps - Shoulder External Rotation and Scapular Retraction with Resistance  - 2 x daily - 7 x weekly - 3 sets - 10 reps - Seated Shoulder Horizontal Abduction with  Resistance  - 2 x daily - 7 x weekly - 3 sets - 10 reps - Seated Assisted Cervical Rotation with Towel  - 2 x daily - 7 x weekly - 3 sets - 10 reps - Cervical Extension AROM with Strap  - 2 x daily - 7 x weekly - 3 sets - 10 reps - Seated Cervical Sidebending Stretch  - 2 x daily - 7 x weekly - 2 reps - 30 hold  ASSESSMENT:  CLINICAL IMPRESSION: Patient is a 78 y.o. male was seen today by physical therapy due to cervical spine weakness, stiffness, and UE weakness after several surgeries cervical spine due to C7 paraganglioma.  He has a very stiff cervical spine, which is his greatest problem  in regards to his function. He also has residual weakness and loss of endurance.  I added in new exercises as part of HEP to work more on c-spine mobility and stretching to increase ROM. Session was mostly focused range of motion and c-spine mobility, in addition to some light strengthening. He states that he feels like he got more motion at end of visit. Pt will benefit from skilled PT to address his deficits to be able to return to playing golf.   OBJECTIVE IMPAIRMENTS: decreased activity tolerance, decreased endurance, decreased ROM, decreased strength, hypomobility, impaired UE functional use, postural dysfunction, and pain.   ACTIVITY LIMITATIONS: carrying, lifting, standing, sleeping, and reach over head  PARTICIPATION LIMITATIONS: meal prep, cleaning, laundry, driving, and community activity  PERSONAL FACTORS: Age, Behavior pattern, Past/current experiences, Time since onset of injury/illness/exacerbation, and 1-2 comorbidities: Mag neoplasm kidney, and 5 surgeries due to C7 paraganglioma  are also affecting patient's functional outcome.   REHAB POTENTIAL: Good  CLINICAL DECISION MAKING: Evolving/moderate complexity  EVALUATION COMPLEXITY: Moderate   GOALS: Goals reviewed with patient? Yes  SHORT TERM GOALS: Target date: 2 weeks, 05/19/23  I HEP Baseline: initiated at eval Goal status:  INITIAL  LONG TERM GOALS: Target date: 06/30/23:  Improve  Neck Disability Index score: 22 / 50 = 44.0 %  to 30% Baseline:  Goal status: INITIAL  2.  Improve cervical spine ROM for rotation from 15 % to 40 %or greater B Baseline:  Goal status: INITIAL  3.  Improve strength R triceps from 4-/5 to 4+/5 Baseline:  Goal status: INITIAL  4.  Assess 30 sec sit to stand for a functional strength and endurance test, then set goal Baseline:  Goal status: INITIAL     PLAN:  PT FREQUENCY: 2x/week  PT DURATION: 12 weeks  PLANNED INTERVENTIONS: 97110-Therapeutic exercises, 97530- Therapeutic activity, 97112- Neuromuscular re-education, 97535- Self Care, and 16109- Manual therapy  PLAN FOR NEXT SESSION: progress with additional strengthening B Ue's ,  postural musculature, and LE's, assess 30 sec sit to stand   Cassie Freer, PT, DPT 04/13/2023, 10:16 AM

## 2023-04-13 ENCOUNTER — Ambulatory Visit: Payer: Medicare Other

## 2023-04-13 DIAGNOSIS — M5412 Radiculopathy, cervical region: Secondary | ICD-10-CM | POA: Diagnosis not present

## 2023-04-13 DIAGNOSIS — R29898 Other symptoms and signs involving the musculoskeletal system: Secondary | ICD-10-CM

## 2023-04-13 DIAGNOSIS — Z7409 Other reduced mobility: Secondary | ICD-10-CM

## 2023-04-13 DIAGNOSIS — M436 Torticollis: Secondary | ICD-10-CM

## 2023-04-15 ENCOUNTER — Ambulatory Visit: Payer: Medicare Other

## 2023-04-15 DIAGNOSIS — R29898 Other symptoms and signs involving the musculoskeletal system: Secondary | ICD-10-CM

## 2023-04-15 DIAGNOSIS — Z7409 Other reduced mobility: Secondary | ICD-10-CM | POA: Diagnosis not present

## 2023-04-15 DIAGNOSIS — M5412 Radiculopathy, cervical region: Secondary | ICD-10-CM | POA: Diagnosis not present

## 2023-04-15 DIAGNOSIS — M436 Torticollis: Secondary | ICD-10-CM | POA: Diagnosis not present

## 2023-04-15 NOTE — Therapy (Signed)
OUTPATIENT PHYSICAL THERAPY CERVICAL TREATMENT   Patient Name: [name] MRN: [id number] DOB:Jun 20, 1945, 78 y.o., male Today's Date: 04/15/2023  END OF SESSION:  PT End of Session - 04/15/23 1543     Visit Number 3    Date for PT Re-Evaluation 06/30/23    Progress Note Due on Visit 10    PT Start Time 1545    PT Stop Time 1630    PT Time Calculation (min) 45 min    Activity Tolerance Patient tolerated treatment well    Behavior During Therapy [facility] for tasks assessed/performed               Past Medical History:  Diagnosis Date   Anxiety    Arthritis    Diverticulitis    Emphysema lung (HCC)    Family history of bladder cancer    Family history of prostate cancer    Hypertension    Inguinal hernia    Metastatic renal cell carcinoma (HCC) dx'd 2020   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   Polycythemia    phlebotomy 06/15/13   Prostate cancer (HCC) dx'd 2008   seed implant   Wears glasses    Wears hearing aid    both ears   Past Surgical History:  Procedure Laterality Date   COLONOSCOPY     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   Procedure: EXCISION LEFT DUPUYTRENS RING AND SMALL FINGERS;  Surgeon: Wyn Forster., MD;  Location: [facility];  Service: Orthopedics;  Laterality: Left;   ESOPHAGOGASTRODUODENOSCOPY (EGD) WITH PROPOFOL N/A 05/12/2018   Procedure: ESOPHAGOGASTRODUODENOSCOPY (EGD) WITH PROPOFOL;  Surgeon: Carman Ching, MD;  Location: [facility];  Service: Endoscopy;  Laterality: N/A;   HERNIA REPAIR Left 2010   lt ing with lt hydrocele- inguinal   IR IMAGING GUIDED PORT INSERTION  01/20/2019   IR REMOVAL TUN ACCESS W/ PORT W/O FL MOD SED  11/21/2019   ORCHIECTOMY Right 10/29/2018   Procedure: ORCHIECTOMY radical;  Surgeon: Sebastian Ache, MD;  Location: [facility];  Service: Urology;  Laterality: Right;  75 MINS   POSTERIOR CERVICAL FUSION/FORAMINOTOMY Right 08/26/2022   Procedure: CERVICAL SEVEN-THORACIC ONE LAMINOTOMY AND  FACETECTOMY FOR RESECTION OF INTRADURAL TUMOR WITH FUSION;  Surgeon: [name], MD;  Location: [facility];  Service: Neurosurgery;  Laterality: Right;  3C   RADIOACTIVE SEED IMPLANT  2006   hx of prostate   ROBOT ASSISTED LAPAROSCOPIC NEPHRECTOMY Right 06/22/2018   Procedure: XI ROBOTIC ASSISTED LAPAROSCOPIC NEPHRECTOMY;  Surgeon: Sebastian Ache, MD;  Location: [facility];  Service: Urology;  Laterality: Right;  3 HRS   SHOULDER ARTHROSCOPY  01/2012   right   Patient Active Problem List   Diagnosis Date Noted   Genetic testing 10/01/2022   Family history of prostate cancer 09/22/2022   Family history of bladder cancer 09/22/2022   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    THERAPY DIAG:  Stiffness of cervical spine  Right arm weakness  Impaired functional mobility and activity tolerance  Radiculopathy, cervical region  Rationale for Evaluation  and Treatment: Rehabilitation  ONSET DATE: 01/30/23  SUBJECTIVE:                                                                                                                                                                                                         SUBJECTIVE STATEMENT: Just moving along. Was a little sore after last time but nothing bad. Doing the new exercises at home.  Hand dominance: Right  PERTINENT HISTORY:  Malignant neoplasm kidney, with metastasis, 08/26/22 had resection of C7 root due to paraganglioma, then developed CSF leak, underwent  exploratory clean out of surgical site, now referred to PT for assistance in recovery of funciton  PAIN:  Are you having pain? Yes: NPRS scale: 0 to 3 Pain location: post c spine with terminal neck movements Pain description: block" Aggravating factors: excessive movement Relieving factors: resting  PRECAUTIONS: Other: active cancer  RED FLAGS: None     WEIGHT BEARING RESTRICTIONS: No  FALLS:  Has patient fallen in last 6 months? Yes. 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  DIAGNOSTIC FINDINGS:  Not available on epic but pt demonstrated to therapist pictures of x rays with multilevel rods B c spine extending C3 to C 7  PATIENT SURVEYS:  NDI Neck Disability Index score: 22 / 50 = 44.0 %  COGNITION: Overall cognitive status: Within functional limits for tasks assessed  SENSATION: Light touch: diminished   POSTURE: rounded shoulders, forward head, and increased thoracic kyphosis  PALPATION: Non tender B upper traps and levator.   Assessed c spine ROM in neutral and with cervical flexion to isolate upper cervical motion.  Pt more restricted with upper cervical movement   CERVICAL ROM:   Active ROM A/PROM (deg) eval  Flexion 50  Extension 10  Right lateral flexion 0  Left lateral flexion 0  Right rotation 15  Left rotation 10   (Blank rows = not tested)  UPPER EXTREMITY ROM:B shoulder /UE Rom wnl   UPPER EXTREMITY MMT:  MMT Right eval Left eval  Shoulder flexion    Shoulder extension    Shoulder abduction    Shoulder adduction    Shoulder extension    Shoulder internal rotation  Shoulder external rotation    Middle trapezius    Lower trapezius    Elbow flexion 4   Elbow extension 4-   Wrist flexion    Wrist extension    Wrist ulnar deviation    Wrist radial deviation    Wrist pronation    Wrist supination    Grip strength     (Blank rows = not tested)  CERVICAL SPECIAL TESTS:  na  FUNCTIONAL TESTS:  TBD  TREATMENT DATE:  04/15/23 UBE L3 x10mins each way  30s chair test 10 reps Seated row 20# 2x10 Lat pull down 20# x10, 25# x10 Chin tucks against band green 2x10  Supine manual therapy to c-spine, stretching  Some use of theragun to upper trap followed by stretching   04/13/23 UBE L2 x3 mins forwards and back  Standing rows and extension red 2x10  Scapular retraction ER with red band 2x10 Horizontal abd red 2x10 AAROM c-spine with towel x10 flexion and rotation STM and manual stretches for neck  UT stretch 30s x2 both sides  Bicep curls 20# 2x10 Tricep ext 35# 2x10   04/06/22:  instructed in the following:                                                                                                                             Access Code: [email or web address] URL: [email or web address] Date: 04/07/2023 Prepared by: Amy Speaks  Exercises - Seated Isometric Cervical Rotation  - 1 x daily - 7 x weekly - 3 sets - 10 reps - Shoulder External Rotation and Scapular Retraction with Resistance  - 1 x daily - 7 x weekly - 3 sets - 10 reps - Seated Shoulder Horizontal Abduction with Resistance  - 1 x daily - 7 x weekly - 3 sets - 10 reps   PATIENT EDUCATION:  Education details: POC, goals Person educated: Patient Education method: Explanation, Demonstration, Tactile cues, Verbal cues, and Handouts Education comprehension: verbalized understanding, returned demonstration, verbal cues required, tactile cues required, and needs further education  HOME EXERCISE PROGRAM: Access Code: [email or web address] URL: [email or web address] Date:  04/13/2023 Prepared by: Cassie Freer  Exercises - Seated Isometric Cervical Rotation  - 2 x daily - 7 x weekly - 3 sets - 10 reps - Shoulder External Rotation and Scapular Retraction with Resistance  - 2 x daily - 7 x weekly - 3 sets - 10 reps - Seated Shoulder Horizontal Abduction with Resistance  - 2 x daily - 7 x weekly - 3 sets - 10 reps - Seated Assisted Cervical Rotation with Towel  - 2 x daily - 7 x weekly - 3 sets - 10 reps - Cervical Extension AROM with Strap  - 2 x daily - 7 x weekly - 3 sets - 10 reps - Seated Cervical Sidebending Stretch  - 2 x daily - 7 x weekly - 2 reps - 30 hold  ASSESSMENT:  CLINICAL IMPRESSION: Patient is a 78 y.o. male was seen today  by physical therapy due to cervical spine weakness, stiffness, and UE weakness after several surgeries cervical spine due to C7 paraganglioma.  He has a very stiff cervical spine, which is his greatest problem  in regards to his function. Session continued to focus on increasing cervical ROM and some light strengthening for posterior head and neck musculature. Cues needed with chin tucks for form.  He has made some small progress with cervical ROM. Had some pain with supine stretching and mobilizations especially with cervical extension. Pt will benefit from skilled PT to address his deficits to be able to return to playing golf.    OBJECTIVE IMPAIRMENTS: decreased activity tolerance, decreased endurance, decreased ROM, decreased strength, hypomobility, impaired UE functional use, postural dysfunction, and pain.   ACTIVITY LIMITATIONS: carrying, lifting, standing, sleeping, and reach over head  PARTICIPATION LIMITATIONS: meal prep, cleaning, laundry, driving, and community activity  PERSONAL FACTORS: Age, Behavior pattern, Past/current experiences, Time since onset of injury/illness/exacerbation, and 1-2 comorbidities: Mag neoplasm kidney, and 5 surgeries due to C7 paraganglioma  are also affecting patient's functional outcome.    REHAB POTENTIAL: Good  CLINICAL DECISION MAKING: Evolving/moderate complexity  EVALUATION COMPLEXITY: Moderate   GOALS: Goals reviewed with patient? Yes  SHORT TERM GOALS: Target date: 2 weeks, 05/19/23  I HEP Baseline: initiated at eval Goal status: INITIAL  LONG TERM GOALS: Target date: 06/30/23:  Improve  Neck Disability Index score: 22 / 50 = 44.0 %  to 30% Baseline:  Goal status: INITIAL  2.  Improve cervical spine ROM for rotation from 15 % to 40 %or greater B Baseline:  Goal status: INITIAL  3.  Improve strength R triceps from 4-/5 to 4+/5 Baseline:  Goal status: INITIAL  4.  Assess 30 sec sit to stand for a functional strength and endurance test, 12 reps Baseline: 10-1/30/25 Goal status: INITIAL     PLAN:  PT FREQUENCY: 2x/week  PT DURATION: 12 weeks  PLANNED INTERVENTIONS: 97110-Therapeutic exercises, 97530- Therapeutic activity, 97112- Neuromuscular re-education, 97535- Self Care, and 14782- Manual therapy  PLAN FOR NEXT SESSION: progress with additional strengthening B Ue's ,  postural musculature, and LE's, assess 30 sec sit to stand   Cassie Freer, PT, DPT 04/15/2023, 4:29 PM

## 2023-04-19 ENCOUNTER — Other Ambulatory Visit: Payer: Self-pay

## 2023-04-19 ENCOUNTER — Ambulatory Visit: Payer: Medicare Other | Attending: Orthopedic Surgery

## 2023-04-19 DIAGNOSIS — M5412 Radiculopathy, cervical region: Secondary | ICD-10-CM

## 2023-04-19 DIAGNOSIS — M436 Torticollis: Secondary | ICD-10-CM | POA: Diagnosis not present

## 2023-04-19 DIAGNOSIS — Z7409 Other reduced mobility: Secondary | ICD-10-CM

## 2023-04-19 DIAGNOSIS — R29898 Other symptoms and signs involving the musculoskeletal system: Secondary | ICD-10-CM | POA: Diagnosis not present

## 2023-04-19 NOTE — Therapy (Signed)
OUTPATIENT PHYSICAL THERAPY CERVICAL TREATMENT   Patient Name: [name] MRN: [id number] DOB:10/23/1945, 78 y.o., male Today's Date: 04/19/2023  END OF SESSION:  PT End of Session - 04/19/23 1301     Visit Number 4    Date for PT Re-Evaluation 06/30/23    Progress Note Due on Visit 10    PT Start Time 1300    PT Stop Time 1345    PT Time Calculation (min) 45 min    Activity Tolerance Patient tolerated treatment well    Behavior During Therapy [facility] for tasks assessed/performed                Past Medical History:  Diagnosis Date   Anxiety    Arthritis    Diverticulitis    Emphysema lung (HCC)    Family history of bladder cancer    Family history of prostate cancer    Hypertension    Inguinal hernia    Metastatic renal cell carcinoma (HCC) dx'd 2020   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   Polycythemia    phlebotomy 06/15/13   Prostate cancer (HCC) dx'd 2008   seed implant   Wears glasses    Wears hearing aid    both ears   Past Surgical History:  Procedure Laterality Date   COLONOSCOPY     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   Procedure: EXCISION LEFT DUPUYTRENS RING AND SMALL FINGERS;  Surgeon: Wyn Forster., MD;  Location: [facility];  Service: Orthopedics;  Laterality: Left;   ESOPHAGOGASTRODUODENOSCOPY (EGD) WITH PROPOFOL N/A 05/12/2018   Procedure: ESOPHAGOGASTRODUODENOSCOPY (EGD) WITH PROPOFOL;  Surgeon: Carman Ching, MD;  [facility];  Service: Endoscopy;  Laterality: N/A;   HERNIA REPAIR Left 2010   lt ing with lt hydrocele- inguinal   IR IMAGING GUIDED PORT INSERTION  01/20/2019   IR REMOVAL TUN ACCESS W/ PORT W/O FL MOD SED  11/21/2019   ORCHIECTOMY Right 10/29/2018   Procedure: ORCHIECTOMY radical;  Surgeon: Sebastian Ache, MD;  Location: [facility];  Service: Urology;  Laterality: Right;  75 MINS   POSTERIOR CERVICAL FUSION/FORAMINOTOMY Right 08/26/2022   Procedure: CERVICAL SEVEN-THORACIC ONE LAMINOTOMY AND  FACETECTOMY FOR RESECTION OF INTRADURAL TUMOR WITH FUSION;  Surgeon: [name], MD;  Location: [facility];  Service: Neurosurgery;  Laterality: Right;  3C   RADIOACTIVE SEED IMPLANT  2006   hx of prostate   ROBOT ASSISTED LAPAROSCOPIC NEPHRECTOMY Right 06/22/2018   Procedure: XI ROBOTIC ASSISTED LAPAROSCOPIC NEPHRECTOMY;  Surgeon: Sebastian Ache, MD;  Location: [facility];  Service: Urology;  Laterality: Right;  3 HRS   SHOULDER ARTHROSCOPY  01/2012   right   Patient Active Problem List   Diagnosis Date Noted   Genetic testing 10/01/2022   Family history of prostate cancer 09/22/2022   Family history of bladder cancer 09/22/2022   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    THERAPY DIAG:  Stiffness of cervical spine  Right arm weakness  Impaired functional mobility and activity tolerance  Radiculopathy, cervical region  Rationale for  Evaluation and Treatment: Rehabilitation  ONSET DATE: 01/30/23  SUBJECTIVE:                                                                                                                                                                                                         SUBJECTIVE STATEMENT: Not changing a lot yet with my flexibility with my neck, it still really hurts and stops me when I try to turn. Hand dominance: Right  PERTINENT HISTORY:  Malignant neoplasm kidney, with metastasis, 08/26/22 had resection of C7 root due to paraganglioma, then developed CSF leak, underwent  exploratory clean out of surgical site, now referred to PT for assistance in recovery of funciton  PAIN:  Are you having pain? Yes: NPRS scale: 0 to 3 Pain location: post c spine with terminal neck movements Pain description: block" Aggravating factors: excessive movement Relieving factors: resting  PRECAUTIONS: Other: active cancer  RED FLAGS: None     WEIGHT BEARING RESTRICTIONS: No  FALLS:  Has patient fallen in last 6 months? Yes. 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  DIAGNOSTIC FINDINGS:  Not available on epic but pt demonstrated to therapist pictures of x rays with multilevel rods B c spine extending C3 to C 7  PATIENT SURVEYS:  NDI Neck Disability Index score: 22 / 50 = 44.0 %  COGNITION: Overall cognitive status: Within functional limits for tasks assessed  SENSATION: Light touch: diminished   POSTURE: rounded shoulders, forward head, and increased thoracic kyphosis  PALPATION: Non tender B upper traps and levator.   Assessed c spine ROM in neutral and with cervical flexion to isolate upper cervical motion.  Pt more restricted with upper cervical movement   CERVICAL ROM:   Active ROM A/PROM (deg) eval  Flexion 50  Extension 10  Right lateral flexion 0  Left lateral flexion 0  Right rotation 15  Left rotation 10   (Blank rows = not tested)  UPPER EXTREMITY ROM:B shoulder /UE Rom wnl   UPPER EXTREMITY MMT:  MMT Right eval Left eval  Shoulder flexion    Shoulder extension    Shoulder abduction    Shoulder adduction    Shoulder extension    Shoulder internal rotation  Shoulder external rotation    Middle trapezius    Lower trapezius    Elbow flexion 4   Elbow extension 4-   Wrist flexion    Wrist extension    Wrist ulnar deviation    Wrist radial deviation    Wrist pronation    Wrist supination    Grip strength     (Blank rows = not tested)  CERVICAL SPECIAL TESTS:  na  FUNCTIONAL TESTS:  TBD  TREATMENT DATE:  04/19/23:  UBE x 2 min F, 2 min B Instructed in therex to improve mid and lower thoracic spine mobility:  Side lying  spine open books, progressing from no wt to 13 and then 2 # cuff wts, 2 sets 15 reps each Supine, with head supported, with spine on 1/2 foam roller, placed below scar site, for pec flys, alt shoulder flex Standing, leaning over physioball, 65 cm on treatment table, for ex to engage middle traps, inst to perform unilaterally , inst in horizontal shoulder abd with thumb to ceiling, and in scapular rows with ER , he had better muscular contraction and was able to avoid using upper traps better with the rows with ER  At end of session used finger ladder to stretch each shoulder into flexion for an elongation of ribs and t spine.  04/15/23 UBE L3 x32mins each way  30s chair test 10 reps Seated row 20# 2x10 Lat pull down 20# x10, 25# x10 Chin tucks against band green 2x10  Supine manual therapy to c-spine, stretching  Some use of theragun to upper trap followed by stretching   04/13/23 UBE L2 x3 mins forwards and back  Standing rows and extension red 2x10  Scapular retraction ER with red band 2x10 Horizontal abd red 2x10 AAROM c-spine with towel x10 flexion and rotation STM and manual stretches for neck  UT stretch 30s x2 both sides  Bicep curls 20# 2x10 Tricep ext 35# 2x10   04/06/22:  instructed in the following:                                                                                                                             Access Code: [email or web address] URL: [email or web address] Date:  04/07/2023 Prepared by: Rukiya Hodgkins  Exercises - Seated Isometric Cervical Rotation  - 1 x daily - 7 x weekly - 3 sets - 10 reps - Shoulder External Rotation and Scapular Retraction with Resistance  - 1 x daily - 7 x weekly - 3 sets - 10 reps - Seated Shoulder Horizontal Abduction with Resistance  - 1 x daily - 7 x weekly - 3 sets - 10 reps   PATIENT EDUCATION:  Education details: POC, goals Person educated: Patient Education method: Explanation, Demonstration, Tactile cues, Verbal cues, and Handouts Education comprehension: verbalized understanding, returned demonstration, verbal cues required, tactile cues required, and needs further education  HOME EXERCISE PROGRAM: Access Code: [email or web address] URL: [email or web address] Date: 04/19/2023 Prepared by: Caralee Ates  Exercises -  Sit to Stand with Counter Support  - 1 x daily - 7 x weekly - 1 sets - 10 reps - Seated Hip Abduction with Resistance  - 1 x daily - 3 x weekly - 1 sets - 10 reps Access Code: [email or web address] URL: [email or web address] Date: 04/13/2023 Prepared by: Cassie Freer  Exercises - Seated Isometric Cervical Rotation  - 2 x daily - 7 x weekly - 3 sets - 10 reps - Shoulder External Rotation and Scapular Retraction with Resistance  - 2 x daily - 7 x weekly - 3 sets - 10 reps - Seated Shoulder Horizontal Abduction with Resistance  - 2 x daily - 7 x weekly - 3 sets - 10 reps - Seated Assisted Cervical Rotation with Towel  - 2 x daily - 7 x weekly - 3 sets - 10 reps - Cervical Extension AROM with Strap  - 2 x daily - 7 x weekly - 3 sets - 10 reps - Seated Cervical Sidebending Stretch  - 2 x daily - 7 x weekly - 2 reps - 30 hold  ASSESSMENT:  CLINICAL IMPRESSION: Patient is a 78 y.o. male was seen today by physical therapy due to cervical spine weakness, stiffness, and UE weakness after several surgeries cervical spine due to C7 paraganglioma.  Today we targeted his thoracic spine and ribs specifically with  stretching and with postural strengthening.  He responded very well and had some gains in his cervical spine ROM for ext and rotation at end of session.  Explained that the middle traps strengthening should typically be performed daily to address endurance and motor control components of this musculature.  Pt will benefit from skilled PT to address his deficits to be able to return to playing golf.    OBJECTIVE IMPAIRMENTS: decreased activity tolerance, decreased endurance, decreased ROM, decreased strength, hypomobility, impaired UE functional use, postural dysfunction, and pain.   ACTIVITY LIMITATIONS: carrying, lifting, standing, sleeping, and reach over head  PARTICIPATION LIMITATIONS: meal prep, cleaning, laundry, driving, and community activity  PERSONAL FACTORS: Age, Behavior pattern, Past/current experiences, Time since onset of injury/illness/exacerbation, and 1-2 comorbidities: Mag neoplasm kidney, and 5 surgeries due to C7 paraganglioma  are also affecting patient's functional outcome.   REHAB POTENTIAL: Good  CLINICAL DECISION MAKING: Evolving/moderate complexity  EVALUATION COMPLEXITY: Moderate   GOALS: Goals reviewed with patient? Yes  SHORT TERM GOALS: Target date: 2 weeks, 05/19/23  I HEP Baseline: initiated at eval Goal status: INITIAL  LONG TERM GOALS: Target date: 06/30/23:  Improve  Neck Disability Index score: 22 / 50 = 44.0 %  to 30% Baseline:  Goal status: INITIAL  2.  Improve cervical spine ROM for rotation from 15 % to 40 %or greater B Baseline:  Goal status: INITIAL  3.  Improve strength R triceps from 4-/5 to 4+/5 Baseline:  Goal status: INITIAL  4.  Assess 30 sec sit to stand for a functional strength and endurance test, 12 reps Baseline: 10-1/30/25 Goal status: INITIAL     PLAN:  PT FREQUENCY: 2x/week  PT DURATION: 12 weeks  PLANNED INTERVENTIONS: 97110-Therapeutic exercises, 97530- Therapeutic activity, 97112- Neuromuscular  re-education, 97535- Self Care, and 54098- Manual therapy  PLAN FOR NEXT SESSION: progress with additional strengthening B Ue's ,  postural musculature, and LE's, assess 30 sec sit to stand   Cyncere Sontag L Jaz [place], PT, DPT, OCS 04/19/2023, 5:36 PM

## 2023-04-21 NOTE — Therapy (Signed)
 OUTPATIENT PHYSICAL THERAPY CERVICAL TREATMENT   Patient Name: [name] MRN: [id number] DOB:12-Aug-1945, 78 y.o., male Today's Date: 04/22/2023  END OF SESSION:  PT End of Session - 04/22/23 0928     Visit Number 5    Date for PT Re-Evaluation 06/30/23    Progress Note Due on Visit 10    PT Start Time 0930    PT Stop Time 1015    PT Time Calculation (min) 45 min    Activity Tolerance Patient tolerated treatment well    Behavior During Therapy [facility] for tasks assessed/performed                 Past Medical History:  Diagnosis Date   Anxiety    Arthritis    Diverticulitis    Emphysema lung (HCC)    Family history of bladder cancer    Family history of prostate cancer    Hypertension    Inguinal hernia    Metastatic renal cell carcinoma (HCC) dx'd 2020   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   Polycythemia    phlebotomy 06/15/13   Prostate cancer (HCC) dx'd 2008   seed implant   Wears glasses    Wears hearing aid    both ears   Past Surgical History:  Procedure Laterality Date   COLONOSCOPY     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   Procedure: EXCISION LEFT DUPUYTRENS RING AND SMALL FINGERS;  Surgeon: Lamar LULLA Leonor Mickey., MD;  Location: [facility];  Service: Orthopedics;  Laterality: Left;   ESOPHAGOGASTRODUODENOSCOPY (EGD) WITH PROPOFOL  N/A 05/12/2018   Procedure: ESOPHAGOGASTRODUODENOSCOPY (EGD) WITH PROPOFOL ;  Surgeon: Celestia Agent, MD;  Location: [facility];  Service: Endoscopy;  Laterality: N/A;   HERNIA REPAIR Left 2010   lt ing with lt hydrocele- inguinal   IR IMAGING GUIDED PORT INSERTION  01/20/2019   IR REMOVAL TUN ACCESS W/ PORT W/O FL MOD SED  11/21/2019   ORCHIECTOMY Right 10/29/2018   Procedure: ORCHIECTOMY radical;  Surgeon: Alvaro Hummer, MD;  Location: [facility];  Service: Urology;  Laterality: Right;  75 MINS   POSTERIOR CERVICAL FUSION/FORAMINOTOMY Right 08/26/2022   Procedure: CERVICAL SEVEN-THORACIC ONE LAMINOTOMY  AND FACETECTOMY FOR RESECTION OF INTRADURAL TUMOR WITH FUSION;  Surgeon: Lanis Pupa, MD;  Location: [facility];  Service: Neurosurgery;  Laterality: Right;  3C   RADIOACTIVE SEED IMPLANT  2006   hx of prostate   ROBOT ASSISTED LAPAROSCOPIC NEPHRECTOMY Right 06/22/2018   Procedure: XI ROBOTIC ASSISTED LAPAROSCOPIC NEPHRECTOMY;  Surgeon: Alvaro Hummer, MD;  Location: [facility];  Service: Urology;  Laterality: Right;  3 HRS   SHOULDER ARTHROSCOPY  01/2012   right   Patient Active Problem List   Diagnosis Date Noted   Genetic testing 10/01/2022   Family history of prostate cancer 09/22/2022   Family history of bladder cancer 09/22/2022   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    THERAPY DIAG:  Stiffness of cervical spine  Right arm weakness  Impaired functional mobility and activity tolerance  Radiculopathy, cervical region  Rationale  for Evaluation and Treatment: Rehabilitation  ONSET DATE: 01/30/23  SUBJECTIVE:                                                                                                                                                                                                         SUBJECTIVE STATEMENT: About the same, no worse.  Hand dominance: Right  PERTINENT HISTORY:  Malignant neoplasm kidney, with metastasis, 08/26/22 had resection of C7 root due to paraganglioma, then developed CSF leak, underwent exploratory clean out of surgical site, now referred to PT for assistance in  recovery of funciton  PAIN:  Are you having pain? Yes: NPRS scale: 0 to 3 Pain location: post c spine with terminal neck movements Pain description: block Aggravating factors: excessive movement Relieving factors: resting  PRECAUTIONS: Other: active cancer  RED FLAGS: None     WEIGHT BEARING RESTRICTIONS: No  FALLS:  Has patient fallen in last 6 months? Yes. 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  DIAGNOSTIC FINDINGS:  Not available on epic but pt demonstrated to therapist pictures of x rays with multilevel rods B c spine extending C3 to C 7  PATIENT SURVEYS:  NDI Neck Disability Index score: 22 / 50 = 44.0 %  COGNITION: Overall cognitive status: Within functional limits for tasks assessed  SENSATION: Light touch: diminished   POSTURE: rounded shoulders, forward head, and increased thoracic kyphosis  PALPATION: Non tender B upper traps and levator.   Assessed c spine ROM in neutral and with cervical flexion to isolate upper cervical motion.  Pt more restricted with upper cervical movement   CERVICAL ROM:   Active ROM A/PROM (deg) eval  Flexion 50  Extension 10  Right lateral flexion 0  Left lateral flexion 0  Right rotation 15  Left rotation 10   (Blank rows = not tested)  UPPER EXTREMITY ROM:B shoulder /UE Rom wnl   UPPER EXTREMITY MMT:  MMT Right eval Left eval  Shoulder flexion    Shoulder extension    Shoulder abduction    Shoulder adduction    Shoulder extension    Shoulder internal rotation    Shoulder external rotation    Middle trapezius    Lower trapezius  Elbow  flexion 4   Elbow extension 4-   Wrist flexion    Wrist extension    Wrist ulnar deviation    Wrist radial deviation    Wrist pronation    Wrist supination    Grip strength     (Blank rows = not tested)  CERVICAL SPECIAL TESTS:  na  FUNCTIONAL TESTS:  TBD  TREATMENT DATE:  04/22/23 UBE L2 x85mins  W backs against wall with gentle OP x10 w/5s hold  Wall angels x10 Seated thoracic extensions x10 with OP to get further  Seated AAROM cervical flexion with strap, then rotations 2x10  Passive rotation and stretching for UT and levator  Red band horizontal abd 2x10 Red band scapular retraction 2x10 Scapular lift off from wall x10  04/19/23:  UBE x 2 min F, 2 min B Instructed in therex to improve mid and lower thoracic spine mobility:  Side lying  spine open books, progressing from no wt to 13 and then 2 # cuff wts, 2 sets 15 reps each Supine, with head supported, with spine on 1/2 foam roller, placed below scar site, for pec flys, alt shoulder flex Standing, leaning over physioball, 65 cm on treatment table, for ex to engage middle traps, inst to perform unilaterally , inst in horizontal shoulder abd with thumb to ceiling, and in scapular rows with ER , he had better muscular contraction and was able to avoid using upper traps better with the rows with ER  At end of session used finger ladder to stretch each shoulder into flexion for an elongation of ribs and t spine.  04/15/23 UBE L3 x14mins each way  30s chair test 10 reps Seated row 20# 2x10 Lat pull down 20# x10, 25# x10 Chin tucks against band green 2x10  Supine manual therapy to c-spine, stretching  Some use of theragun to upper trap followed by stretching   04/13/23 UBE L2 x3 mins forwards and back  Standing rows and extension red 2x10  Scapular retraction ER with red band 2x10 Horizontal abd red 2x10 AAROM c-spine with towel x10 flexion and rotation STM and manual stretches for neck  UT stretch 30s x2 both sides  Bicep  curls 20# 2x10 Tricep ext 35# 2x10   04/06/22:  instructed in the following:                                                                                                                             Access Code: [email or web address] URL: [email or web address] Date: 04/07/2023 Prepared by: Amy Speaks  Exercises - Seated Isometric Cervical Rotation  - 1 x daily - 7 x weekly - 3 sets - 10 reps - Shoulder External Rotation and Scapular Retraction with Resistance  - 1 x daily - 7 x weekly - 3 sets - 10 reps - Seated Shoulder Horizontal Abduction with Resistance  - 1 x daily - 7 x weekly - 3 sets - 10 reps   PATIENT EDUCATION:  Education details: POC, goals Person educated: Patient Education method: Explanation, Demonstration, Tactile cues, Verbal cues, and Handouts Education comprehension: verbalized understanding, returned demonstration, verbal cues required, tactile cues required, and needs further education  HOME EXERCISE PROGRAM: Access Code: [email or web address] URL: [email or web address] Date: 04/19/2023 Prepared by: Amy Speaks  Exercises - Sit to Stand with Counter Support  - 1 x daily - 7 x weekly - 1 sets - 10 reps - Seated Hip Abduction with Resistance  - 1 x daily - 3 x weekly - 1 sets - 10 reps Access Code: [email or web address] URL: [email or web address] Date: 04/13/2023 Prepared by: Almetta Fam  Exercises - Seated Isometric Cervical Rotation  - 2 x daily - 7 x weekly - 3 sets - 10 reps - Shoulder External Rotation and Scapular Retraction with Resistance  - 2 x daily - 7 x weekly - 3 sets - 10 reps - Seated Shoulder Horizontal Abduction with Resistance  - 2 x daily - 7 x weekly - 3 sets - 10 reps - Seated Assisted Cervical Rotation with Towel  - 2 x daily - 7 x weekly - 3 sets - 10 reps - Cervical Extension AROM with Strap  - 2 x daily - 7 x weekly - 3 sets - 10 reps - Seated Cervical Sidebending Stretch  - 2 x daily - 7 x weekly - 2 reps - 30  hold  ASSESSMENT:  CLINICAL IMPRESSION: Patient is a 79 y.o. male was seen today by physical therapy due to cervical spine weakness, stiffness, and UE weakness after several surgeries cervical spine due to C7 paraganglioma.  Today we continued to focus on increasing ROM and also worked on his thoracic mobility a little bit. Both remain really stiff and tight. He responded very well and had some gains in his cervical spine ROM for extension and rotation at end of session.  Pt will benefit from skilled PT to address his deficits to be able to return to playing golf.    OBJECTIVE IMPAIRMENTS: decreased activity tolerance, decreased endurance, decreased ROM, decreased strength, hypomobility, impaired UE functional use, postural dysfunction, and pain.   ACTIVITY LIMITATIONS: carrying, lifting, standing, sleeping, and reach over head  PARTICIPATION LIMITATIONS: meal prep, cleaning, laundry, driving, and community activity  PERSONAL FACTORS: Age, Behavior pattern, Past/current experiences, Time since onset of injury/illness/exacerbation, and 1-2 comorbidities: Mag neoplasm kidney, and 5 surgeries due to C7 paraganglioma  are also affecting patient's functional outcome.   REHAB POTENTIAL: Good  CLINICAL DECISION MAKING: Evolving/moderate complexity  EVALUATION COMPLEXITY: Moderate   GOALS: Goals reviewed with patient? Yes  SHORT TERM GOALS: Target date: 2 weeks, 05/19/23  I HEP Baseline: initiated at eval Goal status: INITIAL  LONG TERM GOALS: Target date: 06/30/23:  Improve  Neck Disability Index score: 22 / 50 = 44.0 %  to 30% Baseline:  Goal status: INITIAL  2.  Improve cervical spine ROM for rotation from 15 % to 40 %or greater B Baseline:  Goal status: INITIAL  3.  Improve strength R triceps from 4-/5 to 4+/5 Baseline:  Goal status: INITIAL  4.  Assess 30 sec sit to stand for a functional strength and endurance test, 12 reps Baseline: 10-1/30/25 Goal status:  INITIAL     PLAN:  PT FREQUENCY: 2x/week  PT DURATION: 12 weeks  PLANNED INTERVENTIONS: 97110-Therapeutic exercises, 97530- Therapeutic activity, 97112- Neuromuscular re-education, 97535- Self Care, and [id number]- Manual therapy  PLAN FOR NEXT SESSION: progress with additional strengthening B Ue's ,  postural musculature, and LE's, assess 30 sec sit  to stand   Almetta Fam, PT, DPT, OCS 04/22/2023, 10:17 AM

## 2023-04-22 ENCOUNTER — Ambulatory Visit: Payer: Medicare Other

## 2023-04-22 DIAGNOSIS — M436 Torticollis: Secondary | ICD-10-CM

## 2023-04-22 DIAGNOSIS — M5412 Radiculopathy, cervical region: Secondary | ICD-10-CM

## 2023-04-22 DIAGNOSIS — Z7409 Other reduced mobility: Secondary | ICD-10-CM | POA: Diagnosis not present

## 2023-04-22 DIAGNOSIS — R29898 Other symptoms and signs involving the musculoskeletal system: Secondary | ICD-10-CM

## 2023-04-26 ENCOUNTER — Ambulatory Visit: Payer: Medicare Other

## 2023-04-26 DIAGNOSIS — R29898 Other symptoms and signs involving the musculoskeletal system: Secondary | ICD-10-CM

## 2023-04-26 DIAGNOSIS — M5412 Radiculopathy, cervical region: Secondary | ICD-10-CM | POA: Diagnosis not present

## 2023-04-26 DIAGNOSIS — M436 Torticollis: Secondary | ICD-10-CM

## 2023-04-26 DIAGNOSIS — Z7409 Other reduced mobility: Secondary | ICD-10-CM | POA: Diagnosis not present

## 2023-04-26 NOTE — Therapy (Signed)
 OUTPATIENT PHYSICAL THERAPY CERVICAL TREATMENT   Patient Name: [name] MRN: [id number] DOB:09/18/1945, 78 y.o., male Today's Date: 04/26/2023  END OF SESSION:  PT End of Session - 04/26/23 0930     Visit Number 6    Date for PT Re-Evaluation 06/30/23    Progress Note Due on Visit 10    PT Start Time 0930    PT Stop Time 1015    PT Time Calculation (min) 45 min    Activity Tolerance Patient tolerated treatment well    Behavior During Therapy [facility] for tasks assessed/performed                  Past Medical History:  Diagnosis Date   Anxiety    Arthritis    Diverticulitis    Emphysema lung (HCC)    Family history of bladder cancer    Family history of prostate cancer    Hypertension    Inguinal hernia    Metastatic renal cell carcinoma (HCC) dx'd 2020   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   Polycythemia    phlebotomy 06/15/13   Prostate cancer (HCC) dx'd 2008   seed implant   Wears glasses    Wears hearing aid    both ears   Past Surgical History:  Procedure Laterality Date   COLONOSCOPY     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   Procedure: EXCISION LEFT DUPUYTRENS RING AND SMALL FINGERS;  Surgeon: Amelie Baize., MD;  Location: [facility];  Service: Orthopedics;  Laterality: Left;   ESOPHAGOGASTRODUODENOSCOPY (EGD) WITH PROPOFOL  N/A 05/12/2018   Procedure: ESOPHAGOGASTRODUODENOSCOPY (EGD) WITH PROPOFOL ;  Surgeon: Jolinda Necessary, MD;  Location: [facility];  Service: Endoscopy;  Laterality: N/A;   HERNIA REPAIR Left 2010   lt ing with lt hydrocele- inguinal   IR IMAGING GUIDED PORT INSERTION  01/20/2019   IR REMOVAL TUN ACCESS W/ PORT W/O FL MOD SED  11/21/2019   ORCHIECTOMY Right 10/29/2018   Procedure: ORCHIECTOMY radical;  Surgeon: Osborn Blaze, MD;  Location: [facility];  Service: Urology;  Laterality: Right;  75 MINS   POSTERIOR CERVICAL FUSION/FORAMINOTOMY Right 08/26/2022   Procedure: CERVICAL SEVEN-THORACIC ONE LAMINOTOMY  AND FACETECTOMY FOR RESECTION OF INTRADURAL TUMOR WITH FUSION;  Surgeon: Augusto Blonder, MD;  Location: [facility];  Service: Neurosurgery;  Laterality: Right;  3C   RADIOACTIVE SEED IMPLANT  2006   hx of prostate   ROBOT ASSISTED LAPAROSCOPIC NEPHRECTOMY Right 06/22/2018   Procedure: XI ROBOTIC ASSISTED LAPAROSCOPIC NEPHRECTOMY;  Surgeon: Osborn Blaze, MD;  Location: [facility];  Service: Urology;  Laterality: Right;  3 HRS   SHOULDER ARTHROSCOPY  01/2012   right   Patient Active Problem List   Diagnosis Date Noted   Genetic testing 10/01/2022   Family history of prostate cancer 09/22/2022   Family history of bladder cancer 09/22/2022   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    THERAPY DIAG:  Stiffness of cervical spine  Right arm weakness  Impaired functional mobility and activity tolerance  Radiculopathy, cervical region  Rationale for Evaluation and Treatment: Rehabilitation  ONSET DATE: 01/30/23  SUBJECTIVE:                                                                                                                                                                                                         SUBJECTIVE STATEMENT: No doubt it is better when I leave but goes back to what it was not long after. This morning it is not that painful looking up and right but it is looking left.   Hand dominance: Right  PERTINENT HISTORY:  Malignant neoplasm kidney, with metastasis, 08/26/22 had resection of C7 root  due to paraganglioma, then developed CSF leak, underwent exploratory clean out of surgical site, now referred to PT for assistance in recovery of funciton  PAIN:  Are you having pain? Yes: NPRS scale: 0 to 3 Pain location: post c spine with terminal neck movements Pain description: block" Aggravating factors: excessive movement Relieving factors: resting  PRECAUTIONS: Other: active cancer  RED FLAGS: None     WEIGHT BEARING RESTRICTIONS: No  FALLS:  Has patient fallen in last 6 months? Yes. 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  DIAGNOSTIC FINDINGS:  Not available on epic but pt demonstrated to therapist pictures of x rays with multilevel rods B c spine extending C3 to C 7  PATIENT SURVEYS:  NDI Neck Disability Index score: 22 / 50 = 44.0 %  COGNITION: Overall cognitive status: Within functional limits for tasks assessed  SENSATION: Light touch: diminished   POSTURE: rounded shoulders, forward head, and increased thoracic kyphosis  PALPATION: Non tender B upper traps and levator.   Assessed c spine ROM in neutral and with cervical flexion to isolate upper cervical motion.  Pt more restricted with upper cervical movement   CERVICAL ROM:   Active ROM A/PROM (deg) eval  Flexion 50  Extension 10  Right lateral flexion 0  Left lateral flexion 0  Right rotation 15  Left rotation 10   (Blank rows = not tested)  UPPER EXTREMITY ROM:B shoulder /UE Rom wnl   UPPER EXTREMITY MMT:  MMT Right eval Left eval  Shoulder flexion    Shoulder extension    Shoulder abduction  Shoulder adduction     Shoulder extension    Shoulder internal rotation    Shoulder external rotation    Middle trapezius    Lower trapezius    Elbow flexion 4   Elbow extension 4-   Wrist flexion    Wrist extension    Wrist ulnar deviation    Wrist radial deviation    Wrist pronation    Wrist supination    Grip strength     (Blank rows = not tested)  CERVICAL SPECIAL TESTS:  na  FUNCTIONAL TESTS:  TBD  TREATMENT DATE:  04/26/23 NuStep L5 x102mins  Seated chin tucks 2x10 Shoulder flexion and ext with dowel 2x10 Prone over physioball horizontal abd and scaption 2x10 STM and passive stretching and end range holds for cervical ROM  04/22/23 UBE L2 x38mins  W backs against wall with gentle OP x10 w/5s hold  Wall angels x10 Seated thoracic extensions x10 with OP to get further  Seated AAROM cervical flexion with strap, then rotations 2x10  Passive rotation and stretching for UT and levator  Red band horizontal abd 2x10 Red band scapular retraction 2x10 Scapular lift off from wall x10  04/19/23:  UBE x 2 min F, 2 min B Instructed in therex to improve mid and lower thoracic spine mobility:  Side lying  spine open books, progressing from no wt to 13 and then 2 # cuff wts, 2 sets 15 reps each Supine, with head supported, with spine on 1/2 foam roller, placed below scar site, for pec flys, alt shoulder flex Standing, leaning over physioball, 65 cm on treatment table, for ex to engage middle traps, inst to perform unilaterally , inst in horizontal shoulder abd with thumb to ceiling, and in scapular rows with ER , he had better muscular contraction and was able to avoid using upper traps better with the rows with ER  At end of session used finger ladder to stretch each shoulder into flexion for an elongation of ribs and t spine.  04/15/23 UBE L3 x76mins each way  30s chair test 10 reps Seated row 20# 2x10 Lat pull down 20# x10, 25# x10 Chin tucks against band green 2x10  Supine manual therapy to  c-spine, stretching  Some use of theragun to upper trap followed by stretching   04/13/23 UBE L2 x3 mins forwards and back  Standing rows and extension red 2x10  Scapular retraction ER with red band 2x10 Horizontal abd red 2x10 AAROM c-spine with towel x10 flexion and rotation STM and manual stretches for neck  UT stretch 30s x2 both sides  Bicep curls 20# 2x10 Tricep ext 35# 2x10   04/06/22:  instructed in the following:                                                                                                                             Access Code: [email or web address] URL: [email or web address] Date: 04/07/2023 Prepared by: Shaaron Dar  Exercises - Seated Isometric  Cervical Rotation  - 1 x daily - 7 x weekly - 3 sets - 10 reps - Shoulder External Rotation and Scapular Retraction with Resistance  - 1 x daily - 7 x weekly - 3 sets - 10 reps - Seated Shoulder Horizontal Abduction with Resistance  - 1 x daily - 7 x weekly - 3 sets - 10 reps   PATIENT EDUCATION:  Education details: POC, goals Person educated: Patient Education method: Explanation, Demonstration, Tactile cues, Verbal cues, and Handouts Education comprehension: verbalized understanding, returned demonstration, verbal cues required, tactile cues required, and needs further education  HOME EXERCISE PROGRAM: Access Code: [email or web address] URL: [email or web address] Date: 04/19/2023 Prepared by: Amy Speaks  Exercises - Sit to Stand with Counter Support  - 1 x daily - 7 x weekly - 1 sets - 10 reps - Seated Hip Abduction with Resistance  - 1 x daily - 3 x weekly - 1 sets - 10 reps Access Code: [email or web address] URL: [email or web address] Date: 04/13/2023 Prepared by: Donavon Fudge  Exercises - Seated Isometric Cervical Rotation  - 2 x daily - 7 x weekly - 3 sets - 10 reps - Shoulder External Rotation and Scapular Retraction with Resistance  - 2 x daily - 7 x weekly - 3 sets - 10 reps - Seated  Shoulder Horizontal Abduction with Resistance  - 2 x daily - 7 x weekly - 3 sets - 10 reps - Seated Assisted Cervical Rotation with Towel  - 2 x daily - 7 x weekly - 3 sets - 10 reps - Cervical Extension AROM with Strap  - 2 x daily - 7 x weekly - 3 sets - 10 reps - Seated Cervical Sidebending Stretch  - 2 x daily - 7 x weekly - 2 reps - 30 hold  ASSESSMENT:  CLINICAL IMPRESSION: Patient is a 78 y.o. male was seen today by physical therapy due to cervical spine weakness, stiffness, and UE weakness after several surgeries cervical spine due to C7 paraganglioma.  Today we continued to focus on increasing ROM and also worked on his thoracic mobility a little bit. Both remain really stiff and tight. Does well with prone exercises on ball, demonstrates good ROM and reports he has noticed that working on mobility further down his spine has helped with his overall mobility and function. He has slight increase in left rotation at end of visit. Pt will benefit from skilled PT to address his deficits to be able to return to playing golf.    OBJECTIVE IMPAIRMENTS: decreased activity tolerance, decreased endurance, decreased ROM, decreased strength, hypomobility, impaired UE functional use, postural dysfunction, and pain.   ACTIVITY LIMITATIONS: carrying, lifting, standing, sleeping, and reach over head  PARTICIPATION LIMITATIONS: meal prep, cleaning, laundry, driving, and community activity  PERSONAL FACTORS: Age, Behavior pattern, Past/current experiences, Time since onset of injury/illness/exacerbation, and 1-2 comorbidities: Mag neoplasm kidney, and 5 surgeries due to C7 paraganglioma  are also affecting patient's functional outcome.   REHAB POTENTIAL: Good  CLINICAL DECISION MAKING: Evolving/moderate complexity  EVALUATION COMPLEXITY: Moderate   GOALS: Goals reviewed with patient? Yes  SHORT TERM GOALS: Target date: 2 weeks, 05/19/23  I HEP Baseline: initiated at eval Goal status:  INITIAL  LONG TERM GOALS: Target date: 06/30/23:  Improve  Neck Disability Index score: 22 / 50 = 44.0 %  to 30% Baseline:  Goal status: INITIAL  2.  Improve cervical spine ROM for rotation from 15 % to 40 %or greater B Baseline:  Goal status: INITIAL  3.  Improve  strength R triceps from 4-/5 to 4+/5 Baseline:  Goal status: INITIAL  4.  Assess 30 sec sit to stand for a functional strength and endurance test, 12 reps Baseline: 10-1/30/25 Goal status: INITIAL     PLAN:  PT FREQUENCY: 2x/week  PT DURATION: 12 weeks  PLANNED INTERVENTIONS: 97110-Therapeutic exercises, 97530- Therapeutic activity, 97112- Neuromuscular re-education, 97535- Self Care, and 16109- Manual therapy  PLAN FOR NEXT SESSION: progress with additional strengthening B Ue's ,  postural musculature, continue to gain as much ROM as possible    Donavon Fudge, PT, DPT 04/26/2023, 10:12 AM

## 2023-04-29 ENCOUNTER — Ambulatory Visit: Payer: Medicare Other | Admitting: Physical Therapy

## 2023-04-29 ENCOUNTER — Encounter: Payer: Self-pay | Admitting: Physical Therapy

## 2023-04-29 DIAGNOSIS — M5412 Radiculopathy, cervical region: Secondary | ICD-10-CM | POA: Diagnosis not present

## 2023-04-29 DIAGNOSIS — R29898 Other symptoms and signs involving the musculoskeletal system: Secondary | ICD-10-CM

## 2023-04-29 DIAGNOSIS — Z7409 Other reduced mobility: Secondary | ICD-10-CM | POA: Diagnosis not present

## 2023-04-29 DIAGNOSIS — M436 Torticollis: Secondary | ICD-10-CM | POA: Diagnosis not present

## 2023-04-29 NOTE — Therapy (Signed)
OUTPATIENT PHYSICAL THERAPY CERVICAL TREATMENT   Patient Name: [name] MRN: [id number] DOB:1945/09/08, 78 y.o., male Today's Date: 04/29/2023  END OF SESSION:  PT End of Session - 04/29/23 0854     Visit Number 7    Date for PT Re-Evaluation 06/30/23    Progress Note Due on Visit 10    PT Start Time 0847    PT Stop Time 0927    PT Time Calculation (min) 40 min    Activity Tolerance Patient tolerated treatment well    Behavior During Therapy [facility] for tasks assessed/performed                   Past Medical History:  Diagnosis Date   Anxiety    Arthritis    Diverticulitis    Emphysema lung (HCC)    Family history of bladder cancer    Family history of prostate cancer    Hypertension    Inguinal hernia    Metastatic renal cell carcinoma (HCC) dx'd 2020   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   Polycythemia    phlebotomy 06/15/13   Prostate cancer (HCC) dx'd 2008   seed implant   Wears glasses    Wears hearing aid    both ears   Past Surgical History:  Procedure Laterality Date   COLONOSCOPY     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   Procedure: EXCISION LEFT DUPUYTRENS RING AND SMALL FINGERS;  Surgeon: Wyn Forster., MD;  Location: [facility];  Service: Orthopedics;  Laterality: Left;   ESOPHAGOGASTRODUODENOSCOPY (EGD) WITH PROPOFOL N/A 05/12/2018   Procedure: ESOPHAGOGASTRODUODENOSCOPY (EGD) WITH PROPOFOL;  Surgeon: Carman Ching, MD;  Location: [facility];  Service: Endoscopy;  Laterality: N/A;   HERNIA REPAIR Left 2010   lt ing with lt hydrocele- inguinal   IR IMAGING GUIDED PORT INSERTION  01/20/2019   IR REMOVAL TUN ACCESS W/ PORT W/O FL MOD SED  11/21/2019   ORCHIECTOMY Right 10/29/2018   Procedure: ORCHIECTOMY radical;  Surgeon: Sebastian Ache, MD;  Location: [facility];  Service: Urology;  Laterality: Right;  75 MINS   POSTERIOR CERVICAL FUSION/FORAMINOTOMY Right 08/26/2022   Procedure: CERVICAL SEVEN-THORACIC ONE  LAMINOTOMY AND FACETECTOMY FOR RESECTION OF INTRADURAL TUMOR WITH FUSION;  Surgeon: [name], MD;  Location: [facility];  Service: Neurosurgery;  Laterality: Right;  3C   RADIOACTIVE SEED IMPLANT  2006   hx of prostate   ROBOT ASSISTED LAPAROSCOPIC NEPHRECTOMY Right 06/22/2018   Procedure: XI ROBOTIC ASSISTED LAPAROSCOPIC NEPHRECTOMY;  Surgeon: Sebastian Ache, MD;  Location: [facility];  Service: Urology;  Laterality: Right;  3 HRS   SHOULDER ARTHROSCOPY  01/2012   right   Patient Active Problem List   Diagnosis Date Noted   Genetic testing 10/01/2022   Family history of prostate cancer 09/22/2022   Family history of bladder cancer 09/22/2022   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    THERAPY DIAG:  Stiffness of cervical spine  Right arm weakness  Impaired functional mobility and activity tolerance  Radiculopathy, cervical region  Rationale for Evaluation and Treatment: Rehabilitation  ONSET DATE: 01/30/23  SUBJECTIVE:                                                                                                                                                                                                         SUBJECTIVE STATEMENT:  Everything's about the same today, neck is still really stiff. Yesterday my neck was moving better than normal, today its more stiff.   Hand dominance: Right  PERTINENT HISTORY:  Malignant neoplasm kidney, with metastasis, 08/26/22 had resection of C7 root due to  paraganglioma, then developed CSF leak, underwent exploratory clean out of surgical site, now referred to PT for assistance in recovery of funciton  PAIN:  Are you having pain? No 0/10 at rest, with movement has low levels of pain but "not too bad"   PRECAUTIONS: Other: active cancer  RED FLAGS: None     WEIGHT BEARING RESTRICTIONS: No  FALLS:  Has patient fallen in last 6 months? Yes. 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  DIAGNOSTIC FINDINGS:  Not available on epic but pt demonstrated to therapist pictures of x rays with multilevel rods B c spine extending C3 to C 7  PATIENT SURVEYS:  NDI Neck Disability Index score: 22 / 50 = 44.0 %  COGNITION: Overall cognitive status: Within functional limits for tasks assessed  SENSATION: Light touch: diminished   POSTURE: rounded shoulders, forward head, and increased thoracic kyphosis  PALPATION: Non tender B upper traps and levator.   Assessed c spine ROM in neutral and with cervical flexion to isolate upper cervical motion.  Pt more restricted with upper cervical movement   CERVICAL ROM:   Active ROM A/PROM (deg) eval  Flexion 50  Extension 10  Right lateral flexion 0  Left lateral flexion 0  Right rotation 15  Left rotation 10   (Blank rows = not tested)  UPPER EXTREMITY ROM:B shoulder /UE Rom wnl   UPPER EXTREMITY MMT:  MMT Right eval Left eval  Shoulder flexion    Shoulder extension    Shoulder abduction    Shoulder adduction    Shoulder extension    Shoulder internal rotation    Shoulder external  rotation    Middle trapezius    Lower  trapezius    Elbow flexion 4   Elbow extension 4-   Wrist flexion    Wrist extension    Wrist ulnar deviation    Wrist radial deviation    Wrist pronation    Wrist supination    Grip strength     (Blank rows = not tested)  CERVICAL SPECIAL TESTS:  na  FUNCTIONAL TESTS:  TBD  TREATMENT DATE:   04/29/23   UBE L2.5 x4 min forward/4 min backward for w/u, tissue perfusion Thoracic AROM/stretching all directions for postural mobility Backwards shoulder rolls for postural retraining/stretching   Chin tucks 10x3 second holds  Chin tucks + rotation 10x2 second holds Chin tucks + lateral flexion 10x2 second holds    Scar massage to reduce adhesions/increase mobility of scar tissue STM to suboccipitals and cervical paraspinals      04/26/23 NuStep L5 x15mins  Seated chin tucks 2x10 Shoulder flexion and ext with dowel 2x10 Prone over physioball horizontal abd and scaption 2x10 STM and passive stretching and end range holds for cervical ROM  04/22/23 UBE L2 x53mins  W backs against wall with gentle OP x10 w/5s hold  Wall angels x10 Seated thoracic extensions x10 with OP to get further  Seated AAROM cervical flexion with strap, then rotations 2x10  Passive rotation and stretching for UT and levator  Red band horizontal abd 2x10 Red band scapular retraction 2x10 Scapular lift off from wall x10  04/19/23:  UBE x 2 min F, 2 min B Instructed in therex to improve mid and lower thoracic spine mobility:  Side lying  spine open books, progressing from no wt to 13 and then 2 # cuff wts, 2 sets 15 reps each Supine, with head supported, with spine on 1/2 foam roller, placed below scar site, for pec flys, alt shoulder flex Standing, leaning over physioball, 65 cm on treatment table, for ex to engage middle traps, inst to perform unilaterally , inst in horizontal shoulder abd with thumb to ceiling, and in scapular rows with ER , he had better muscular contraction and was able to avoid  using upper traps better with the rows with ER  At end of session used finger ladder to stretch each shoulder into flexion for an elongation of ribs and t spine.  04/15/23 UBE L3 x21mins each way  30s chair test 10 reps Seated row 20# 2x10 Lat pull down 20# x10, 25# x10 Chin tucks against band green 2x10  Supine manual therapy to c-spine, stretching  Some use of theragun to upper trap followed by stretching   04/13/23 UBE L2 x3 mins forwards and back  Standing rows and extension red 2x10  Scapular retraction ER with red band 2x10 Horizontal abd red 2x10 AAROM c-spine with towel x10 flexion and rotation STM and manual stretches for neck  UT stretch 30s x2 both sides  Bicep curls 20# 2x10 Tricep ext 35# 2x10   04/06/22:  instructed in the following:  Access Code: [email or web address] URL: [email or web address] Date: 04/07/2023 Prepared by: Amy Speaks  Exercises - Seated Isometric Cervical Rotation  - 1 x daily - 7 x weekly - 3 sets - 10 reps - Shoulder External Rotation and Scapular Retraction with Resistance  - 1 x daily - 7 x weekly - 3 sets - 10 reps - Seated Shoulder Horizontal Abduction with Resistance  - 1 x daily - 7 x weekly - 3 sets - 10 reps   PATIENT EDUCATION:  Education details: POC, goals Person educated: Patient Education method: Explanation, Demonstration, Tactile cues, Verbal cues, and Handouts Education comprehension: verbalized understanding, returned demonstration, verbal cues required, tactile cues required, and needs further education  HOME EXERCISE PROGRAM: Access Code: [email or web address] URL: [email or web address] Date: 04/19/2023 Prepared by: Amy Speaks  Exercises - Sit to Stand with Counter Support  - 1 x daily - 7 x weekly - 1 sets - 10 reps - Seated Hip Abduction with Resistance  - 1 x daily - 3 x weekly - 1 sets - 10  reps Access Code: [email or web address] URL: [email or web address] Date: 04/13/2023 Prepared by: Cassie Freer  Exercises - Seated Isometric Cervical Rotation  - 2 x daily - 7 x weekly - 3 sets - 10 reps - Shoulder External Rotation and Scapular Retraction with Resistance  - 2 x daily - 7 x weekly - 3 sets - 10 reps - Seated Shoulder Horizontal Abduction with Resistance  - 2 x daily - 7 x weekly - 3 sets - 10 reps - Seated Assisted Cervical Rotation with Towel  - 2 x daily - 7 x weekly - 3 sets - 10 reps - Cervical Extension AROM with Strap  - 2 x daily - 7 x weekly - 3 sets - 10 reps - Seated Cervical Sidebending Stretch  - 2 x daily - 7 x weekly - 2 reps - 30 hold  ASSESSMENT:  CLINICAL IMPRESSION:  Pt arrives today doing OK, reported success in increasing neck motion after last visit but woke up very stiff again this morning. We warmed up on the UBE then focused on general mobility of cervico-thoracic region, also worked in a bit of scar massage at EOS. Thoracic region also very immobile and stiff.  Will continue to progress as appropriate.    OBJECTIVE IMPAIRMENTS: decreased activity tolerance, decreased endurance, decreased ROM, decreased strength, hypomobility, impaired UE functional use, postural dysfunction, and pain.   ACTIVITY LIMITATIONS: carrying, lifting, standing, sleeping, and reach over head  PARTICIPATION LIMITATIONS: meal prep, cleaning, laundry, driving, and community activity  PERSONAL FACTORS: Age, Behavior pattern, Past/current experiences, Time since onset of injury/illness/exacerbation, and 1-2 comorbidities: Mag neoplasm kidney, and 5 surgeries due to C7 paraganglioma  are also affecting patient's functional outcome.   REHAB POTENTIAL: Good  CLINICAL DECISION MAKING: Evolving/moderate complexity  EVALUATION COMPLEXITY: Moderate   GOALS: Goals reviewed with patient? Yes  SHORT TERM GOALS: Target date: 2 weeks, 05/19/23  I HEP Baseline: initiated at  eval Goal status: INITIAL  LONG TERM GOALS: Target date: 06/30/23:  Improve  Neck Disability Index score: 22 / 50 = 44.0 %  to 30% Baseline:  Goal status: INITIAL  2.  Improve cervical spine ROM for rotation from 15 % to 40 %or greater B Baseline:  Goal status: INITIAL  3.  Improve strength R triceps from 4-/5 to 4+/5 Baseline:  Goal status: INITIAL  4.  Assess 30 sec sit to stand for a functional strength and endurance test, 12 reps Baseline: 10-1/30/25 Goal status: INITIAL  PLAN:  PT FREQUENCY: 2x/week  PT DURATION: 12 weeks  PLANNED INTERVENTIONS: 97110-Therapeutic exercises, 97530- Therapeutic activity, [id number]- Neuromuscular re-education, 97535- Self Care, and 54098- Manual therapy  PLAN FOR NEXT SESSION: progress with additional strengthening B Ue's ,  postural musculature, continue to gain as much ROM as possible, manual techniques as appropriate/desired. Dropping to 1x/week per his request (he is feeling overwhelmed by multiple MD appts)  Nedra Hai, PT, DPT 04/29/23 9:27 AM

## 2023-05-06 ENCOUNTER — Ambulatory Visit: Payer: Medicare Other

## 2023-05-06 DIAGNOSIS — M5412 Radiculopathy, cervical region: Secondary | ICD-10-CM | POA: Diagnosis not present

## 2023-05-06 DIAGNOSIS — R29898 Other symptoms and signs involving the musculoskeletal system: Secondary | ICD-10-CM

## 2023-05-06 DIAGNOSIS — Z7409 Other reduced mobility: Secondary | ICD-10-CM

## 2023-05-06 DIAGNOSIS — M436 Torticollis: Secondary | ICD-10-CM

## 2023-05-06 NOTE — Therapy (Signed)
 OUTPATIENT PHYSICAL THERAPY CERVICAL TREATMENT   Patient Name: [name] MRN: [id number] DOB:August 25, 1945, 78 y.o., male Today's Date: 05/06/2023  END OF SESSION:  PT End of Session - 05/06/23 1458     Visit Number 8    Date for PT Re-Evaluation 06/30/23    Progress Note Due on Visit 10    PT Start Time 1500    PT Stop Time 1545    PT Time Calculation (min) 45 min    Activity Tolerance Patient tolerated treatment well    Behavior During Therapy [facility] for tasks assessed/performed                    Past Medical History:  Diagnosis Date   Anxiety    Arthritis    Diverticulitis    Emphysema lung (HCC)    Family history of bladder cancer    Family history of prostate cancer    Hypertension    Inguinal hernia    Metastatic renal cell carcinoma (HCC) dx'd 2020   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   Polycythemia    phlebotomy 06/15/13   Prostate cancer (HCC) dx'd 2008   seed implant   Wears glasses    Wears hearing aid    both ears   Past Surgical History:  Procedure Laterality Date   COLONOSCOPY     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   Procedure: EXCISION LEFT DUPUYTRENS RING AND SMALL FINGERS;  Surgeon: Wyn Forster., MD;  Location: [facility];  Service: Orthopedics;  Laterality: Left;   ESOPHAGOGASTRODUODENOSCOPY (EGD) WITH PROPOFOL N/A 05/12/2018   Procedure: ESOPHAGOGASTRODUODENOSCOPY (EGD) WITH PROPOFOL;  Surgeon: Carman Ching, MD;  Location: [facility];  Service: Endoscopy;  Laterality: N/A;   HERNIA REPAIR Left 2010   lt ing with lt hydrocele- inguinal   IR IMAGING GUIDED PORT INSERTION  01/20/2019   IR REMOVAL TUN ACCESS W/ PORT W/O FL MOD SED  11/21/2019   ORCHIECTOMY Right 10/29/2018   Procedure: ORCHIECTOMY radical;  Surgeon: Sebastian Ache, MD;  Location: [facility];  Service: Urology;  Laterality: Right;  75 MINS   POSTERIOR CERVICAL FUSION/FORAMINOTOMY Right 08/26/2022   Procedure: CERVICAL SEVEN-THORACIC ONE  LAMINOTOMY AND FACETECTOMY FOR RESECTION OF INTRADURAL TUMOR WITH FUSION;  Surgeon: [name], MD;  Location: [facility];  Service: Neurosurgery;  Laterality: Right;  3C   RADIOACTIVE SEED IMPLANT  2006   hx of prostate   ROBOT ASSISTED LAPAROSCOPIC NEPHRECTOMY Right 06/22/2018   Procedure: XI ROBOTIC ASSISTED LAPAROSCOPIC NEPHRECTOMY;  Surgeon: Sebastian Ache, MD;  Location: [facility];  Service: Urology;  Laterality: Right;  3 HRS   SHOULDER ARTHROSCOPY  01/2012   right   Patient Active Problem List   Diagnosis Date Noted   Genetic testing 10/01/2022   Family history of prostate cancer 09/22/2022   Family history of bladder cancer 09/22/2022   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    THERAPY DIAG:  Stiffness of cervical spine  Right arm weakness  Impaired functional mobility and activity tolerance  Radiculopathy, cervical  region  Rationale for Evaluation and Treatment: Rehabilitation  ONSET DATE: 01/30/23  SUBJECTIVE:                                                                                                                                                                                                         SUBJECTIVE STATEMENT:  About the same. I think I am getting stronger but my neck is still not moving that much.   Hand dominance: Right  PERTINENT HISTORY:  Malignant neoplasm kidney, with metastasis, 08/26/22 had resection of C7 root due to paraganglioma, then developed CSF leak, underwent  exploratory clean out of surgical site, now referred to PT for assistance in recovery of funciton  PAIN:  Are you having pain? No 0/10 at rest, with movement has low levels of pain but "not too bad"   PRECAUTIONS: Other: active cancer  RED FLAGS: None     WEIGHT BEARING RESTRICTIONS: No  FALLS:  Has patient fallen in last 6 months? Yes. 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  DIAGNOSTIC FINDINGS:  Not available on epic but pt demonstrated to therapist pictures of x rays with multilevel rods B c spine extending C3 to C 7  PATIENT SURVEYS:  NDI Neck Disability Index score: 22 / 50 = 44.0 %  COGNITION: Overall cognitive status: Within functional limits for tasks assessed  SENSATION: Light touch: diminished   POSTURE: rounded shoulders, forward head, and increased thoracic kyphosis  PALPATION: Non tender B upper traps and levator.   Assessed c spine ROM in neutral and with cervical flexion to isolate upper cervical motion.  Pt more restricted with upper cervical movement   CERVICAL ROM:   Active ROM A/PROM (deg) eval  Flexion 50  Extension 10  Right lateral flexion 0  Left lateral flexion 0  Right rotation 15  Left rotation 10   (Blank rows = not tested)  UPPER EXTREMITY ROM:B shoulder /UE Rom wnl   UPPER EXTREMITY MMT:  MMT Right eval Left eval  Shoulder flexion    Shoulder extension    Shoulder abduction    Shoulder adduction    Shoulder extension    Shoulder internal rotation    Shoulder external rotation  Middle trapezius    Lower trapezius    Elbow flexion 4   Elbow extension 4-    Wrist flexion    Wrist extension    Wrist ulnar deviation    Wrist radial deviation    Wrist pronation    Wrist supination    Grip strength     (Blank rows = not tested)  CERVICAL SPECIAL TESTS:  na  FUNCTIONAL TESTS:  TBD  TREATMENT DATE:  05/06/23 NuStep L5 x43mins  UBE L2 x14mins each way  Thoracic rotations standing against wall x10 Shoulder flexion with dowel, using half foam to try to keep points of contact against wall x10 Elbows/upper arm against wall trying to get them as flat as possible for thoracic and lat stretching  Pec stretch in doorway 15s x3  Grade 3 UPA to both sides to increase rotation Scar massage to reduce adhesions/increase mobility of scar tissue and PROM to end range holds  IASTM to suboccipitals and cervical paraspinals   04/29/23 UBE L2.5 x4 min forward/4 min backward for w/u, tissue perfusion Thoracic AROM/stretching all directions for postural mobility Backwards shoulder rolls for postural retraining/stretching  Chin tucks 10x3 second holds  Chin tucks + rotation 10x2 second holds Chin tucks + lateral flexion 10x2 second holds   Scar massage to reduce adhesions/increase mobility of scar tissue STM to suboccipitals and cervical paraspinals    04/26/23 NuStep L5 x20mins  Seated chin tucks 2x10 Shoulder flexion and ext with dowel 2x10 Prone over physioball horizontal abd and scaption 2x10 STM and passive stretching and end range holds for cervical ROM  04/22/23 UBE L2 x26mins  W backs against wall with gentle OP x10 w/5s hold  Wall angels x10 Seated thoracic extensions x10 with OP to get further  Seated AAROM cervical flexion with strap, then rotations 2x10  Passive rotation and stretching for UT and levator  Red band horizontal abd 2x10 Red band scapular retraction 2x10 Scapular lift off from wall x10  04/19/23:  UBE x 2 min F, 2 min B Instructed in therex to improve mid and lower thoracic spine mobility:  Side lying  spine open books,  progressing from no wt to 13 and then 2 # cuff wts, 2 sets 15 reps each Supine, with head supported, with spine on 1/2 foam roller, placed below scar site, for pec flys, alt shoulder flex Standing, leaning over physioball, 65 cm on treatment table, for ex to engage middle traps, inst to perform unilaterally , inst in horizontal shoulder abd with thumb to ceiling, and in scapular rows with ER , he had better muscular contraction and was able to avoid using upper traps better with the rows with ER  At end of session used finger ladder to stretch each shoulder into flexion for an elongation of ribs and t spine.  04/15/23 UBE L3 x44mins each way  30s chair test 10 reps Seated row 20# 2x10 Lat pull down 20# x10, 25# x10 Chin tucks against band green 2x10  Supine manual therapy to c-spine, stretching  Some use of theragun to upper trap followed by stretching   04/13/23 UBE L2 x3 mins forwards and back  Standing rows and extension red 2x10  Scapular retraction ER with red band 2x10 Horizontal abd red 2x10 AAROM c-spine with towel x10 flexion and rotation STM and manual stretches for neck  UT stretch 30s x2 both sides  Bicep curls 20# 2x10 Tricep ext 35# 2x10   04/06/22:  instructed in the following:  Access Code: [email or web address] URL: [email or web address] Date: 04/07/2023 Prepared by: Amy Speaks  Exercises - Seated Isometric Cervical Rotation  - 1 x daily - 7 x weekly - 3 sets - 10 reps - Shoulder External Rotation and Scapular Retraction with Resistance  - 1 x daily - 7 x weekly - 3 sets - 10 reps - Seated Shoulder Horizontal Abduction with Resistance  - 1 x daily - 7 x weekly - 3 sets - 10 reps   PATIENT EDUCATION:  Education details: POC, goals Person educated: Patient Education method: Explanation, Demonstration, Tactile cues, Verbal cues, and  Handouts Education comprehension: verbalized understanding, returned demonstration, verbal cues required, tactile cues required, and needs further education  HOME EXERCISE PROGRAM: Access Code: [email or web address] URL: [email or web address] Date: 04/19/2023 Prepared by: Amy Speaks  Exercises - Sit to Stand with Counter Support  - 1 x daily - 7 x weekly - 1 sets - 10 reps - Seated Hip Abduction with Resistance  - 1 x daily - 3 x weekly - 1 sets - 10 reps Access Code: [email or web address] URL: [email or web address] Date: 04/13/2023 Prepared by: Cassie Freer  Exercises - Seated Isometric Cervical Rotation  - 2 x daily - 7 x weekly - 3 sets - 10 reps - Shoulder External Rotation and Scapular Retraction with Resistance  - 2 x daily - 7 x weekly - 3 sets - 10 reps - Seated Shoulder Horizontal Abduction with Resistance  - 2 x daily - 7 x weekly - 3 sets - 10 reps - Seated Assisted Cervical Rotation with Towel  - 2 x daily - 7 x weekly - 3 sets - 10 reps - Cervical Extension AROM with Strap  - 2 x daily - 7 x weekly - 3 sets - 10 reps - Seated Cervical Sidebending Stretch  - 2 x daily - 7 x weekly - 2 reps - 30 hold  ASSESSMENT:  CLINICAL IMPRESSION: Pt arrives today doing OK, reports ongoing range of motions impairments but can feel he is getting stronger. We warmed up on the Nustep and then the UBE to focus on general mobility of cervico-thoracic region, also worked in a bit of scar massage and STM. Thoracic region also very immobile and stiff. Tried to do some thoracic extensions/lat mobility with dowel and back against wall. He is unable to do without taking low back off wall, showing he has some mobility deficits. Still very limited with PROM and end range holds with a lot of overpressure. Will continue to progress as appropriate.    OBJECTIVE IMPAIRMENTS: decreased activity tolerance, decreased endurance, decreased ROM, decreased strength, hypomobility, impaired UE functional use,  postural dysfunction, and pain.   ACTIVITY LIMITATIONS: carrying, lifting, standing, sleeping, and reach over head  PARTICIPATION LIMITATIONS: meal prep, cleaning, laundry, driving, and community activity  PERSONAL FACTORS: Age, Behavior pattern, Past/current experiences, Time since onset of injury/illness/exacerbation, and 1-2 comorbidities: Mag neoplasm kidney, and 5 surgeries due to C7 paraganglioma  are also affecting patient's functional outcome.   REHAB POTENTIAL: Good  CLINICAL DECISION MAKING: Evolving/moderate complexity  EVALUATION COMPLEXITY: Moderate   GOALS: Goals reviewed with patient? Yes  SHORT TERM GOALS: Target date: 2 weeks, 05/19/23  I HEP Baseline: initiated at eval Goal status: INITIAL  LONG TERM GOALS: Target date: 06/30/23:  Improve  Neck Disability Index score: 22 / 50 = 44.0 %  to 30% Baseline:  Goal status: INITIAL  2.  Improve cervical spine ROM for rotation from 15 % to 40 %or greater B Baseline:  Goal status: INITIAL  3.  Improve strength R triceps from 4-/5 to 4+/5 Baseline:  Goal status: INITIAL  4.  Assess 30 sec sit to stand for a functional strength and endurance test, 12 reps Baseline: 10-1/30/25 Goal status: INITIAL     PLAN:  PT FREQUENCY: 2x/week  PT DURATION: 12 weeks  PLANNED INTERVENTIONS: 97110-Therapeutic exercises, 97530- Therapeutic activity, 97112- Neuromuscular re-education, 97535- Self Care, and 40981- Manual therapy  PLAN FOR NEXT SESSION: progress with additional strengthening B Ue's ,  postural musculature, continue to gain as much ROM as possible, manual techniques as appropriate/desired. Dropping to 1x/week per his request (he is feeling overwhelmed by multiple MD appts)  Nedra Hai, PT, DPT 05/06/23 3:44 PM

## 2023-05-13 ENCOUNTER — Encounter: Payer: Self-pay | Admitting: Physical Therapy

## 2023-05-13 ENCOUNTER — Ambulatory Visit: Payer: Medicare Other | Admitting: Physical Therapy

## 2023-05-13 DIAGNOSIS — M5412 Radiculopathy, cervical region: Secondary | ICD-10-CM

## 2023-05-13 DIAGNOSIS — Z7409 Other reduced mobility: Secondary | ICD-10-CM | POA: Diagnosis not present

## 2023-05-13 DIAGNOSIS — R29898 Other symptoms and signs involving the musculoskeletal system: Secondary | ICD-10-CM | POA: Diagnosis not present

## 2023-05-13 DIAGNOSIS — M436 Torticollis: Secondary | ICD-10-CM | POA: Diagnosis not present

## 2023-05-13 NOTE — Therapy (Signed)
 OUTPATIENT PHYSICAL THERAPY CERVICAL TREATMENT   Patient Name: [name] MRN: [id number] DOB:1945/10/16, 78 y.o., male Today's Date: 05/13/2023  END OF SESSION:  PT End of Session - 05/13/23 0951     Visit Number 9    Date for PT Re-Evaluation 06/30/23    Progress Note Due on Visit 10    PT Start Time 0932    PT Stop Time 1011    PT Time Calculation (min) 39 min    Activity Tolerance Patient tolerated treatment well    Behavior During Therapy [facility] for tasks assessed/performed                     Past Medical History:  Diagnosis Date   Anxiety    Arthritis    Diverticulitis    Emphysema lung (HCC)    Family history of bladder cancer    Family history of prostate cancer    Hypertension    Inguinal hernia    Metastatic renal cell carcinoma (HCC) dx'd 2020   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   Polycythemia    phlebotomy 06/15/13   Prostate cancer (HCC) dx'd 2008   seed implant   Wears glasses    Wears hearing aid    both ears   Past Surgical History:  Procedure Laterality Date   COLONOSCOPY     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   Procedure: EXCISION LEFT DUPUYTRENS RING AND SMALL FINGERS;  Surgeon: Wyn Forster., MD;  Location:  SURGERY CENTER;  Service: Orthopedics;  Laterality: Left;   ESOPHAGOGASTRODUODENOSCOPY (EGD) WITH PROPOFOL N/A 05/12/2018   Procedure: ESOPHAGOGASTRODUODENOSCOPY (EGD) WITH PROPOFOL;  Surgeon: Carman Ching, MD;  Location: [facility];  Service: Endoscopy;  Laterality: N/A;   HERNIA REPAIR Left 2010   lt ing with lt hydrocele- inguinal   IR IMAGING GUIDED PORT INSERTION  01/20/2019   IR REMOVAL TUN ACCESS W/ PORT W/O FL MOD SED  11/21/2019   ORCHIECTOMY Right 10/29/2018   Procedure: ORCHIECTOMY radical;  Surgeon: Sebastian Ache, MD;  Location: [facility];  Service: Urology;  Laterality: Right;  75 MINS   POSTERIOR CERVICAL FUSION/FORAMINOTOMY Right 08/26/2022   Procedure: CERVICAL SEVEN-THORACIC ONE  LAMINOTOMY AND FACETECTOMY FOR RESECTION OF INTRADURAL TUMOR WITH FUSION;  Surgeon: [name], MD;  Location: [facility] OR;  Service: Neurosurgery;  Laterality: Right;  3C   RADIOACTIVE SEED IMPLANT  2006   hx of prostate   ROBOT ASSISTED LAPAROSCOPIC NEPHRECTOMY Right 06/22/2018   Procedure: XI ROBOTIC ASSISTED LAPAROSCOPIC NEPHRECTOMY;  Surgeon: Sebastian Ache, MD;  Location: [facility];  Service: Urology;  Laterality: Right;  3 HRS   SHOULDER ARTHROSCOPY  01/2012   right   Patient Active Problem List   Diagnosis Date Noted   Genetic testing 10/01/2022   Family history of prostate cancer 09/22/2022   Family history of bladder cancer 09/22/2022   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    THERAPY DIAG:  Stiffness of cervical spine  Right arm weakness  Impaired functional mobility and activity tolerance  Radiculopathy,  cervical region  Rationale for Evaluation and Treatment: Rehabilitation  ONSET DATE: 01/30/23  SUBJECTIVE:                                                                                                                                                                                                         SUBJECTIVE STATEMENT:  Nothing new, it seems to be about the same. Seems like the weather really affects it.   Hand dominance: Right  PERTINENT HISTORY:  Malignant neoplasm kidney, with metastasis, 08/26/22 had resection of C7 root due to paraganglioma, then developed CSF leak, underwent  exploratory clean out of surgical site, now referred to PT for assistance in recovery of funciton  PAIN:  Are you having pain? No unless trying to turn head, in that case it can get to 3/10  PRECAUTIONS: Other: active cancer  RED FLAGS: None     WEIGHT BEARING RESTRICTIONS: No  FALLS:  Has patient fallen in last 6 months? Yes. 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  DIAGNOSTIC FINDINGS:  Not available on epic but pt demonstrated to therapist pictures of x rays with multilevel rods B c spine extending C3 to C 7  PATIENT SURVEYS:  NDI Neck Disability Index score: 22 / 50 = 44.0 %  COGNITION: Overall cognitive status: Within functional limits for tasks assessed  SENSATION: Light touch: diminished   POSTURE: rounded shoulders, forward head, and increased thoracic kyphosis  PALPATION: Non tender B upper traps and levator.   Assessed c spine ROM in neutral and with cervical flexion to isolate upper cervical motion.  Pt more restricted with upper cervical movement   CERVICAL ROM:   Active ROM A/PROM (deg) eval  Flexion 50  Extension 10  Right lateral flexion 0  Left lateral flexion 0  Right rotation 15  Left rotation 10   (Blank rows = not tested)  UPPER EXTREMITY ROM:B shoulder /UE Rom wnl   UPPER EXTREMITY MMT:  MMT Right eval Left eval  Shoulder flexion    Shoulder extension    Shoulder abduction    Shoulder adduction    Shoulder extension    Shoulder internal rotation    Shoulder external rotation    Middle  trapezius    Lower trapezius    Elbow flexion 4   Elbow extension 4-   Wrist  flexion    Wrist extension    Wrist ulnar deviation    Wrist radial deviation    Wrist pronation    Wrist supination    Grip strength     (Blank rows = not tested)  CERVICAL SPECIAL TESTS:  na  FUNCTIONAL TESTS:  TBD  TREATMENT DATE:   05/13/23  Education about time frames recovering from surgery especially given he has had multiple procedures, scar tissue, role of t-spine in  cervical mechanics and return to golf, effect of barometric pressure changes on joints/pain levels, education that CSF leak likely did not affect muscle tissue itself negatively  Thoracic ROM all directions x15 UBE L3x4 min forward/backward (8 total) Chin tucks 12x5 seconds B Thoracic rotations with dowel (easy and slow) x20 Chin tucks + rotation x10 B Chin tucks + lateral flexion x10 B Corner pec stretch 3x30 seconds       05/06/23 NuStep L5 x57mins  UBE L2 x75mins each way  Thoracic rotations standing against wall x10 Shoulder flexion with dowel, using half foam to try to keep points of contact against wall x10 Elbows/upper arm against wall trying to get them as flat as possible for thoracic and lat stretching  Pec stretch in doorway 15s x3  Grade 3 UPA to both sides to increase rotation Scar massage to reduce adhesions/increase mobility of scar tissue and PROM to end range holds  IASTM to suboccipitals and cervical paraspinals   04/29/23 UBE L2.5 x4 min forward/4 min backward for w/u, tissue perfusion Thoracic AROM/stretching all directions for postural mobility Backwards shoulder rolls for postural retraining/stretching  Chin tucks 10x3 second holds  Chin tucks + rotation 10x2 second holds Chin tucks + lateral flexion 10x2 second holds   Scar massage to reduce adhesions/increase mobility of scar tissue STM to suboccipitals and cervical paraspinals    04/26/23 NuStep L5 x68mins  Seated chin tucks 2x10 Shoulder flexion and ext with dowel 2x10 Prone over physioball horizontal abd and  scaption 2x10 STM and passive stretching and end range holds for cervical ROM  04/22/23 UBE L2 x95mins  W backs against wall with gentle OP x10 w/5s hold  Wall angels x10 Seated thoracic extensions x10 with OP to get further  Seated AAROM cervical flexion with strap, then rotations 2x10  Passive rotation and stretching for UT and levator  Red band horizontal abd 2x10 Red band scapular retraction 2x10 Scapular lift off from wall x10  04/19/23:  UBE x 2 min F, 2 min B Instructed in therex to improve mid and lower thoracic spine mobility:  Side lying  spine open books, progressing from no wt to 13 and then 2 # cuff wts, 2 sets 15 reps each Supine, with head supported, with spine on 1/2 foam roller, placed below scar site, for pec flys, alt shoulder flex Standing, leaning over physioball, 65 cm on treatment table, for ex to engage middle traps, inst to perform unilaterally , inst in horizontal shoulder abd with thumb to ceiling, and in scapular rows with ER , he had better muscular contraction and was able to avoid using upper traps better with the rows with ER  At end of session used finger ladder to stretch each shoulder into flexion for an elongation of ribs and t spine.  04/15/23 UBE L3 x54mins each way  30s chair test 10 reps Seated row 20# 2x10 Lat pull down  20# x10, 25# x10 Chin tucks against band green 2x10  Supine manual therapy to c-spine, stretching  Some use of theragun to upper trap followed by stretching   04/13/23 UBE L2 x3 mins forwards and back  Standing rows and extension red 2x10  Scapular retraction ER with red band 2x10 Horizontal abd red 2x10 AAROM c-spine with towel x10 flexion and rotation STM and manual stretches for neck  UT stretch 30s x2 both sides  Bicep curls 20# 2x10 Tricep ext 35# 2x10   04/06/22:  instructed in the following:                                                                                                                              Access Code: [email or web address] URL: [email or web address] Date: 04/07/2023 Prepared by: Amy Speaks  Exercises - Seated Isometric Cervical Rotation  - 1 x daily - 7 x weekly - 3 sets - 10 reps - Shoulder External Rotation and Scapular Retraction with Resistance  - 1 x daily - 7 x weekly - 3 sets - 10 reps - Seated Shoulder Horizontal Abduction with Resistance  - 1 x daily - 7 x weekly - 3 sets - 10 reps   PATIENT EDUCATION:  Education details: POC, goals Person educated: Patient Education method: Explanation, Demonstration, Tactile cues, Verbal cues, and Handouts Education comprehension: verbalized understanding, returned demonstration, verbal cues required, tactile cues required, and needs further education  HOME EXERCISE PROGRAM: Access Code: [email or web address] URL: [email or web address] Date: 04/19/2023 Prepared by: Amy Speaks  Exercises - Sit to Stand with Counter Support  - 1 x daily - 7 x weekly - 1 sets - 10 reps - Seated Hip Abduction with Resistance  - 1 x daily - 3 x weekly - 1 sets - 10 reps  Access Code: [email or web address] URL: [email or web address] Date: 05/13/2023 Prepared by: Nedra Hai  Exercises - Seated Isometric Cervical Rotation  - 2 x daily - 7 x weekly - 3 sets - 10 reps - Shoulder External Rotation and Scapular Retraction with Resistance  - 2 x daily - 7 x weekly - 3 sets - 10 reps - Seated Shoulder Horizontal Abduction with Resistance  - 2 x daily - 7 x weekly - 3 sets - 10 reps - Seated Assisted Cervical Rotation with Towel  - 2 x daily - 7 x weekly - 3 sets - 10 reps - Cervical Extension AROM with Strap  - 2 x daily - 7 x weekly - 3 sets - 10 reps - Seated Cervical Sidebending Stretch  - 2 x daily - 7 x weekly - 2 reps - 30 hold - Standing Thoracic Rotation with Dowel  - 1 x daily - 7 x weekly - 20 reps - Seated Cervical Retraction  - 1 x daily - 7 x weekly - 1 sets - 10 reps - 3 seconds  hold - Seated Cervical Retraction and  Rotation  - 1 x daily - 7 x weekly - 1  sets - 10 reps - 5 seconds  hold - Standing Cervical Retraction with Sidebending  - 1 x daily - 7 x weekly - 1 sets - 10 reps - 5 seconds  hold  ASSESSMENT:  CLINICAL IMPRESSION:   Pt arrives today doing OK, he feels like he is making headway but slowly- weather does affect him, hopefully as the weather gets warmer he notices a larger improvement in symptoms. We did discuss role of time in his recovery especially given multiple procedures. Continued working on general cervico-thoracic mobility with progressions as able. Updated HEP given that he is only coming 1x/week at this point.     OBJECTIVE IMPAIRMENTS: decreased activity tolerance, decreased endurance, decreased ROM, decreased strength, hypomobility, impaired UE functional use, postural dysfunction, and pain.   ACTIVITY LIMITATIONS: carrying, lifting, standing, sleeping, and reach over head  PARTICIPATION LIMITATIONS: meal prep, cleaning, laundry, driving, and community activity  PERSONAL FACTORS: Age, Behavior pattern, Past/current experiences, Time since onset of injury/illness/exacerbation, and 1-2 comorbidities: Mag neoplasm kidney, and 5 surgeries due to C7 paraganglioma  are also affecting patient's functional outcome.   REHAB POTENTIAL: Good  CLINICAL DECISION MAKING: Evolving/moderate complexity  EVALUATION COMPLEXITY: Moderate   GOALS: Goals reviewed with patient? Yes  SHORT TERM GOALS: Target date: 2 weeks, 05/19/23  I HEP Baseline: initiated at eval Goal status: INITIAL  LONG TERM GOALS: Target date: 06/30/23:  Improve  Neck Disability Index score: 22 / 50 = 44.0 %  to 30% Baseline:  Goal status: INITIAL  2.  Improve cervical spine ROM for rotation from 15 % to 40 %or greater B Baseline:  Goal status: INITIAL  3.  Improve strength R triceps from 4-/5 to 4+/5 Baseline:  Goal status: INITIAL  4.  Assess 30 sec sit to stand for a functional strength and endurance  test, 12 reps Baseline: 10-1/30/25 Goal status: INITIAL     PLAN:  PT FREQUENCY: 2x/week  PT DURATION: 12 weeks  PLANNED INTERVENTIONS: 97110-Therapeutic exercises, 97530- Therapeutic activity, 97112- Neuromuscular re-education, 97535- Self Care, and 16109- Manual therapy  PLAN FOR NEXT SESSION: progress with additional strengthening B Ue's ,  postural musculature, continue to gain as much ROM as possible, manual techniques as appropriate/desired. Dropping to 1x/week per his request (he is feeling overwhelmed by multiple MD appts). 10th visit progress note next visit   Nedra Hai, PT, DPT 05/13/23 10:11 AM

## 2023-05-20 ENCOUNTER — Encounter: Payer: Self-pay | Admitting: Physical Therapy

## 2023-05-20 ENCOUNTER — Ambulatory Visit: Payer: Medicare Other | Attending: Family Medicine | Admitting: Physical Therapy

## 2023-05-20 DIAGNOSIS — M5412 Radiculopathy, cervical region: Secondary | ICD-10-CM | POA: Diagnosis not present

## 2023-05-20 DIAGNOSIS — M436 Torticollis: Secondary | ICD-10-CM | POA: Insufficient documentation

## 2023-05-20 DIAGNOSIS — Z7409 Other reduced mobility: Secondary | ICD-10-CM | POA: Diagnosis not present

## 2023-05-20 DIAGNOSIS — R29898 Other symptoms and signs involving the musculoskeletal system: Secondary | ICD-10-CM | POA: Diagnosis not present

## 2023-05-20 NOTE — Therapy (Signed)
 OUTPATIENT PHYSICAL THERAPY CERVICAL TREATMENT/PROGRESS NOTE    Patient Name: [name] MRN: [id number] DOB:October 31, 1945, 78 y.o., male Today's Date: 05/20/2023  Progress Note Reporting Period 04/07/23 to 05/20/23  See note below for Objective Data and Assessment of Progress/Goals.      END OF SESSION:  PT End of Session - 05/20/23 0846     Visit Number 10    Date for PT Re-Evaluation 06/30/23    Authorization Type MCR    Progress Note Due on Visit 20    PT Start Time 0846    PT Stop Time 0927    PT Time Calculation (min) 41 min    Activity Tolerance Patient tolerated treatment well    Behavior During Therapy [facility] for tasks assessed/performed                      Past Medical History:  Diagnosis Date   Anxiety    Arthritis    Diverticulitis    Emphysema lung (HCC)    Family history of bladder cancer    Family history of prostate cancer    Hypertension    Inguinal hernia    Metastatic renal cell carcinoma (HCC) dx'd 2020   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   Polycythemia    phlebotomy 06/15/13   Prostate cancer (HCC) dx'd 2008   seed implant   Wears glasses    Wears hearing aid    both ears   Past Surgical History:  Procedure Laterality Date   COLONOSCOPY     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   Procedure: EXCISION LEFT DUPUYTRENS RING AND SMALL FINGERS;  Surgeon: Wyn Forster., MD;  Location: [facility];  Service: Orthopedics;  Laterality: Left;   ESOPHAGOGASTRODUODENOSCOPY (EGD) WITH PROPOFOL N/A 05/12/2018   Procedure: ESOPHAGOGASTRODUODENOSCOPY (EGD) WITH PROPOFOL;  Surgeon: Carman Ching, MD;  Location: [facility];  Service: Endoscopy;  Laterality: N/A;   HERNIA REPAIR Left 2010   lt ing with lt hydrocele- inguinal   IR IMAGING GUIDED PORT INSERTION  01/20/2019   IR REMOVAL TUN ACCESS W/ PORT W/O FL MOD SED  11/21/2019   ORCHIECTOMY Right 10/29/2018   Procedure: ORCHIECTOMY radical;  Surgeon: Sebastian Ache, MD;  Location: [facility];  Service: Urology;  Laterality: Right;  75 MINS   POSTERIOR CERVICAL FUSION/FORAMINOTOMY Right 08/26/2022   Procedure: CERVICAL SEVEN-THORACIC ONE LAMINOTOMY AND FACETECTOMY FOR RESECTION OF INTRADURAL TUMOR WITH FUSION;  Surgeon: [name], MD;  Location: [facility];  Service: Neurosurgery;  Laterality: Right;  3C   RADIOACTIVE SEED IMPLANT  2006   hx of prostate   ROBOT ASSISTED LAPAROSCOPIC NEPHRECTOMY Right 06/22/2018   Procedure: XI ROBOTIC ASSISTED LAPAROSCOPIC NEPHRECTOMY;  Surgeon: Sebastian Ache, MD;  Location: [facility];  Service: Urology;  Laterality: Right;  3 HRS   SHOULDER ARTHROSCOPY  01/2012   right   Patient Active Problem List   Diagnosis Date Noted   Genetic testing 10/01/2022   Family history of prostate cancer 09/22/2022   Family history of bladder cancer 09/22/2022   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    THERAPY DIAG:  Stiffness of cervical spine  Right arm weakness  Impaired functional mobility and activity tolerance  Radiculopathy, cervical region  Rationale for Evaluation and Treatment: Rehabilitation  ONSET DATE: 01/30/23  SUBJECTIVE:                                                                                                                                                                                                         SUBJECTIVE STATEMENT:  Weather has me feeling really sore today. I was sore for awhile after last time but my neck  moved better after. Nothing really new. Would put myself at 75% of where I'd like to be, feel like I don't have strength or mobility like I used to before all this started.   Hand dominance: Right  PERTINENT HISTORY:  Malignant neoplasm kidney, with metastasis, 08/26/22 had resection of C7 root due to paraganglioma, then developed CSF leak, underwent exploratory clean out of surgical site, now referred to PT for assistance in recovery of funciton  PAIN:  Are you having pain? No "just sore, not registering on NPRS"   PRECAUTIONS: Other: active cancer  RED FLAGS: None     WEIGHT BEARING RESTRICTIONS: No  FALLS:  Has patient fallen in last 6 months? Yes. 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  DIAGNOSTIC FINDINGS:  Not available on epic but pt demonstrated to therapist pictures of x rays with multilevel rods B c spine extending C3 to C 7  PATIENT SURVEYS:  NDI Neck Disability Index score: 22 / 50 = 44.0 %;  05/20/23 15/50 30%  COGNITION: Overall cognitive status: Within functional limits for tasks assessed  SENSATION: Light touch: diminished   POSTURE: rounded shoulders, forward head, and increased thoracic kyphosis  PALPATION: Non tender B upper traps and levator.   Assessed c spine ROM in neutral and with cervical flexion to isolate upper cervical motion.  Pt more restricted with upper cervical movement   CERVICAL ROM:   Active ROM A/PROM (deg) eval AROM 05/20/23  Flexion 50 41*  Extension 10 7*  Right lateral flexion 0 7*  Left lateral flexion 0 7*  Right  rotation 15 40*  Left rotation 10 20*    (Blank rows = not tested)  UPPER EXTREMITY ROM:B shoulder /UE Rom wnl   UPPER EXTREMITY MMT:  MMT Right eval Right 05/20/23  Shoulder flexion  4+  Shoulder extension    Shoulder abduction  4+  Shoulder adduction    Shoulder extension    Shoulder internal rotation    Shoulder external rotation    Middle trapezius    Lower trapezius    Elbow flexion 4 4+  Elbow extension 4- 5  Wrist flexion    Wrist extension    Wrist ulnar deviation    Wrist radial deviation    Wrist pronation    Wrist supination    Grip strength     (Blank rows = not tested)  CERVICAL SPECIAL TESTS:  na  FUNCTIONAL TESTS:  TBD  TREATMENT DATE:   05/20/23  NDI, objective measures as above, education on progress with PT/towards goals, POC moving forward  UBE L3x4 min forward/4 backwards for w/u prior to measures  Thoracic rotations (standing) with 1# dowel over shoulders x20 alternating  Replicated partial golf swings 1# rod x10  Replicated full golf swings 1# rod x10       05/13/23  Education about time frames recovering from surgery especially given he has had multiple procedures, scar tissue, role of t-spine in  cervical mechanics and return to golf, effect of barometric pressure changes on joints/pain levels, education that CSF leak likely did not affect muscle tissue itself negatively  Thoracic ROM all directions x15 UBE L3x4 min forward/backward (8 total) Chin tucks 12x5 seconds B Thoracic rotations with dowel (easy and slow) x20 Chin tucks + rotation x10 B Chin tucks + lateral flexion x10 B Corner pec stretch 3x30 seconds       05/06/23 NuStep L5 x34mins  UBE L2 x108mins each way  Thoracic rotations standing against wall x10 Shoulder flexion with dowel, using half foam to try to keep points of contact against wall x10 Elbows/upper arm against wall trying to get them as flat as possible for thoracic and lat stretching  Pec stretch in doorway 15s x3  Grade 3 UPA to both sides to  increase rotation Scar massage to reduce adhesions/increase mobility of scar tissue and PROM to end range holds  IASTM to suboccipitals and cervical paraspinals   04/29/23 UBE L2.5 x4 min forward/4 min backward for w/u, tissue perfusion Thoracic AROM/stretching all directions for postural mobility Backwards shoulder rolls for postural retraining/stretching  Chin tucks 10x3 second holds  Chin tucks + rotation 10x2 second holds Chin tucks + lateral flexion 10x2 second holds   Scar massage to reduce adhesions/increase mobility of scar tissue STM to suboccipitals and cervical paraspinals    04/26/23 NuStep L5 x36mins  Seated chin tucks 2x10 Shoulder flexion and ext with dowel 2x10 Prone over physioball horizontal abd and scaption 2x10 STM and passive stretching and end range holds for cervical ROM  04/22/23 UBE L2 x51mins  W backs against wall with gentle OP x10 w/5s hold  Wall angels x10 Seated thoracic extensions x10 with OP to get further  Seated AAROM cervical flexion with strap, then rotations 2x10  Passive rotation and stretching for UT and levator  Red band horizontal abd 2x10 Red band scapular retraction 2x10 Scapular lift off from wall x10  Access Code: [email or web address] URL: [email or web address] Date: 04/07/2023 Prepared by: Amy Speaks  Exercises - Seated Isometric Cervical Rotation  - 1 x daily - 7 x weekly - 3 sets - 10 reps - Shoulder External Rotation and Scapular Retraction with Resistance  - 1 x daily - 7 x weekly - 3 sets - 10 reps - Seated Shoulder Horizontal Abduction with Resistance  - 1 x daily - 7 x weekly - 3 sets - 10 reps   PATIENT EDUCATION:  Education details: POC, goals Person educated: Patient Education method: Explanation, Demonstration, Tactile cues, Verbal cues, and Handouts Education comprehension: verbalized  understanding, returned demonstration, verbal cues required, tactile cues required, and needs further education  HOME EXERCISE PROGRAM: Access Code: [email or web address] URL: [email or web address] Date: 04/19/2023 Prepared by: Amy Speaks  Exercises - Sit to Stand with Counter Support  - 1 x daily - 7 x weekly - 1 sets - 10 reps - Seated Hip Abduction with Resistance  - 1 x daily - 3 x weekly - 1 sets - 10 reps  Access Code: [email or web address] URL: [email or web address] Date: 05/13/2023 Prepared by: Nedra Hai  Exercises - Seated Isometric Cervical Rotation  - 2 x daily - 7 x weekly - 3 sets - 10 reps - Shoulder External Rotation and Scapular Retraction with Resistance  - 2 x daily - 7 x weekly - 3 sets - 10 reps - Seated Shoulder Horizontal Abduction with Resistance  - 2 x daily - 7 x weekly - 3 sets - 10 reps - Seated Assisted Cervical Rotation with Towel  - 2 x daily - 7 x weekly - 3 sets - 10 reps - Cervical Extension AROM with Strap  - 2 x daily - 7 x weekly - 3 sets - 10 reps - Seated Cervical Sidebending Stretch  - 2 x daily - 7 x weekly - 2 reps - 30 hold - Standing Thoracic Rotation with Dowel  - 1 x daily - 7 x weekly - 20 reps - Seated Cervical Retraction  - 1 x daily - 7 x weekly - 1 sets - 10 reps - 3 seconds  hold - Seated Cervical Retraction and Rotation  - 1 x daily - 7 x weekly - 1 sets - 10 reps - 5 seconds  hold - Standing Cervical Retraction with Sidebending  - 1 x daily - 7 x weekly - 1 sets - 10 reps - 5 seconds  hold  ASSESSMENT:  CLINICAL IMPRESSION:   We focused on getting objective measures and goal review for today's progress note. Unfortunately he was feeling a bit more sore today due to the fluctuating weather we have had this week but I think this is as expected given multiple surgeries. He was generally stiff today but still showed good improvement towards all goals. Unfortunately he will be really busy in late March/early April with cancer  scans/multiple 6 month MD checks and would like to hold PT around that time due to busy schedule- he will take a look at his formal schedule and let us know what he would like to do. Did encourage him to complete formal PT POC if able.    OBJECTIVE IMPAIRMENTS: decreased activity tolerance, decreased endurance, decreased ROM, decreased strength, hypomobility, impaired UE functional use, postural dysfunction, and pain.   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   REHAB POTENTIAL: Good  CLINICAL DECISION MAKING: Evolving/moderate complexity  EVALUATION COMPLEXITY: Moderate   GOALS: Goals reviewed with patient? Yes  SHORT TERM GOALS: Target date: 2 weeks, 05/19/23  I HEP Baseline: initiated at eval Goal status: MET 05/20/23  LONG TERM GOALS: Target date: 06/30/23:  Improve  Neck Disability Index score: 22 / 50 = 44.0 %  to 30% Baseline:  Goal status: ONGOING 05/20/23  2.  Improve cervical spine ROM for rotation from 15 % to 40 %or greater B Baseline:  Goal status: ONGOING 05/20/23  3.  Improve strength R triceps from 4-/5 to 4+/5 Baseline:  Goal status: MET 05/20/23  4.  Assess 30 sec sit to stand for a functional strength and endurance test, 12 reps Baseline: 10-1/30/25; 05/20/23- 11 Goal status: ONGOING 05/20/23     PLAN:  PT FREQUENCY: 2x/week  PT DURATION: 12 weeks  PLANNED INTERVENTIONS: 97110-Therapeutic exercises, 97530- Therapeutic activity, 97112- Neuromuscular re-education, 97535- Self Care, and 16109- Manual therapy  PLAN FOR NEXT SESSION: progress with additional strengthening B  Ue's ,  postural musculature, continue to gain as much ROM as possible, manual techniques as appropriate/desired. Dropping to 1x/week per his request (he is feeling overwhelmed by multiple MD appts). Work on functional activities leading up to golf   Nedra Hai, PT, DPT 05/20/23 9:28 AM

## 2023-06-16 ENCOUNTER — Other Ambulatory Visit: Payer: Medicare Other

## 2023-06-17 ENCOUNTER — Other Ambulatory Visit: Payer: Self-pay

## 2023-06-17 ENCOUNTER — Ambulatory Visit (HOSPITAL_COMMUNITY)
Admission: RE | Admit: 2023-06-17 | Discharge: 2023-06-17 | Disposition: A | Discharge status: Home / Self Care | Source: Ambulatory Visit | Attending: Physician Assistant | Admitting: Physician Assistant

## 2023-06-17 ENCOUNTER — Inpatient Hospital Stay: Attending: Internal Medicine

## 2023-06-17 DIAGNOSIS — C641 Malignant neoplasm of right kidney, except renal pelvis: Secondary | ICD-10-CM | POA: Diagnosis not present

## 2023-06-17 DIAGNOSIS — Z905 Acquired absence of kidney: Secondary | ICD-10-CM | POA: Insufficient documentation

## 2023-06-17 DIAGNOSIS — J432 Centrilobular emphysema: Secondary | ICD-10-CM | POA: Diagnosis not present

## 2023-06-17 DIAGNOSIS — C649 Malignant neoplasm of unspecified kidney, except renal pelvis: Secondary | ICD-10-CM

## 2023-06-17 DIAGNOSIS — Z9079 Acquired absence of other genital organ(s): Secondary | ICD-10-CM | POA: Insufficient documentation

## 2023-06-17 DIAGNOSIS — D751 Secondary polycythemia: Secondary | ICD-10-CM | POA: Insufficient documentation

## 2023-06-17 DIAGNOSIS — Z8546 Personal history of malignant neoplasm of prostate: Secondary | ICD-10-CM | POA: Insufficient documentation

## 2023-06-17 DIAGNOSIS — Z85528 Personal history of other malignant neoplasm of kidney: Secondary | ICD-10-CM | POA: Insufficient documentation

## 2023-06-17 DIAGNOSIS — R911 Solitary pulmonary nodule: Secondary | ICD-10-CM | POA: Diagnosis not present

## 2023-06-17 LAB — CMP (CANCER CENTER ONLY)
ALT: 13 U/L (ref 0–44)
AST: 16 U/L (ref 15–41)
Albumin: 4.3 g/dL (ref 3.5–5.0)
Alkaline Phosphatase: 59 U/L (ref 38–126)
Anion gap: 5 (ref 5–15)
BUN: 18 mg/dL (ref 8–23)
CO2: 27 mmol/L (ref 22–32)
Calcium: 9.5 mg/dL (ref 8.9–10.3)
Chloride: 107 mmol/L (ref 98–111)
Creatinine: 1.28 mg/dL — ABNORMAL HIGH (ref 0.61–1.24)
GFR, Estimated: 58 mL/min — ABNORMAL LOW (ref >60)
Glucose, Bld: 107 mg/dL — ABNORMAL HIGH (ref 70–99)
Potassium: 4 mmol/L (ref 3.5–5.1)
Sodium: 139 mmol/L (ref 135–145)
Total Bilirubin: 0.8 mg/dL (ref 0.0–1.2)
Total Protein: 7.3 g/dL (ref 6.5–8.1)

## 2023-06-17 LAB — CBC WITH DIFFERENTIAL (CANCER CENTER ONLY)
Abs Immature Granulocytes: 0.02 10*3/uL (ref 0.00–0.07)
Basophils Absolute: 0.1 10*3/uL (ref 0.0–0.1)
Basophils Relative: 1 %
Eosinophils Absolute: 0.2 10*3/uL (ref 0.0–0.5)
Eosinophils Relative: 4 %
HCT: 52.5 % — ABNORMAL HIGH (ref 39.0–52.0)
Hemoglobin: 17.9 g/dL — ABNORMAL HIGH (ref 13.0–17.0)
Immature Granulocytes: 0 %
Lymphocytes Relative: 31 %
Lymphs Abs: 1.6 10*3/uL (ref 0.7–4.0)
MCH: 27.8 pg (ref 26.0–34.0)
MCHC: 34.1 g/dL (ref 30.0–36.0)
MCV: 81.5 fL (ref 80.0–100.0)
Monocytes Absolute: 0.6 10*3/uL (ref 0.1–1.0)
Monocytes Relative: 11 %
Neutro Abs: 2.6 10*3/uL (ref 1.7–7.7)
Neutrophils Relative %: 53 %
Platelet Count: 198 10*3/uL (ref 150–400)
RBC: 6.44 MIL/uL — ABNORMAL HIGH (ref 4.22–5.81)
RDW: 13.5 % (ref 11.5–15.5)
WBC Count: 5 10*3/uL (ref 4.0–10.5)
nRBC: 0 % (ref 0.0–0.2)

## 2023-06-17 NOTE — Progress Notes (Signed)
 [facility] OFFICE PROGRESS NOTE  Irena Reichmann, DO 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 2) reactive polycythemia diagnosed in 2010.  PRIOR THERAPY: 1) status post radical nephrectomy in April 2020 and the final pathology showed clear-cell histology invading into the perineural fat indicating stage IIIa with negative tumor margin. 2) status post orchiectomy completed October 29, 2018 and the final pathology showed metastatic carcinoma consistent with clear-cell histology. 3) status post treatment with Keytruda 200 Mg IV every 3 weeks in addition to axitinib 5 mg p.o. daily started January 26, 2019.  Treatment discontinued in May 2021 based on the patient's preference and complete response.  Status post 11 cycles.  CURRENT THERAPY: Observation and intermittent phlebotomy for the polycythemia.   INTERVAL HISTORY: [name] 78 y.o. male returns to the clinic today for a follow-up visit.  The patient was previously followed by Dr. Clelia Croft and establish care with Dr. Arbutus Ped in March 2024.  The patient is followed for polycythemia and renal cell carcinoma. He receives phlebotomies PRN. The patient is seen every 6 months.   The patient is followed by neuro-oncology due to resection of a cervical paraganglioma with Dr. Conchita Paris. He had 5 surgeries, the most recent in November.    Otherwise he denies any recent fever, chills, night sweats, or unexplained weight loss. Denies any adenopathy.  Denies any chest pain, cough, or hemoptysis.  He denies any new pain. Denies any blood in the urine, malodorous urine, or dysuria.  Denies any nausea, vomiting, diarrhea, or constipation. He reports some fatigue and weakness from being inactive from his surgeries over the last 1.5 years  but reports he is getting stronger  He is here today for evaluation and to review his recent CT scan the scan.   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  MEDICATIONS:  Current Outpatient Medications  Medication Sig Dispense Refill   acetaZOLAMIDE (DIAMOX) 250 MG tablet Take 250 mg by mouth every 12 (twelve) hours.     aspirin EC 81 MG tablet Take 81 mg by mouth daily. Swallow whole. (Patient not taking: Reported on 01/23/2023)     busPIRone (BUSPAR) 5 MG tablet Take 5 mg by mouth 2 (two) times daily as needed (for anxiety).     Carboxymethylcellulose Sodium (THERATEARS OP) Place 1 drop into both eyes 3 (three) times daily as needed (for dryness).     docusate sodium (COLACE) 100 MG capsule Take 100-200 mg by mouth daily as needed for mild constipation.     fluticasone (FLONASE) 50 MCG/ACT nasal spray Place 1 spray into both nostrils daily as needed for allergies.      HYDROmorphone (DILAUDID) 2 MG tablet Take 2 mg by mouth every 4 (  four) hours as needed (for pain- wean as directed).     losartan-hydrochlorothiazide (HYZAAR) 100-25 MG tablet Take 1 tablet by mouth daily.     methocarbamol (ROBAXIN) 750 MG tablet Take 750 mg by mouth 3 (three) times daily as needed for muscle spasms.     ondansetron (ZOFRAN-ODT) 4 MG disintegrating tablet Take 4 mg by mouth every 8 (eight) hours as needed for nausea or vomiting (dissolve orally).     PEPCID AC MAXIMUM STRENGTH 20 MG tablet Take 20 mg by mouth daily  as needed for heartburn or indigestion.     pravastatin (PRAVACHOL) 40 MG tablet Take 40 mg by mouth daily.     pregabalin (LYRICA) 75 MG capsule Take 75 mg by mouth 2 (two) times daily.     TYLENOL 500 MG tablet Take 1,000 mg by mouth every 6 (six) hours as needed for mild pain (pain score 1-3) or headache.     No current facility-administered medications for this visit.    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   Procedure: EXCISION LEFT DUPUYTRENS RING AND SMALL FINGERS;  Surgeon: Wyn Forster., MD;  Location: [facility];  Service: Orthopedics;  Laterality: Left;   ESOPHAGOGASTRODUODENOSCOPY (EGD) WITH PROPOFOL N/A 05/12/2018   Procedure: ESOPHAGOGASTRODUODENOSCOPY (EGD) WITH PROPOFOL;  Surgeon: Carman Ching, MD;  Location: [facility];  Service: Endoscopy;  Laterality: N/A;   HERNIA REPAIR Left 2010   lt ing with lt hydrocele- inguinal   IR IMAGING GUIDED PORT INSERTION  01/20/2019   IR REMOVAL TUN ACCESS W/ PORT W/O FL MOD SED  11/21/2019   ORCHIECTOMY Right 10/29/2018   Procedure: ORCHIECTOMY radical;  Surgeon: Sebastian Ache, MD;  Location: [facility];  Service: Urology;  Laterality: Right;  75 MINS   POSTERIOR CERVICAL FUSION/FORAMINOTOMY Right 08/26/2022   Procedure: CERVICAL SEVEN-THORACIC ONE LAMINOTOMY AND FACETECTOMY FOR RESECTION OF INTRADURAL TUMOR WITH FUSION;  Surgeon: [name], MD;  Location: [facility];  Service: Neurosurgery;  Laterality: Right;  3C   RADIOACTIVE SEED IMPLANT  2006   hx of prostate   ROBOT ASSISTED LAPAROSCOPIC NEPHRECTOMY Right 06/22/2018   Procedure: XI ROBOTIC ASSISTED LAPAROSCOPIC NEPHRECTOMY;  Surgeon: Sebastian Ache, MD;  Location: [facility];  Service: Urology;  Laterality: Right;  3 HRS   SHOULDER ARTHROSCOPY  01/2012   right    REVIEW OF SYSTEMS:   Review of Systems  Constitutional: Positive for fatigue. Negative for  appetite change, chills, fever and unexpected weight change.  HENT: Negative for mouth sores, nosebleeds, sore throat and trouble swallowing.   Eyes: Negative for eye problems and icterus.  Respiratory: Negative for cough, hemoptysis, shortness of breath and wheezing.   Cardiovascular: Negative for chest pain and leg swelling.  Gastrointestinal: Negative for abdominal pain, constipation, diarrhea, nausea and vomiting.  Genitourinary: Negative for bladder incontinence, difficulty urinating, dysuria, frequency and hematuria.   Musculoskeletal: Negative for gait problem, neck pain and neck stiffness.  Skin: Positive for flushing. Negative for itching and rash.  Neurological: Negative for dizziness, extremity weakness, gait problem, headaches, light-headedness and seizures.  Hematological: Negative for adenopathy. Does not bruise/bleed easily.  Psychiatric/Behavioral: Negative for confusion, depression and sleep disturbance. The patient is not nervous/anxious.     PHYSICAL EXAMINATION:  There were no vitals taken for this visit.  ECOG PERFORMANCE STATUS: 0-1  Physical Exam  Constitutional: Oriented to person, place, and time and well-developed, well-nourished, and in no  distress.  HENT:  Head: Normocephalic and atraumatic.  Mouth/Throat: Oropharynx is clear and moist. No oropharyngeal exudate.  Eyes: Conjunctivae are normal. Right eye exhibits no discharge. Left eye exhibits no discharge. No scleral icterus.  Neck: Normal range of motion. Neck supple.  Cardiovascular: Normal rate, regular rhythm, normal heart sounds and intact distal pulses.   Pulmonary/Chest: Effort normal and breath sounds normal. No respiratory distress. No wheezes. No rales.  Abdominal: Soft. Bowel sounds are normal. Exhibits no distension and no mass. There is no tenderness.  Musculoskeletal: Positive for decreased ROM of the neck. Normal range of motion. Exhibits no edema.  Lymphadenopathy:    No cervical adenopathy.   Neurological: Alert and oriented to person, place, and time. Exhibits normal muscle tone. Gait normal. Coordination normal.  Skin: Skin is warm and dry. No rash noted. Not diaphoretic. No erythema. No pallor.  Psychiatric: Mood, memory and judgment normal.  Vitals reviewed.  LABORATORY DATA: Lab Results  Component Value Date   WBC 5.0 06/17/2023   HGB 17.9 (H) 06/17/2023   HCT 52.5 (H) 06/17/2023   MCV 81.5 06/17/2023   PLT 198 06/17/2023      Chemistry      Component Value Date/Time   NA 139 06/17/2023 0958   NA 141 01/18/2015 0835   K 4.0 06/17/2023 0958   K 4.1 01/18/2015 0835   CL 107 06/17/2023 0958   CO2 27 06/17/2023 0958   CO2 25 01/18/2015 0835   BUN 18 06/17/2023 0958   BUN 21.5 01/18/2015 0835   CREATININE 1.28 (H) 06/17/2023 0958   CREATININE 1.1 01/18/2015 0835      Component Value Date/Time   CALCIUM 9.5 06/17/2023 0958   CALCIUM 9.8 01/18/2015 0835   ALKPHOS 59 06/17/2023 0958   ALKPHOS 66 01/18/2015 0835   AST 16 06/17/2023 0958   AST 15 01/18/2015 0835   ALT 13 06/17/2023 0958   ALT 16 01/18/2015 0835   BILITOT 0.8 06/17/2023 0958   BILITOT 0.91 01/18/2015 0835       RADIOGRAPHIC STUDIES:  No results found.   ASSESSMENT/PLAN:  This is a very pleasant 78 year old Caucasian male with a history of stage IV renal cell carcinoma that was diagnosed as a stage III in April 2020.  He had evidence of spermatic cord involvement in August 2020.  He is status post right radical nephrectomy in April 2020 and the pathology showed clear-cell renal cell carcinoma invading the perineural fat indicating stage T3a with negative margins.  He is also status ports orchiectomy in August 2020 and the final pathology showed metastatic carcinoma consistent with clear-cell histology.   He underwent 11 cycles of Keytruda and axitinib between January 26, 2019 through May 2021 discontinued based on The patient's request and had complete response at that time. He is  currently on observation and feeling fine with no concerning complaints.   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     The patient recently had a restaging CT scan performed.  The patient was seen with Dr. Arbutus Ped today.  Dr. Arbutus Ped personally independently reviewed the scan and discussed the results with the patient today.  The scan showed no evidence of disease progression.    We will see him back for follow-up visit in 6 months for evaluation repeat CT scan of the chest, abdomen, pelvis.  I will order it without contrast due to his prior nephrectomy.    The  patient's hematocrit is 52.5 and his hemoglobin is 17.9  we will arrange for phlebotomy today due to his polycythemia.  The patient was advised to call immediately if she has any concerning symptoms in the interval. The patient voices understanding of current disease status and treatment options and is in agreement with the current care plan. All questions were answered. The patient knows to call the clinic with any problems, questions or concerns. We can certainly see the patient much sooner if necessary    No orders of the defined types were placed in this encounter.    [name] Zinni L Maddoxx Burkitt, PA-C 06/17/23  ADDENDUM: Hematology/Oncology Attending:  I had a face-to-face encounter with the patient today.  I reviewed his record, lab, scan and recommended his care plan.  This is a very pleasant 78 years old white male with history of stage IV clear-cell renal cell carcinoma diagnosed in August 2020.  He also has a history of reactive polycythemia also diagnosed in 2010.  The patient was status post radical nephrectomy followed by systemic treatment with [facility] and axitinib discontinued in May 2021 secondary to complete response and patient preference.  He has been on observation since that time.  He had repeat CT scan of the chest, abdomen and pelvis  performed recently.  I personally independently reviewed the scan and discussed the result with the patient today.  His scan showed no concerning findings for disease progression. For the polycythemia, we will arrange for the patient to have phlebotomy done today. We will see him back for follow-up visit in 6 months for evaluation and repeat blood work. The patient was advised to call immediately if he has any concerning symptoms in the interval. The total time spent in the appointment was 30 minutes.  Disclaimer: This note was dictated with voice recognition software. Similar sounding words can inadvertently be transcribed and may be missed upon review. Lajuana Matte, MD

## 2023-06-23 ENCOUNTER — Ambulatory Visit

## 2023-06-23 ENCOUNTER — Inpatient Hospital Stay (HOSPITAL_BASED_OUTPATIENT_CLINIC_OR_DEPARTMENT_OTHER): Payer: Medicare Other | Admitting: Physician Assistant

## 2023-06-23 VITALS — BP 137/85 | HR 66 | Temp 98.2°F | Resp 18 | Wt 189.0 lb

## 2023-06-23 VITALS — BP 122/75 | HR 63 | Resp 18

## 2023-06-23 DIAGNOSIS — C649 Malignant neoplasm of unspecified kidney, except renal pelvis: Secondary | ICD-10-CM

## 2023-06-23 DIAGNOSIS — D751 Secondary polycythemia: Secondary | ICD-10-CM | POA: Diagnosis not present

## 2023-06-23 DIAGNOSIS — Z85528 Personal history of other malignant neoplasm of kidney: Secondary | ICD-10-CM | POA: Diagnosis not present

## 2023-06-23 DIAGNOSIS — Z9079 Acquired absence of other genital organ(s): Secondary | ICD-10-CM | POA: Diagnosis not present

## 2023-06-23 DIAGNOSIS — Z8546 Personal history of malignant neoplasm of prostate: Secondary | ICD-10-CM | POA: Diagnosis not present

## 2023-06-23 DIAGNOSIS — Z905 Acquired absence of kidney: Secondary | ICD-10-CM | POA: Diagnosis not present

## 2023-06-23 DIAGNOSIS — C641 Malignant neoplasm of right kidney, except renal pelvis: Secondary | ICD-10-CM

## 2023-06-23 NOTE — Progress Notes (Signed)
 [name] presents today for phlebotomy per MD orders. Phlebotomy procedure started at 1549 and ended at 1556. 496 grams removed using a 16g phlebotomy kit. Patient declined observation after procedure.  Patient tolerated procedure well. IV needle removed intact. VSS at discharge.

## 2023-06-24 ENCOUNTER — Telehealth: Payer: Self-pay | Admitting: Internal Medicine

## 2023-06-24 NOTE — Telephone Encounter (Signed)
 Scheduled appointments around a scan expected date. The patient is aware of the appointments made.

## 2023-10-18 DIAGNOSIS — L814 Other melanin hyperpigmentation: Secondary | ICD-10-CM | POA: Diagnosis not present

## 2023-10-18 DIAGNOSIS — L570 Actinic keratosis: Secondary | ICD-10-CM | POA: Diagnosis not present

## 2023-10-18 DIAGNOSIS — D225 Melanocytic nevi of trunk: Secondary | ICD-10-CM | POA: Diagnosis not present

## 2023-10-18 DIAGNOSIS — L821 Other seborrheic keratosis: Secondary | ICD-10-CM | POA: Diagnosis not present

## 2023-10-21 DIAGNOSIS — D45 Polycythemia vera: Secondary | ICD-10-CM | POA: Diagnosis not present

## 2023-10-21 DIAGNOSIS — N1832 Chronic kidney disease, stage 3b: Secondary | ICD-10-CM | POA: Diagnosis not present

## 2023-10-21 DIAGNOSIS — I700 Atherosclerosis of aorta: Secondary | ICD-10-CM | POA: Diagnosis not present

## 2023-10-21 DIAGNOSIS — I10 Essential (primary) hypertension: Secondary | ICD-10-CM | POA: Diagnosis not present

## 2023-10-21 DIAGNOSIS — E7800 Pure hypercholesterolemia, unspecified: Secondary | ICD-10-CM | POA: Diagnosis not present

## 2023-10-21 DIAGNOSIS — R7309 Other abnormal glucose: Secondary | ICD-10-CM | POA: Diagnosis not present

## 2023-10-21 DIAGNOSIS — Z8546 Personal history of malignant neoplasm of prostate: Secondary | ICD-10-CM | POA: Diagnosis not present

## 2023-10-28 DIAGNOSIS — M545 Low back pain, unspecified: Secondary | ICD-10-CM | POA: Diagnosis not present

## 2023-10-28 DIAGNOSIS — I700 Atherosclerosis of aorta: Secondary | ICD-10-CM | POA: Diagnosis not present

## 2023-10-28 DIAGNOSIS — N1832 Chronic kidney disease, stage 3b: Secondary | ICD-10-CM | POA: Diagnosis not present

## 2023-10-28 DIAGNOSIS — D447 Neoplasm of uncertain behavior of aortic body and other paraganglia: Secondary | ICD-10-CM | POA: Diagnosis not present

## 2023-10-28 DIAGNOSIS — Z0000 Encounter for general adult medical examination without abnormal findings: Secondary | ICD-10-CM | POA: Diagnosis not present

## 2023-10-28 DIAGNOSIS — H6120 Impacted cerumen, unspecified ear: Secondary | ICD-10-CM | POA: Diagnosis not present

## 2023-10-28 DIAGNOSIS — D45 Polycythemia vera: Secondary | ICD-10-CM | POA: Diagnosis not present

## 2023-10-28 DIAGNOSIS — M25532 Pain in left wrist: Secondary | ICD-10-CM | POA: Diagnosis not present

## 2023-10-28 DIAGNOSIS — C641 Malignant neoplasm of right kidney, except renal pelvis: Secondary | ICD-10-CM | POA: Diagnosis not present

## 2023-10-28 DIAGNOSIS — C799 Secondary malignant neoplasm of unspecified site: Secondary | ICD-10-CM | POA: Diagnosis not present

## 2023-10-28 DIAGNOSIS — I129 Hypertensive chronic kidney disease with stage 1 through stage 4 chronic kidney disease, or unspecified chronic kidney disease: Secondary | ICD-10-CM | POA: Diagnosis not present

## 2023-10-28 DIAGNOSIS — E7800 Pure hypercholesterolemia, unspecified: Secondary | ICD-10-CM | POA: Diagnosis not present

## 2023-10-29 ENCOUNTER — Telehealth: Payer: Self-pay | Admitting: Medical Oncology

## 2023-10-29 ENCOUNTER — Encounter: Payer: Self-pay | Admitting: Family Medicine

## 2023-10-29 ENCOUNTER — Telehealth: Payer: Self-pay | Admitting: Internal Medicine

## 2023-10-29 NOTE — Telephone Encounter (Signed)
 PCP concerned about  his polycythemia  . PCP faxed his recent CBC showing a hct=57.6 hgb =18.8 (labs drawn one week ago).   His last phlebotomy was  07/03/2023.  I called pt -he says  I feel pretty normal . Most of the time I can' tell if my cbc is abnormal.   F/u scheduled for October.  Schedule message sent to schedule a lab appointment  and phlebotomy for Monday.

## 2023-10-29 NOTE — Telephone Encounter (Signed)
 Pt confirmed appts for next week.

## 2023-11-01 ENCOUNTER — Other Ambulatory Visit: Payer: Self-pay | Admitting: Medical Oncology

## 2023-11-02 ENCOUNTER — Inpatient Hospital Stay: Attending: Physician Assistant

## 2023-11-02 ENCOUNTER — Inpatient Hospital Stay

## 2023-11-02 ENCOUNTER — Other Ambulatory Visit: Payer: Self-pay | Admitting: Medical Oncology

## 2023-11-02 VITALS — BP 109/77 | HR 56 | Temp 99.0°F | Resp 18

## 2023-11-02 DIAGNOSIS — D751 Secondary polycythemia: Secondary | ICD-10-CM | POA: Diagnosis not present

## 2023-11-02 DIAGNOSIS — Z85528 Personal history of other malignant neoplasm of kidney: Secondary | ICD-10-CM | POA: Insufficient documentation

## 2023-11-02 LAB — CMP (CANCER CENTER ONLY)
ALT: 10 U/L (ref 0–44)
AST: 15 U/L (ref 15–41)
Albumin: 4.3 g/dL (ref 3.5–5.0)
Alkaline Phosphatase: 51 U/L (ref 38–126)
Anion gap: 6 (ref 5–15)
BUN: 26 mg/dL — ABNORMAL HIGH (ref 8–23)
CO2: 29 mmol/L (ref 22–32)
Calcium: 9.3 mg/dL (ref 8.9–10.3)
Chloride: 107 mmol/L (ref 98–111)
Creatinine: 1.37 mg/dL — ABNORMAL HIGH (ref 0.61–1.24)
GFR, Estimated: 53 mL/min — ABNORMAL LOW (ref >60)
Glucose, Bld: 89 mg/dL (ref 70–99)
Potassium: 4.4 mmol/L (ref 3.5–5.1)
Sodium: 142 mmol/L (ref 135–145)
Total Bilirubin: 0.6 mg/dL (ref 0.0–1.2)
Total Protein: 6.9 g/dL (ref 6.5–8.1)

## 2023-11-02 LAB — CBC WITH DIFFERENTIAL (CANCER CENTER ONLY)
Abs Immature Granulocytes: 0.03 K/uL (ref 0.00–0.07)
Basophils Absolute: 0.1 K/uL (ref 0.0–0.1)
Basophils Relative: 1 %
Eosinophils Absolute: 0.2 K/uL (ref 0.0–0.5)
Eosinophils Relative: 4 %
HCT: 49.4 % (ref 39.0–52.0)
Hemoglobin: 17 g/dL (ref 13.0–17.0)
Immature Granulocytes: 1 %
Lymphocytes Relative: 27 %
Lymphs Abs: 1.6 K/uL (ref 0.7–4.0)
MCH: 28.6 pg (ref 26.0–34.0)
MCHC: 34.4 g/dL (ref 30.0–36.0)
MCV: 83.2 fL (ref 80.0–100.0)
Monocytes Absolute: 0.8 K/uL (ref 0.1–1.0)
Monocytes Relative: 13 %
Neutro Abs: 3.3 K/uL (ref 1.7–7.7)
Neutrophils Relative %: 54 %
Platelet Count: 216 K/uL (ref 150–400)
RBC: 5.94 MIL/uL — ABNORMAL HIGH (ref 4.22–5.81)
RDW: 13.3 % (ref 11.5–15.5)
WBC Count: 6 K/uL (ref 4.0–10.5)
nRBC: 0 % (ref 0.0–0.2)

## 2023-11-02 NOTE — Patient Instructions (Signed)

## 2023-11-02 NOTE — Progress Notes (Signed)
 [name] presents today for phlebotomy per MD orders. Phlebotomy procedure started at 1521 and ended at 1525. 527 grams removed. 16 ga phlebotomy kit used to the left [facility]. IV needle removed intact. Patient declined 30 minute post observation. Patient tolerated procedure well. VSS at discharge.

## 2023-11-02 NOTE — Progress Notes (Signed)
 Lab orders entered

## 2023-12-16 DIAGNOSIS — M24541 Contracture, right hand: Secondary | ICD-10-CM | POA: Diagnosis not present

## 2023-12-16 DIAGNOSIS — M720 Palmar fascial fibromatosis [Dupuytren]: Secondary | ICD-10-CM | POA: Diagnosis not present

## 2023-12-23 ENCOUNTER — Ambulatory Visit (HOSPITAL_COMMUNITY)
Admission: RE | Admit: 2023-12-23 | Discharge: 2023-12-23 | Disposition: A | Discharge status: Home / Self Care | Source: Ambulatory Visit | Attending: Physician Assistant | Admitting: Physician Assistant

## 2023-12-23 ENCOUNTER — Inpatient Hospital Stay: Attending: Physician Assistant

## 2023-12-23 DIAGNOSIS — Z8042 Family history of malignant neoplasm of prostate: Secondary | ICD-10-CM | POA: Insufficient documentation

## 2023-12-23 DIAGNOSIS — Z85528 Personal history of other malignant neoplasm of kidney: Secondary | ICD-10-CM | POA: Diagnosis not present

## 2023-12-23 DIAGNOSIS — Z905 Acquired absence of kidney: Secondary | ICD-10-CM | POA: Diagnosis not present

## 2023-12-23 DIAGNOSIS — Z9079 Acquired absence of other genital organ(s): Secondary | ICD-10-CM | POA: Diagnosis not present

## 2023-12-23 DIAGNOSIS — D751 Secondary polycythemia: Secondary | ICD-10-CM | POA: Insufficient documentation

## 2023-12-23 DIAGNOSIS — J432 Centrilobular emphysema: Secondary | ICD-10-CM | POA: Diagnosis not present

## 2023-12-23 DIAGNOSIS — C641 Malignant neoplasm of right kidney, except renal pelvis: Secondary | ICD-10-CM | POA: Diagnosis not present

## 2023-12-23 DIAGNOSIS — Z8052 Family history of malignant neoplasm of bladder: Secondary | ICD-10-CM | POA: Diagnosis not present

## 2023-12-23 DIAGNOSIS — C649 Malignant neoplasm of unspecified kidney, except renal pelvis: Secondary | ICD-10-CM

## 2023-12-23 DIAGNOSIS — I700 Atherosclerosis of aorta: Secondary | ICD-10-CM | POA: Diagnosis not present

## 2023-12-23 LAB — CBC WITH DIFFERENTIAL (CANCER CENTER ONLY)
Abs Immature Granulocytes: 0.03 K/uL (ref 0.00–0.07)
Basophils Absolute: 0.1 K/uL (ref 0.0–0.1)
Basophils Relative: 1 %
Eosinophils Absolute: 0.3 K/uL (ref 0.0–0.5)
Eosinophils Relative: 4 %
HCT: 51.6 % (ref 39.0–52.0)
Hemoglobin: 17.5 g/dL — ABNORMAL HIGH (ref 13.0–17.0)
Immature Granulocytes: 1 %
Lymphocytes Relative: 22 %
Lymphs Abs: 1.3 K/uL (ref 0.7–4.0)
MCH: 28.4 pg (ref 26.0–34.0)
MCHC: 33.9 g/dL (ref 30.0–36.0)
MCV: 83.8 fL (ref 80.0–100.0)
Monocytes Absolute: 0.6 K/uL (ref 0.1–1.0)
Monocytes Relative: 10 %
Neutro Abs: 3.6 K/uL (ref 1.7–7.7)
Neutrophils Relative %: 62 %
Platelet Count: 198 K/uL (ref 150–400)
RBC: 6.16 MIL/uL — ABNORMAL HIGH (ref 4.22–5.81)
RDW: 13 % (ref 11.5–15.5)
WBC Count: 5.8 K/uL (ref 4.0–10.5)
nRBC: 0 % (ref 0.0–0.2)

## 2023-12-23 LAB — CMP (CANCER CENTER ONLY)
ALT: 11 U/L (ref 0–44)
AST: 16 U/L (ref 15–41)
Albumin: 4.3 g/dL (ref 3.5–5.0)
Alkaline Phosphatase: 62 U/L (ref 38–126)
Anion gap: 5 (ref 5–15)
BUN: 19 mg/dL (ref 8–23)
CO2: 31 mmol/L (ref 22–32)
Calcium: 9.8 mg/dL (ref 8.9–10.3)
Chloride: 106 mmol/L (ref 98–111)
Creatinine: 1.29 mg/dL — ABNORMAL HIGH (ref 0.61–1.24)
GFR, Estimated: 57 mL/min — ABNORMAL LOW (ref >60)
Glucose, Bld: 109 mg/dL — ABNORMAL HIGH (ref 70–99)
Potassium: 4 mmol/L (ref 3.5–5.1)
Sodium: 142 mmol/L (ref 135–145)
Total Bilirubin: 0.6 mg/dL (ref 0.0–1.2)
Total Protein: 7.4 g/dL (ref 6.5–8.1)

## 2023-12-30 ENCOUNTER — Ambulatory Visit: Admitting: Internal Medicine

## 2023-12-30 ENCOUNTER — Inpatient Hospital Stay

## 2023-12-30 ENCOUNTER — Inpatient Hospital Stay: Admitting: Internal Medicine

## 2023-12-30 VITALS — BP 126/80 | HR 57 | Temp 98.2°F | Resp 17 | Ht 69.0 in | Wt 187.0 lb

## 2023-12-30 DIAGNOSIS — Z8042 Family history of malignant neoplasm of prostate: Secondary | ICD-10-CM | POA: Diagnosis not present

## 2023-12-30 DIAGNOSIS — Z905 Acquired absence of kidney: Secondary | ICD-10-CM | POA: Diagnosis not present

## 2023-12-30 DIAGNOSIS — Z9079 Acquired absence of other genital organ(s): Secondary | ICD-10-CM | POA: Diagnosis not present

## 2023-12-30 DIAGNOSIS — Z85528 Personal history of other malignant neoplasm of kidney: Secondary | ICD-10-CM | POA: Diagnosis not present

## 2023-12-30 DIAGNOSIS — Z8052 Family history of malignant neoplasm of bladder: Secondary | ICD-10-CM | POA: Diagnosis not present

## 2023-12-30 DIAGNOSIS — C3490 Malignant neoplasm of unspecified part of unspecified bronchus or lung: Secondary | ICD-10-CM | POA: Diagnosis not present

## 2023-12-30 DIAGNOSIS — D751 Secondary polycythemia: Secondary | ICD-10-CM | POA: Diagnosis not present

## 2023-12-30 NOTE — Progress Notes (Signed)
 [facility] [phone]   [phone]  OFFICE PROGRESS NOTE  [name], DO [address] 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 2) reactive polycythemia diagnosed in 2010.  PRIOR THERAPY: 1) status post radical nephrectomy in April 2020 and the final pathology showed clear-cell histology invading into the perineural fat indicating stage IIIa with negative tumor margin. 2) status post orchiectomy completed October 29, 2018 and the final pathology showed metastatic carcinoma consistent with clear-cell histology. 3) status post treatment with Keytruda  200 Mg IV every 3 weeks in addition to axitinib  5 mg p.o. daily started January 26, 2019.  Treatment discontinued in May 2021 based on the patient's preference and complete response.  Status post 11 cycles.  CURRENT THERAPY: Observation and intermittent phlebotomy for the polycythemia.  INTERVAL HISTORY: [name] 78 y.o. male returns to the clinic today for follow-up visit.Discussed the use of AI scribe software for clinical note transcription with the patient, who gave verbal consent to proceed.  History of Present Illness [name] is a 78 year old male with stage four clear cell renal cell carcinoma who presents for restaging with a repeat CT scan of the chest, abdomen, and pelvis.  He was diagnosed with stage four clear cell renal cell carcinoma in August 2020 and underwent a radical nephrectomy. He was treated with immunotherapy, including Keytruda  and axitinib , which was discontinued in May 2021 after achieving a complete response. Since then, he has been on observation, and his condition has remained stable over the past six months.  He has a history of reactive polycythemia, which  previously required frequent phlebotomies. His family doctor recently suggested a phlebotomy due to elevated red blood cells, but he has not yet scheduled it. His hemoglobin level is 17.5.  He has undergone multiple surgeries for back and neck issues, which continue to cause him some difficulty. No breathing difficulties, chest pain, or hemoptysis. He reports occasional hemorrhoidal bleeding but no other blood in his urine or stool.  His serum creatinine level is 1.29.    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  MEDICATIONS:  Current Outpatient Medications  Medication Sig Dispense Refill   acetaZOLAMIDE (DIAMOX) 250 MG tablet Take 250 mg by mouth every 12 (twelve) hours.     aspirin EC 81 MG tablet Take 81 mg by mouth daily. Swallow whole. (Patient not taking: Reported on 01/23/2023)     busPIRone  (BUSPAR ) 5 MG tablet Take 5 mg by mouth 2 (two) times daily as needed (for anxiety).     Carboxymethylcellulose Sodium (THERATEARS OP) Place 1 drop into both eyes 3 (three) times daily as needed (for dryness).     docusate sodium  (COLACE) 100  MG capsule Take 100-200 mg by mouth daily as needed for mild constipation.     fluticasone  (FLONASE ) 50 MCG/ACT nasal spray Place 1 spray into both nostrils daily as needed for allergies.      HYDROmorphone  (DILAUDID ) 2 MG tablet Take 2 mg by mouth every 4 (four) hours as needed (for pain- wean as directed).     losartan -hydrochlorothiazide  (HYZAAR ) 100-25 MG  tablet Take 1 tablet by mouth daily.     methocarbamol  (ROBAXIN ) 750 MG tablet Take 750 mg by mouth 3 (three) times daily as needed for muscle spasms.     ondansetron  (ZOFRAN -ODT) 4 MG disintegrating tablet Take 4 mg by mouth every 8 (eight) hours as needed for nausea or vomiting (dissolve orally).     PEPCID  AC MAXIMUM STRENGTH 20 MG tablet Take 20 mg by mouth daily as needed for heartburn or indigestion.     pravastatin  (PRAVACHOL ) 40 MG tablet Take 40 mg by mouth daily.     pregabalin (LYRICA) 75 MG capsule Take 75 mg by mouth 2 (two) times daily.     TYLENOL  500 MG tablet Take 1,000 mg by mouth every 6 (six) hours as needed for mild pain (pain score 1-3) or headache.     No current facility-administered medications for this visit.    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   Procedure: EXCISION LEFT DUPUYTRENS RING AND SMALL FINGERS;  Surgeon: Lamar LULLA Leonor Mickey., MD;  Location: [facility];  Service: Orthopedics;  Laterality: Left;   ESOPHAGOGASTRODUODENOSCOPY (EGD) WITH PROPOFOL  N/A 05/12/2018   Procedure: ESOPHAGOGASTRODUODENOSCOPY (EGD) WITH PROPOFOL ;  Surgeon: Celestia Agent, MD;  Location: [facility];  Service: Endoscopy;  Laterality: N/A;   HERNIA REPAIR Left 2010   lt ing with lt hydrocele- inguinal   IR IMAGING GUIDED PORT INSERTION  01/20/2019   IR REMOVAL TUN ACCESS W/ PORT W/O FL MOD SED  11/21/2019   ORCHIECTOMY Right 10/29/2018   Procedure: ORCHIECTOMY radical;  Surgeon: Alvaro Hummer, MD;  Location: [facility];  Service: Urology;  Laterality: Right;  75 MINS   POSTERIOR CERVICAL FUSION/FORAMINOTOMY Right 08/26/2022   Procedure: CERVICAL SEVEN-THORACIC ONE LAMINOTOMY AND FACETECTOMY FOR RESECTION OF INTRADURAL TUMOR WITH FUSION;  Surgeon: Lanis Pupa, MD;  Location: [facility];  Service: Neurosurgery;  Laterality: Right;  3C   RADIOACTIVE SEED IMPLANT  2006   hx of  prostate   ROBOT ASSISTED LAPAROSCOPIC NEPHRECTOMY Right 06/22/2018   Procedure: XI ROBOTIC ASSISTED LAPAROSCOPIC NEPHRECTOMY;  Surgeon: Alvaro Hummer, MD;  Location: [facility];  Service: Urology;  Laterality: Right;  3 HRS   SHOULDER ARTHROSCOPY  01/2012   right    REVIEW OF SYSTEMS:  Constitutional: positive for fatigue Eyes: negative Ears, nose, mouth, throat, and face: negative Respiratory: negative Cardiovascular: negative Gastrointestinal: negative Genitourinary:negative Integument/breast: negative Hematologic/lymphatic: negative Musculoskeletal:positive for back pain Neurological: negative Behavioral/Psych: negative Endocrine: negative Allergic/Immunologic: negative   PHYSICAL EXAMINATION: General appearance: alert, cooperative, fatigued, and no distress Head: Normocephalic, without obvious abnormality, atraumatic Neck: no adenopathy, no JVD, supple, symmetrical, trachea midline, and thyroid  not enlarged, symmetric, no tenderness/mass/nodules Lymph nodes: Cervical, supraclavicular, and axillary nodes normal. Resp: clear to auscultation bilaterally Back: symmetric, no curvature. ROM normal. No CVA tenderness. Cardio: regular rate and rhythm, S1, S2 normal, no murmur, click, rub or gallop GI: soft, non-tender; bowel sounds normal; no masses,  no organomegaly Extremities: extremities normal, atraumatic, no cyanosis or edema Neurologic: Alert and  oriented X 3, normal strength and tone. Normal symmetric reflexes. Normal coordination and gait  ECOG PERFORMANCE STATUS: 1 - Symptomatic but completely ambulatory  Blood pressure 126/80, pulse (!) 57, temperature 98.2 F (36.8 C), resp. rate 17, height 5' 9 (1.753 m), weight 187 lb (84.8 kg), SpO2 96%.  LABORATORY DATA: Lab Results  Component Value Date   WBC 5.8 12/23/2023   HGB 17.5 (H) 12/23/2023   HCT 51.6 12/23/2023   MCV 83.8 12/23/2023   PLT 198 12/23/2023      Chemistry      Component Value Date/Time   NA 142  12/23/2023 0807   NA 141 01/18/2015 0835   K 4.0 12/23/2023 0807   K 4.1 01/18/2015 0835   CL 106 12/23/2023 0807   CO2 31 12/23/2023 0807   CO2 25 01/18/2015 0835   BUN 19 12/23/2023 0807   BUN 21.5 01/18/2015 0835   CREATININE 1.29 (H) 12/23/2023 0807   CREATININE 1.1 01/18/2015 0835      Component Value Date/Time   CALCIUM 9.8 12/23/2023 0807   CALCIUM 9.8 01/18/2015 0835   ALKPHOS 62 12/23/2023 0807   ALKPHOS 66 01/18/2015 0835   AST 16 12/23/2023 0807   AST 15 01/18/2015 0835   ALT 11 12/23/2023 0807   ALT 16 01/18/2015 0835   BILITOT 0.6 12/23/2023 0807   BILITOT 0.91 01/18/2015 0835       RADIOGRAPHIC STUDIES: CT CHEST ABDOMEN PELVIS WO CONTRAST Result Date: 12/25/2023 CLINICAL DATA:  Metastatic disease evaluation, metastatic clear cell renal cell carcinoma * Tracking Code: BO * EXAM: CT CHEST, ABDOMEN AND PELVIS WITHOUT CONTRAST TECHNIQUE: Multidetector CT imaging of the chest, abdomen and pelvis was performed following the standard protocol without IV contrast. RADIATION DOSE REDUCTION: This exam was performed according to the departmental dose-optimization program which includes automated exposure control, adjustment of the mA and/or kV according to patient size and/or use of iterative reconstruction technique. COMPARISON:  06/17/2023 FINDINGS: CT CHEST FINDINGS Cardiovascular: Aortic atherosclerosis. Normal heart size. Left and right coronary artery calcifications. No pericardial effusion. Mediastinum/Nodes: No enlarged mediastinal, hilar, or axillary lymph nodes. Thyroid  gland, trachea, and esophagus demonstrate no significant findings. Lungs/Pleura: Mild centrilobular and paraseptal emphysema. Bandlike scarring of the dependent bilateral lung bases. Unchanged 0.3 cm nodule of the superior segment left lower lobe (series 6, image 96). No pleural effusion or pneumothorax. Musculoskeletal: No chest wall abnormality. No acute osseous findings. CT ABDOMEN PELVIS FINDINGS  Hepatobiliary: No solid liver abnormality is seen. No gallstones, gallbladder wall thickening, or biliary dilatation. Pancreas: Unremarkable. No pancreatic ductal dilatation or surrounding inflammatory changes. Spleen: Normal in size without significant abnormality. Adrenals/Urinary Tract: Adrenal glands are unremarkable. Right nephrectomy. No noncontrast evidence of soft tissue in the nephrectomy bed. Left renal cortical cysts, benign, requiring no further follow-up or characterization. No calculi or hydronephrosis. Bladder is unremarkable. Stomach/Bowel: Stomach is within normal limits. Appendectomy. No evidence of bowel wall thickening, distention, or inflammatory changes. Descending and sigmoid diverticulosis. Vascular/Lymphatic: Aortic atherosclerosis. No enlarged abdominal or pelvic lymph nodes. Reproductive: Prostate brachytherapy. Other: Small fat containing bilateral inguinal hernias.  No ascites. Musculoskeletal: No acute osseous findings. IMPRESSION: 1. Right nephrectomy. No noncontrast evidence of soft tissue in the nephrectomy bed. 2. No noncontrast evidence of lymphadenopathy or metastatic disease in the chest, abdomen, or pelvis. 3. Unchanged 0.3 cm nodule of the superior segment left lower lobe, almost certainly benign and incidental. Attention on follow-up. 4. Prostate brachytherapy. 5. Emphysema. 6. Coronary artery disease. Aortic Atherosclerosis (ICD10-I70.0) and Emphysema (ICD10-J43.9).  Electronically Signed   By: Marolyn JONETTA Jaksch M.D.   On: 12/25/2023 21:33    ASSESSMENT AND PLAN: This is a very pleasant 78 years old white male with history of a stage IV clear-cell renal cell carcinoma initially diagnosed as a stage III in April 2020 with evidence of spermatic cord involvement in August 2020.  He is status post right radical nephrectomy in April 2020 and the final pathology showed clear-cell renal cell carcinoma invading into the perineural fat indicating stage T3a with negative margin.  Status  post orchiectomy in August 2020 and the final pathology showed metastatic carcinoma consistent with clear-cell histology. He is status post 11 cycle of treatment with Keytruda  and axitinib  between January 26, 2019 through May 2021 discontinued based on The patient's request and had complete response at that time. He is currently on observation and feeling fine with no concerning complaints. The patient had repeat CT scan of the chest, abdomen and pelvis performed recently.  I personally independently reviewed the scan and discussed the result with the patient today.  His scan showed no concerning findings for disease recurrence or metastasis. Assessment and Plan Assessment & Plan Stage 4 clear cell renal cell carcinoma, status post nephrectomy Stage 4 clear cell renal cell carcinoma, initially diagnosed in August 2020. Status post radical nephrectomy and treatment with Keytruda  and axitinib , which was discontinued in May 2021 after achieving complete response. Currently on observation with no evidence of disease progression on recent CT scan of the chest, abdomen, and pelvis. The disease remains under control. - Continue observation with follow-up every six months - Order CT scan of the chest, abdomen, and pelvis prior to next visit  Reactive polycythemia Reactive polycythemia with elevated hemoglobin level of 17.5. Not polycythemia vera. Previously required frequent phlebotomies, but frequency has decreased over time. Current management involves monitoring and phlebotomy as needed. - Arrange phlebotomy if possible during current visit - If not possible, arrange phlebotomy at a later date He was advised to call immediately if he has any other concerning symptoms in the interval.  The patient voices understanding of current disease status and treatment options and is in agreement with the current care plan.  All questions were answered. The patient knows to call the clinic with any problems,  questions or concerns. We can certainly see the patient much sooner if necessary.  The total time spent in the appointment was 30 minutes.  Disclaimer: This note was dictated with voice recognition software. Similar sounding words can inadvertently be transcribed and may not be corrected upon review.

## 2023-12-30 NOTE — Progress Notes (Signed)
 [name] presents today for phlebotomy per MD orders. Phlebotomy procedure started at 1520 and ended at 93. 16GA phlebotomy kit used to the L AC. IV needle removed intact. 526 grams removed. Patient declined 30 minute observation following procedure. Patient declined any snacks or beverages. Patient tolerated procedure well.

## 2023-12-30 NOTE — Patient Instructions (Signed)

## 2023-12-31 ENCOUNTER — Telehealth: Payer: Self-pay | Admitting: Internal Medicine

## 2023-12-31 NOTE — Telephone Encounter (Signed)
Scheduled appointments with the patient

## 2024-01-31 DIAGNOSIS — M720 Palmar fascial fibromatosis [Dupuytren]: Secondary | ICD-10-CM | POA: Diagnosis not present

## 2024-02-17 DIAGNOSIS — L538 Other specified erythematous conditions: Secondary | ICD-10-CM | POA: Diagnosis not present

## 2024-02-17 DIAGNOSIS — L814 Other melanin hyperpigmentation: Secondary | ICD-10-CM | POA: Diagnosis not present

## 2024-02-17 DIAGNOSIS — D225 Melanocytic nevi of trunk: Secondary | ICD-10-CM | POA: Diagnosis not present

## 2024-02-17 DIAGNOSIS — L821 Other seborrheic keratosis: Secondary | ICD-10-CM | POA: Diagnosis not present

## 2024-02-17 DIAGNOSIS — D492 Neoplasm of unspecified behavior of bone, soft tissue, and skin: Secondary | ICD-10-CM | POA: Diagnosis not present

## 2024-02-17 DIAGNOSIS — L570 Actinic keratosis: Secondary | ICD-10-CM | POA: Diagnosis not present

## 2024-02-17 DIAGNOSIS — L578 Other skin changes due to chronic exposure to nonionizing radiation: Secondary | ICD-10-CM | POA: Diagnosis not present

## 2024-03-08 ENCOUNTER — Encounter: Payer: Self-pay | Admitting: *Deleted

## 2024-03-28 ENCOUNTER — Ambulatory Visit (HOSPITAL_COMMUNITY)
Admission: RE | Admit: 2024-03-28 | Discharge: 2024-03-28 | Disposition: A | Discharge status: Home / Self Care | Source: Ambulatory Visit | Attending: Internal Medicine | Admitting: Internal Medicine

## 2024-03-28 DIAGNOSIS — Z981 Arthrodesis status: Secondary | ICD-10-CM

## 2024-03-28 DIAGNOSIS — R200 Anesthesia of skin: Secondary | ICD-10-CM | POA: Diagnosis not present

## 2024-03-28 DIAGNOSIS — D447 Neoplasm of uncertain behavior of aortic body and other paraganglia: Secondary | ICD-10-CM | POA: Diagnosis present

## 2024-03-28 DIAGNOSIS — M542 Cervicalgia: Secondary | ICD-10-CM

## 2024-03-28 MED ORDER — GADOBUTROL 1 MMOL/ML IV SOLN
8.0000 mL | Freq: Once | INTRAVENOUS | Status: AC | PRN
  Administered 2024-03-28: 8 mL via INTRAVENOUS

## 2024-04-06 ENCOUNTER — Inpatient Hospital Stay: Payer: Medicare Other | Attending: Physician Assistant | Admitting: Internal Medicine

## 2024-04-06 VITALS — BP 157/78 | HR 58 | Temp 97.7°F | Resp 20 | Wt 195.0 lb

## 2024-04-06 DIAGNOSIS — Z8042 Family history of malignant neoplasm of prostate: Secondary | ICD-10-CM | POA: Insufficient documentation

## 2024-04-06 DIAGNOSIS — Z8052 Family history of malignant neoplasm of bladder: Secondary | ICD-10-CM | POA: Insufficient documentation

## 2024-04-06 DIAGNOSIS — Z79899 Other long term (current) drug therapy: Secondary | ICD-10-CM | POA: Insufficient documentation

## 2024-04-06 DIAGNOSIS — D751 Secondary polycythemia: Secondary | ICD-10-CM | POA: Diagnosis present

## 2024-04-06 DIAGNOSIS — Z7982 Long term (current) use of aspirin: Secondary | ICD-10-CM | POA: Insufficient documentation

## 2024-04-06 DIAGNOSIS — Z85528 Personal history of other malignant neoplasm of kidney: Secondary | ICD-10-CM | POA: Diagnosis present

## 2024-04-06 DIAGNOSIS — Z87891 Personal history of nicotine dependence: Secondary | ICD-10-CM | POA: Insufficient documentation

## 2024-04-06 DIAGNOSIS — D447 Neoplasm of uncertain behavior of aortic body and other paraganglia: Secondary | ICD-10-CM | POA: Insufficient documentation

## 2024-04-06 DIAGNOSIS — Z806 Family history of leukemia: Secondary | ICD-10-CM | POA: Diagnosis not present

## 2024-04-06 NOTE — Progress Notes (Signed)
 "  [facility] [address]  [place], [place] [address] [phone]   Interval Evaluation  Date of Service: 04/06/24 Patient Name: [name] Patient MRN: [id number] Patient DOB: Feb 13, 1946 Provider: Arthea MARLA Manns, MD  Identifying Statement:  [name] is a 79 y.o. 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  The report date is 09/30/2022.  The CancerNext-Expanded gene panel offered by [facility] and includes sequencing and rearrangement analysis for the following 77 genes: AIP, ALK, APC*, ATM*, AXIN2, BAP1, BARD1, BMPR1A, BRCA1*, BRCA2*, BRIP1*, CDC73, CDH1*, CDK4, CDKN1B, CDKN2A, CHEK2*, CTNNA1, DICER1, FH, FLCN, KIF1B, LZTR1, MAX, MEN1, MET, MLH1*, MSH2*, MSH3, MSH6*, MUTYH*, NF1*, NF2, NTHL1, PALB2*, PHOX2B, PMS2*, POT1, PRKAR1A, PTCH1, PTEN*, RAD51C*, RAD51D*, RB1, RET, SDHA, SDHAF2, SDHB, SDHC, SDHD, SMAD4, SMARCA4, SMARCB1, SMARCE1, STK11, SUFU, TMEM127, TP53*, TSC1, TSC2, and VHL (sequencing and deletion/duplication); EGFR, EGLN1, HOXB13, KIT, MITF, PDGFRA, POLD1, and POLE (sequencing only); EPCAM and GREM1 (deletion/duplication only). DNA and RNA analyses performed for * genes.     CNS Oncologic History 08/26/22: Laminectomy, resection of right C7 root mass (Nundkumar); path c/w paraganglioma 01/30/23: Repair of CSF leak at [facility], followed by reactive  meninigitis, ICU course    Interval History: [name] presents today for follow up after recent MRI brain study.  He feels stable/unchanged with regards to his neck and back pain.  He still is hardly needing any pain medication.  Remains with numbness in the little fingers of his right hand, as prior.  Does have some headaches, with pain in the back of his head if turned in a certain way.  Overall neck mobility remains very decreased.  Chronic double vision is not improved, he is being fitted for prism  lenses through his eye doctor.  H+P (09/29/22) Patient presents for follow up after resection of cervical paraganglioma with Dr. Lanis.  He describes several months of severe pain in his right shoulder and arm, as well as weakness in his right hand.  Following 2 unsuccessful laminectomies, he underwent contrast enhanced MRI study which demonstrated dumbell shaped mass along nerve root at C7 on the right.  He underwent resection of this on 08/26/22; following surgery he had dramatic improvement in both pain and weakness.  Presently he has post-surgical discomfort only.  No neurologic complaints.      Medications: Current Outpatient Medications on File Prior to Visit  Medication Sig Dispense Refill   aspirin EC 81 MG tablet Take 81 mg by mouth daily. Swallow whole.     Carboxymethylcellulose Sodium (THERATEARS OP) Place 1 drop into both eyes 3 (three) times daily as needed (for dryness).     fluticasone  (FLONASE ) 50 MCG/ACT nasal spray Place 1 spray into both nostrils daily as needed for allergies.      losartan -hydrochlorothiazide  (HYZAAR) 100-25 MG tablet Take 1 tablet by mouth daily.     PEPCID  AC MAXIMUM STRENGTH 20 MG tablet Take 20 mg by mouth  daily as needed for heartburn or indigestion.     pravastatin  (PRAVACHOL ) 40 MG tablet Take 40 mg by mouth daily.     TYLENOL  500 MG tablet Take 1,000 mg by mouth every 6 (six) hours as needed for mild pain (pain score 1-3) or headache.     No  current facility-administered medications on file prior to visit.    Allergies:  Allergies  Allergen Reactions   Oxycodone  Other (See Comments)    Patient stated,it keeps me wide awake and I can't sleep.  Pt has taken this medication recently mixed with acetaminophen  and that works fine   Septra [Sulfamethoxazole-Trimethoprim] Hives and Itching   Past Medical History:  Past Medical History:  Diagnosis Date   Anxiety    Arthritis    Diverticulitis    Emphysema lung (HCC)    Family history of bladder cancer    Family history of prostate cancer    Hypertension    Inguinal hernia    Metastatic renal cell carcinoma (HCC) dx'd 2020   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   Polycythemia    phlebotomy 06/15/13   Prostate cancer (HCC) dx'd 2008   seed implant   Wears glasses    Wears hearing aid    both ears   Past Surgical History:  Past Surgical History:  Procedure Laterality Date   COLONOSCOPY     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   Procedure: EXCISION LEFT DUPUYTRENS RING AND SMALL FINGERS;  Surgeon: Lamar LULLA Leonor Mickey., MD;  Location: [facility];  Service: Orthopedics;  Laterality: Left;   ESOPHAGOGASTRODUODENOSCOPY (EGD) WITH PROPOFOL  N/A 05/12/2018   Procedure: ESOPHAGOGASTRODUODENOSCOPY (EGD) WITH PROPOFOL ;  Surgeon: Celestia Agent, MD;  Location: [facility];  Service: Endoscopy;  Laterality: N/A;   HERNIA REPAIR Left 2010   lt ing with lt hydrocele- inguinal   IR IMAGING GUIDED PORT INSERTION  01/20/2019   IR REMOVAL TUN ACCESS W/ PORT W/O FL MOD SED  11/21/2019   ORCHIECTOMY Right 10/29/2018   Procedure: ORCHIECTOMY radical;  Surgeon: Alvaro Hummer, MD;  Location: [facility];  Service: Urology;  Laterality: Right;  75 MINS   POSTERIOR CERVICAL FUSION/FORAMINOTOMY Right 08/26/2022   Procedure: CERVICAL SEVEN-THORACIC ONE LAMINOTOMY AND FACETECTOMY FOR RESECTION OF INTRADURAL TUMOR WITH FUSION;  Surgeon: Lanis Pupa, MD;  Location: [facility];   Service: Neurosurgery;  Laterality: Right;  3C   RADIOACTIVE SEED IMPLANT  2006   hx of prostate   ROBOT ASSISTED LAPAROSCOPIC NEPHRECTOMY Right 06/22/2018   Procedure: XI ROBOTIC ASSISTED LAPAROSCOPIC NEPHRECTOMY;  Surgeon: Alvaro Hummer, MD;  Location: [facility];  Service: Urology;  Laterality: Right;  3 HRS   SHOULDER ARTHROSCOPY  01/2012   right   Social History:  Social History   Socioeconomic History   Marital status: Married    Spouse name: Not on file   Number of children: 1   Years of education: Not on file   Highest education level: Not on file  Occupational History   Not on file  Tobacco Use   Smoking status: Former    Current packs/day: 0.00    Types: Cigarettes    Quit date: 06/15/1980    Years since quitting: 43.8   Smokeless tobacco: Never  Vaping Use   Vaping status: Never Used  Substance and Sexual Activity   Alcohol  use: Never   Drug use: No   Sexual activity: Not on file  Other Topics Concern   Not on file  Social History Narrative   Not on file  Social Drivers of Health   Tobacco Use: Medium Risk (03/14/2024)   Received from [facility]   Patient History    Smoking Tobacco Use: Former    Smokeless Tobacco Use: Never    Passive Exposure: Not on file  Financial Resource Strain: Low Risk  (02/03/2023)   Received from [facility]   Overall Financial Resource Strain (CARDIA)    Difficulty of Paying Living Expenses: Not hard at all  Food Insecurity: No Food Insecurity (02/03/2023)   Received from [facility]   Epic    Within the past 12 months, you worried that your food would run out before you got the money to buy more.: Never true    Within the past 12 months, the food you bought just didn't last and you didn't have money to get more.: Never true  Transportation Needs: No Transportation Needs (02/03/2023)   Received from [facility] - Transportation    In the past 12 months, has  lack of transportation kept you from medical appointments or from getting medications?: No    Lack of Transportation (Non-Medical): No  Physical Activity: Not on file  Stress: Not on file  Social Connections: Not on file  Intimate Partner Violence: Not At Risk (08/26/2022)   Humiliation, Afraid, Rape, and Kick questionnaire    Fear of Current or Ex-Partner: No    Emotionally Abused: No    Physically Abused: No    Sexually Abused: No  Depression (PHQ2-9): Low Risk (04/06/2024)   Depression (PHQ2-9)    PHQ-2 Score: 0  Alcohol  Screen: Not on file  Housing: Low Risk  (04/02/2023)   Received from [facility]   Epic    In the last 12 months, was there a time when you were not able to pay the mortgage or rent on time?: No    In the past 12 months, how many times have you moved where you were living?: 0    At any time in the past 12 months, were you homeless or living in a shelter (including now)?: No  Utilities: Not At Risk (12/19/2022)   Received from [facility] Utilities    Threatened with loss of utilities: No  Health Literacy: Not on file   Family History:  Family History  Problem Relation Age of Onset   Heart attack Father    Hypertension Father    Pneumonia Sister    Stroke Brother    Leukemia Maternal Aunt    Stroke Maternal Aunt    Prostate cancer Maternal Uncle 72   Bladder Cancer Paternal Uncle 60   Heart attack Maternal Grandfather    Cancer Paternal Grandfather        NOS   Healthy Son     Review of Systems: Constitutional: Doesn't report fevers, chills or abnormal weight loss Eyes: Doesn't report blurriness of vision Ears, nose, mouth, throat, and face: Doesn't report sore throat Respiratory: Doesn't report cough, dyspnea or wheezes Cardiovascular: Doesn't report palpitation, chest discomfort  Gastrointestinal:  Doesn't report nausea, constipation, diarrhea GU: Doesn't report incontinence Skin: Doesn't report skin  rashes Neurological: Per HPI Musculoskeletal: Doesn't report joint pain Behavioral/Psych: Doesn't report anxiety  Physical Exam: Vitals:   04/06/24 0905 04/06/24 0914  BP: (!) 167/84 (!) 157/78  Pulse: (!) 58   Resp: 20   Temp: 97.7 F (36.5 C)   SpO2: 98%    KPS: 90. General: Alert, cooperative, pleasant,  in no acute distress Head: Normal EENT: No conjunctival injection or scleral icterus.  Lungs: Resp effort normal Cardiac: Regular rate Abdomen: Non-distended abdomen Skin: No rashes cyanosis or petechiae. Extremities: No clubbing or edema  Neurologic Exam: Mental Status: Awake, alert, attentive to examiner. Oriented to self and environment. Language is fluent with intact comprehension.  Cranial Nerves: Visual acuity is grossly normal. Visual fields are full. Extra-ocular movements intact. No ptosis. Face is symmetric Motor: Tone and bulk are normal. Power is full in both arms and legs. Reflexes are symmetric, no pathologic reflexes present.  Sensory: Intact to light touch Gait: Normal.   Labs: I have reviewed the data as listed    Component Value Date/Time   NA 142 12/23/2023 0807   NA 141 01/18/2015 0835   K 4.0 12/23/2023 0807   K 4.1 01/18/2015 0835   CL 106 12/23/2023 0807   CO2 31 12/23/2023 0807   CO2 25 01/18/2015 0835   GLUCOSE 109 (H) 12/23/2023 0807   GLUCOSE 111 01/18/2015 0835   BUN 19 12/23/2023 0807   BUN 21.5 01/18/2015 0835   CREATININE 1.29 (H) 12/23/2023 0807   CREATININE 1.1 01/18/2015 0835   CALCIUM 9.8 12/23/2023 0807   CALCIUM 9.8 01/18/2015 0835   PROT 7.4 12/23/2023 0807   PROT 7.1 01/18/2015 0835   ALBUMIN  4.3 12/23/2023 0807   ALBUMIN  3.8 01/18/2015 0835   AST 16 12/23/2023 0807   AST 15 01/18/2015 0835   ALT 11 12/23/2023 0807   ALT 16 01/18/2015 0835   ALKPHOS 62 12/23/2023 0807   ALKPHOS 66 01/18/2015 0835   BILITOT 0.6 12/23/2023 0807   BILITOT 0.91 01/18/2015 0835   GFRNONAA 57 (L) 12/23/2023 0807   GFRAA 50 (L)  11/21/2019 0832   GFRAA 56 (L) 10/16/2019 0819   Lab Results  Component Value Date   WBC 5.8 12/23/2023   NEUTROABS 3.6 12/23/2023   HGB 17.5 (H) 12/23/2023   HCT 51.6 12/23/2023   MCV 83.8 12/23/2023   PLT 198 12/23/2023    Imaging:  MR CERVICAL SPINE W WO CONTRAST Result Date: 03/28/2024 CLINICAL DATA:  Neck pain and stiffness, numbness in right hand and fingers EXAM: MRI CERVICAL SPINE WITHOUT AND WITH CONTRAST TECHNIQUE: Multiplanar and multiecho pulse sequences of the cervical spine, to include the craniocervical junction and cervicothoracic junction, were obtained without and with intravenous contrast. CONTRAST:  8mL GADAVIST  GADOBUTROL  1 MMOL/ML IV SOLN COMPARISON:  01/23/2023 FINDINGS: The craniocervical junction is normal. There is no significant bone marrow signal abnormality. The cervical spinal cord is normal. C2-C3: There is mild degenerative disc disease with a mild disc bulge. No significant facet disease. No spinal stenosis or foraminal stenosis C3-C4: Previous anterior and posterior fusion. No spinal stenosis or foraminal stenosis C4-C5: Previous anterior and posterior fusion. No spinal stenosis or foraminal stenosis C5-C6: Previous anterior fusion. No spinal stenosis or foraminal stenosis C6-C7: Previous anterior fusion. No spinal stenosis or foraminal stenosis C7-T1: There is posterior fusion. No spinal stenosis or foraminal stenosis IMPRESSION: 1. Anterior arthrodesis at C3-C7 2. Posterior instrumentation at C3-T1 3. There is no spinal stenosis or foraminal stenosis 4. Postoperative fluid collections have nearly completely resolved. Electronically Signed   By: Nancyann Burns M.D.   On: 03/28/2024 09:30   Assessment/Plan Paraganglioma [facility]) - Plan: MR CERVICAL SPINE W WO CONTRAST  [name] is clinically stable today.  MRI cervical spine demonstrates stable findings without tumor recurrence or regrowth.   He will continue to follow with ophthalmology for  diplopia, which  preceded diagnosis and intervention for the paraganglioma.  MRI brain from July 2024 did not demonstrate any pathology along optic pathways.   We ask that [name] return to clinic in 12 months following next cervical spine MRI, or sooner as needed.t  All questions were answered. The patient knows to call the clinic with any problems, questions or concerns. No barriers to learning were detected.  The total time spent in the encounter was 40 minutes and more than 50% was on counseling and review of test results   Arthea MARLA Manns, MD Medical Director of Neuro-Oncology [facility] 04/06/24 9:41 AM "

## 2024-06-20 ENCOUNTER — Inpatient Hospital Stay

## 2024-06-27 ENCOUNTER — Inpatient Hospital Stay: Admitting: Internal Medicine

## 2025-04-03 ENCOUNTER — Inpatient Hospital Stay: Attending: Physician Assistant | Admitting: Internal Medicine
